# Patient Record
Sex: Female | Born: 1950
Health system: Southern US, Community
[De-identification: ages and names within clinical notes are randomized; demographics above are authoritative.]

## PROBLEM LIST (undated history)

## (undated) DIAGNOSIS — Z923 Personal history of irradiation: Secondary | ICD-10-CM

## (undated) DIAGNOSIS — K8020 Calculus of gallbladder without cholecystitis without obstruction: Secondary | ICD-10-CM

## (undated) DIAGNOSIS — Z860100 Personal history of colon polyps, unspecified: Secondary | ICD-10-CM

## (undated) DIAGNOSIS — Z803 Family history of malignant neoplasm of breast: Secondary | ICD-10-CM

## (undated) DIAGNOSIS — D126 Benign neoplasm of colon, unspecified: Secondary | ICD-10-CM

## (undated) DIAGNOSIS — D333 Benign neoplasm of cranial nerves: Secondary | ICD-10-CM

## (undated) DIAGNOSIS — K222 Esophageal obstruction: Secondary | ICD-10-CM

## (undated) DIAGNOSIS — M1990 Unspecified osteoarthritis, unspecified site: Secondary | ICD-10-CM

## (undated) DIAGNOSIS — N390 Urinary tract infection, site not specified: Secondary | ICD-10-CM

## (undated) DIAGNOSIS — K449 Diaphragmatic hernia without obstruction or gangrene: Secondary | ICD-10-CM

## (undated) DIAGNOSIS — Z8041 Family history of malignant neoplasm of ovary: Secondary | ICD-10-CM

## (undated) DIAGNOSIS — C801 Malignant (primary) neoplasm, unspecified: Secondary | ICD-10-CM

## (undated) DIAGNOSIS — Z800 Family history of malignant neoplasm of digestive organs: Secondary | ICD-10-CM

## (undated) DIAGNOSIS — K219 Gastro-esophageal reflux disease without esophagitis: Secondary | ICD-10-CM

## (undated) DIAGNOSIS — C50919 Malignant neoplasm of unspecified site of unspecified female breast: Secondary | ICD-10-CM

## (undated) DIAGNOSIS — Z8042 Family history of malignant neoplasm of prostate: Secondary | ICD-10-CM

## (undated) HISTORY — DX: Family history of malignant neoplasm of digestive organs: Z80.0

## (undated) HISTORY — DX: Malignant neoplasm of unspecified site of unspecified female breast: C50.919

## (undated) HISTORY — DX: Family history of malignant neoplasm of ovary: Z80.41

## (undated) HISTORY — PX: TONSILLECTOMY: SUR1361

## (undated) HISTORY — DX: Personal history of colon polyps, unspecified: Z86.0100

## (undated) HISTORY — DX: Malignant (primary) neoplasm, unspecified: C80.1

## (undated) HISTORY — PX: OTHER SURGICAL HISTORY: SHX169

## (undated) HISTORY — DX: Unspecified osteoarthritis, unspecified site: M19.90

## (undated) HISTORY — PX: BREAST LUMPECTOMY: SHX2

## (undated) HISTORY — DX: Calculus of gallbladder without cholecystitis without obstruction: K80.20

## (undated) HISTORY — DX: Benign neoplasm of colon, unspecified: D12.6

## (undated) HISTORY — DX: Personal history of colonic polyps: Z86.010

## (undated) HISTORY — DX: Benign neoplasm of cranial nerves: D33.3

## (undated) HISTORY — DX: Esophageal obstruction: K22.2

## (undated) HISTORY — DX: Gastro-esophageal reflux disease without esophagitis: K21.9

## (undated) HISTORY — DX: Urinary tract infection, site not specified: N39.0

## (undated) HISTORY — DX: Family history of malignant neoplasm of breast: Z80.3

## (undated) HISTORY — DX: Family history of malignant neoplasm of prostate: Z80.42

## (undated) HISTORY — DX: Diaphragmatic hernia without obstruction or gangrene: K44.9

---
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
## 1994-05-25 DIAGNOSIS — C50919 Malignant neoplasm of unspecified site of unspecified female breast: Secondary | ICD-10-CM

## 1994-05-25 HISTORY — PX: PARTIAL HYSTERECTOMY: SHX80

## 1994-05-25 HISTORY — DX: Malignant neoplasm of unspecified site of unspecified female breast: C50.919

## 2000-01-19 ENCOUNTER — Other Ambulatory Visit: Admission: RE | Admit: 2000-01-19 | Discharge: 2000-01-19 | Payer: Self-pay | Admitting: Obstetrics and Gynecology

## 2001-01-19 ENCOUNTER — Other Ambulatory Visit: Admission: RE | Admit: 2001-01-19 | Discharge: 2001-01-19 | Payer: Self-pay | Admitting: Obstetrics and Gynecology

## 2001-05-05 ENCOUNTER — Encounter: Admission: RE | Admit: 2001-05-05 | Discharge: 2001-05-05 | Payer: Self-pay | Admitting: Obstetrics and Gynecology

## 2001-05-05 ENCOUNTER — Encounter: Payer: Self-pay | Admitting: Obstetrics and Gynecology

## 2002-04-14 ENCOUNTER — Other Ambulatory Visit: Admission: RE | Admit: 2002-04-14 | Discharge: 2002-04-14 | Payer: Self-pay | Admitting: Internal Medicine

## 2002-12-24 DIAGNOSIS — D126 Benign neoplasm of colon, unspecified: Secondary | ICD-10-CM

## 2002-12-24 HISTORY — DX: Benign neoplasm of colon, unspecified: D12.6

## 2003-01-22 ENCOUNTER — Encounter: Payer: Self-pay | Admitting: Gastroenterology

## 2003-05-26 HISTORY — PX: SHOULDER SURGERY: SHX246

## 2003-09-27 ENCOUNTER — Ambulatory Visit (HOSPITAL_BASED_OUTPATIENT_CLINIC_OR_DEPARTMENT_OTHER): Admission: RE | Admit: 2003-09-27 | Discharge: 2003-09-27 | Payer: Self-pay | Admitting: Orthopedic Surgery

## 2003-09-27 ENCOUNTER — Ambulatory Visit (HOSPITAL_COMMUNITY): Admission: RE | Admit: 2003-09-27 | Discharge: 2003-09-27 | Payer: Self-pay | Admitting: Orthopedic Surgery

## 2003-12-20 ENCOUNTER — Other Ambulatory Visit: Admission: RE | Admit: 2003-12-20 | Discharge: 2003-12-20 | Payer: Self-pay | Admitting: Internal Medicine

## 2005-04-15 ENCOUNTER — Ambulatory Visit (HOSPITAL_COMMUNITY): Admission: RE | Admit: 2005-04-15 | Discharge: 2005-04-15 | Payer: Self-pay | Admitting: General Surgery

## 2005-04-15 ENCOUNTER — Encounter (INDEPENDENT_AMBULATORY_CARE_PROVIDER_SITE_OTHER): Payer: Self-pay | Admitting: Specialist

## 2005-05-25 HISTORY — PX: HEMORRHOID SURGERY: SHX153

## 2006-01-06 ENCOUNTER — Other Ambulatory Visit: Admission: RE | Admit: 2006-01-06 | Discharge: 2006-01-06 | Payer: Self-pay | Admitting: Internal Medicine

## 2006-05-25 HISTORY — PX: CHOLECYSTECTOMY: SHX55

## 2006-09-01 ENCOUNTER — Ambulatory Visit (HOSPITAL_COMMUNITY): Admission: RE | Admit: 2006-09-01 | Discharge: 2006-09-01 | Payer: Self-pay | Admitting: Internal Medicine

## 2006-09-27 ENCOUNTER — Ambulatory Visit (HOSPITAL_COMMUNITY): Admission: RE | Admit: 2006-09-27 | Discharge: 2006-09-27 | Payer: Self-pay | Admitting: Surgery

## 2006-09-29 ENCOUNTER — Ambulatory Visit: Payer: Self-pay | Admitting: Gastroenterology

## 2006-10-04 ENCOUNTER — Ambulatory Visit: Payer: Self-pay | Admitting: Gastroenterology

## 2006-10-04 ENCOUNTER — Encounter (INDEPENDENT_AMBULATORY_CARE_PROVIDER_SITE_OTHER): Payer: Self-pay | Admitting: Specialist

## 2006-11-08 ENCOUNTER — Ambulatory Visit: Payer: Self-pay | Admitting: Gastroenterology

## 2006-12-16 ENCOUNTER — Ambulatory Visit (HOSPITAL_COMMUNITY): Admission: RE | Admit: 2006-12-16 | Discharge: 2006-12-16 | Payer: Self-pay | Admitting: Surgery

## 2006-12-16 ENCOUNTER — Encounter (INDEPENDENT_AMBULATORY_CARE_PROVIDER_SITE_OTHER): Payer: Self-pay | Admitting: Surgery

## 2007-10-07 DIAGNOSIS — C50412 Malignant neoplasm of upper-outer quadrant of left female breast: Secondary | ICD-10-CM

## 2007-10-07 DIAGNOSIS — K649 Unspecified hemorrhoids: Secondary | ICD-10-CM | POA: Insufficient documentation

## 2007-10-07 DIAGNOSIS — K2990 Gastroduodenitis, unspecified, without bleeding: Secondary | ICD-10-CM

## 2007-10-07 DIAGNOSIS — D126 Benign neoplasm of colon, unspecified: Secondary | ICD-10-CM | POA: Insufficient documentation

## 2007-10-07 DIAGNOSIS — K2970 Gastritis, unspecified, without bleeding: Secondary | ICD-10-CM | POA: Insufficient documentation

## 2007-10-07 DIAGNOSIS — K4090 Unilateral inguinal hernia, without obstruction or gangrene, not specified as recurrent: Secondary | ICD-10-CM | POA: Insufficient documentation

## 2007-10-07 DIAGNOSIS — K8020 Calculus of gallbladder without cholecystitis without obstruction: Secondary | ICD-10-CM | POA: Insufficient documentation

## 2007-10-07 DIAGNOSIS — K219 Gastro-esophageal reflux disease without esophagitis: Secondary | ICD-10-CM

## 2007-10-07 DIAGNOSIS — M129 Arthropathy, unspecified: Secondary | ICD-10-CM | POA: Insufficient documentation

## 2007-10-07 DIAGNOSIS — Z171 Estrogen receptor negative status [ER-]: Secondary | ICD-10-CM

## 2008-09-01 ENCOUNTER — Emergency Department (HOSPITAL_COMMUNITY): Admission: EM | Admit: 2008-09-01 | Discharge: 2008-09-01 | Payer: Self-pay | Admitting: Emergency Medicine

## 2010-06-15 ENCOUNTER — Encounter: Payer: Self-pay | Admitting: Radiology

## 2010-06-15 ENCOUNTER — Encounter: Payer: Self-pay | Admitting: Surgery

## 2010-10-07 NOTE — Assessment & Plan Note (Signed)
[facility]                         GASTROENTEROLOGY OFFICE NOTE   NAME:Stephanie Bautista, Stephanie Bautista                       MRN:          [id number]  DATE:11/08/2006                            DOB:          04-Dec-1950    Ms. Stephanie Bautista returns today noting some improvement in her reflux symptoms  but she does not feel her symptoms are under very good control.  She has  significant gas and bloating, this has been associated with flatulence  and intermittent belching.  She does note that the belching has however  improved.  She is still having chest pain which is being further  evaluated by her primary care physician, Dr. Marisue Brooklyn.   CURRENT MEDICATIONS:  1. Prevacid 30 mg q.a.m.  2. Ex-Lax p.r.n.  3. Gas-X t.i.d. p.r.n.  4. Tums t.i.d. p.r.n.   MEDICATION ALLERGIES:  None known.   EXAM:  No acute distress.  Weight 136.2 pounds, blood pressure 110/68,  pulse 60 and regular.  She is not re-examined.   ASSESSMENT AND PLAN:  1. Gastroesophageal reflux disease.  Symptoms only under fair control.      Re-intensify anti-reflux measures and increase Prevacid to 30 mg      p.o. b.i.d.  Return office visit in approximately 6 weeks.  2. Gas, bloating, flatulence and belching.  She is given instructions      on a low-gas diet.  Trial of Xifaxan 400 mg t.i.d. for 10 days.      Begin Align 1 daily x10 days.  She is advised to avoid carbonated      beverages and high-fiber foods which tend to exacerbate her      symptoms.  Return office visit in 6 weeks.  3. Cholelithiasis.  This is still a potential source of her symptoms      and if the above measures are not successful in controlling her      complaints, she will return to Dr. Michaell Cowing for consideration of      cholecystectomy.     Venita Lick. Russella Dar, MD, [facility]  Electronically Signed    MTS/MedQ  DD: 11/08/2006  DT: 11/08/2006  Job #: [id number]   cc:   Lovenia Kim, D.O.  Ardeth Sportsman, MD

## 2010-10-07 NOTE — Assessment & Plan Note (Signed)
[facility]                         GASTROENTEROLOGY OFFICE NOTE   NAME:SHUMATESophiah, Stephanie                       MRN:          [id number]  DATE:10/04/2006                            DOB:          02-02-1951    REFERRING PHYSICIAN:  Ardeth Sportsman, MD   REASON FOR REFERRAL:  Chest pain, belching, gas, bloating, and reflux  symptoms.   HISTORY OF PRESENT ILLNESS:  Stephanie Bautista is a 60 year old white female  who I have seen in the past.  She describes worsening chest pain when  she lies down.  The pain is located along her right back, right chest,  and right anterior chest wall, and it radiates to the mid sternal  region.  She has had ongoing problems with gas and bloating.  She notes  her symptoms are worsening and have not responded to Protonix.  She  notes no dysphagia, odynophagia, or change in bowel habits.  She has  mild chronic constipation that has been relatively stable.  She  underwent upper endoscopy previously in February, 2005 that was normal,  and she was treated for GERD at that time.  She had a colonoscopy in  August, 2004 that showed internal and external hemorrhoids as well as  small adenomatous polyps, which were removed.   She has recently been evaluated by Dr. Michaell Cowing, and an abdominal  ultrasound was performed on April 9th that showed cholelithiasis with no  other abnormalities.  The gastric emptying scan was performed on May 5th  that showed normal gastric emptying with 14.5% retention at 120 minutes  with normal being less than 30%.   PAST MEDICAL HISTORY:  1. Right breast cancer, status post lumpectomy.  2. Status post inguinal hernia repair in 1968.  3. Status post hemorrhoidectomy in 2007.  4. Status post partial hysterectomy in 1996.  5. Arthritis.  6. GERD.  7. Adenomatous colon polyps.   CURRENT MEDICATIONS:  1. Ex-Lax p.r.n.  2. Tums p.r.n.  3. Gas-X p.r.n.   MEDICATION ALLERGIES:  None known.   SOCIAL HISTORY:   Per the handwritten form.   REVIEW OF SYSTEMS:  Per the handwritten form.   PHYSICAL EXAMINATION:  GENERAL:  Well-developed and well-nourished white  female in no acute distress.  VITAL SIGNS:  Height 5 feet, 3-1/2 inches.  Weight 137.2 pounds.  Blood  pressure 126/70.  Pulse 72 and regular.  HEENT:  Anicteric sclerae.  Oropharynx clear.  CHEST:  Clear to auscultation with some chest wall tenderness located  along the right mid thoracic spine and the scapular area.  No anterior  chest wall tenderness.  Chest clear to auscultation bilaterally.  CARDIAC:  Regular rate and rhythm without murmurs.  ABDOMEN:  Soft, nontender, nondistended.  Normoactive bowel sounds.  No  palpable organomegaly, masses, or hernias.  NEUROLOGIC:  Alert and oriented x3.  Grossly nonfocal.   ASSESSMENT/PLAN:  1. Right back pain and right chest pain:  The features are not typical      for a gastrointestinal process.  I wonder whether she has thoracic      disk disease or another thoracic  spine disorder leading to      radicular pain or muscle spasms in this area.  Defer further      evaluation to Dr. Marisue Brooklyn.  2. Gas, bloating and reflux symptoms:  She is advised to begin      omeprazole 20 mg p.o. q.a.m. along with a standard antireflux diet      and a low gas diet.  She may use Gas-X and Tums p.r.n.  Consider a      trial of antibiotics for possible bacterial overgrowth.  Risks,      benefits and alternatives to upper endoscopy with possible biopsy      discussed with the patient, and she consents to proceed.  This will      be scheduled electively.  3. Cholelithiasis:  It is not entirely clear that her symptoms are      biliary; however, this may be causing some of her symptoms.      Further followup with Dr. Michaell Cowing for consideration of      cholecystectomy.  4. Personal history of adenomatous colon polyps.  Repeat colonoscopy      recommended for August, 2009.     Venita Lick. Russella Dar, MD, [facility]   Electronically Signed    MTS/MedQ  DD: 10/04/2006  DT: 10/05/2006  Job #: [id number]   cc:   Lovenia Kim, D.O.  Ardeth Sportsman, MD

## 2010-10-07 NOTE — Op Note (Signed)
NAMEEMMALENA, CANNY                ACCOUNT NO.:  [id number]   MEDICAL RECORD NO.:  [id number]          PATIENT TYPE:  AMB   LOCATION:  SDS                          FACILITY:  [facility]   PHYSICIAN:  Ardeth Sportsman, MD     DATE OF BIRTH:  08/09/1950   DATE OF PROCEDURE:  12/16/2006  DATE OF DISCHARGE:                               OPERATIVE REPORT   PRIMARY CARE PHYSICIAN:  Marisue Brooklyn, M.D.   GASTROENTEROLOGIST:  Venita Lick. Russella Dar, M.D.   SURGEON:  Karie Soda, M.D.   PREOPERATIVE DIAGNOSIS:  Abdominal pain with symptomatic  cholecystolithiasis.   POSTOPERATIVE DIAGNOSIS:  1. Cholecystolithiasis.  2. Chronic cholecystitis.   PROCEDURE PERFORMED:  Laparoscopic cholecystectomy with intraoperative  cholangiogram.   SURGEON:  Karie Soda, M.D.   ASSISTANT:  None.   ANESTHESIA:  1. General anesthesia.  2. Local anesthetic in a field block around all port sites.   SPECIMEN:  Gallbladder.   DRAINS:  None.   ESTIMATED BLOOD LOSS:  10 mL.   COMPLICATIONS:  None.   COMPLICATIONS:  None apparent.   INDICATIONS:  Ms. Tashiro is a 56-year female with chronic intermittent  abdominal pain which seems to be epigastric, radiating to her back.  She  has a known hiatal hernia but has had her reflux pretty well controlled  on proton pump inhibitors.  She had extensive gastroenterological and  surgical workup including a normal gastric emptying study  and normal  endoscopies with known gallstones.  Given the fact that she had atypical  symptoms, we did not rush the surgery, but after exhausting other  options, I thought it would be reasonable to pursue laparoscopic  cholecystectomy.   Anatomy and physiology of hepatobiliary and pancreatic function along  with foregut digestion  and antireflux mechanism was explained.  Pathophysiology of symptomatic cholecystolithiasis with its risks of  cholecystitis, gallstone pancreatitis, choledocholithiasis and prolonged  pain and  dehydration, etc., were discussed.  Options were discussed and  recommendations made for laparoscopic cholecystectomy with  intraoperative cholangiogram.  Risks such as stroke, heart attack and  deep venous thrombosis, pulmonary embolism and death were discussed.  Risks such as bleeding, need for transfusion, wound infection, abscess,  injury to other organs, prolonged pain, persistent pain (arguing that  the gallbladder is not the true etiology of pain) were discussed.  Risk  of bile duct injury resulting in need of operative reconstruction,  endoscopic stenting, percutaneous drainage, and other risks were  discussed.  Questions answered.  She agreed to proceed.   OPERATIVE FINDINGS:  She had a small shrunken gallbladder with  gallbladder wall thickening and omental and duodenal attachment  suspicious for cholecystitis.  Her cholangiogram was normal.   DESCRIPTION OF PROCEDURE:  Informed consent was confirmed.  The patient  urinated just prior to going to the operating room.  She underwent  general anesthesia without difficulty.  She was positioned supine, both  arms tucked.  She had sequential compression devices active during  entire case.  Her abdomen was prepped, draped in sterile fashion.   Entry was gained in the abdomen with the patient  in steep reversed  Trendelenburg and right-side up using optical entry with a 5-mm/0-degree  scope.  Capnoperitoneum 15 mmHg provided good abdominal insufflation.  Under direct visualization, 5-mm ports were placed in the right flank  and through the umbilicus.  A 10-mm port was tunneled through the  subxiphoid port.   Gallbladder fundus was grasped and elevated anteriorly.  Obvious  attachments received with omentum and duodenum.  These were freed off  using controlled cautery and especially just simple sharp dissection  with cold scissors to help free the duodenum off of the gallbladder.  This helped better expose the infundibulum.   Inspection of the duodenum  revealed no injury, and there was no injury to any other structures.  Gallbladder was able to be elevated more anteriorly to expose the  infundibulum.   The peritoneal coverings between the gallbladder and the liver were  freed off from the anteromedial posterior lateral aspects.  During  dissection, there was some breach into the gallbladder wall and spillage  of clear bile.  This was aspirated.  There was no spillage of stones.  Circumferential dissection was done to free the proximal half of the  gallbladder off the liver bed and dissection around the infundibulum.  Two structures were noted going from the gallbladder down to the porta  hepatis consistent the cystic artery and on the anteromedial wall and  the cystic duct leaving from the infundibulum consistent with a classic  critical view.  Two clips on the cystic artery and one clips slightly  proximal on the gallbladder side were done, and the cystic artery was  transected.  The cystic was further skeletonized.  A clip was placed on  the infundibulum.  A partial cyst ductotomy was done at the  infundibulum/cystic duct junction.  A 5-French cholangiocatheter was  placed through a right subxiphoid stab incision, flushed, and was able  to cannulate into this partial opening.  However, the catheter could not  be advanced.  Further dissection revealed what looked like a mid cystic  duct stenosis.  A partial cyst ductotomy was done just slightly proximal  to this and got clear bile back.  I was able to get the  cholangiocatheter to advance more proximally towards the common bile  duct.  It flushed well with no leak.   Cholangiogram was run using dilute radio-opaque contrast and continuous  fluoroscopy.  Contrast flowed from a side branch consistent with cystic  duct cannulization.  Contrast flowed well up into the right and left  intrahepatic chains across the common hepatic and bile duct, across a  smooth  ampule into normal duodenum consistent with a normal  cholangiogram.  Cholangiocatheter was removed.  Three clips were placed  on the cystic duct just proximal to the partial cyst ductotomy, and  cystic duct transection was completed.  Given the short stump, I placed  a 0 PDS Endoloop just slightly proximally to the third clips, staying  away from the cystic duct/common bile duct junction.  This brought good  cystic duct stump occlusion.   Gallbladder was freed from its remaining attachments on the liver bed.  There was a small venous bleeder on the gallbladder fossa, but this was  able to be controlled with focused cautery.  Gallbladder was placed  inside an EndoCatch bag and removed out the subxiphoid port with no  dilation required.  Copious irrigation was performed with clear return.  Inspection was made on the liver bed and on cystic duct and arterial  stumps.  There was no evidence of bleeding.  There was no bile leak.  Upper 3 abdominal ports were removed with no evidence of bleeding on the  skin or peritoneal side.  The fascial defect of the subxiphoid port was  too small to allow my pinky to pass, and I felt it did not require  anymore aggressive fascial closure.  Capnoperitoneum was evacuated.  Umbilical port was removed.  Skin was closed using 4-0 Monocryl stitch.  Sterile dressings applied.   The patient was extubated and sent to recovery room in stable condition.  I explained the operative findings to the patient's husband.  Questions  were answered, and he expressed understanding and appreciation.  I had  also discussed postoperative recovery issues with the patient just prior  to surgery as well.      Ardeth Sportsman, MD  Electronically Signed     SCG/MEDQ  D:  12/16/2006  T:  12/16/2006  Job:  [id number]   cc:   Lovenia Kim, D.O.  Venita Lick. Russella Dar, MD, [facility]  Ardeth Sportsman, MD

## 2010-10-10 NOTE — Op Note (Signed)
Stephanie Bautista, CAMPION                ACCOUNT NO.:  [id number]   MEDICAL RECORD NO.:  [id number]          PATIENT TYPE:  AMB   LOCATION:  DAY                          FACILITY:  [facility]   PHYSICIAN:  Leonie Man, M.D.   DATE OF BIRTH:  [birth year]   DATE OF PROCEDURE:  04/15/2005  DATE OF DISCHARGE:                                 OPERATIVE REPORT   PREOPERATIVE DIAGNOSIS:  Grade 3 and grade 4 hemorrhoidal disease.   POSTOPERATIVE DIAGNOSIS:  Grade 3 and grade 4 hemorrhoidal disease.   PROCEDURE:  Stapled hemorrhoidopexy.   SURGEON:  Leonie Man, MD.   ASSISTANT:  OR tech.   ANESTHESIA:  General.   SPECIMENS TO THE LAB:  Hemorrhoids.   ESTIMATED BLOOD LOSS:  Minimal.   COMPLICATIONS:  No complications on this operation and the patient to the  PACU in good condition.   HISTORY:  Note, [name] is a 60 year old woman presenting with recurrent  pain, bleeding and prolapse of her hemorrhoids which from time to time she  needs to reinsert into the anus and sometimes they go back on their home.  She has been having some bleeding and pain recently and comes now for  stapled hemorrhoidopexy for this. The risks and potential benefits of  surgery have been discussed with her, all questions answered and consent  obtained for surgery.   DESCRIPTION OF PROCEDURE:  Following the induction of satisfactory general  anesthesia, the patient is then turned into the prone jackknife position,  buttock cheeks pulled apart and held with tapes. The perianal tissues as  well as the vaginal vault are prepped and draped to be included in the  sterile operative field. I infiltrated the perianal tissues with 0.25%  Marcaine with epinephrine and Wydase. The anal sphincter was dilated up to  approximately 3 fingerbreadth's and operating anoscope placed inside the  anus. Approximately 5 cm above the dentate line, I placed a 2-0 Prolene  suture circumferentially around the mucosa of the anus and then  pulled the  pursestring suture tight so as to test the pursestring. A finger was  inserted in the vagina to make sure that the rectovaginal septum was not  included out. I inserted the hemorrhoidal stapler with the anvil above the  pursestring suture line and tied this down taut. The stapler was then closed  down to a size of 4 cm and then held for approximately 1 minute to assure  hemostasis. The stapler was then deployed. I held the stapler again in place  for an additional 30 seconds so as to assure hemostasis. The stapler was  then removed, a complete ring of mucosa above the hemorrhoidal pads were  removed in its entirety. The operating anoscope was again placed into the  anus and I hemostasis was assured with  electrocautery. I then used a Gelfoam roll to placed over the staple line,  removed with tapes and placed sterile dressings on the wound after sponge  and instrument counts were fully verified. The patient was then returned to  the PACU in excellent condition having tolerated the procedure well.  Leonie Man, M.D.  Electronically Signed     PB/MEDQ  D:  04/15/2005  T:  04/15/2005  Job:  [id number]

## 2010-10-10 NOTE — Op Note (Signed)
NAME:  Stephanie Bautista, Stephanie Bautista                          ACCOUNT NO.:  [id number]   MEDICAL RECORD NO.:  [id number]                   PATIENT TYPE:  AMB   LOCATION:  DSC                                  FACILITY:  [facility]   PHYSICIAN:  Loreta Ave, M.D.              DATE OF BIRTH:  06/03/1950   DATE OF PROCEDURE:  09/27/2003  DATE OF DISCHARGE:                                 OPERATIVE REPORT   PREOPERATIVE DIAGNOSIS:  Chronic impingement right shoulder with full  thickness tear rotator cuff, distal clavicle osteolysis chondromalacia.   POSTOPERATIVE DIAGNOSIS:  Chronic impingement right shoulder with full  thickness tear rotator cuff, distal clavicle osteolysis chondromalacia.   OPERATIVE PROCEDURE:  Right shoulder exam under anesthesia, arthroscopy,  debridement of superior labral tear, bursectomy, acromioplasty,  coracoacromial ligament release, excision distal clavicle, mini-open repair  rotator cuff tear with FiberWire suture and Concept repair system.   SURGEON:  Loreta Ave, M.D.   ASSISTANT:  Arlys John D. Petrarca, P.A.-C.   ANESTHESIA:  General.   ESTIMATED BLOOD LOSS:  Minimal.   SPECIMENS:  None.   CULTURES:  None.   COMPLICATIONS:  None.   DRESSINGS:  Soft compressive with shoulder immobilizer.   PROCEDURE:  The patient was brought to the operating room and after adequate  anesthesia had been obtained, the right shoulder was examined.  Full motion,  good stability.  She was placed in a beach chair position on the shoulder  positioner, prepped and draped in the usual sterile fashion.  Three standard  portals, anterior, posterior, and lateral.  The shoulder was entered with a  blunt obturator, distended, and inspected.  Roughening tearing anterior and  superior labrum debrided.  Biceps tendon well anchored.  Articular cartilage  and other ligamentous structures intact.  The under surface of the cuff was  thinned but I did not see a hole all the way through this.  The  cannula was  redirected subacromially.  Confirmation of functional full thickness tear  supraspinatus tendon extending from just behind the biceps almost all the  way back.  Marked attrition, roughening of the entire top of the cuff from  impingement.  The cuff debrided. I confirmed that there was a functional  full thickness tear requiring repair.  Type 2 acromion.  After resecting the  bursa and debriding the cuff, acromioplasty was achieved with shaver and  high speed bur releasing the CA ligament with cautery.  The distal clavicle  had spurs and grade 3 changes and some osteolysis.  A lateral cm was sharply  dissected.  Adequacy of decompression of the clavicle excision confirmed  viewing from all portals.  The instruments and fluids were removed.  The  lateral portal opened up into a deltoid splitting incision showing  subacromial space.  Adequacy of decompression confirmed.  The cuff was  debrided back to healthy tissue leaving a 5-6 mm gap at the area of  thinning  and full thickness tear.  The cuff was then mobilized and well captured with  two FiberWire sutures weaved into the cuff.  The tuberosity was roughened  and drill holes were placed into the tuberosity with the Concept Repair  System.  FiberWire sutures were weaved through drill holes and then firmly  tied down over a bony bridge and lateral cortex humerus.  This yielded a  nice, firm, watertight closure of the cuff without undue tension even  through full passive motion.  All impingement obliterated with previous  acromioplasty.  The wound was irrigated.  The deltoid was closed with  Vicryl.  The skin and subcutaneous tissue with Vicryl.  The portals were  closed with nylon.  The margins of the wound and the shoulder injected with  Marcaine.  A sterile compressive dressing and shoulder immobilizer applied.  Anesthesia reversed.  Brought to the recovery room.  Tolerated the surgery  well without complications.                                                Loreta Ave, M.D.    DFM/MEDQ  D:  09/27/2003  T:  09/27/2003  Job:  [id number]

## 2010-12-25 ENCOUNTER — Other Ambulatory Visit (HOSPITAL_COMMUNITY)
Admission: RE | Admit: 2010-12-25 | Discharge: 2010-12-25 | Disposition: A | Discharge status: Home / Self Care | Payer: 59 | Source: Ambulatory Visit | Attending: Internal Medicine | Admitting: Internal Medicine

## 2010-12-25 ENCOUNTER — Other Ambulatory Visit: Payer: Self-pay | Admitting: Internal Medicine

## 2010-12-25 DIAGNOSIS — Z01419 Encounter for gynecological examination (general) (routine) without abnormal findings: Secondary | ICD-10-CM | POA: Insufficient documentation

## 2011-03-09 LAB — CBC
MCV: 84.2
RBC: 4.71
WBC: 4.2

## 2011-04-01 ENCOUNTER — Encounter: Payer: Self-pay | Admitting: Gastroenterology

## 2011-04-01 ENCOUNTER — Telehealth: Payer: Self-pay

## 2011-04-01 NOTE — Telephone Encounter (Signed)
Patient hasn't been seen in the office since 2008.  She is requesting a rx for nexium that she has been on in the past.  She is having an increase in reflux and requesting a rx.  I have advised that she can try prilosec OTC and have moved up her appt with Dr Russella Dar to 04/13/11 from 04/21/11.

## 2011-04-13 ENCOUNTER — Encounter: Payer: Self-pay | Admitting: Gastroenterology

## 2011-04-13 ENCOUNTER — Ambulatory Visit (INDEPENDENT_AMBULATORY_CARE_PROVIDER_SITE_OTHER): Payer: 59 | Admitting: Gastroenterology

## 2011-04-13 VITALS — BP 110/80 | HR 82 | Ht 63.0 in | Wt 136.0 lb

## 2011-04-13 DIAGNOSIS — Z86010 Personal history of colonic polyps: Secondary | ICD-10-CM

## 2011-04-13 DIAGNOSIS — K219 Gastro-esophageal reflux disease without esophagitis: Secondary | ICD-10-CM

## 2011-04-13 MED ORDER — OMEPRAZOLE 20 MG PO CPDR: 20.0000 mg | DELAYED_RELEASE_CAPSULE | Freq: Every day | ORAL | Status: DC

## 2011-04-13 NOTE — Patient Instructions (Addendum)
Your prescription for Omeprazole has been sent to your pharmacy.  Please call back in December to schedule your recall Colonoscopy for January 2013. Patient advised to avoid spicy, acidic, citrus, chocolate, mints, fruit and fruit juices.  Limit the intake of caffeine, alcohol and Soda.  Don't exercise too soon after eating.  Don't lie down within 3-4 hours of eating.  Elevate the head of your bed.

## 2011-04-13 NOTE — Progress Notes (Signed)
History of Present Illness: This is a 60 year old female who has a history of recurrent reflux problems and has undergone upper endoscopies in 2002, 2005 and 2008. She came off recommended daily PPIs and her reflux symptoms were not bothersome for the past 2 years. About 3 weeks ago she developed significant problems with heartburn, belching, regurgitation, epigastric pain and chest pain on a daily basis. She began taking Prilosec over-the-counter and her symptoms have completely resolved. She also relates difficulties with a nocturnal chest and back pain that has been problematic for the past several months. These symptoms have improved since starting omeprazole. Denies weight loss, constipation, diarrhea, change in stool caliber, melena, hematochezia, nausea, vomiting, reflux symptoms, chest pain.  Review of Systems: Pertinent positive and negative review of systems were noted in the above HPI section. All other review of systems were otherwise negative.  Current Medications, Allergies, Past Medical History, Past Surgical History, Family History and Social History were reviewed in [employer] record.  Physical Exam: General: Well developed , well nourished, no acute distress Head: Normocephalic and atraumatic Eyes:  sclerae anicteric, EOMI Ears: Normal auditory acuity Mouth: No deformity or lesions Neck: Supple, no masses or thyromegaly Lungs: Clear throughout to auscultation Heart: Regular rate and rhythm; no murmurs, rubs or bruits Abdomen: Soft, non tender and non distended. No masses, hepatosplenomegaly or hernias noted. Normal Bowel sounds Rectal: deferred to colonoscopy Musculoskeletal: Symmetrical with no gross deformities  Skin: No lesions on visible extremities Pulses:  Normal pulses noted Extremities: No clubbing, cyanosis, edema or deformities noted Neurological: Alert oriented x 4, grossly nonfocal Cervical Nodes:  No significant cervical  adenopathy Inguinal Nodes: No significant inguinal adenopathy Psychological:  Alert and cooperative. Normal mood and affect  Assessment and Recommendations:  1. GERD. Continue standard antireflux measures and begin the use of 4 inch bed blocks. Continue omeprazole 20 mg daily for the long-term.  2. Personal history of adenomatous colon polyps, 2004. She did not return for her recommended recall colonoscopy in 2009. She is agreeable to proceeding with colonoscopy but would like to do so in January. The risks, benefits, and alternatives to colonoscopy with possible biopsy and possible polypectomy were discussed with the patient and they consent to proceed.

## 2011-04-15 ENCOUNTER — Encounter: Payer: Self-pay | Admitting: Gastroenterology

## 2011-04-15 ENCOUNTER — Encounter: Payer: Self-pay | Admitting: Internal Medicine

## 2011-04-21 ENCOUNTER — Ambulatory Visit: Payer: 59 | Admitting: Gastroenterology

## 2011-08-12 ENCOUNTER — Ambulatory Visit (INDEPENDENT_AMBULATORY_CARE_PROVIDER_SITE_OTHER): Payer: 59 | Admitting: Internal Medicine

## 2011-08-12 VITALS — BP 138/76 | HR 77 | Temp 98.0°F | Resp 16 | Ht 62.5 in | Wt 125.4 lb

## 2011-08-12 DIAGNOSIS — H905 Unspecified sensorineural hearing loss: Secondary | ICD-10-CM | POA: Insufficient documentation

## 2011-08-12 DIAGNOSIS — H9319 Tinnitus, unspecified ear: Secondary | ICD-10-CM

## 2011-08-12 DIAGNOSIS — IMO0002 Reserved for concepts with insufficient information to code with codable children: Secondary | ICD-10-CM

## 2011-08-12 DIAGNOSIS — H9312 Tinnitus, left ear: Secondary | ICD-10-CM

## 2011-08-12 MED ORDER — PREDNISONE 50 MG PO TABS: ORAL_TABLET | ORAL | Status: AC

## 2011-08-12 NOTE — Assessment & Plan Note (Signed)
Audiometry revealed loss of mid and high frequencies.   Plan to treat with Prednisone and referral to ENT in 24 - 48 hours.

## 2011-08-12 NOTE — Assessment & Plan Note (Signed)
Damaged into nailbed, possibly into nail matrix.  Nothing to remove here in clinic. Recommended to keep toenail covered/gandaged as she continues to injury it when it is unencumbered.   If refuses to heal or starts having problems with other nails, she is to return to clinic in 1 month or so.

## 2011-08-12 NOTE — Assessment & Plan Note (Signed)
Audiometry in clinic revealed:  Possibly eustachian tube dysfunction.

## 2011-08-12 NOTE — Patient Instructions (Addendum)
We are referring you to Ear Nose and Throat Doctor tomorrow.   Take the Prednisone 1 pill daily for the next 7 days.   Keep your nail covered.  Hearing Loss A hearing loss is sometimes called deafness. Hearing loss may be partial or total. CAUSES Hearing loss may be caused by:  Wax in the ear canal.   Infection of the ear canal.   Infection of the middle ear.   Trauma to the ear or surrounding area.   Fluid in the middle ear.   A hole in the eardrum (perforated eardrum).   Exposure to loud sounds or music.   Problems with the hearing nerve.   Certain medications.  Hearing loss without wax, infection, or a history of injury may mean that the nerve is involved. Hearing loss with severe dizziness, nausea and vomiting or ringing in the ear may suggest a hearing nerve irritation or problems in the middle or inner ear. If hearing loss is untreated, there is a greater likelihood for residual or permanent hearing loss. DIAGNOSIS A hearing test (audiometry) assesses hearing loss. The audiometry test needs to be performed by a hearing specialist (audiologist). TREATMENT Treatment for recent onset of hearing loss may include:  Ear wax removal.   Medications that kill germs (antibiotics).   Cortisone medications.   Prompt follow up with the appropriate specialist.  Return of hearing depends on the cause of your hearing loss, so proper medical follow-up is important. Some hearing loss may not be reversible, and a caregiver should discuss care and treatment options with you. SEEK MEDICAL CARE IF:   You have a severe headache, dizziness, or changes in vision.   You have new or increased weakness.   You develop repeated vomiting or other serious medical problems.   You have a fever.  Document Released: 05/11/2005 Document Revised: 04/30/2011 Document Reviewed: 09/05/2009 [facility] Patient Information 2012 [place], [place].

## 2011-08-12 NOTE — Progress Notes (Signed)
  Subjective:    Patient ID: Roderick B Wiland, female    DOB: 1950/06/30, 61 y.o.   MRN: [id number]  HPI 1.  Left sided ear fullness:  Started acutely Monday, not sure exact time. Persistent since that time.  Describes "fullness" such as when "my ears want to pop but cannot."  Also describes tinnitus in that ear described as "buzzing" sound.  No actual ear pain.  Notes that evening that she had loss of hearing when she is lying on Right side, could not hear her husband talking.  She has had no recent URI or other illnesses.  Does state she had had gradual decrease in hearing for past 5 - 8 years, but believes this to be equal and symmetric.  No dizziness, no falls, no pre-syncopal or syncopal symptoms.  No nausea or vomiting, no vertiginous symptoms, no lightheadedness.    2.  Left 5th toenail injury:  Split left fifth toenail several weeks ago.  Unsure exactly when, no exact inciting injury.  Split deep into the quick.  She has been filing nail down to help with this.  Aggravated when putting on socks or when toenails become snagged on carpet.  No other nail problems on feet or hands.     Review of Systems No fevers or chills.  No vestibular symptoms.      Objective:   Physical Exam  BP 138/76  Pulse 77  Temp(Src) 98 F (36.7 C) (Oral)  Resp 16  Ht 5' 2.5" (1.588 m)  Wt 125 lb 6.4 oz (56.881 kg)  BMI 22.57 kg/m2 Gen:  Patient sitting on exam table, appears stated age in no acute distress Head: Normocephalic atraumatic Eyes: EOMI, PERRL, sclera and conjunctiva non-erythematous Nose:  Nasal turbinates non-erythematous and non-edemaouts BL Ears:  External pinna WNL BL.  Bilateral ear canals clear with mild cerumen and no erythema or swelling.  BL TM's pearly gray with good landmarks beyond.  No TM erythema BL.   Mouth: Mucosa membranes moist. Tonsils +2, nonenlarged, non-erythematous. Neck: No cervical lymphadenopathy noted Heart:  RRR, no murmurs auscultated. Pulm:  Clear to auscultation  bilaterally with good air movement.  No wheezes or rales noted.   Toes:  Let 5th toe with hairline split in nailbed extending into base of nail on lateral aspect of nail.  No erythema.  She has filed this down to only 3-4 mm about nail matrix.  There is nothing for [place] to clip or remove.       Assessment & Plan:

## 2011-11-16 ENCOUNTER — Ambulatory Visit (INDEPENDENT_AMBULATORY_CARE_PROVIDER_SITE_OTHER): Payer: 59 | Admitting: Family Medicine

## 2011-11-16 ENCOUNTER — Ambulatory Visit
Admission: RE | Admit: 2011-11-16 | Discharge: 2011-11-16 | Disposition: A | Discharge status: Home / Self Care | Payer: 59 | Source: Ambulatory Visit | Attending: Family Medicine | Admitting: Family Medicine

## 2011-11-16 VITALS — BP 140/70 | HR 63 | Temp 98.1°F | Resp 16 | Ht 63.5 in | Wt 118.4 lb

## 2011-11-16 DIAGNOSIS — R42 Dizziness and giddiness: Secondary | ICD-10-CM

## 2011-11-16 DIAGNOSIS — R110 Nausea: Secondary | ICD-10-CM

## 2011-11-16 LAB — POCT CBC
HCT, POC: 42.9 % (ref 37.7–47.9)
Hemoglobin: 14.3 g/dL (ref 12.2–16.2)
Lymph, poc: 1.3 (ref 0.6–3.4)
MCHC: 33.3 g/dL (ref 31.8–35.4)
MCV: 87.1 fL (ref 80–97)
POC LYMPH PERCENT: 14.8 %L (ref 10–50)
RDW, POC: 12.9 %
WBC: 8.8 10*3/uL (ref 4.6–10.2)

## 2011-11-16 LAB — BASIC METABOLIC PANEL
Calcium: 9.5 mg/dL (ref 8.4–10.5)
Sodium: 139 mEq/L (ref 135–145)

## 2011-11-16 LAB — TSH: TSH: 1.036 u[IU]/mL (ref 0.350–4.500)

## 2011-11-16 MED ORDER — ONDANSETRON 4 MG PO TBDP
4.0000 mg | ORAL_TABLET | Freq: Once | ORAL | Status: AC
  Administered 2011-11-16: 4 mg via ORAL

## 2011-11-16 MED ORDER — DIAZEPAM 5 MG PO TABS: 5.0000 mg | ORAL_TABLET | Freq: Every evening | ORAL | Status: AC | PRN

## 2011-11-16 MED ORDER — MECLIZINE HCL 25 MG PO TABS: 25.0000 mg | ORAL_TABLET | Freq: Three times a day (TID) | ORAL | Status: AC | PRN

## 2011-11-16 MED ORDER — ONDANSETRON 4 MG PO TBDP: 4.0000 mg | ORAL_TABLET | Freq: Three times a day (TID) | ORAL | Status: AC | PRN

## 2011-11-16 NOTE — Patient Instructions (Signed)
We will schedule a ct scan today, and you should receive a call about the results today.  Meclizine every 8 hours as needed, zofran up to every 8 hours as needed for nausea, and valium at night if needed for vertigo. If your symptoms are not improving in the next few days - return for recheck. Return to the clinic or go to the nearest emergency room if any of your symptoms worsen or new symptoms occur. Your should receive a call or letter about your lab results within the next week to 10 days.

## 2011-11-16 NOTE — Progress Notes (Signed)
Subjective:    Patient ID: Marcella B Demartin, female    DOB: 06/02/1950, 61 y.o.   MRN: [id number]  HPI Sierria B Elbe is a 61 y.o. female Woke up dizzy - 4 days ago.  Took meclizine - wore off by lunchtime.  Notes when lying down, then sitting up - continued past few days - seemed to be better by yesterday, but worse this am.  Feels like room spinning. Nausea today.  Worse with turning to left.  No vomiting.  No arm or leg weakness, no face weakness, no slurred speech. Slight frontal headache, no new chest pain (has pain in chest with lying flat, lumpectomy and radiation for breast CA in 1996), no palpitations.  Hx of vertigo - years ago.  Had hearing sensitivity then - seen by ENT, had some hearing loss.    Recently put on estrogen by gynecologist - 6 days ago, but stopped after 1 dose.    FH: father with CVA in 70's, but diabetic.   Tx: meclizine 1 per day past few days.   Review of Systems  Respiratory: Negative for chest tightness and shortness of breath.   Neurological: Positive for dizziness and headaches. Negative for syncope, facial asymmetry, speech difficulty and weakness.       Objective:   Physical Exam  Constitutional: She is oriented to person, place, and time. She appears well-developed and well-nourished.  HENT:  Head: Normocephalic and atraumatic.  Right Ear: External ear normal.  Left Ear: External ear normal.  Mouth/Throat: Oropharynx is clear and moist. No oropharyngeal exudate.  Eyes: Pupils are equal, round, and reactive to light. Right eye exhibits nystagmus. Left eye exhibits nystagmus.  Neck: JVD present.  Cardiovascular: Normal rate, regular rhythm, normal heart sounds and intact distal pulses.   No murmur heard. Pulmonary/Chest: Effort normal and breath sounds normal.  Neurological: She is alert and oriented to person, place, and time. She has normal strength. She is not disoriented. She displays no tremor. No cranial nerve deficit or sensory deficit. She  exhibits normal muscle tone. Gait abnormal.       Unsteady with romberg, and heel to toe - unable to balance.   Skin: Skin is warm and dry. No rash noted.  Psychiatric: She has a normal mood and affect. Her behavior is normal.  Blood pressure 140/70, pulse 63, temperature 98.1 F (36.7 C), temperature source Oral, resp. rate 16, height 5' 3.5" (1.613 m), weight 118 lb 6.4 oz (53.706 kg).  EKG : Sr, no acute findings.  Results for orders placed in visit on 11/16/11  POCT CBC      Component Value Range   WBC 8.8  4.6 - 10.2 K/uL   Lymph, poc 1.3  0.6 - 3.4   POC LYMPH PERCENT 14.8  10 - 50 %L   MID (cbc) 0.3  0 - 0.9   POC MID % 3.3  0 - 12 %M   POC Granulocyte 7.2 (*) 2 - 6.9   Granulocyte percent 81.9 (*) 37 - 80 %G   RBC 4.93  4.04 - 5.48 M/uL   Hemoglobin 14.3  12.2 - 16.2 g/dL   HCT, POC 40.9  81.1 - 47.9 %   MCV 87.1  80 - 97 fL   MCH, POC 29.0  27 - 31.2 pg   MCHC 33.3  31.8 - 35.4 g/dL   RDW, POC 91.4     Platelet Count, POC 212  142 - 424 K/uL   MPV 10.0  0 -  99.8 fL       Assessment & Plan:  Melessia B Reader is a 61 y.o. female 1. Dizziness  Basic metabolic panel, TSH, POCT CBC, EKG 12-Lead  2. Vertigo  Basic metabolic panel, TSH, POCT CBC, EKG 12-Lead  3. Nausea  ondansetron (ZOFRAN-ODT) disintegrating tablet 4 mg  4. Frontal headache      Suspected recurrence of vertigo. No foacal wekaness, but with worsening today - check CT head without contrast today.  If negative - continue meclizine - every 8 hours as needed, add zofran 4mg  Q 8hrs prn nausea,  Valium 5mg  qhs prn., push fluids, and recheck if not improved in next few days.  Consider neuro eval or MRI if sx's not clearing. Check BMP, TSH.   1700 addendum. CT witout any acute findings - left message on both contact numbers for results.  Sx care and rtc precautions as above.

## 2011-11-19 ENCOUNTER — Telehealth: Payer: Self-pay | Admitting: Radiology

## 2011-11-19 ENCOUNTER — Telehealth (INDEPENDENT_AMBULATORY_CARE_PROVIDER_SITE_OTHER): Payer: Self-pay | Admitting: General Surgery

## 2011-11-19 NOTE — Telephone Encounter (Signed)
Message copied by Luretha Murphy on Thu Nov 19, 2011  9:41 AM ------      Message from: [place], [place] R      Created: Mon Nov 16, 2011  5:17 PM       Left message to call back:      When calls - advise that head CT negative/ no acute abnormalities. Can take meds as discussed in office, follow up if not improving next few days.

## 2011-11-19 NOTE — Telephone Encounter (Signed)
Dr.Neal calling for Dr. Jamey Ripa, who is on vacation.  He is requesting records for this pt---of path report and any notes on her therapy.  Records at in storage, so requisitioned to be brought in to office.  Called Dr. Donnetta Hail nurse, Selena Batten, to let her know this and will try to FAX her notes when they are available (FAX: [phone].)  She will let Dr. Jennette Kettle know.

## 2011-11-21 NOTE — Telephone Encounter (Signed)
Agree with rtc advice.

## 2011-11-21 NOTE — Telephone Encounter (Signed)
LMOM advising patient RTC if still dizzy.

## 2011-11-25 ENCOUNTER — Ambulatory Visit (INDEPENDENT_AMBULATORY_CARE_PROVIDER_SITE_OTHER): Payer: 59 | Admitting: Family Medicine

## 2011-11-25 ENCOUNTER — Encounter: Payer: Self-pay | Admitting: Family Medicine

## 2011-11-25 VITALS — BP 114/70 | HR 80 | Temp 98.6°F | Resp 16 | Ht 62.5 in | Wt 117.8 lb

## 2011-11-25 DIAGNOSIS — H9319 Tinnitus, unspecified ear: Secondary | ICD-10-CM

## 2011-11-25 DIAGNOSIS — H538 Other visual disturbances: Secondary | ICD-10-CM

## 2011-11-25 DIAGNOSIS — R42 Dizziness and giddiness: Secondary | ICD-10-CM

## 2011-11-25 DIAGNOSIS — H9190 Unspecified hearing loss, unspecified ear: Secondary | ICD-10-CM

## 2011-11-25 NOTE — Progress Notes (Signed)
Subjective:    Patient ID: Stephanie Bautista, female    DOB: 1950/12/28, 60 y.o.   MRN: [id number]  Stephanie Bautista is a 61 y.o. female See 11/19/11 OV - Woke up dizzy - 4 days prior. Worse that am.  Felt  like room spinning. Nausea.  Worse with turning to left.  No vomiting.  No arm or leg weakness, no face weakness, no slurred speech. Slight frontal headache, no new chest pain (has pain in chest with lying flat, lumpectomy and radiation for breast CA in 1996), no palpitations. Hx of vertigo - years ago.  Had  hearing sensitivity with some loss 6 months ago seen by ENT then. had some hearing loss.  Suspected recurrence of vertigo. CT without any acute findings. Continued meclizine - every 8 hours as needed, add zofran 4mg  Q 8hrs prn nausea,  Valium 5mg  qhs prn, push fluids. Chronic ringing in ears   Still waking up dizzy after lying flat - noticing more first thing in am.  Improves by 10 or 11, and better in afternoon, unless turning or changing positions quickly.  Last meclizine yesterday - just in am.  Took 1 valium, no improvement.  Tried xanax next night - no relief.  Hydrocodone - took 1/2 pill few nights ago - helped symptoms some.  Less dizzy in am.    Vision blurry and light sensitive at times with dizziness.  Initially felt like room and herself moving - now feels like she is moving.  Vision improves as day goes on. Eyes change frequently - new rx in May. Has been changing Rx every 6 months. Usually blurry vision with vertigo symptoms, current visio symptoms same as prior vertigo symptoms.     drinking 4-5 glasses water per day. Clear to yellow urine.    60% better, but still there.   Review of Systems  Constitutional: Negative for fever and chills.  HENT: Positive for hearing loss and tinnitus. Negative for ear pain.   Eyes: Negative for photophobia.  Neurological: Positive for dizziness. Negative for weakness.       Objective:   Physical Exam  Constitutional: She is oriented to  person, place, and time. She appears well-developed and well-nourished.  HENT:  Head: Normocephalic and atraumatic.  Neck: Carotid bruit is not present.  Cardiovascular: Normal rate, regular rhythm, normal heart sounds and intact distal pulses.   No murmur heard. Pulmonary/Chest: Effort normal.  Musculoskeletal: Normal range of motion.  Neurological: She is alert and oriented to person, place, and time. She has normal strength. She displays no tremor. No cranial nerve deficit or sensory deficit. She displays a negative Romberg sign. Coordination and gait normal.       Heel to toe wnl.  Slight shift with romberg, but self corrects. Normal finger to nose testing.    Skin: Skin is warm and dry.  Psychiatric: She has a normal mood and affect. Her behavior is normal. Judgment and thought content normal.       Assessment & Plan:  Stephanie Bautista is a 61 y.o. female 1. Vertigo   2. Hearing difficulty   3. Tinnitus   4. Blurry vision     With hearing loss prior, tinnitus, and vertigo - DDX includes Meniere's disease. Overall improved - nonfocal exam reassuring.  Cont meclizine, increase fluid intake.  Call ENT to schedule appt in next 2 weeks if possible to discuss possible Meniere's. Discussed MRI to look at posterior brain, with blurry vision, but per patient visual  symptoms typical of vertigo symptoms.  Patient Instructions  Call ENT to schedule appointment. With the vertigo, hearing loss and ringing in the ears - ask them if Meniere's disease is a possibility for your symptoms, and if they would recommend any additional treatment.  Continue meclizine - up to 3 times per day, and if any new or worsening symptoms - return to clinic or be evaluated at an emergency room.       Visual change/blurry symptoms - ?related to vertigo.  CT negative prior.

## 2011-11-25 NOTE — Patient Instructions (Signed)
Call ENT to schedule appointment. With the vertigo, hearing loss and ringing in the ears - ask them if Meniere's disease is a possibility for your symptoms, and if they would recommend any additional treatment.  Continue meclizine - up to 3 times per day, and if any new or worsening symptoms - return to clinic or be evaluated at an emergency room.

## 2012-02-29 ENCOUNTER — Encounter: Payer: Self-pay | Admitting: Gastroenterology

## 2012-05-05 ENCOUNTER — Other Ambulatory Visit: Payer: Self-pay | Admitting: Dermatology

## 2012-07-06 ENCOUNTER — Other Ambulatory Visit: Payer: Self-pay | Admitting: Otolaryngology

## 2012-07-06 DIAGNOSIS — H933X9 Disorders of unspecified acoustic nerve: Secondary | ICD-10-CM

## 2012-07-11 ENCOUNTER — Other Ambulatory Visit: Payer: 59

## 2012-07-11 ENCOUNTER — Ambulatory Visit
Admission: RE | Admit: 2012-07-11 | Discharge: 2012-07-11 | Disposition: A | Discharge status: Home / Self Care | Payer: 59 | Source: Ambulatory Visit | Attending: Otolaryngology | Admitting: Otolaryngology

## 2012-07-11 DIAGNOSIS — H933X9 Disorders of unspecified acoustic nerve: Secondary | ICD-10-CM

## 2012-07-11 MED ORDER — GADOBENATE DIMEGLUMINE 529 MG/ML IV SOLN
12.0000 mL | Freq: Once | INTRAVENOUS | Status: AC | PRN
  Administered 2012-07-11: 12 mL via INTRAVENOUS

## 2012-07-12 ENCOUNTER — Other Ambulatory Visit: Payer: 59

## 2012-12-06 ENCOUNTER — Encounter (INDEPENDENT_AMBULATORY_CARE_PROVIDER_SITE_OTHER): Payer: Self-pay

## 2013-10-10 ENCOUNTER — Ambulatory Visit (INDEPENDENT_AMBULATORY_CARE_PROVIDER_SITE_OTHER): Payer: 59 | Admitting: Physician Assistant

## 2013-10-10 VITALS — BP 132/80 | HR 69 | Temp 98.0°F | Resp 16 | Ht 63.5 in | Wt 149.0 lb

## 2013-10-10 DIAGNOSIS — R229 Localized swelling, mass and lump, unspecified: Secondary | ICD-10-CM

## 2013-10-10 DIAGNOSIS — M79641 Pain in right hand: Secondary | ICD-10-CM

## 2013-10-10 DIAGNOSIS — R2231 Localized swelling, mass and lump, right upper limb: Secondary | ICD-10-CM

## 2013-10-10 DIAGNOSIS — M79672 Pain in left foot: Secondary | ICD-10-CM

## 2013-10-10 DIAGNOSIS — M79609 Pain in unspecified limb: Secondary | ICD-10-CM

## 2013-10-10 MED ORDER — MELOXICAM 15 MG PO TABS: 15.0000 mg | ORAL_TABLET | Freq: Every day | ORAL | Status: DC

## 2013-10-10 NOTE — Progress Notes (Signed)
Subjective:    Patient ID: Stephanie Bautista, female    DOB: 1951-03-05, 63 y.o.   MRN: [id number]  HPI   Stephanie Bautista is a very pleasant 63 yr old female here with several concerns, unsure if they are related.  (1)  Has noted a knot on the palmar aspect of her distal RIGHT thumb.  This has been present from probably 2 months.  It is tender to the touch.  She has pain when she reaches for things, grips things.  She has never had a nodule like this.  No other involved digits.  No known trauma to the thumb.  She is right hand dominant.  In addition, she has beginning to have pain at the volar aspect of her RIGHT forearm as well.  Pain seems to be exacerbated by pronation/supination.  No known injury to the arm.  She denies numbness or tingling.  Endorses some weakness in the RIGHT hand when gripping, but also thinks this may just be due to pain.    (2)  Additionally she has pain in the ball of her LEFT foot.  For awhile it felt like when she walked that she was stepping on a metal plate.  Now she is having pain.  With palpation yesterday she found a very painful knot near the base of the 3rd toe.  Has never had anything like this before.  No pain at rest, but pain with walking.  She does have legs aches - esp at night - this has been present for awhile now.    She is otherwise feeling well.  Has used Tylenol and Advil for pain relief.     Review of Systems  Constitutional: Negative for fever and chills.  Respiratory: Negative.   Cardiovascular: Negative.   Gastrointestinal: Negative.   Musculoskeletal: Positive for arthralgias.  Skin: Negative for color change and wound.  Neurological: Negative for numbness.       Objective:   Physical Exam  Vitals reviewed. Constitutional: She is oriented to person, place, and time. She appears well-developed and well-nourished. No distress.  HENT:  Head: Normocephalic and atraumatic.  Eyes: Conjunctivae are normal. No scleral icterus.  Cardiovascular:  Intact distal pulses.   Pulses:      Radial pulses are 2+ on the right side, and 2+ on the left side.       Dorsalis pedis pulses are 1+ on the right side, and 1+ on the left side.       Posterior tibial pulses are 2+ on the right side, and 2+ on the left side.  Pulmonary/Chest: Effort normal.  Musculoskeletal:       Right forearm: She exhibits tenderness. She exhibits no swelling and no deformity.       Right hand: She exhibits tenderness. She exhibits normal range of motion and normal capillary refill. Normal sensation noted. Normal strength noted.       Hands:      Left foot: She exhibits tenderness. She exhibits normal range of motion, no bony tenderness, no swelling, normal capillary refill and no deformity.  Subcutaneous nodule at palmar aspect of distal phalanx of RIGHT thumb; tender to palpation; full ROM; cap refill normal  Tenderness along volar aspect of RIGHT forearm; no bruising, swelling, deformity; increased pain with pronation and supination; full ROM at the wrist and elbow; finklestein's negative  Tenderness at plantar aspect of LEFT foot between 3rd and 4th metatarsals; difficult to appreciate if there is a nodule in this location  Neurological:  She is alert and oriented to person, place, and time. She has normal reflexes.  Skin: Skin is warm and dry.  Psychiatric: She has a normal mood and affect. Her behavior is normal.        Assessment & Plan:  Stephanie Bautista is a very pleasant 63 yr old female here with 1. Nodule of finger of right hand Nodule at palmar aspect of RIGHT thumb.  Present for about 2 months now, increasing in tenderness with no sign of improvement.  I am not sure of the source of this nodule - possible that it is part of the flexor tendon?  Given the location, will refer to hand surg for further evaluation of this.    - Ambulatory referral to Hand Surgery  2. Right hand pain  - Ambulatory referral to Hand Surgery - meloxicam (MOBIC) 15 MG tablet; Take  1 tablet (15 mg total) by mouth daily.  Dispense: 30 tablet; Refill: 1  3. Left foot pain Unclear if this may be related to the nodule at her thumb, but suspect Morton's neuroma given the location and quality of pain.  When I discuss this with pt she recalls that probably 40 yrs ago she may have been told she had this.  Additionally she reports that she has an acoustic neuroma and wonders if these may be related.  I am doubtful that there is correlation between as Morton's tend to be due to trauma, but I suppose something like neurofibromatosis could cause something like this - though she has no other manifestations of disease.  Will start with conservative treatment - metatarsal pads and NSAIDs.  Wear supportive shoes.  Ice when possible.  If no improvement in the next week or so would consider U/S or referral to podiatry or ortho.  Pt understands and agrees with this plan.  - meloxicam (MOBIC) 15 MG tablet; Take 1 tablet (15 mg total) by mouth daily.  Dispense: 30 tablet; Refill: 1  Pt to call or RTC if worsening or not improving  E. Natividad Brood MHS, PA-C [facility] 5/19/201511:07 AM

## 2013-10-10 NOTE — Patient Instructions (Signed)
I have referred you to see then hand specialist for the nodule at your thumb  In the mean time, take the meloxicam (Mobic) once daily to help with pain and inflammation.  Do not take any additional ibuprofen or Aleve as these medicines work the same way.  You can take Tylenol if needed for additional pain relief  For your foot, I wonder if you have a Morton neuroma.  The Mobic will help with pain.  I want you to pick up metatarsal pads (also called metatarsal cookies) - these help relieve some of the pressure from the ball of your foot.  I would wear these on both sides so you are walking evenly.  Make sure you are wearing supportive shoes with a wide toe box.  My hope is that symptoms will begin to diminish within a few days - if not we may have to send you to a foot specialist   Morton's Neuroma Neuralgia (nerve pain) or neuroma (benign [non-cancerous] nerve tumor) may develop on any interdigital nerve. The interdigital nerves (nerves between digits) of the foot travel beneath and between the metatarsals (long bones of the fore foot) and pass the nerve endings to the toes. The third interdigital is a common place for a small neuroma to form called Morton's neuroma. Another nerve to be affected commonly is the fourth interdigital nerve. This would be in approximately in the area of the base or ball under the bottom of your fourth toe. This condition occurs more commonly in women and is usually on one side. It is usually first noticed by pain radiating (spreading) to the ball of the foot or to the toes. CAUSES The cause of interdigital neuralgia may be from low grade repetitive trauma (damage caused by an accident) as in activities causing a repeated pounding of the foot (running, jumping etc.). It is also caused by improper footwear or recent loss of the fatty padding on the bottom of the foot. TREATMENT  The condition often resolves (goes away) simply with decreasing activity if that is thought to be  the cause. Proper shoes are beneficial. Orthotics (special foot support aids) such as a metatarsal bar are often beneficial. This condition usually responds to conservative therapy, however if surgery is necessary it usually brings complete relief. HOME CARE INSTRUCTIONS   Apply ice to the area of soreness for 15-20 minutes, 03-04 times per day, while awake for the first 2 days. Put ice in a plastic bag and place a towel between the bag of ice and your skin.  Only take over-the-counter or prescription medicines for pain, discomfort, or fever as directed by your caregiver. MAKE SURE YOU:   Understand these instructions.  Will watch your condition.  Will get help right away if you are not doing well or get worse. Document Released: 08/17/2000 Document Revised: 08/03/2011 Document Reviewed: 05/11/2005 [facility] Patient Information 2014 [place].

## 2014-02-27 ENCOUNTER — Other Ambulatory Visit: Payer: Self-pay | Admitting: Dermatology

## 2014-04-06 ENCOUNTER — Ambulatory Visit (INDEPENDENT_AMBULATORY_CARE_PROVIDER_SITE_OTHER): Payer: 59 | Admitting: Physician Assistant

## 2014-04-06 VITALS — BP 130/72 | HR 84 | Temp 98.7°F | Resp 16 | Ht 63.25 in | Wt 150.0 lb

## 2014-04-06 DIAGNOSIS — B9789 Other viral agents as the cause of diseases classified elsewhere: Secondary | ICD-10-CM

## 2014-04-06 DIAGNOSIS — R102 Pelvic and perineal pain: Secondary | ICD-10-CM

## 2014-04-06 DIAGNOSIS — N760 Acute vaginitis: Secondary | ICD-10-CM

## 2014-04-06 DIAGNOSIS — A499 Bacterial infection, unspecified: Secondary | ICD-10-CM

## 2014-04-06 DIAGNOSIS — R300 Dysuria: Secondary | ICD-10-CM

## 2014-04-06 DIAGNOSIS — B9689 Other specified bacterial agents as the cause of diseases classified elsewhere: Secondary | ICD-10-CM

## 2014-04-06 DIAGNOSIS — J069 Acute upper respiratory infection, unspecified: Secondary | ICD-10-CM

## 2014-04-06 LAB — POCT URINALYSIS DIPSTICK
Bilirubin, UA: NEGATIVE
Glucose, UA: NEGATIVE
Ketones, UA: NEGATIVE
Leukocytes, UA: NEGATIVE
Nitrite, UA: NEGATIVE
PROTEIN UA: NEGATIVE
RBC UA: NEGATIVE
SPEC GRAV UA: 1.02
UROBILINOGEN UA: 0.2
pH, UA: 5

## 2014-04-06 LAB — POCT WET PREP WITH KOH
KOH PREP POC: NEGATIVE
TRICHOMONAS UA: NEGATIVE
Yeast Wet Prep HPF POC: NEGATIVE

## 2014-04-06 LAB — POCT UA - MICROSCOPIC ONLY
CRYSTALS, UR, HPF, POC: NEGATIVE
Casts, Ur, LPF, POC: NEGATIVE
Mucus, UA: NEGATIVE
YEAST UA: NEGATIVE

## 2014-04-06 MED ORDER — HYDROCOD POLST-CHLORPHEN POLST 10-8 MG/5ML PO LQCR: 5.0000 mL | Freq: Two times a day (BID) | ORAL | Status: DC | PRN

## 2014-04-06 MED ORDER — GUAIFENESIN ER 1200 MG PO TB12: 1.0000 | ORAL_TABLET | Freq: Two times a day (BID) | ORAL | Status: DC | PRN

## 2014-04-06 MED ORDER — IPRATROPIUM BROMIDE 0.03 % NA SOLN: 2.0000 | Freq: Two times a day (BID) | NASAL | Status: DC

## 2014-04-06 MED ORDER — METRONIDAZOLE 500 MG PO TABS: 500.0000 mg | ORAL_TABLET | Freq: Two times a day (BID) | ORAL | Status: DC

## 2014-04-06 NOTE — Patient Instructions (Signed)
Bacterial Vaginosis Bacterial vaginosis is a vaginal infection that occurs when the normal balance of bacteria in the vagina is disrupted. It results from an overgrowth of certain bacteria. This is the most common vaginal infection in women of childbearing age. Treatment is important to prevent complications, especially in pregnant women, as it can cause a premature delivery. CAUSES  Bacterial vaginosis is caused by an increase in harmful bacteria that are normally present in smaller amounts in the vagina. Several different kinds of bacteria can cause bacterial vaginosis. However, the reason that the condition develops is not fully understood. RISK FACTORS Certain activities or behaviors can put you at an increased risk of developing bacterial vaginosis, including:  Having a new sex partner or multiple sex partners.  Douching.  Using an intrauterine device (IUD) for contraception. Women do not get bacterial vaginosis from toilet seats, bedding, swimming pools, or contact with objects around them. SIGNS AND SYMPTOMS  Some women with bacterial vaginosis have no signs or symptoms. Common symptoms include:  Grey vaginal discharge.  A fishlike odor with discharge, especially after sexual intercourse.  Itching or burning of the vagina and vulva.  Burning or pain with urination. DIAGNOSIS  Your health care provider will take a medical history and examine the vagina for signs of bacterial vaginosis. A sample of vaginal fluid may be taken. Your health care provider will look at this sample under a microscope to check for bacteria and abnormal cells. A vaginal pH test may also be done.  TREATMENT  Bacterial vaginosis may be treated with antibiotic medicines. These may be given in the form of a pill or a vaginal cream. A second round of antibiotics may be prescribed if the condition comes back after treatment.  HOME CARE INSTRUCTIONS   Only take over-the-counter or prescription medicines as  directed by your health care provider.  If antibiotic medicine was prescribed, take it as directed. Make sure you finish it even if you start to feel better.  Do not have sex until treatment is completed.  Tell all sexual partners that you have a vaginal infection. They should see their health care provider and be treated if they have problems, such as a mild rash or itching.  Practice safe sex by using condoms and only having one sex partner. SEEK MEDICAL CARE IF:   Your symptoms are not improving after 3 days of treatment.  You have increased discharge or pain.  You have a fever. MAKE SURE YOU:   Understand these instructions.  Will watch your condition.  Will get help right away if you are not doing well or get worse. FOR MORE INFORMATION  Centers for Disease Control and Prevention, Division of STD Prevention: www.cdc.gov/std American Sexual Health Association (ASHA): www.ashastd.org  Document Released: 05/11/2005 Document Revised: 03/01/2013 Document Reviewed: 12/21/2012 [employer] Patient Information 2015 [employer]. This information is not intended to replace advice given to you by your health care provider. Make sure you discuss any questions you have with your health care provider.  

## 2014-04-06 NOTE — Progress Notes (Signed)
MRN: [id number] DOB: 1950/09/06  Subjective:   Stephanie Bautista is a 63 y.o. female presenting for cough and vaginal irritation.  Cough - reports 4 day history of worsening dry hacking cough, worse at night and with lying down. Patient feels like she has mucus in her throat but can't get it out, progressed to sore throat after 2 days. Has had difficulty sleeping d/t cough, sinus congestion, watery eyes, some difficulty swallowing cold liquids but not with eating. Denies ear pain, eye pain, sinus pain, chest pain, sob, rhinorrhea. Has not been resting or drinking as much water. Has not tried any medicine. No sick contacts but was around 6 grandchildren over the weekend but none were ill. Denies history of seasonal allergies or asthma. No smoking or drinking alcohol.   Vaginal irritation - reports 5 day history of dysuria, cloudy urine, some slight yellow/brown vaginal discharge, malodorous at times, burning and itching of the vagina, thought it was yeast infection. Tried Monistat for 3 days through Wednesday, no relief. Today, developed left sided pelvic pain. Not sexually active, monogamous relationship. Unsure about rashes, lesions, erythema. No abdominal pain, fevers, n/v, diarrhea. Of note, ~15 years ago patient dealt with multiple yeast infections, used to get yeast infections every time she had sex, worked with GYN for 6 months to finally resolve this. They tried PO medicines, topical tx, GYN "painted her", tried boric acid tablets, patient cannot recall anything in particular that helped resolve her issues completely. This episode is very similar to what she used to experience except for pelvic pain.  Denies any other aggravating or relieving factors, no other questions or concerns.   Prior to Admission medications   Medication Sig Start Date End Date Taking? Authorizing Provider  meloxicam (MOBIC) 15 MG tablet Take 1 tablet (15 mg total) by mouth daily. 10/10/13   Theda Sers, PA-C     Allergies  Allergen Reactions  . Amoxicillin Itching and Rash    Past Medical History  Diagnosis Date  . Adenomatous colon polyp 12/2002  . Hemorrhoids   . Inguinal hernia   . Breast cancer   . Cholelithiases   . GERD (gastroesophageal reflux disease)   . Arthritis   . Esophageal stricture   . Hiatal hernia   . UTI (lower urinary tract infection)     Past Surgical History  Procedure Laterality Date  . Breast lumpectomy    . Inguinal hernia repair  1968  . Hemorrhoid surgery  2007  . Partial hysterectomy  1996  . Cesarean section      2x  . Tonsillectomy    . Cholecystectomy  2008    ROS As in subjective.   Objective:   PHYSICAL EXAM BP 130/72 mmHg  Pulse 84  Temp(Src) 98.7 F (37.1 C) (Oral)  Resp 16  Ht 5' 3.25" (1.607 m)  Wt 150 lb (68.04 kg)  BMI 26.35 kg/m2  SpO2 97%  Physical Exam  Constitutional: She is oriented to person, place, and time and well-developed, well-nourished, and in no distress. No distress.  HENT:  Right Ear: External ear normal.  Left Ear: External ear normal.  Mouth/Throat: Oropharynx is clear and moist. No oropharyngeal exudate.  Eyes: Conjunctivae are normal. Pupils are equal, round, and reactive to light. Right eye exhibits no discharge. Left eye exhibits no discharge. No scleral icterus.  Neck: Normal range of motion. Neck supple. No thyromegaly present.  Cardiovascular: Normal rate, regular rhythm, normal heart sounds and intact distal pulses.  Exam reveals  no gallop and no friction rub.   No murmur heard. Pulmonary/Chest: Effort normal and breath sounds normal. No stridor. No respiratory distress. She has no wheezes. She has no rales. She exhibits no tenderness.  Abdominal: Soft. Bowel sounds are normal. She exhibits no distension and no mass. There is tenderness (left lower quadrant/suprapubic).  Genitourinary: Vulva normal. Right adnexum displays no mass. Left adnexum displays no mass. Vulva exhibits no erythema, no  exudate, no lesion and no rash. Thick  odorless  white and vaginal discharge found.  Hysterectomy. Tender throughout entire pelvic exam (reports a history of difficulty with this with her Gyn as well).  Musculoskeletal: Normal range of motion. She exhibits no edema.  Lymphadenopathy:    She has no cervical adenopathy.  Neurological: She is alert and oriented to person, place, and time.  Skin: Skin is warm and dry. No rash noted. She is not diaphoretic. No erythema.   Results for orders placed or performed in visit on 04/06/14 (from the past 24 hour(s))  POCT urinalysis dipstick     Status: None   Collection Time: 04/06/14 10:19 AM  Result Value Ref Range   Color, UA yellow    Clarity, UA slightly cloudy    Glucose, UA neg    Bilirubin, UA neg    Ketones, UA neg    Spec Grav, UA 1.020    Blood, UA neg    pH, UA 5.0    Protein, UA neg    Urobilinogen, UA 0.2    Nitrite, UA neg    Leukocytes, UA Negative   POCT UA - Microscopic Only     Status: None   Collection Time: 04/06/14 10:21 AM  Result Value Ref Range   WBC, Ur, HPF, POC 0-2    RBC, urine, microscopic 0-2    Bacteria, U Microscopic trace    Mucus, UA neg    Epithelial cells, urine per micros 0-2    Crystals, Ur, HPF, POC neg    Casts, Ur, LPF, POC neg    Yeast, UA neg   POCT Wet Prep with KOH     Status: None   Collection Time: 04/06/14 10:23 AM  Result Value Ref Range   Trichomonas, UA Negative    Clue Cells Wet Prep HPF POC 0-2    Epithelial Wet Prep HPF POC 2-6    Yeast Wet Prep HPF POC neg    Bacteria Wet Prep HPF POC trace    RBC Wet Prep HPF POC 0-1    WBC Wet Prep HPF POC 0-2    KOH Prep POC Negative     Assessment and Plan :   Patient had to leave, was not able to wait for lab results. Treated patient for viral URI and empirically for BV. Will follow up with lab results. Advised to return to clinic if symptoms worsen, fail to resolve or as needed.  1. Dysuria - Pending labs consider antibiotic - POCT  urinalysis dipstick - POCT UA - Microscopic Only  2. Pelvic pain in female Treated as above. - POCT Wet Prep with KOH  3. Viral URI with cough Advised, rest and hydration, return to clinic if symptoms worsen, fail to resolve or as needed - Guaifenesin (MUCINEX MAXIMUM STRENGTH) 1200 MG TB12; Take 1 tablet (1,200 mg total) by mouth every 12 (twelve) hours as needed.  Dispense: 14 tablet; Refill: 1 - chlorpheniramine-HYDROcodone (TUSSIONEX PENNKINETIC ER) 10-8 MG/5ML LQCR; Take 5 mLs by mouth every 12 (twelve) hours as needed for cough (  cough).  Dispense: 1 Bottle; Refill: 0 - ipratropium (ATROVENT) 0.03 % nasal spray; Place 2 sprays into both nostrils 2 (two) times daily.  Dispense: 30 mL; Refill: 1  4. BV (bacterial vaginosis) Treated as above. - metroNIDAZOLE (FLAGYL) 500 MG tablet; Take 1 tablet (500 mg total) by mouth 2 (two) times daily with a meal. DO NOT CONSUME ALCOHOL WHILE TAKING THIS MEDICATION.  Dispense: 14 tablet; Refill: 0   Jaynee Eagles, PA-C [facility] [phone] 04/06/2014 10:30 AM

## 2014-04-07 NOTE — Progress Notes (Signed)
I have discussed this case with Mr. Bess Harvest, [place], assisted during pelvic exam and agree.

## 2014-12-27 ENCOUNTER — Ambulatory Visit (INDEPENDENT_AMBULATORY_CARE_PROVIDER_SITE_OTHER): Payer: 59

## 2014-12-27 ENCOUNTER — Ambulatory Visit (INDEPENDENT_AMBULATORY_CARE_PROVIDER_SITE_OTHER): Payer: 59 | Admitting: Family Medicine

## 2014-12-27 VITALS — BP 130/90 | HR 77 | Temp 98.2°F | Resp 16 | Ht 63.25 in | Wt 149.6 lb

## 2014-12-27 DIAGNOSIS — R0789 Other chest pain: Secondary | ICD-10-CM

## 2014-12-27 DIAGNOSIS — M79641 Pain in right hand: Secondary | ICD-10-CM

## 2014-12-27 DIAGNOSIS — M79672 Pain in left foot: Secondary | ICD-10-CM | POA: Diagnosis not present

## 2014-12-27 MED ORDER — TRAMADOL HCL 50 MG PO TABS: 50.0000 mg | ORAL_TABLET | Freq: Four times a day (QID) | ORAL | Status: DC | PRN

## 2014-12-27 MED ORDER — MELOXICAM 15 MG PO TABS: 15.0000 mg | ORAL_TABLET | Freq: Every day | ORAL | Status: DC

## 2014-12-27 NOTE — Progress Notes (Addendum)
Subjective:   This chart was scribed for Dr. Delman Cheadle, MD by Erling Conte, ED Scribe. This patient was seen in Room 11 and the patient's care was started at 9:20 AM.   Patient ID: Stephanie Bautista, female    DOB: 1951/05/02, 64 y.o.   MRN: [id number]  Chief Complaint  Patient presents with  . breast pain    pain under right breast, on going for years,but recently went water skiing and the pain is worst,causes some SOB     HPI HPI Comments: Syrah B Fasching is a 64 y.o. female who presents to the [facility] complaining of intermittent, gradually worsening, sharp, pain under her left breast for 5 years. Pt states the pain radiates to her back. She states the pain is exacerbated when she bends down or takes a deep breath and causes her severe pain for several seconds and goes away. Pt is complaining of associated SOB due to pain. She has h/o pain in her right breast for 10 years. She has taken Ibuprofen (2 tablets in the AM) which has provided mild relief. Pt notes she went water skiing recently which could be the cause of some of the pain but she is unsure. She has not had her mammogram this year yet but states she is needed to schedule it. She denies h/o rib injury. Pt has h/o breast cancer, 19 years ago. She has a h/o osteoarthritis. No regular medication use. She has a h/o acid reflux. She denies any breast changes, skin changes of the breast, heart palpitations, or URI symptoms.  PCP: Haywood Pao, MD   Chart Review Sees Dr. Nori Riis, OB-GYN, and had right breast US 1 yr ago due to focal deep right breast pain near area of prior radiation therapy for 10 years. [place] and mammogram were normal at that time. Pt has a h/o GERD, seens by Dr. Fuller Plan at [facility] and last upper endoscopy was 2008 which showed gastritis.   Patient Active Problem List   Diagnosis Date Noted  . Tinnitus of left ear 08/12/2011  . Nailbed injury 08/12/2011  . Sensorineural hearing loss, unilateral  08/12/2011  . ADENOCARCINOMA, BREAST 10/07/2007  . ADENOMATOUS COLONIC POLYP 10/07/2007  . HEMORRHOIDS 10/07/2007  . GERD 10/07/2007  . GASTRITIS 10/07/2007  . INGUINAL HERNIA 10/07/2007  . CHOLELITHIASIS 10/07/2007  . ARTHRITIS 10/07/2007   Past Medical History  Diagnosis Date  . Adenomatous colon polyp 12/2002  . Hemorrhoids   . Inguinal hernia   . Breast cancer   . Cholelithiases   . GERD (gastroesophageal reflux disease)   . Arthritis   . Esophageal stricture   . Hiatal hernia   . UTI (lower urinary tract infection)    Past Surgical History  Procedure Laterality Date  . Breast lumpectomy    . Inguinal hernia repair  1968  . Hemorrhoid surgery  2007  . Partial hysterectomy  1996  . Cesarean section      2x  . Tonsillectomy    . Cholecystectomy  2008   Allergies  Allergen Reactions  . Amoxicillin Itching and Rash   Prior to Admission medications   Medication Sig Start Date End Date Taking? Authorizing Provider  chlorpheniramine-HYDROcodone (TUSSIONEX PENNKINETIC ER) 10-8 MG/5ML LQCR Take 5 mLs by mouth every 12 (twelve) hours as needed for cough (cough). Patient not taking: Reported on 12/27/2014 04/06/14   Jaynee Eagles, PA-C  Guaifenesin [facility] MAXIMUM STRENGTH) 1200 MG TB12 Take 1 tablet (1,200 mg total) by mouth every  12 (twelve) hours as needed. Patient not taking: Reported on 12/27/2014 04/06/14   Jaynee Eagles, PA-C  ipratropium (ATROVENT) 0.03 % nasal spray Place 2 sprays into both nostrils 2 (two) times daily. Patient not taking: Reported on 12/27/2014 04/06/14   Jaynee Eagles, PA-C  meloxicam (MOBIC) 15 MG tablet Take 1 tablet (15 mg total) by mouth daily. Patient not taking: Reported on 12/27/2014 10/10/13   Theda Sers, PA-C  metroNIDAZOLE (FLAGYL) 500 MG tablet Take 1 tablet (500 mg total) by mouth 2 (two) times daily with a meal. DO NOT CONSUME ALCOHOL WHILE TAKING THIS MEDICATION. Patient not taking: Reported on 12/27/2014 04/06/14   Jaynee Eagles, PA-C   History    Social History  . Marital Status: Married    Spouse Name: N/A  . Number of Children: N/A  . Years of Education: N/A   Occupational History  . office    Social History Main Topics  . Smoking status: Never Smoker   . Smokeless tobacco: Never Used  . Alcohol Use: No  . Drug Use: No  . Sexual Activity: Not on file   Other Topics Concern  . Not on file   Social History Narrative       Review of Systems  Constitutional: Negative for fever and chills.  HENT: Negative for congestion, postnasal drip and rhinorrhea.   Respiratory: Positive for shortness of breath (due to pain). Negative for cough.   Cardiovascular: Positive for chest pain (under left breast). Negative for palpitations.  Skin: Negative for color change and rash.       Objective:  BP 130/90 mmHg  Pulse 77  Temp(Src) 98.2 F (36.8 C) (Oral)  Resp 16  Ht 5' 3.25" (1.607 m)  Wt 149 lb 9.6 oz (67.858 kg)  BMI 26.28 kg/m2  SpO2 99%    Physical Exam  Constitutional: She is oriented to person, place, and time. She appears well-developed and well-nourished. No distress.  HENT:  Head: Normocephalic and atraumatic.  Eyes: Conjunctivae and EOM are normal.  Neck: Neck supple. No tracheal deviation present.  Cardiovascular: Normal rate, regular rhythm, S1 normal, S2 normal and normal heart sounds.   No murmur heard. Pulmonary/Chest: Effort normal and breath sounds normal. No respiratory distress. She has no decreased breath sounds. She has no wheezes. She has no rhonchi. She has no rales. She exhibits tenderness. Left breast exhibits no inverted nipple, no mass, no nipple discharge, no skin change and no tenderness.  Very severe pain with palpation with left inner lower quadrant of breast. Worse with deep palpation and severe tenderness with palpation of anterior lower ribs, below chest wall  Musculoskeletal: Normal range of motion.  Lymphadenopathy:    She has no axillary adenopathy.       Right: No  supraclavicular adenopathy present.       Left: No supraclavicular adenopathy present.  Neurological: She is alert and oriented to person, place, and time.  Skin: Skin is warm and dry.  Psychiatric: She has a normal mood and affect. Her behavior is normal.  Nursing note and vitals reviewed.    [facility] reading (PRIMARY) by  Dr. Brigitte Pulse. Left rib with chest: no acute abnormality.  Dg Ribs Unilateral W/chest Left  12/27/2014   CLINICAL DATA:  Pain after water skiing  EXAM: LEFT RIBS AND CHEST - 3+ VIEW  COMPARISON:  None.  FINDINGS: Frontal chest as well as oblique and cone-down lower rib images were obtained. There is slight scarring in the apices. Elsewhere lungs are clear.  Heart size and pulmonary vascularity are normal. No adenopathy. There is upper thoracic levoscoliosis with lower thoracic dextroscoliosis. There is no apparent pneumothorax or effusion. No demonstrable rib fracture. There are surgical clips in the right upper quadrant.  IMPRESSION: No edema or consolidation. Areas of thoracic scoliosis. No rib fracture apparent.   Electronically Signed   By: Lowella Grip III M.D.   On: 12/27/2014 10:41    Assessment & Plan:   1. Chest wall pain   2. Right hand pain   3. Left foot pain     Orders Placed This Encounter  Procedures  . DG Ribs Unilateral W/Chest Left    Standing Status: Future     Number of Occurrences: 1     Standing Expiration Date: 12/27/2015    Order Specific Question:  Reason for Exam (SYMPTOM  OR DIAGNOSIS REQUIRED)    Answer:  severe anterior chest wall pain    Order Specific Question:  Preferred imaging location?    Answer:  External    Meds ordered this encounter  Medications  . meloxicam (MOBIC) 15 MG tablet    Sig: Take 1 tablet (15 mg total) by mouth daily.    Dispense:  30 tablet    Refill:  1  . traMADol (ULTRAM) 50 MG tablet    Sig: Take 1 tablet (50 mg total) by mouth every 6 (six) hours as needed for moderate pain.    Dispense:  30 tablet     Refill:  0    I personally performed the services described in this documentation, which was scribed in my presence. The recorded information has been reviewed and considered, and addended by me as needed.  Delman Cheadle, MD MPH

## 2014-12-27 NOTE — Patient Instructions (Addendum)
Chest Wall Pain °Chest wall pain is pain in or around the bones and muscles of your chest. It may take up to 6 weeks to get better. It may take longer if you must stay physically active in your work and activities.  °CAUSES  °Chest wall pain may happen on its own. However, it may be caused by: °· A viral illness like the flu. °· Injury. °· Coughing. °· Exercise. °· Arthritis. °· Fibromyalgia. °· Shingles. °HOME CARE INSTRUCTIONS  °· Avoid overtiring physical activity. Try not to strain or perform activities that cause pain. This includes any activities using your chest or your abdominal and side muscles, especially if heavy weights are used. °· Put ice on the sore area. °¨ Put ice in a plastic bag. °¨ Place a towel between your skin and the bag. °¨ Leave the ice on for 15-20 minutes per hour while awake for the first 2 days. °· Only take over-the-counter or prescription medicines for pain, discomfort, or fever as directed by your caregiver. °SEEK IMMEDIATE MEDICAL CARE IF:  °· Your pain increases, or you are very uncomfortable. °· You have a fever. °· Your chest pain becomes worse. °· You have new, unexplained symptoms. °· You have nausea or vomiting. °· You feel sweaty or lightheaded. °· You have a cough with phlegm (sputum), or you cough up blood. °MAKE SURE YOU:  °· Understand these instructions. °· Will watch your condition. °· Will get help right away if you are not doing well or get worse. °Document Released: 05/11/2005 Document Revised: 08/03/2011 Document Reviewed: 01/05/2011 °ExitCare® Patient Information ©2015 [employer]. This information is not intended to replace advice given to you by your health care provider. Make sure you discuss any questions you have with your health care provider. ° °Costochondritis °Costochondritis, sometimes called Tietze syndrome, is a swelling and irritation (inflammation) of the tissue (cartilage) that connects your ribs with your breastbone (sternum). It causes pain in  the chest and rib area. Costochondritis usually goes away on its own over time. It can take up to 6 weeks or longer to get better, especially if you are unable to limit your activities. °CAUSES  °Some cases of costochondritis have no known cause. Possible causes include: °· Injury (trauma). °· Exercise or activity such as lifting. °· Severe coughing. °SIGNS AND SYMPTOMS °· Pain and tenderness in the chest and rib area. °· Pain that gets worse when coughing or taking deep breaths. °· Pain that gets worse with specific movements. °DIAGNOSIS  °Your health care provider will do a physical exam and ask about your symptoms. Chest X-rays or other tests may be done to rule out other problems. °TREATMENT  °Costochondritis usually goes away on its own over time. Your health care provider may prescribe medicine to help relieve pain. °HOME CARE INSTRUCTIONS  °· Avoid exhausting physical activity. Try not to strain your ribs during normal activity. This would include any activities using chest, abdominal, and side muscles, especially if heavy weights are used. °· Apply ice to the affected area for the first 2 days after the pain begins. °¨ Put ice in a plastic bag. °¨ Place a towel between your skin and the bag. °¨ Leave the ice on for 20 minutes, 2-3 times a day. °· Only take over-the-counter or prescription medicines as directed by your health care provider. °SEEK MEDICAL CARE IF: °· You have redness or swelling at the rib joints. These are signs of infection. °· Your pain does not go away despite rest   or medicine. °SEEK IMMEDIATE MEDICAL CARE IF:  °· Your pain increases or you are very uncomfortable. °· You have shortness of breath or difficulty breathing. °· You cough up blood. °· You have worse chest pains, sweating, or vomiting. °· You have a fever or persistent symptoms for more than 2-3 days. °· You have a fever and your symptoms suddenly get worse. °MAKE SURE YOU:  °· Understand these instructions. °· Will watch your  condition. °· Will get help right away if you are not doing well or get worse. °Document Released: 02/18/2005 Document Revised: 03/01/2013 Document Reviewed: 12/13/2012 °ExitCare® Patient Information ©2015 [employer]. This information is not intended to replace advice given to you by your health care provider. Make sure you discuss any questions you have with your health care provider. ° °

## 2015-01-01 ENCOUNTER — Other Ambulatory Visit: Payer: Self-pay | Admitting: Obstetrics & Gynecology

## 2015-01-02 LAB — CYTOLOGY - PAP

## 2015-01-07 ENCOUNTER — Other Ambulatory Visit: Payer: Self-pay | Admitting: Oncology

## 2015-01-07 ENCOUNTER — Encounter: Payer: Self-pay | Admitting: Oncology

## 2015-01-18 ENCOUNTER — Telehealth: Payer: Self-pay | Admitting: Hematology

## 2015-01-18 NOTE — Telephone Encounter (Signed)
Called pt and left message regarding new patient appt.  Dx: Chronic chest wall pain presumably from radiation Referring: Dr. Nori Riis

## 2015-02-01 ENCOUNTER — Telehealth: Payer: Self-pay | Admitting: Hematology

## 2015-02-01 NOTE — Telephone Encounter (Signed)
S/w patient and gave np appt for 09/21 @ 8:45 w/Dr. Irene Limbo.  Referring  Dr. Evette Cristal Dx- Chronic chest wallm pain presumably from rad onc   Referral information scanned from review

## 2015-02-13 ENCOUNTER — Other Ambulatory Visit: Payer: 59

## 2015-02-13 ENCOUNTER — Encounter: Payer: Self-pay | Admitting: Hematology

## 2015-02-13 ENCOUNTER — Telehealth: Payer: Self-pay | Admitting: Hematology

## 2015-02-13 ENCOUNTER — Ambulatory Visit (HOSPITAL_BASED_OUTPATIENT_CLINIC_OR_DEPARTMENT_OTHER): Payer: 59 | Admitting: Hematology

## 2015-02-13 VITALS — BP 154/66 | HR 61 | Temp 97.5°F | Resp 18 | Ht 63.25 in | Wt 150.3 lb

## 2015-02-13 DIAGNOSIS — M792 Neuralgia and neuritis, unspecified: Secondary | ICD-10-CM | POA: Diagnosis not present

## 2015-02-13 DIAGNOSIS — R0789 Other chest pain: Secondary | ICD-10-CM | POA: Diagnosis not present

## 2015-02-13 DIAGNOSIS — Z853 Personal history of malignant neoplasm of breast: Secondary | ICD-10-CM

## 2015-02-13 DIAGNOSIS — D0511 Intraductal carcinoma in situ of right breast: Secondary | ICD-10-CM

## 2015-02-13 MED ORDER — LIDOCAINE 5 % EX PTCH: 1.0000 | MEDICATED_PATCH | CUTANEOUS | Status: DC

## 2015-02-13 NOTE — Progress Notes (Signed)
[facility]    HEMATOLOGY/ONCOLOGY CONSULTATION NOTE  Date of Service: 02/13/2015  Patient Care Team: Drenda Freeze, MD as PCP - General (Internal Medicine)  CHIEF COMPLAINTS/PURPOSE OF CONSULTATION:  Chest wall pain/discomfort and nodular feeling.   HISTORY OF PRESENTING ILLNESS:  Stephanie Bautista is a wonderful 64 y.o. female who has been referred to [place] by Dr .Haywood Pao, MD for evaluation and management of chest wall pain in the setting of history of breast cancer.  Patient has a history of right-sided DCIS treated with lumpectomy in 1995 followed by 5040 cGy of radiation in 28 fractions to the right breast and 6040 cGy in 33 fractions to the right breast tumor bed. At the time she tolerated treatment well. Was managed by Dr. Osborn Coho. She did not have any significant complications from her surgery or radiation therapy at that time.  Patient notes that about 5 years ago she started developing significant pain under her right breast. It felt like a burning sensation and paresthesias that made the area field nodular. There was also some electric shocklike sensations that moved from the chest wall towards the back along the chest wall. Patient notes that the pain is at times positional. There is no previous local tenderness to touch. She has tried multiple things and recently has been on round-the-clock meloxicam with some relief. She also notes that she might have had developed the rash with some blisters in the area bringing up the concern of possible shingles and postherpetic neuralgia. She also had some pain in the left breast over the last few months which she thinks was more musculoskeletal and has not resolved. She notes that she also had some rash under the left breast as well which is now resolved. She went to urgent care with the symptoms on 12/27/2014 had  rib x-rays that showed no local abnormalities but noted thoracic scoliosis.  Her last breast imaging has been in August 2015 and she  notes that she is due for that again this year.   Patient is being seen at [facility] for evaluation and management of an acoustic neuroma.  She is seeing GYN for menopausal symptoms.    MEDICAL HISTORY:  Past Medical History  Diagnosis Date  . Adenomatous colon polyp 12/2002  . Hemorrhoids   . Inguinal hernia   . Breast cancer   . Cholelithiases   . GERD (gastroesophageal reflux disease)   . Arthritis   . Esophageal stricture   . Hiatal hernia   . UTI (lower urinary tract infection)    Morton's neuroma left foot Acoustic neuroma  SURGICAL HISTORY: Past Surgical History  Procedure Laterality Date  . Breast lumpectomy    . Inguinal hernia repair  1968  . Hemorrhoid surgery  2007  . Partial hysterectomy  1996  . Cesarean section      2x  . Tonsillectomy    . Cholecystectomy  2008    SOCIAL HISTORY: Social History   Social History  . Marital Status: Married    Spouse Name: N/A  . Number of Children: N/A  . Years of Education: N/A   Occupational History  . office    Social History Main Topics  . Smoking status: Never Smoker   . Smokeless tobacco: Never Used  . Alcohol Use: No  . Drug Use: No  . Sexual Activity: Not on file   Other Topics Concern  . Not on file   Social History Narrative    FAMILY HISTORY: Family History  Problem Relation Age of Onset  . Cancer      ? Grandmother  . Diabetes    . Heart disease Mother     ALLERGIES:  is allergic to amoxicillin.  MEDICATIONS:  Current Outpatient Prescriptions  Medication Sig Dispense Refill  . meloxicam (MOBIC) 15 MG tablet Take 1 tablet (15 mg total) by mouth daily. 30 tablet 1  . traMADol (ULTRAM) 50 MG tablet Take 1 tablet (50 mg total) by mouth every 6 (six) hours as needed for moderate pain. 30 tablet 0   No current facility-administered medications for this visit.    REVIEW OF SYSTEMS:    10 Point review of Systems was done is negative except as noted above.  PHYSICAL  EXAMINATION: ECOG PERFORMANCE STATUS: 1 - Symptomatic but completely ambulatory .BP 154/66 mmHg  Pulse 61  Temp(Src) 97.5 F (36.4 C) (Oral)  Resp 18  Ht 5' 3.25" (1.607 m)  Wt 150 lb 4.8 oz (68.176 kg)  BMI 26.40 kg/m2  SpO2 100%  . Filed Vitals:   02/13/15 0908  Height: 5' 3.25" (1.607 m)  Weight: 150 lb 4.8 oz (68.176 kg)   Filed Weights   02/13/15 0908  Weight: 150 lb 4.8 oz (68.176 kg)   .Body mass index is 26.4 kg/(m^2).  GENERAL:alert, in no acute distress and comfortable SKIN: skin color, texture, turgor are normal, no rashes or significant lesions EYES: normal, conjunctiva are pink and non-injected, sclera clear OROPHARYNX:no exudate, no erythema and lips, buccal mucosa, and tongue normal  NECK: supple, no JVD, thyroid normal size, non-tender, without nodularity LYMPH:  no palpable lymphadenopathy in the cervical, axillary or inguinal LUNGS: clear to auscultation with normal respiratory effort HEART: regular rate & rhythm,  no murmurs and no lower extremity edema ABDOMEN: abdomen soft, non-tender, normoactive bowel sounds  Musculoskeletal: no cyanosis of digits and no clubbing  PSYCH: alert & oriented x 3 with fluent speech NEURO: no focal motor/sensory deficits Chest/Breast: Nodular feel along the intercostal nerve the patient appears to be having pain  Under the right breast. Overt skin rashes noted .no pain under the left breast at this time . No dominant breast lumps noted on right or left .no palpable axillary lymphadenopathy .  LABORATORY DATA:  I have reviewed the data as listed  . CBC Latest Ref Rng 11/16/2011 12/14/2006  WBC 4.6 - 10.2 K/uL 8.8 4.2  Hemoglobin 12.2 - 16.2 g/dL 14.3 13.4  Hematocrit 37.7 - 47.9 % 42.9 39.7  Platelets - - 200    . CMP Latest Ref Rng 11/16/2011  Glucose 70 - 99 mg/dL 95  BUN 6 - 23 mg/dL 10  Creatinine 0.50 - 1.10 mg/dL 0.69  Sodium 135 - 145 mEq/L 139  Potassium 3.5 - 5.3 mEq/L 4.0  Chloride 96 - 112 mEq/L 105    CO2 19 - 32 mEq/L 26  Calcium 8.4 - 10.5 mg/dL 9.5     RADIOGRAPHIC STUDIES: I have personally reviewed the radiological images as listed and agreed with the findings in the report. No results found.  ASSESSMENT & PLAN:   65 year old female with  1. Right breast DCIS status post lumpectomy and radiation in 1995/96. No evidence of disease recurrence at this time. 2. Right sided chest wall pain and discomfort under the right breast. Patient's symptoms suggest she might have had shingles in the area before and this could be post herpetic neuralgia.  Rule out nerve sheath tumor. Rule out intercostal nerve impingement. Rule out radiculopathy in the thoracic spine. Plan -  Continue meloxicam and when necessary tramadol for pain management at this time. -Ordered lidocaine patch for local pain control. -MRI of the chest and thoracic spine for further evaluation of the pain. -If no structural lesions are seen that explain the patient's pain might need to consider an intracostal nerve block to achieve pain relief.  Return to care in 3 weeks with above mentioned imaging studies   All of the patients questions were answered with apparent satisfaction. The patient knows to call the clinic with any problems, questions or concerns.  I spent 35 minutes counseling the patient face to face. The total time spent in the appointment was 45 minutes and more than 50% was on counseling and direct patient cares.    Sullivan Lone MD [place] AAHIVMS [place] [facility] Hematology/Oncology Physician [facility]  (Office):       [phone] (Work cell):  [phone] (Fax):           [phone]  02/13/2015 9:05 AM

## 2015-02-13 NOTE — Telephone Encounter (Signed)
per pof to sch pt appt-cld pt to adv of time & date of next appt-adv [facility] will call to sch scan

## 2015-02-20 ENCOUNTER — Ambulatory Visit (HOSPITAL_COMMUNITY): Payer: 59

## 2015-02-21 ENCOUNTER — Telehealth: Payer: Self-pay | Admitting: Hematology

## 2015-02-21 NOTE — Telephone Encounter (Signed)
pt cld to r/s appt-gave tp r/s time & date °

## 2015-02-25 ENCOUNTER — Ambulatory Visit (HOSPITAL_COMMUNITY): Payer: 59

## 2015-02-27 ENCOUNTER — Telehealth: Payer: Self-pay | Admitting: Hematology

## 2015-02-27 ENCOUNTER — Ambulatory Visit: Payer: 59 | Admitting: Hematology

## 2015-02-27 NOTE — Telephone Encounter (Signed)
pt cl left voicemail in re to MRI scheduling and pre-cert-cld pt back and she stated she had spoke to [facility] and matter were taken care of

## 2015-03-01 ENCOUNTER — Other Ambulatory Visit: Payer: Self-pay | Admitting: Hematology

## 2015-03-01 ENCOUNTER — Ambulatory Visit (HOSPITAL_COMMUNITY)
Admission: RE | Admit: 2015-03-01 | Discharge: 2015-03-01 | Disposition: A | Discharge status: Home / Self Care | Payer: 59 | Source: Ambulatory Visit | Attending: Hematology | Admitting: Hematology

## 2015-03-01 DIAGNOSIS — R0789 Other chest pain: Secondary | ICD-10-CM

## 2015-03-01 DIAGNOSIS — M792 Neuralgia and neuritis, unspecified: Secondary | ICD-10-CM

## 2015-03-01 DIAGNOSIS — M4694 Unspecified inflammatory spondylopathy, thoracic region: Secondary | ICD-10-CM | POA: Diagnosis not present

## 2015-03-01 DIAGNOSIS — R7989 Other specified abnormal findings of blood chemistry: Secondary | ICD-10-CM | POA: Insufficient documentation

## 2015-03-01 LAB — POCT I-STAT CREATININE: Creatinine, Ser: 0.9 mg/dL (ref 0.44–1.00)

## 2015-03-01 MED ORDER — GADOBENATE DIMEGLUMINE 529 MG/ML IV SOLN
15.0000 mL | Freq: Once | INTRAVENOUS | Status: AC | PRN
  Administered 2015-03-01: 14 mL via INTRAVENOUS

## 2015-03-06 ENCOUNTER — Ambulatory Visit (HOSPITAL_BASED_OUTPATIENT_CLINIC_OR_DEPARTMENT_OTHER): Payer: 59 | Admitting: Hematology

## 2015-03-06 ENCOUNTER — Encounter: Payer: Self-pay | Admitting: Hematology

## 2015-03-06 ENCOUNTER — Telehealth: Payer: Self-pay | Admitting: Hematology

## 2015-03-06 VITALS — BP 158/81 | HR 72 | Temp 97.9°F | Resp 18 | Wt 150.5 lb

## 2015-03-06 DIAGNOSIS — R0789 Other chest pain: Secondary | ICD-10-CM

## 2015-03-06 DIAGNOSIS — Z853 Personal history of malignant neoplasm of breast: Secondary | ICD-10-CM | POA: Diagnosis not present

## 2015-03-06 MED ORDER — HYDROCODONE-ACETAMINOPHEN 2.5-500 MG PO TABS: 1.0000 | ORAL_TABLET | Freq: Four times a day (QID) | ORAL | Status: DC | PRN

## 2015-03-06 NOTE — Telephone Encounter (Signed)
per pof to see pt on as needed basis-no f/u [facility]

## 2015-03-09 NOTE — Progress Notes (Signed)
[facility]  HEMATOLOGY ONCOLOGY PROGRESS NOTE  Date of service:.03/06/2015  Patient Care Team: Drenda Freeze, MD as PCP - General (Internal Medicine)  DIAGNOSIS:   1)Right breast DCIS status post lumpectomy and radiation in 1995/96. No evidence of disease recurrence at this time.\ 2) Right sided chest wall pain and discomfort under the right breast  Current treatment::  -Observation and annual mammographic screening for DCIS -When necessary Vicodin for chest wall pain  INTERVAL HISTORY: Stephanie Bautista is here for follow-up with her husband today. She had her MRI of the T-spine and chest to evaluate her right chest wall discomfort, paresthesias and burning pain. No overt chest wall pathology/impingement/breast pathology or overt rib pathology noted. MRI of the T-spine showed some facet arthropathy but no obvious nerve impingement. Patient notes that her symptoms continue to be positional. The lidocaine patch did not help much. She notes that she is to be seeing Dr. Leeroy Cha for addressing her cervical spine arthritis issues. No other acute new symptoms noted.  REVIEW OF SYSTEMS:    10 Point review of systems of done and is negative except as noted above.  . Past Medical History  Diagnosis Date  . Adenomatous colon polyp 12/2002  . Hemorrhoids   . Inguinal hernia   . Breast cancer ([place])   . Cholelithiases   . GERD (gastroesophageal reflux disease)   . Arthritis   . Esophageal stricture   . Hiatal hernia   . UTI (lower urinary tract infection)     . Past Surgical History  Procedure Laterality Date  . Breast lumpectomy    . Inguinal hernia repair  1968  . Hemorrhoid surgery  2007  . Partial hysterectomy  1996  . Cesarean section      2x  . Tonsillectomy    . Cholecystectomy  2008    . Social History  Substance Use Topics  . Smoking status: Never Smoker   . Smokeless tobacco: Never Used  . Alcohol Use: No    ALLERGIES:  is allergic to amoxicillin.  MEDICATIONS:    Current Outpatient Prescriptions  Medication Sig Dispense Refill  . HYDROcodone-acetaminophen (VICODIN) 2.5-500 MG tablet Take 1 tablet by mouth every 6 (six) hours as needed. 30 tablet 0   No current facility-administered medications for this visit.    PHYSICAL EXAMINATION: ECOG PERFORMANCE STATUS: 1 - Symptomatic but completely ambulatory  Filed Vitals:   03/06/15 1045  BP: 158/81  Pulse: 72  Temp: 97.9 F (36.6 C)  Resp: 18   Filed Weights   03/06/15 1045  Weight: 150 lb 8 oz (68.266 kg)   .Body mass index is 26.43 kg/(m^2).  GENERAL:alert, in no acute distress and comfortable SKIN: skin color, texture, turgor are normal, no rashes or significant lesions EYES: normal, conjunctiva are pink and non-injected, sclera clear OROPHARYNX:no exudate, no erythema and lips, buccal mucosa, and tongue normal  NECK: supple, no JVD, thyroid normal size, non-tender, without nodularity LYMPH:  no palpable lymphadenopathy in the cervical, axillary or inguinal LUNGS: clear to auscultation with normal respiratory effort HEART: regular rate & rhythm,  no murmurs and no lower extremity edema ABDOMEN: abdomen soft, non-tender, normoactive bowel sounds  Musculoskeletal: no cyanosis of digits and no clubbing  PSYCH: alert & oriented x 3 with fluent speech NEURO: no focal motor/sensory deficits  LABORATORY DATA:   I have reviewed the data as listed  RADIOGRAPHIC STUDIES: I have personally reviewed the radiological images as listed and agreed with the findings in the report. Mr Chest W  Wo Contrast  03/01/2015  CLINICAL DATA:  Right rib pain extending through to the back through 68 years. History of acoustic neuroma and possible nerve sheath tumors. EXAM: MRI CHEST WITHOUT AND WITH CONTRAST TECHNIQUE: Multiplanar multi sequence MR images were obtained through the chest foreign after IV contrast administration. CONTRAST:  14 cc MultiHance COMPARISON:  None. FINDINGS: Extensive motion artifact  along the anterior chest from breathing and cardiac motion rib reduces diagnostic sensitivity. No tumor is visualized along the right chest wall or right intercostal spaces. There appears to be some mild peripheral scarring in the right upper lobe as on image 40 series 1000. Heart size normal. No pathologic thoracic adenopathy identified. A band across the ascending thoracic aorta on image 44 series 1200 appears to represent artifact and not a dissection. IMPRESSION: 1. No abnormality identified along the chest wall to correlate with the patient's symptoms. Breathing and cardiac motion adversely affect diagnostic sensitivity. 2. Mild scarring or atelectasis in the right upper lobe posteriorly. Electronically Signed   By: Van Clines M.D.   On: 03/01/2015 10:15   Mr Thoracic Spine W Wo Contrast  03/01/2015  CLINICAL DATA:  Chronic thoracic back pain extending into the right ribs. EXAM: MRI THORACIC SPINE WITHOUT AND WITH CONTRAST TECHNIQUE: Multiplanar and multiecho pulse sequences of the thoracic spine were obtained without and with intravenous contrast. CONTRAST:  71mL MULTIHANCE GADOBENATE DIMEGLUMINE 529 MG/ML IV SOLN COMPARISON:  Chest x-ray dated 12/27/2014 and MRI of thoracic spine dated 07/30/2009 FINDINGS: The thoracic spinal cord appears normal. There is no mass lesion or myelopathy. No spinal or foraminal stenosis. There is slight facet arthritis on the right at T7-8 and T8-9. There is slight enhancement of those joints after contrast administration. There is no impingement upon the neural foramina. The paraspinal soft tissues are normal. Slight degenerative changes of the anterior aspect of the vertebral endplates at V6-9. No disc protrusions or disc bulges. Slight right facet arthritis from T1-2 through T4-5. These joints do not enhance after contrast. No mass lesions.  Specifically, no evidence of a neuroma. IMPRESSION: 1. Facet arthritis at T7-8 and T8-9 on the right with slight inflammation  of those joints, new since 2011. No neural impingement. 2. Slight right facet arthritis in the upper thoracic spine minimally progressed. 3. Otherwise, normal exam. Electronically Signed   By: Lorriane Shire M.D.   On: 03/01/2015 10:41    ASSESSMENT & PLAN:   65 year old female with  1. Right breast DCIS status post lumpectomy and radiation in 1995/96. No evidence of disease recurrence at this time. 2. Right sided chest wall pain and discomfort under the right breast. Patient's symptoms suggest she might have had shingles in the area before and this could be post herpetic neuralgia.Patient notes that her symptoms get positionally worse which might suggest a dynamic/positional intercostal nerve impingement. MRI of the T-spine showed some facet arthropathy as noted above but no neural impingement. MRI of the chest did not show any overt chest wall pathology.  Plan -Patient was offered the option of considering Cymbalta versus Neurontin or Lyrica for neuropathic pain. She notes that she does not like to take chronic medications. -Alternative approach be to consider intercostal nerve block. -Patient notes that she is noted to be seeing Dr. Leeroy Cha neurosurgery for her C-spine issues. I suggested she have a discussion with him regarding the MRI of her T-spine to see if that is any impingement issues or other factors causing her pain that he might be able to  address. Possibly might consider steroid injection for her facet arthropathy or if there is any neural inflammation. One could also potentially consider intercostal nerve block/ablation for targeted pain control. -Given prescription for when necessary Vicodin pending visit with Dr. Leeroy Cha. -Continue follow-up with primary care physician.  We'll not schedule follow-up with our clinic at this time since the patient chooses to continue her follow-up with her primary care physician and with her neurosurgeon which is quite reasonable.  I  spent 15 minutes counseling the patient face to face. The total time spent in the appointment was 20 minutes and more than 50% was on counseling and direct patient cares.    Sullivan Lone MD [place] AAHIVMS [facility] Hematology/Oncology Physician [facility] -   (Office):       [phone] (Work cell):  [phone] (Fax):           [phone]

## 2015-05-18 ENCOUNTER — Ambulatory Visit (INDEPENDENT_AMBULATORY_CARE_PROVIDER_SITE_OTHER): Payer: 59 | Admitting: Family Medicine

## 2015-05-18 VITALS — BP 136/74 | HR 65 | Temp 97.9°F | Resp 16 | Ht 64.0 in | Wt 151.0 lb

## 2015-05-18 DIAGNOSIS — J309 Allergic rhinitis, unspecified: Secondary | ICD-10-CM | POA: Diagnosis not present

## 2015-05-18 DIAGNOSIS — J0100 Acute maxillary sinusitis, unspecified: Secondary | ICD-10-CM

## 2015-05-18 DIAGNOSIS — K029 Dental caries, unspecified: Secondary | ICD-10-CM | POA: Diagnosis not present

## 2015-05-18 MED ORDER — HYDROCODONE-ACETAMINOPHEN 5-325 MG PO TABS: 1.0000 | ORAL_TABLET | Freq: Three times a day (TID) | ORAL | Status: DC | PRN

## 2015-05-18 MED ORDER — CLINDAMYCIN HCL 150 MG PO CAPS: 450.0000 mg | ORAL_CAPSULE | Freq: Three times a day (TID) | ORAL | Status: DC

## 2015-05-18 MED ORDER — CLINDAMYCIN HCL 150 MG PO CAPS: 150.0000 mg | ORAL_CAPSULE | Freq: Three times a day (TID) | ORAL | Status: DC

## 2015-05-18 MED ORDER — CLINDAMYCIN HCL 300 MG PO CAPS
300.0000 mg | ORAL_CAPSULE | Freq: Three times a day (TID) | ORAL | Status: DC
Start: 2015-05-18 — End: 2015-05-18

## 2015-05-18 MED ORDER — HYDROCODONE-ACETAMINOPHEN 2.5-500 MG PO TABS: 1.0000 | ORAL_TABLET | Freq: Four times a day (QID) | ORAL | Status: DC | PRN

## 2015-05-18 MED ORDER — AZITHROMYCIN 250 MG PO TABS: ORAL_TABLET | ORAL | Status: DC

## 2015-05-18 NOTE — Progress Notes (Signed)
Chief Complaint:  Chief Complaint  Patient presents with  . Facial Swelling    left, since yesterday, possible dental issue  . Nasal Congestion    HPI: Stephanie Bautista is a 64 y.o. female who reports to [facility] today complaining of left sided facial swelling  She has ahd nasal congestion and sinus xs but hat was 2 weeks ago. She had some dental pain and this was when the facial swelling occurred.  She has some sensitivity to the tooth in her canine  With touch and food/liquids. No fvers or chills. Noe ar pain. No PND   Past Medical History  Diagnosis Date  . Adenomatous colon polyp 12/2002  . Hemorrhoids   . Inguinal hernia   . Breast cancer ([place])   . Cholelithiases   . GERD (gastroesophageal reflux disease)   . Arthritis   . Esophageal stricture   . Hiatal hernia   . UTI (lower urinary tract infection)    Past Surgical History  Procedure Laterality Date  . Breast lumpectomy    . Inguinal hernia repair  1968  . Hemorrhoid surgery  2007  . Partial hysterectomy  1996  . Cesarean section      2x  . Tonsillectomy    . Cholecystectomy  2008   Social History   Social History  . Marital Status: Married    Spouse Name: N/A  . Number of Children: N/A  . Years of Education: N/A   Occupational History  . office    Social History Main Topics  . Smoking status: Never Smoker   . Smokeless tobacco: Never Used  . Alcohol Use: No  . Drug Use: No  . Sexual Activity: Not Asked   Other Topics Concern  . None   Social History Narrative   Family History  Problem Relation Age of Onset  . Cancer      ? Grandmother  . Diabetes    . Heart disease Mother    Allergies  Allergen Reactions  . Amoxicillin Itching and Rash   Prior to Admission medications   Medication Sig Start Date End Date Taking? Authorizing Provider  clindamycin (CLEOCIN) 150 MG capsule Take 3 capsules (450 mg total) by mouth 3 (three) times daily. 05/18/15   Devon Pretty P Jyla Hopf, DO  HYDROcodone-acetaminophen  (NORCO) 5-325 MG tablet Take 1 tablet by mouth every 8 (eight) hours as needed for moderate pain. Take with stool softener 05/18/15   Wilber Fini P Zyree Traynham, DO     ROS: The patient denies fevers, chills, night sweats, unintentional weight loss, chest pain, palpitations, wheezing, dyspnea on exertion, nausea, vomiting, abdominal pain, dysuria, hematuria, melena, numbness, weakness, or tingling.   All other systems have been reviewed and were otherwise negative with the exception of those mentioned in the HPI and as above.    PHYSICAL EXAM: Filed Vitals:   05/18/15 1109  BP: 136/74  Pulse: 65  Temp: 97.9 F (36.6 C)  Resp: 16   Body mass index is 25.91 kg/(m^2).   General: Alert, no acute distress HEENT:  Normocephalic, atraumatic, oropharynx patent. EOMI, PERRLA Erythematous throat, no exudates, TM normal, +/- sinus tenderness, + erythematous/boggy nasal mucosa + left canine tenderness with palpation, minimal gingival swelling, minimal left facial swelling Cardiovascular:  Regular rate and rhythm, no rubs murmurs or gallops.  No Carotid bruits, radial pulse intact. No pedal edema.  Respiratory: Clear to auscultation bilaterally.  No wheezes, rales, or rhonchi.  No cyanosis, no use of accessory musculature Abdominal:  No organomegaly, abdomen is soft and non-tender, positive bowel sounds. No masses. Skin: No rashes. Neurologic: Facial musculature symmetric. Psychiatric: Patient acts appropriately throughout our interaction. Lymphatic: No cervical or submandibular lymphadenopathy Musculoskeletal: Gait intact. No edema, tenderness   LABS: Results for orders placed or performed during the hospital encounter of 03/01/15  I-STAT creatinine  Result Value Ref Range   Creatinine, Ser 0.90 0.44 - 1.00 mg/dL     EKG/XRAY:   Primary read interpreted by Dr. Marin Comment at [facility].   ASSESSMENT/PLAN: Encounter Diagnoses  Name Primary?  . Acute maxillary sinusitis, recurrence not specified Yes  . Dental  caries   . Allergic rhinitis, unspecified allergic rhinitis type    She may have had sinusitis but she has some dental involvement with acute facial swelling and dental and gingival pain Rx clindamycin , salt water gargles, probiotics, go see dentist Rx norco prn for pain Fu prn   Gross sideeffects, risk and benefits, and alternatives of medications d/w patient. Patient is aware that all medications have potential sideeffects and we are unable to predict every sideeffect or drug-drug interaction that may occur.  Abriel Geesey DO  05/18/2015 12:26 PM

## 2015-05-18 NOTE — Patient Instructions (Addendum)
Sinusitis, Adult Sinusitis is redness, soreness, and inflammation of the paranasal sinuses. Paranasal sinuses are air pockets within the bones of your face. They are located beneath your eyes, in the middle of your forehead, and above your eyes. In healthy paranasal sinuses, mucus is able to drain out, and air is able to circulate through them by way of your nose. However, when your paranasal sinuses are inflamed, mucus and air can become trapped. This can allow bacteria and other germs to grow and cause infection. Sinusitis can develop quickly and last only a short time (acute) or continue over a long period (chronic). Sinusitis that lasts for more than 12 weeks is considered chronic. CAUSES Causes of sinusitis include:  Allergies.  Structural abnormalities, such as displacement of the cartilage that separates your nostrils (deviated septum), which can decrease the air flow through your nose and sinuses and affect sinus drainage.  Functional abnormalities, such as when the small hairs (cilia) that line your sinuses and help remove mucus do not work properly or are not present. SIGNS AND SYMPTOMS Symptoms of acute and chronic sinusitis are the same. The primary symptoms are pain and pressure around the affected sinuses. Other symptoms include:  Upper toothache.  Earache.  Headache.  Bad breath.  Decreased sense of smell and taste.  A cough, which worsens when you are lying flat.  Fatigue.  Fever.  Thick drainage from your nose, which often is green and may contain pus (purulent).  Swelling and warmth over the affected sinuses. DIAGNOSIS Your health care provider will perform a physical exam. During your exam, your health care provider may perform any of the following to help determine if you have acute sinusitis or chronic sinusitis:  Look in your nose for signs of abnormal growths in your nostrils (nasal polyps).  Tap over the affected sinus to check for signs of  infection.  View the inside of your sinuses using an imaging device that has a light attached (endoscope). If your health care provider suspects that you have chronic sinusitis, one or more of the following tests may be recommended:  Allergy tests.  Nasal culture. A sample of mucus is taken from your nose, sent to a lab, and screened for bacteria.  Nasal cytology. A sample of mucus is taken from your nose and examined by your health care provider to determine if your sinusitis is related to an allergy. TREATMENT Most cases of acute sinusitis are related to a viral infection and will resolve on their own within 10 days. Sometimes, medicines are prescribed to help relieve symptoms of both acute and chronic sinusitis. These may include pain medicines, decongestants, nasal steroid sprays, or saline sprays. However, for sinusitis related to a bacterial infection, your health care provider will prescribe antibiotic medicines. These are medicines that will help kill the bacteria causing the infection. Rarely, sinusitis is caused by a fungal infection. In these cases, your health care provider will prescribe antifungal medicine. For some cases of chronic sinusitis, surgery is needed. Generally, these are cases in which sinusitis recurs more than 3 times per year, despite other treatments. HOME CARE INSTRUCTIONS  Drink plenty of water. Water helps thin the mucus so your sinuses can drain more easily.  Use a humidifier.  Inhale steam 3-4 times a day (for example, sit in the bathroom with the shower running).  Apply a warm, moist washcloth to your face 3-4 times a day, or as directed by your health care provider.  Use saline nasal sprays to help   moisten and clean your sinuses.  Take medicines only as directed by your health care provider.  If you were prescribed either an antibiotic or antifungal medicine, finish it all even if you start to feel better. SEEK IMMEDIATE MEDICAL CARE IF:  You have  increasing pain or severe headaches.  You have nausea, vomiting, or drowsiness.  You have swelling around your face.  You have vision problems.  You have a stiff neck.  You have difficulty breathing.   This information is not intended to replace advice given to you by your health care provider. Make sure you discuss any questions you have with your health care provider.   Document Released: 05/11/2005 Document Revised: 06/01/2014 Document Reviewed: 05/26/2011 Elsevier Interactive Patient Education 2016 [place] [employer] Dental caries (also called tooth decay) is the most common oral disease. It can occur at any age but is more common in children and young adults.  HOW DENTAL CARIES DEVELOPS  The process of decay begins when bacteria and foods (particularly sugars and starches) combine in your mouth to produce plaque. Plaque is a substance that sticks to the hard, outer surface of a tooth (enamel). The bacteria in plaque produce acids that attack enamel. These acids may also attack the root surface of a tooth (cementum) if it is exposed. Repeated attacks dissolve these surfaces and create holes in the tooth (cavities). If left untreated, the acids destroy the other layers of the tooth.  RISK FACTORS  Frequent sipping of sugary beverages.   Frequent snacking on sugary and starchy foods, especially those that easily get stuck in the teeth.   Poor oral hygiene.   Dry mouth.   Substance abuse such as methamphetamine abuse.   Broken or poor-fitting dental restorations.   Eating disorders.   Gastroesophageal reflux disease (GERD).   Certain radiation treatments to the head and neck. SYMPTOMS In the early stages of dental caries, symptoms are seldom present. Sometimes white, chalky areas may be seen on the enamel or other tooth layers. In later stages, symptoms may include:  Pits and holes on the enamel.  Toothache after sweet, hot, or cold foods or drinks are  consumed.  Pain around the tooth.  Swelling around the tooth. DIAGNOSIS  Most of the time, dental caries is detected during a regular dental checkup. A diagnosis is made after a thorough medical and dental history is taken and the surfaces of your teeth are checked for signs of dental caries. Sometimes special instruments, such as lasers, are used to check for dental caries. Dental X-ray exams may be taken so that areas not visible to the eye (such as between the contact areas of the teeth) can be checked for cavities.  TREATMENT  If dental caries is in its early stages, it may be reversed with a fluoride treatment or an application of a remineralizing agent at the dental office. Thorough brushing and flossing at home is needed to aid these treatments. If it is in its later stages, treatment depends on the location and extent of tooth destruction:   If a small area of the tooth has been destroyed, the destroyed area will be removed and cavities will be filled with a material such as gold, silver amalgam, or composite resin.   If a large area of the tooth has been destroyed, the destroyed area will be removed and a cap (crown) will be fitted over the remaining tooth structure.   If the center part of the tooth (pulp) is affected, a  procedure called a root canal will be needed before a filling or crown can be placed.   If most of the tooth has been destroyed, the tooth may need to be pulled (extracted). HOME CARE INSTRUCTIONS You can prevent, stop, or reverse dental caries at home by practicing good oral hygiene. Good oral hygiene includes:  Thoroughly cleaning your teeth at least twice a day with a toothbrush and dental floss.   Using a fluoride toothpaste. A fluoride mouth rinse may also be used if recommended by your dentist or health care provider.   Restricting the amount of sugary and starchy foods and sugary liquids you consume.   Avoiding frequent snacking on these foods and  sipping of these liquids.   Keeping regular visits with a dentist for checkups and cleanings. PREVENTION   Practice good oral hygiene.  Consider a dental sealant. A dental sealant is a coating material that is applied by your dentist to the pits and grooves of teeth. The sealant prevents food from being trapped in them. It may protect the teeth for several years.  Ask about fluoride supplements if you live in a community without fluorinated water or with water that has a low fluoride content. Use fluoride supplements as directed by your dentist or health care provider.  Allow fluoride varnish applications to teeth if directed by your dentist or health care provider.   This information is not intended to replace advice given to you by your health care provider. Make sure you discuss any questions you have with your health care provider.   Document Released: 01/31/2002 Document Revised: 06/01/2014 Document Reviewed: 05/13/2012 Elsevier Interactive Patient Education 2016 Elsevier Inc. Clindamycin capsules What is this medicine? CLINDAMYCIN ([place] sin) is a lincosamide antibiotic. It is used to treat certain kinds of bacterial infections. It will not work for colds, flu, or other viral infections. This medicine may be used for other purposes; ask your health care provider or pharmacist if you have questions. What should I tell my health care provider before I take this medicine? They need to know if you have any of these conditions: -kidney disease -liver disease -stomach problems like colitis -an unusual or allergic reaction to clindamycin, lincomycin, or other medicines, foods, dyes like tartrazine or preservatives -pregnant or trying to get pregnant -breast-feeding How should I use this medicine? Take this medicine by mouth with a full glass of water. Follow the directions on the prescription label. You can take this medicine with food or on an empty stomach. If the medicine  upsets your stomach, take it with food. Take your medicine at regular intervals. Do not take your medicine more often than directed. Take all of your medicine as directed even if you think your are better. Do not skip doses or stop your medicine early. Talk to your pediatrician regarding the use of this medicine in children. Special care may be needed. Overdosage: If you think you have taken too much of this medicine contact a poison control center or emergency room at once. NOTE: This medicine is only for you. Do not share this medicine with others. What if I miss a dose? If you miss a dose, take it as soon as you can. If it is almost time for your next dose, take only that dose. Do not take double or extra doses. What may interact with this medicine? -birth control pills -chloramphenicol -erythromycin -kaolin products This list may not describe all possible interactions. Give your health care provider a list  of all the medicines, herbs, non-prescription drugs, or dietary supplements you use. Also tell them if you smoke, drink alcohol, or use illegal drugs. Some items may interact with your medicine. What should I watch for while using this medicine? Tell your doctor or healthcare professional if your symptoms do not start to get better or if they get worse. Do not treat diarrhea with over the counter products. Contact your doctor if you have diarrhea that lasts more than 2 days or if it is severe and watery. What side effects may I notice from receiving this medicine? Side effects that you should report to your doctor or health care professional as soon as possible: -allergic reactions like skin rash, itching or hives, swelling of the face, lips, or tongue -dark urine -pain on swallowing -redness, blistering, peeling or loosening of the skin, including inside the mouth -unusual bleeding or bruising -unusually weak or tired -yellowing of eyes or skin Side effects that usually do not require  medical attention (report to your doctor or health care professional if they continue or are bothersome): -diarrhea -itching in the rectal or genital area -joint pain -nausea, vomiting -stomach pain This list may not describe all possible side effects. Call your doctor for medical advice about side effects. You may report side effects to FDA at 1-800-FDA-1088. Where should I keep my medicine? Keep out of the reach of children. Store at room temperature between 20 and 25 degrees C (68 and 77 degrees F). Throw away any unused medicine after the expiration date. NOTE: This sheet is a summary. It may not cover all possible information. If you have questions about this medicine, talk to your doctor, pharmacist, or health care provider.    2016, Elsevier/Gold Standard. (2012-12-15 16:12:32)

## 2015-07-25 ENCOUNTER — Encounter: Payer: Self-pay | Admitting: Gastroenterology

## 2015-08-20 DIAGNOSIS — G5762 Lesion of plantar nerve, left lower limb: Secondary | ICD-10-CM | POA: Diagnosis not present

## 2015-08-20 DIAGNOSIS — M25572 Pain in left ankle and joints of left foot: Secondary | ICD-10-CM | POA: Diagnosis not present

## 2015-09-24 DIAGNOSIS — R079 Chest pain, unspecified: Secondary | ICD-10-CM | POA: Diagnosis not present

## 2015-09-24 DIAGNOSIS — N952 Postmenopausal atrophic vaginitis: Secondary | ICD-10-CM | POA: Diagnosis not present

## 2015-09-24 DIAGNOSIS — N390 Urinary tract infection, site not specified: Secondary | ICD-10-CM | POA: Diagnosis not present

## 2016-01-07 DIAGNOSIS — Z6821 Body mass index (BMI) 21.0-21.9, adult: Secondary | ICD-10-CM | POA: Diagnosis not present

## 2016-01-07 DIAGNOSIS — Z01419 Encounter for gynecological examination (general) (routine) without abnormal findings: Secondary | ICD-10-CM | POA: Diagnosis not present

## 2016-01-07 DIAGNOSIS — B373 Candidiasis of vulva and vagina: Secondary | ICD-10-CM | POA: Diagnosis not present

## 2016-01-07 DIAGNOSIS — R351 Nocturia: Secondary | ICD-10-CM | POA: Diagnosis not present

## 2016-01-07 DIAGNOSIS — Z124 Encounter for screening for malignant neoplasm of cervix: Secondary | ICD-10-CM | POA: Diagnosis not present

## 2016-02-28 DIAGNOSIS — M47816 Spondylosis without myelopathy or radiculopathy, lumbar region: Secondary | ICD-10-CM | POA: Diagnosis not present

## 2016-02-28 DIAGNOSIS — M7061 Trochanteric bursitis, right hip: Secondary | ICD-10-CM | POA: Diagnosis not present

## 2016-03-03 DIAGNOSIS — M6281 Muscle weakness (generalized): Secondary | ICD-10-CM | POA: Diagnosis not present

## 2016-03-03 DIAGNOSIS — M545 Low back pain: Secondary | ICD-10-CM | POA: Diagnosis not present

## 2016-03-03 DIAGNOSIS — R262 Difficulty in walking, not elsewhere classified: Secondary | ICD-10-CM | POA: Diagnosis not present

## 2016-03-03 DIAGNOSIS — M25551 Pain in right hip: Secondary | ICD-10-CM | POA: Diagnosis not present

## 2016-03-05 DIAGNOSIS — D224 Melanocytic nevi of scalp and neck: Secondary | ICD-10-CM | POA: Diagnosis not present

## 2016-03-05 DIAGNOSIS — Z23 Encounter for immunization: Secondary | ICD-10-CM | POA: Diagnosis not present

## 2016-03-05 DIAGNOSIS — D2221 Melanocytic nevi of right ear and external auricular canal: Secondary | ICD-10-CM | POA: Diagnosis not present

## 2016-03-05 DIAGNOSIS — Z808 Family history of malignant neoplasm of other organs or systems: Secondary | ICD-10-CM | POA: Diagnosis not present

## 2016-03-05 DIAGNOSIS — D485 Neoplasm of uncertain behavior of skin: Secondary | ICD-10-CM | POA: Diagnosis not present

## 2016-03-05 DIAGNOSIS — Z86018 Personal history of other benign neoplasm: Secondary | ICD-10-CM | POA: Diagnosis not present

## 2016-03-05 DIAGNOSIS — D2261 Melanocytic nevi of right upper limb, including shoulder: Secondary | ICD-10-CM | POA: Diagnosis not present

## 2016-03-05 DIAGNOSIS — D225 Melanocytic nevi of trunk: Secondary | ICD-10-CM | POA: Diagnosis not present

## 2016-03-05 DIAGNOSIS — D2272 Melanocytic nevi of left lower limb, including hip: Secondary | ICD-10-CM | POA: Diagnosis not present

## 2016-03-06 DIAGNOSIS — M6281 Muscle weakness (generalized): Secondary | ICD-10-CM | POA: Diagnosis not present

## 2016-03-06 DIAGNOSIS — M545 Low back pain: Secondary | ICD-10-CM | POA: Diagnosis not present

## 2016-03-06 DIAGNOSIS — M25551 Pain in right hip: Secondary | ICD-10-CM | POA: Diagnosis not present

## 2016-03-06 DIAGNOSIS — R262 Difficulty in walking, not elsewhere classified: Secondary | ICD-10-CM | POA: Diagnosis not present

## 2016-03-09 DIAGNOSIS — M6281 Muscle weakness (generalized): Secondary | ICD-10-CM | POA: Diagnosis not present

## 2016-03-09 DIAGNOSIS — M545 Low back pain: Secondary | ICD-10-CM | POA: Diagnosis not present

## 2016-03-09 DIAGNOSIS — R262 Difficulty in walking, not elsewhere classified: Secondary | ICD-10-CM | POA: Diagnosis not present

## 2016-03-09 DIAGNOSIS — M25551 Pain in right hip: Secondary | ICD-10-CM | POA: Diagnosis not present

## 2016-03-11 DIAGNOSIS — M25551 Pain in right hip: Secondary | ICD-10-CM | POA: Diagnosis not present

## 2016-03-11 DIAGNOSIS — M6281 Muscle weakness (generalized): Secondary | ICD-10-CM | POA: Diagnosis not present

## 2016-03-11 DIAGNOSIS — R262 Difficulty in walking, not elsewhere classified: Secondary | ICD-10-CM | POA: Diagnosis not present

## 2016-03-11 DIAGNOSIS — M545 Low back pain: Secondary | ICD-10-CM | POA: Diagnosis not present

## 2016-03-16 DIAGNOSIS — M25551 Pain in right hip: Secondary | ICD-10-CM | POA: Diagnosis not present

## 2016-03-16 DIAGNOSIS — M545 Low back pain: Secondary | ICD-10-CM | POA: Diagnosis not present

## 2016-03-16 DIAGNOSIS — M6281 Muscle weakness (generalized): Secondary | ICD-10-CM | POA: Diagnosis not present

## 2016-03-16 DIAGNOSIS — R262 Difficulty in walking, not elsewhere classified: Secondary | ICD-10-CM | POA: Diagnosis not present

## 2016-03-18 DIAGNOSIS — M47816 Spondylosis without myelopathy or radiculopathy, lumbar region: Secondary | ICD-10-CM | POA: Diagnosis not present

## 2016-03-18 DIAGNOSIS — M5440 Lumbago with sciatica, unspecified side: Secondary | ICD-10-CM | POA: Diagnosis not present

## 2016-03-18 DIAGNOSIS — R5383 Other fatigue: Secondary | ICD-10-CM | POA: Diagnosis not present

## 2016-03-20 DIAGNOSIS — R262 Difficulty in walking, not elsewhere classified: Secondary | ICD-10-CM | POA: Diagnosis not present

## 2016-03-20 DIAGNOSIS — M6281 Muscle weakness (generalized): Secondary | ICD-10-CM | POA: Diagnosis not present

## 2016-03-20 DIAGNOSIS — M545 Low back pain: Secondary | ICD-10-CM | POA: Diagnosis not present

## 2016-03-20 DIAGNOSIS — M25551 Pain in right hip: Secondary | ICD-10-CM | POA: Diagnosis not present

## 2016-03-23 DIAGNOSIS — M6281 Muscle weakness (generalized): Secondary | ICD-10-CM | POA: Diagnosis not present

## 2016-03-23 DIAGNOSIS — M545 Low back pain: Secondary | ICD-10-CM | POA: Diagnosis not present

## 2016-03-23 DIAGNOSIS — R262 Difficulty in walking, not elsewhere classified: Secondary | ICD-10-CM | POA: Diagnosis not present

## 2016-03-23 DIAGNOSIS — M25551 Pain in right hip: Secondary | ICD-10-CM | POA: Diagnosis not present

## 2016-03-25 DIAGNOSIS — R262 Difficulty in walking, not elsewhere classified: Secondary | ICD-10-CM | POA: Diagnosis not present

## 2016-03-25 DIAGNOSIS — M545 Low back pain: Secondary | ICD-10-CM | POA: Diagnosis not present

## 2016-03-25 DIAGNOSIS — M25551 Pain in right hip: Secondary | ICD-10-CM | POA: Diagnosis not present

## 2016-03-25 DIAGNOSIS — M6281 Muscle weakness (generalized): Secondary | ICD-10-CM | POA: Diagnosis not present

## 2016-03-27 DIAGNOSIS — M545 Low back pain: Secondary | ICD-10-CM | POA: Diagnosis not present

## 2016-04-01 DIAGNOSIS — M5106 Intervertebral disc disorders with myelopathy, lumbar region: Secondary | ICD-10-CM | POA: Diagnosis not present

## 2016-04-01 DIAGNOSIS — M810 Age-related osteoporosis without current pathological fracture: Secondary | ICD-10-CM | POA: Diagnosis not present

## 2016-04-02 DIAGNOSIS — R5383 Other fatigue: Secondary | ICD-10-CM | POA: Diagnosis not present

## 2016-04-02 DIAGNOSIS — E559 Vitamin D deficiency, unspecified: Secondary | ICD-10-CM | POA: Diagnosis not present

## 2016-04-02 DIAGNOSIS — M810 Age-related osteoporosis without current pathological fracture: Secondary | ICD-10-CM | POA: Diagnosis not present

## 2016-04-06 DIAGNOSIS — H04123 Dry eye syndrome of bilateral lacrimal glands: Secondary | ICD-10-CM | POA: Diagnosis not present

## 2016-04-06 DIAGNOSIS — M3501 Sicca syndrome with keratoconjunctivitis: Secondary | ICD-10-CM | POA: Diagnosis not present

## 2016-04-06 DIAGNOSIS — H25013 Cortical age-related cataract, bilateral: Secondary | ICD-10-CM | POA: Diagnosis not present

## 2016-04-06 DIAGNOSIS — H35033 Hypertensive retinopathy, bilateral: Secondary | ICD-10-CM | POA: Diagnosis not present

## 2016-04-07 DIAGNOSIS — M545 Low back pain: Secondary | ICD-10-CM | POA: Diagnosis not present

## 2016-04-07 DIAGNOSIS — M7061 Trochanteric bursitis, right hip: Secondary | ICD-10-CM | POA: Diagnosis not present

## 2016-04-07 DIAGNOSIS — M5106 Intervertebral disc disorders with myelopathy, lumbar region: Secondary | ICD-10-CM | POA: Diagnosis not present

## 2016-04-09 DIAGNOSIS — M25551 Pain in right hip: Secondary | ICD-10-CM | POA: Diagnosis not present

## 2016-04-09 DIAGNOSIS — M545 Low back pain: Secondary | ICD-10-CM | POA: Diagnosis not present

## 2016-04-09 DIAGNOSIS — M6281 Muscle weakness (generalized): Secondary | ICD-10-CM | POA: Diagnosis not present

## 2016-04-09 DIAGNOSIS — R262 Difficulty in walking, not elsewhere classified: Secondary | ICD-10-CM | POA: Diagnosis not present

## 2016-04-14 DIAGNOSIS — M545 Low back pain: Secondary | ICD-10-CM | POA: Diagnosis not present

## 2016-04-14 DIAGNOSIS — M25551 Pain in right hip: Secondary | ICD-10-CM | POA: Diagnosis not present

## 2016-04-14 DIAGNOSIS — M6281 Muscle weakness (generalized): Secondary | ICD-10-CM | POA: Diagnosis not present

## 2016-04-14 DIAGNOSIS — R262 Difficulty in walking, not elsewhere classified: Secondary | ICD-10-CM | POA: Diagnosis not present

## 2016-04-15 DIAGNOSIS — Z853 Personal history of malignant neoplasm of breast: Secondary | ICD-10-CM | POA: Diagnosis not present

## 2016-04-15 DIAGNOSIS — Z1231 Encounter for screening mammogram for malignant neoplasm of breast: Secondary | ICD-10-CM | POA: Diagnosis not present

## 2016-04-20 ENCOUNTER — Ambulatory Visit (INDEPENDENT_AMBULATORY_CARE_PROVIDER_SITE_OTHER): Payer: Medicare Other | Admitting: Gastroenterology

## 2016-04-20 ENCOUNTER — Encounter (INDEPENDENT_AMBULATORY_CARE_PROVIDER_SITE_OTHER): Payer: Self-pay

## 2016-04-20 ENCOUNTER — Encounter: Payer: Self-pay | Admitting: Gastroenterology

## 2016-04-20 VITALS — BP 120/72 | HR 72 | Ht 62.8 in | Wt 125.5 lb

## 2016-04-20 DIAGNOSIS — R0789 Other chest pain: Secondary | ICD-10-CM

## 2016-04-20 DIAGNOSIS — R1314 Dysphagia, pharyngoesophageal phase: Secondary | ICD-10-CM

## 2016-04-20 DIAGNOSIS — R0989 Other specified symptoms and signs involving the circulatory and respiratory systems: Secondary | ICD-10-CM

## 2016-04-20 DIAGNOSIS — Z86010 Personal history of colonic polyps: Secondary | ICD-10-CM

## 2016-04-20 DIAGNOSIS — F458 Other somatoform disorders: Secondary | ICD-10-CM

## 2016-04-20 DIAGNOSIS — K219 Gastro-esophageal reflux disease without esophagitis: Secondary | ICD-10-CM | POA: Diagnosis not present

## 2016-04-20 DIAGNOSIS — R142 Eructation: Secondary | ICD-10-CM | POA: Diagnosis not present

## 2016-04-20 MED ORDER — OMEPRAZOLE 40 MG PO CPDR: 40.0000 mg | DELAYED_RELEASE_CAPSULE | Freq: Two times a day (BID) | ORAL | 11 refills | Status: DC

## 2016-04-20 NOTE — Progress Notes (Signed)
History of Present Illness: This is a 65 year old female here for the evaluation of GERD. She has a long history of GERD, last evaluated office visit in 2012. She underwent EGDs in 1996, 2002, 2005 and 2008 for GERD and dysphagia too. In 2002 an esophageal stricture was dilated. In 2005 no stricture was noted however an empiric dilation was performed. She states she has not been on reflux treatment for several years. She took a course of prednisone for lower back pain and her reflux symptoms flared with heartburn and significant belching. She is also noted dysphagia to large pills and solid foods for the past several weeks. Also notes a swollen feeling in her neck and she localizes her difficulty swallowing to her neck. She states she is taken about 3 months of Nexium 20 mg daily with no significant change in symptoms. She has been treated with NSAIDs for her back pain. She notes a focal burning sensation in her left lower chest. She states the pain radiates straight through to her back and is knife-like in character. She states this pain has been there for years. In addition, she has a history of 1 adenomatous colon polyp, diagnosed in 2004. She did not return for recommended colonoscopy in 2009. She was advised to colonoscopy again at her 2012 office visit however she did not schedule. Denies weight loss, abdominal pain, constipation, diarrhea, change in stool caliber, melena, hematochezia, nausea, vomiting.   Allergies  Allergen Reactions  . Amoxicillin Itching and Rash   Outpatient Medications Prior to Visit  Medication Sig Dispense Refill  . clindamycin (CLEOCIN) 150 MG capsule Take 3 capsules (450 mg total) by mouth 3 (three) times daily. 90 capsule 0  . HYDROcodone-acetaminophen (NORCO) 5-325 MG tablet Take 1 tablet by mouth every 8 (eight) hours as needed for moderate pain. Take with stool softener 30 tablet 0   No facility-administered medications prior to visit.    Past Medical History:   Diagnosis Date  . Adenomatous colon polyp 12/2002  . Arthritis   . Breast cancer ([place])   . Cholelithiases   . Esophageal stricture   . GERD (gastroesophageal reflux disease)   . Hemorrhoids   . Hiatal hernia   . Inguinal hernia   . UTI (lower urinary tract infection)    Past Surgical History:  Procedure Laterality Date  . BREAST LUMPECTOMY    . CESAREAN SECTION     2x  . CHOLECYSTECTOMY  2008  . HEMORRHOID SURGERY  2007  . INGUINAL HERNIA REPAIR  1968  . PARTIAL HYSTERECTOMY  1996  . TONSILLECTOMY     Social History   Social History  . Marital status: Married    Spouse name: N/A  . Number of children: 2  . Years of education: N/A   Occupational History  . office [employer]   Social History Main Topics  . Smoking status: Never Smoker  . Smokeless tobacco: Never Used  . Alcohol use No  . Drug use: No  . Sexual activity: Not Asked   Other Topics Concern  . None   Social History Narrative  . None   Family History  Problem Relation Age of Onset  . Cancer      ? Grandmother  . Diabetes    . Heart disease Mother      Review of Systems: Pertinent positive and negative review of systems were noted in the above HPI section. All other review of systems were otherwise negative   Physical Exam:  General: Well developed, well nourished, no acute distress Head: Normocephalic and atraumatic Eyes:  sclerae anicteric, EOMI Ears: Normal auditory acuity Mouth: No deformity or lesions Neck: Supple, no masses or thyromegaly Lungs: Clear throughout to auscultation Chest: point tenderness immediately left of lower sternum Heart: Regular rate and rhythm; no murmurs, rubs or bruits Abdomen: Soft, non tender and non distended. No masses, hepatosplenomegaly or hernias noted. Normal Bowel sounds Rectal: deferred to colonoscopy Musculoskeletal: Symmetrical with no gross deformities  Skin: No lesions on visible extremities Pulses:  Normal pulses noted Extremities: No  clubbing, cyanosis, edema or deformities noted Neurological: Alert oriented x 4, grossly nonfocal Cervical Nodes:  No significant cervical adenopathy Inguinal Nodes: No significant inguinal adenopathy Psychological:  Alert and cooperative. Normal mood and affect  Assessment and Recommendations:  1. GERD, dysphagia to solids/pills, globus sensation, belching. Begin omeprazole 40 mg twice daily and follow all standard antireflux measures. Gas-X 4 times a day when necessary. Consider EGD, MBSS and barium esophagram if symptoms not improved. REV in 3 weeks.   2. Chest pain. Musculoskeletal and GI related. See #1. Discontinue NSAIDs. Begin Tylenol for pain. PCP to further evaluate chest pain.   3. Personal history of adenomatous colon polyps. She is long overdue for surveillance colonoscopy. At her REV we will decide on colonoscopy alone or colonoscopy with EGD.

## 2016-04-20 NOTE — Patient Instructions (Signed)
We have sent the following medications to your pharmacy for you to pick up at your convenience: omeprazole.  Patient advised to avoid spicy, acidic, citrus, chocolate, mints, fruit and fruit juices.  Limit the intake of caffeine, alcohol and Soda.  Don't exercise too soon after eating.  Don't lie down within 3-4 hours of eating.  Elevate the head of your bed.  Discontinue NSAID's and use Tylenol in it's place.  Thank you for choosing me and [facility].  Pricilla Riffle. Dagoberto Ligas., MD., [facility]

## 2016-04-21 DIAGNOSIS — H903 Sensorineural hearing loss, bilateral: Secondary | ICD-10-CM | POA: Diagnosis not present

## 2016-04-21 DIAGNOSIS — D333 Benign neoplasm of cranial nerves: Secondary | ICD-10-CM | POA: Diagnosis not present

## 2016-04-21 DIAGNOSIS — R930 Abnormal findings on diagnostic imaging of skull and head, not elsewhere classified: Secondary | ICD-10-CM | POA: Diagnosis not present

## 2016-04-21 DIAGNOSIS — H905 Unspecified sensorineural hearing loss: Secondary | ICD-10-CM | POA: Diagnosis not present

## 2016-04-21 DIAGNOSIS — H608X2 Other otitis externa, left ear: Secondary | ICD-10-CM | POA: Diagnosis not present

## 2016-04-23 DIAGNOSIS — M6281 Muscle weakness (generalized): Secondary | ICD-10-CM | POA: Diagnosis not present

## 2016-04-23 DIAGNOSIS — M545 Low back pain: Secondary | ICD-10-CM | POA: Diagnosis not present

## 2016-04-23 DIAGNOSIS — M25551 Pain in right hip: Secondary | ICD-10-CM | POA: Diagnosis not present

## 2016-04-23 DIAGNOSIS — R262 Difficulty in walking, not elsewhere classified: Secondary | ICD-10-CM | POA: Diagnosis not present

## 2016-05-06 ENCOUNTER — Ambulatory Visit: Payer: Medicare Other | Admitting: Gastroenterology

## 2016-06-18 ENCOUNTER — Encounter (INDEPENDENT_AMBULATORY_CARE_PROVIDER_SITE_OTHER): Payer: Self-pay

## 2016-06-18 ENCOUNTER — Ambulatory Visit (INDEPENDENT_AMBULATORY_CARE_PROVIDER_SITE_OTHER): Payer: Medicare Other | Admitting: Gastroenterology

## 2016-06-18 ENCOUNTER — Encounter: Payer: Self-pay | Admitting: Gastroenterology

## 2016-06-18 VITALS — BP 134/78 | HR 68 | Ht 62.8 in | Wt 128.0 lb

## 2016-06-18 DIAGNOSIS — K5900 Constipation, unspecified: Secondary | ICD-10-CM | POA: Diagnosis not present

## 2016-06-18 DIAGNOSIS — R1314 Dysphagia, pharyngoesophageal phase: Secondary | ICD-10-CM

## 2016-06-18 DIAGNOSIS — Z86010 Personal history of colonic polyps: Secondary | ICD-10-CM

## 2016-06-18 DIAGNOSIS — K219 Gastro-esophageal reflux disease without esophagitis: Secondary | ICD-10-CM | POA: Diagnosis not present

## 2016-06-18 MED ORDER — NA SULFATE-K SULFATE-MG SULF 17.5-3.13-1.6 GM/177ML PO SOLN: 1.0000 | Freq: Once | ORAL | 0 refills | Status: AC

## 2016-06-18 NOTE — Patient Instructions (Signed)
You have been scheduled for an endoscopy and colonoscopy. Please follow the written instructions given to you at your visit today. Please pick up your prep supplies at the pharmacy within the next 1-3 days. If you use inhalers (even only as needed), please bring them with you on the day of your procedure. Your physician has requested that you go to www.startemmi.com and enter the access code given to you at your visit today. This web site gives a general overview about your procedure. However, you should still follow specific instructions given to you by our office regarding your preparation for the procedure.  Thank you for choosing me and [facility].  Malcolm T. Stark, Jr., MD., FACG  

## 2016-06-18 NOTE — Progress Notes (Signed)
History of Present Illness: This is a 66 year old female returning for follow-up of GERD, dysphagia and chest pain. One component of her chest pain appeared to be musculoskeletal. Her antireflux regimen was intensified increasing omeprazole to 40 mg twice daily 2 months ago. Her symptoms have substantially improved. Tylenol has helped one component of her chest pain and omeprazole appears to of help the other component. She does have occasional mild reflux symptoms and she still notes occasional difficulty swallowing and she localizes the symptoms to her neck. She has ongoing problems with constipation and occasional hemorrhoidal swelling and bleeding. She takes Ducolax every several days to induce a bowel movement.  Allergies  Allergen Reactions  . Amoxicillin Itching and Rash   Outpatient Medications Prior to Visit  Medication Sig Dispense Refill  . baclofen (LIORESAL) 10 MG tablet Take 10 mg by mouth as needed for muscle spasms.    Marland Kitchen ibuprofen (ADVIL,MOTRIN) 800 MG tablet Take 800 mg by mouth every 8 (eight) hours as needed.    Marland Kitchen omeprazole (PRILOSEC) 40 MG capsule Take 1 capsule (40 mg total) by mouth 2 (two) times daily. 60 capsule 11   No facility-administered medications prior to visit.    Past Medical History:  Diagnosis Date  . Acoustic neuroma ([place])   . Adenomatous colon polyp 12/2002  . Arthritis   . Breast cancer ([place])   . Cholelithiases   . Esophageal stricture   . GERD (gastroesophageal reflux disease)   . Hemorrhoids   . Hiatal hernia   . Inguinal hernia   . UTI (lower urinary tract infection)    Past Surgical History:  Procedure Laterality Date  . BREAST LUMPECTOMY    . CESAREAN SECTION     2x  . CHOLECYSTECTOMY  2008  . HEMORRHOID SURGERY  2007  . INGUINAL HERNIA REPAIR  1968  . PARTIAL HYSTERECTOMY  1996  . TONSILLECTOMY     Social History   Social History  . Marital status: Married    Spouse name: N/A  . Number of children: 2  . Years of education:  N/A   Occupational History  . office [employer]   Social History Main Topics  . Smoking status: Never Smoker  . Smokeless tobacco: Never Used  . Alcohol use No  . Drug use: No  . Sexual activity: Not Asked   Other Topics Concern  . None   Social History Narrative  . None   Family History  Problem Relation Age of Onset  . Cancer      ? Grandmother  . Diabetes    . Heart disease Mother       Physical Exam: General: Well developed, well nourished, no acute distress Head: Normocephalic and atraumatic Eyes:  sclerae anicteric, EOMI Ears: Normal auditory acuity Mouth: No deformity or lesions Lungs: Clear throughout to auscultation Heart: Regular rate and rhythm; no murmurs, rubs or bruits Abdomen: Soft, non tender and non distended. No masses, hepatosplenomegaly or hernias noted. Normal Bowel sounds Musculoskeletal: Symmetrical with no gross deformities  Pulses:  Normal pulses noted Extremities: No clubbing, cyanosis, edema or deformities noted Neurological: Alert oriented x 4, grossly nonfocal Psychological:  Alert and cooperative. Normal mood and affect  Assessment and Recommendations:  1. GERD and dysphagia. Much improved however occasional dysphagia persists. Continue omeprazole 40 mg po bid. Schedule EGD with possible dilation. The risks (including bleeding, perforation, infection, missed lesions, medication reactions and possible hospitalization or surgery if complications occur), benefits, and alternatives to endoscopy with possible  biopsy and possible dilation were discussed with the patient and they consent to proceed.   2. Chest pain. Appear to be musculoskeletal and GI related. See #1. Much improved. Continue Tylenol prn.   3. Personal history of adenomatous colon polyps, overdue for surveillance colonoscopy. Schedule colonoscopy. The risks, benefits, and alternatives to colonoscopy with possible biopsy, possible polypectomy were discussed with the patient and  they consent to proceed.   4. Constipation. Miralax bid to tid every day and Ducolax qd prn.

## 2016-07-08 DIAGNOSIS — Z0000 Encounter for general adult medical examination without abnormal findings: Secondary | ICD-10-CM | POA: Diagnosis not present

## 2016-07-08 DIAGNOSIS — M810 Age-related osteoporosis without current pathological fracture: Secondary | ICD-10-CM | POA: Diagnosis not present

## 2016-07-15 DIAGNOSIS — H35039 Hypertensive retinopathy, unspecified eye: Secondary | ICD-10-CM | POA: Diagnosis not present

## 2016-07-15 DIAGNOSIS — G5761 Lesion of plantar nerve, right lower limb: Secondary | ICD-10-CM | POA: Diagnosis not present

## 2016-07-15 DIAGNOSIS — D333 Benign neoplasm of cranial nerves: Secondary | ICD-10-CM | POA: Diagnosis not present

## 2016-07-15 DIAGNOSIS — M810 Age-related osteoporosis without current pathological fracture: Secondary | ICD-10-CM | POA: Diagnosis not present

## 2016-07-15 DIAGNOSIS — C50919 Malignant neoplasm of unspecified site of unspecified female breast: Secondary | ICD-10-CM | POA: Diagnosis not present

## 2016-07-15 DIAGNOSIS — Z0000 Encounter for general adult medical examination without abnormal findings: Secondary | ICD-10-CM | POA: Diagnosis not present

## 2016-07-15 DIAGNOSIS — K219 Gastro-esophageal reflux disease without esophagitis: Secondary | ICD-10-CM | POA: Diagnosis not present

## 2016-07-15 DIAGNOSIS — M1990 Unspecified osteoarthritis, unspecified site: Secondary | ICD-10-CM | POA: Diagnosis not present

## 2016-07-15 DIAGNOSIS — H259 Unspecified age-related cataract: Secondary | ICD-10-CM | POA: Diagnosis not present

## 2016-07-15 DIAGNOSIS — M6740 Ganglion, unspecified site: Secondary | ICD-10-CM | POA: Diagnosis not present

## 2016-07-15 DIAGNOSIS — Z6822 Body mass index (BMI) 22.0-22.9, adult: Secondary | ICD-10-CM | POA: Diagnosis not present

## 2016-07-22 DIAGNOSIS — M810 Age-related osteoporosis without current pathological fracture: Secondary | ICD-10-CM | POA: Diagnosis not present

## 2016-07-22 DIAGNOSIS — M25551 Pain in right hip: Secondary | ICD-10-CM | POA: Diagnosis not present

## 2016-07-22 DIAGNOSIS — M47816 Spondylosis without myelopathy or radiculopathy, lumbar region: Secondary | ICD-10-CM | POA: Diagnosis not present

## 2016-07-29 ENCOUNTER — Ambulatory Visit (AMBULATORY_SURGERY_CENTER): Payer: Medicare Other | Admitting: Gastroenterology

## 2016-07-29 ENCOUNTER — Encounter: Payer: Self-pay | Admitting: Gastroenterology

## 2016-07-29 VITALS — BP 126/89 | HR 63 | Temp 97.3°F | Resp 18 | Ht 62.0 in | Wt 128.0 lb

## 2016-07-29 DIAGNOSIS — K219 Gastro-esophageal reflux disease without esophagitis: Secondary | ICD-10-CM

## 2016-07-29 DIAGNOSIS — K2990 Gastroduodenitis, unspecified, without bleeding: Secondary | ICD-10-CM | POA: Diagnosis not present

## 2016-07-29 DIAGNOSIS — K2970 Gastritis, unspecified, without bleeding: Secondary | ICD-10-CM | POA: Diagnosis not present

## 2016-07-29 DIAGNOSIS — D123 Benign neoplasm of transverse colon: Secondary | ICD-10-CM

## 2016-07-29 DIAGNOSIS — R1310 Dysphagia, unspecified: Secondary | ICD-10-CM | POA: Diagnosis not present

## 2016-07-29 DIAGNOSIS — B999 Unspecified infectious disease: Secondary | ICD-10-CM | POA: Diagnosis not present

## 2016-07-29 DIAGNOSIS — D120 Benign neoplasm of cecum: Secondary | ICD-10-CM | POA: Diagnosis not present

## 2016-07-29 DIAGNOSIS — Z86010 Personal history of colonic polyps: Secondary | ICD-10-CM

## 2016-07-29 DIAGNOSIS — R1319 Other dysphagia: Secondary | ICD-10-CM

## 2016-07-29 MED ORDER — SODIUM CHLORIDE 0.9 % IV SOLN: 500.0000 mL | INTRAVENOUS | Status: DC

## 2016-07-29 NOTE — Op Note (Signed)
[facility] Patient Name: Larose Hollenberg Procedure Date: 07/29/2016 2:53 PM MRN: [id number] Endoscopist: Ladene Artist , MD Age: 66 Referring MD:  Date of Birth: [birth year] Gender: Female Account #: [id number] Procedure:                Colonoscopy Indications:              Surveillance: Personal history of adenomatous                            polyps on last colonoscopy > 5 years ago Medicines:                Monitored Anesthesia Care Procedure:                Pre-Anesthesia Assessment:                           - Prior to the procedure, a History and Physical                            was performed, and patient medications and                            allergies were reviewed. The patient's tolerance of                            previous anesthesia was also reviewed. The risks                            and benefits of the procedure and the sedation                            options and risks were discussed with the patient.                            All questions were answered, and informed consent                            was obtained. Prior Anticoagulants: The patient has                            taken no previous anticoagulant or antiplatelet                            agents. ASA Grade Assessment: II - A patient with                            mild systemic disease. After reviewing the risks                            and benefits, the patient was deemed in                            satisfactory condition to undergo the procedure.  After obtaining informed consent, the colonoscope                            was passed under direct vision. Throughout the                            procedure, the patient's blood pressure, pulse, and                            oxygen saturations were monitored continuously. The                            Model PCF-[id number]) scope was introduced                            through the anus and  advanced to the the cecum,                            identified by appendiceal orifice and ileocecal                            valve. The ileocecal valve, appendiceal orifice,                            and rectum were photographed. The quality of the                            bowel preparation was good after extensive lavage                            and suctioning. The colonoscopy was performed                            without difficulty. Unable to retroflex in the                            rectum due to a narrow rectal vault.The patient                            tolerated the procedure well. Scope In: 3:01:13 PM Scope Out: 3:16:08 PM Scope Withdrawal Time: 0 hours 11 minutes 8 seconds  Total Procedure Duration: 0 hours 14 minutes 55 seconds  Findings:                 The perianal and digital rectal examinations were                            normal.                           A 7 mm polyp was found in the transverse colon. The                            polyp was sessile. The polyp was removed with a  cold snare. Resection and retrieval were complete.                           Two sessile polyps were found in the hepatic                            flexure and cecum. The polyps were 5 mm in size.                            These polyps were removed with a cold biopsy                            forceps. Resection and retrieval were complete.                           Internal hemorrhoids were found. The hemorrhoids                            were medium-sized and Grade I (internal hemorrhoids                            that do not prolapse). Unable to retroflex due to a                            narrow rectal vault. Surgical changes and small                            suture tips in distal rectum.                           The exam was otherwise without abnormality on                            direct and retroflexion views. Complications:             No immediate complications. Estimated blood loss:                            None. Estimated Blood Loss:     Estimated blood loss: none. Impression:               - One 7 mm polyp in the transverse colon, removed                            with a cold snare. Resected and retrieved.                           - Two 5 mm polyps at the hepatic flexure and in the                            cecum, removed with a cold biopsy forceps. Resected                            and retrieved.                           -  Surgical changes and suture tips noted in the                            distal rectum                           - Internal hemorrhoids.                           - The examination was otherwise normal on direct                            and retroflexion views. Recommendation:           - Repeat colonoscopy in 5 years for surveillance.                           - Patient has a contact number available for                            emergencies. The signs and symptoms of potential                            delayed complications were discussed with the                            patient. Return to normal activities tomorrow.                            Written discharge instructions were provided to the                            patient.                           - Resume previous diet.                           - Continue present medications.                           - Await pathology results. Ladene Artist, MD 07/29/2016 3:28:32 PM This report has been signed electronically.

## 2016-07-29 NOTE — Patient Instructions (Signed)
Discharge instructions given. Handouts on polyps,hemorrhoids,gastritis, and a high fiber diet. Dilatation diet given. Resume previous medications. YOU HAD AN ENDOSCOPIC PROCEDURE TODAY AT [facility]:   Refer to the procedure report that was given to you for any specific questions about what was found during the examination.  If the procedure report does not answer your questions, please call your gastroenterologist to clarify.  If you requested that your care partner not be given the details of your procedure findings, then the procedure report has been included in a sealed envelope for you to review at your convenience later.  YOU SHOULD EXPECT: Some feelings of bloating in the abdomen. Passage of more gas than usual.  Walking can help get rid of the air that was put into your GI tract during the procedure and reduce the bloating. If you had a lower endoscopy (such as a colonoscopy or flexible sigmoidoscopy) you may notice spotting of blood in your stool or on the toilet paper. If you underwent a bowel prep for your procedure, you may not have a normal bowel movement for a few days.  Please Note:  You might notice some irritation and congestion in your nose or some drainage.  This is from the oxygen used during your procedure.  There is no need for concern and it should clear up in a day or so.  SYMPTOMS TO REPORT IMMEDIATELY:   Following lower endoscopy (colonoscopy or flexible sigmoidoscopy):  Excessive amounts of blood in the stool  Significant tenderness or worsening of abdominal pains  Swelling of the abdomen that is new, acute  Fever of 100F or higher   Following upper endoscopy (EGD)  Vomiting of blood or coffee ground material  New chest pain or pain under the shoulder blades  Painful or persistently difficult swallowing  New shortness of breath  Fever of 100F or higher  Black, tarry-looking stools  For urgent or emergent issues, a gastroenterologist can be  reached at any hour by calling [phone].   DIET:  We do recommend a small meal at first, but then you may proceed to your regular diet.  Drink plenty of fluids but you should avoid alcoholic beverages for 24 hours.  ACTIVITY:  You should plan to take it easy for the rest of today and you should NOT DRIVE or use heavy machinery until tomorrow (because of the sedation medicines used during the test).    FOLLOW UP: Our staff will call the number listed on your records the next business day following your procedure to check on you and address any questions or concerns that you may have regarding the information given to you following your procedure. If we do not reach you, we will leave a message.  However, if you are feeling well and you are not experiencing any problems, there is no need to return our call.  We will assume that you have returned to your regular daily activities without incident.  If any biopsies were taken you will be contacted by phone or by letter within the next 1-3 weeks.  Please call us at [phone] if you have not heard about the biopsies in 3 weeks.    SIGNATURES/CONFIDENTIALITY: You and/or your care partner have signed paperwork which will be entered into your electronic medical record.  These signatures attest to the fact that that the information above on your After Visit Summary has been reviewed and is understood.  Full responsibility of the confidentiality of this discharge information lies  with you and/or your care-partner.  

## 2016-07-29 NOTE — Progress Notes (Signed)
To recovery, report to McCoy, RN, VSS 

## 2016-07-29 NOTE — Progress Notes (Signed)
Called to room to assist during endoscopic procedure.  Patient ID and intended procedure confirmed with present staff. Received instructions for my participation in the procedure from the performing physician.  

## 2016-07-29 NOTE — Op Note (Signed)
[facility] Patient Name: Stephanie Bautista Procedure Date: 07/29/2016 2:52 PM MRN: [id number] Endoscopist: Ladene Artist , MD Age: 66 Referring MD:  Date of Birth: February 22, 1951 Gender: Female Account #: [id number] Procedure:                Upper GI endoscopy Indications:              Dysphagia, Gastroesophageal reflux disease Medicines:                Monitored Anesthesia Care Procedure:                Pre-Anesthesia Assessment:                           - Prior to the procedure, a History and Physical                            was performed, and patient medications and                            allergies were reviewed. The patient's tolerance of                            previous anesthesia was also reviewed. The risks                            and benefits of the procedure and the sedation                            options and risks were discussed with the patient.                            All questions were answered, and informed consent                            was obtained. Prior Anticoagulants: The patient has                            taken no previous anticoagulant or antiplatelet                            agents. ASA Grade Assessment: II - A patient with                            mild systemic disease. After reviewing the risks                            and benefits, the patient was deemed in                            satisfactory condition to undergo the procedure.                           After obtaining informed consent, the endoscope was  passed under direct vision. Throughout the                            procedure, the patient's blood pressure, pulse, and                            oxygen saturations were monitored continuously. The                            Endoscope was introduced through the mouth, and                            advanced to the second part of duodenum. The upper                            GI endoscopy  was accomplished without difficulty.                            The patient tolerated the procedure well. Scope In: Scope Out: Findings:                 No endoscopic abnormality was evident in the                            esophagus to explain the patient's complaint of                            dysphagia. It was decided to proceed with dilation                            of the entire esophagus given her persistent                            dysphagia symptoms. A guidewire was placed and the                            scope was withdrawn. Dilation was performed with a                            Savary dilator with no resistance at 15 mm. No heme.                           Diffuse mildly erythematous mucosa without bleeding                            was found in the gastric body and in the gastric                            antrum. Biopsies were taken with a cold forceps for                            histology.  The exam of the stomach was otherwise normal.                           The duodenal bulb and second portion of the                            duodenum were normal. Complications:            No immediate complications. Estimated Blood Loss:     Estimated blood loss was minimal. Impression:               - No endoscopic esophageal abnormality to explain                            patient's dysphagia. Esophagus dilated. Dilated.                           - Erythematous mucosa in the gastric body and                            antrum. Biopsied.                           - Normal duodenal bulb and second portion of the                            duodenum. Recommendation:           - Patient has a contact number available for                            emergencies. The signs and symptoms of potential                            delayed complications were discussed with the                            patient. Return to normal activities tomorrow.                             Written discharge instructions were provided to the                            patient.                           - Continue present medications.                           - Await pathology results.                           - Clear liquid diet for 2 hours, then advance as                            tolerated to soft diet today. Resume prior diet  tomorrow. Ladene Artist, MD 07/29/2016 3:32:26 PM This report has been signed electronically.

## 2016-07-30 ENCOUNTER — Telehealth: Payer: Self-pay | Admitting: *Deleted

## 2016-07-30 NOTE — Telephone Encounter (Signed)
  Follow up Call-  Call back number 07/29/2016  Post procedure Call Back phone  # [phone]  Permission to leave phone message Yes  Some recent data might be hidden     No answer at # given.  2nd attempt to reach patient.  LM on VM.

## 2016-07-30 NOTE — Telephone Encounter (Signed)
  Follow up Call-  Call back number 07/29/2016  Post procedure Call Back phone  # [phone]  Permission to leave phone message Yes  Some recent data might be hidden     Left message to call us if necessary.

## 2016-08-03 DIAGNOSIS — M545 Low back pain: Secondary | ICD-10-CM | POA: Diagnosis not present

## 2016-08-03 DIAGNOSIS — M47816 Spondylosis without myelopathy or radiculopathy, lumbar region: Secondary | ICD-10-CM | POA: Diagnosis not present

## 2016-08-11 DIAGNOSIS — M816 Localized osteoporosis [Lequesne]: Secondary | ICD-10-CM | POA: Diagnosis not present

## 2016-08-11 DIAGNOSIS — N958 Other specified menopausal and perimenopausal disorders: Secondary | ICD-10-CM | POA: Diagnosis not present

## 2016-08-12 ENCOUNTER — Telehealth: Payer: Self-pay | Admitting: Gastroenterology

## 2016-08-12 ENCOUNTER — Other Ambulatory Visit: Payer: Self-pay

## 2016-08-12 MED ORDER — BIS SUBCIT-METRONID-TETRACYC 140-125-125 MG PO CAPS
3.0000 | ORAL_CAPSULE | Freq: Three times a day (TID) | ORAL | 0 refills | Status: DC
Start: 2016-08-12 — End: 2017-07-27

## 2016-08-13 NOTE — Telephone Encounter (Signed)
Informed patient that we have samples she can pick up at our front desk. Pt informed to take samples 3 caps four times a day along with her PPI twice daily. Patient verbalized understanding.

## 2016-09-01 ENCOUNTER — Telehealth: Payer: Self-pay | Admitting: Gastroenterology

## 2016-09-01 MED ORDER — FLUCONAZOLE 150 MG PO TABS: 150.0000 mg | ORAL_TABLET | Freq: Every day | ORAL | 1 refills | Status: DC

## 2016-09-01 NOTE — Telephone Encounter (Signed)
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 Patient reports that she has a vaginal yeast infection from the Pylera.  She is requesting a prescription   Dr. Carlean Purl you are MD of the day.  Please advise

## 2016-09-01 NOTE — Telephone Encounter (Signed)
Fluconazole 150 mg # 1 RF x 1

## 2016-09-01 NOTE — Telephone Encounter (Signed)
Patient notified of the recommendations RX sent. 

## 2016-09-10 DIAGNOSIS — M810 Age-related osteoporosis without current pathological fracture: Secondary | ICD-10-CM | POA: Diagnosis not present

## 2016-09-10 DIAGNOSIS — M47816 Spondylosis without myelopathy or radiculopathy, lumbar region: Secondary | ICD-10-CM | POA: Diagnosis not present

## 2016-09-24 DIAGNOSIS — M47816 Spondylosis without myelopathy or radiculopathy, lumbar region: Secondary | ICD-10-CM | POA: Diagnosis not present

## 2016-10-01 DIAGNOSIS — M47816 Spondylosis without myelopathy or radiculopathy, lumbar region: Secondary | ICD-10-CM | POA: Diagnosis not present

## 2016-10-01 DIAGNOSIS — M5116 Intervertebral disc disorders with radiculopathy, lumbar region: Secondary | ICD-10-CM | POA: Diagnosis not present

## 2016-10-06 DIAGNOSIS — M545 Low back pain: Secondary | ICD-10-CM | POA: Diagnosis not present

## 2016-10-14 DIAGNOSIS — M545 Low back pain: Secondary | ICD-10-CM | POA: Diagnosis not present

## 2016-10-21 DIAGNOSIS — M810 Age-related osteoporosis without current pathological fracture: Secondary | ICD-10-CM | POA: Diagnosis not present

## 2016-10-21 DIAGNOSIS — M47816 Spondylosis without myelopathy or radiculopathy, lumbar region: Secondary | ICD-10-CM | POA: Diagnosis not present

## 2016-10-22 DIAGNOSIS — M545 Low back pain: Secondary | ICD-10-CM | POA: Diagnosis not present

## 2016-10-29 DIAGNOSIS — M545 Low back pain: Secondary | ICD-10-CM | POA: Diagnosis not present

## 2016-11-05 DIAGNOSIS — M545 Low back pain: Secondary | ICD-10-CM | POA: Diagnosis not present

## 2016-11-09 DIAGNOSIS — M545 Low back pain: Secondary | ICD-10-CM | POA: Diagnosis not present

## 2016-11-12 DIAGNOSIS — M545 Low back pain: Secondary | ICD-10-CM | POA: Diagnosis not present

## 2016-11-16 DIAGNOSIS — M545 Low back pain: Secondary | ICD-10-CM | POA: Diagnosis not present

## 2016-11-18 DIAGNOSIS — M545 Low back pain: Secondary | ICD-10-CM | POA: Diagnosis not present

## 2016-11-30 DIAGNOSIS — M545 Low back pain: Secondary | ICD-10-CM | POA: Diagnosis not present

## 2016-12-02 DIAGNOSIS — M545 Low back pain: Secondary | ICD-10-CM | POA: Diagnosis not present

## 2016-12-09 DIAGNOSIS — M545 Low back pain: Secondary | ICD-10-CM | POA: Diagnosis not present

## 2016-12-15 DIAGNOSIS — M545 Low back pain: Secondary | ICD-10-CM | POA: Diagnosis not present

## 2016-12-17 DIAGNOSIS — M545 Low back pain: Secondary | ICD-10-CM | POA: Diagnosis not present

## 2016-12-21 DIAGNOSIS — M545 Low back pain: Secondary | ICD-10-CM | POA: Diagnosis not present

## 2016-12-24 DIAGNOSIS — N390 Urinary tract infection, site not specified: Secondary | ICD-10-CM | POA: Diagnosis not present

## 2016-12-24 DIAGNOSIS — R8299 Other abnormal findings in urine: Secondary | ICD-10-CM | POA: Diagnosis not present

## 2016-12-28 DIAGNOSIS — M545 Low back pain: Secondary | ICD-10-CM | POA: Diagnosis not present

## 2017-01-04 DIAGNOSIS — M545 Low back pain: Secondary | ICD-10-CM | POA: Diagnosis not present

## 2017-01-07 DIAGNOSIS — Z01419 Encounter for gynecological examination (general) (routine) without abnormal findings: Secondary | ICD-10-CM | POA: Diagnosis not present

## 2017-01-07 DIAGNOSIS — N760 Acute vaginitis: Secondary | ICD-10-CM | POA: Diagnosis not present

## 2017-01-07 DIAGNOSIS — Z6824 Body mass index (BMI) 24.0-24.9, adult: Secondary | ICD-10-CM | POA: Diagnosis not present

## 2017-01-07 DIAGNOSIS — R351 Nocturia: Secondary | ICD-10-CM | POA: Diagnosis not present

## 2017-03-09 DIAGNOSIS — D485 Neoplasm of uncertain behavior of skin: Secondary | ICD-10-CM | POA: Diagnosis not present

## 2017-03-09 DIAGNOSIS — L821 Other seborrheic keratosis: Secondary | ICD-10-CM | POA: Diagnosis not present

## 2017-03-09 DIAGNOSIS — D2221 Melanocytic nevi of right ear and external auricular canal: Secondary | ICD-10-CM | POA: Diagnosis not present

## 2017-03-09 DIAGNOSIS — Z86018 Personal history of other benign neoplasm: Secondary | ICD-10-CM | POA: Diagnosis not present

## 2017-03-09 DIAGNOSIS — L720 Epidermal cyst: Secondary | ICD-10-CM | POA: Diagnosis not present

## 2017-03-09 DIAGNOSIS — L570 Actinic keratosis: Secondary | ICD-10-CM | POA: Diagnosis not present

## 2017-03-09 DIAGNOSIS — D2261 Melanocytic nevi of right upper limb, including shoulder: Secondary | ICD-10-CM | POA: Diagnosis not present

## 2017-03-09 DIAGNOSIS — D224 Melanocytic nevi of scalp and neck: Secondary | ICD-10-CM | POA: Diagnosis not present

## 2017-03-09 DIAGNOSIS — Z23 Encounter for immunization: Secondary | ICD-10-CM | POA: Diagnosis not present

## 2017-03-09 DIAGNOSIS — D225 Melanocytic nevi of trunk: Secondary | ICD-10-CM | POA: Diagnosis not present

## 2017-03-09 DIAGNOSIS — Z808 Family history of malignant neoplasm of other organs or systems: Secondary | ICD-10-CM | POA: Diagnosis not present

## 2017-03-09 DIAGNOSIS — L853 Xerosis cutis: Secondary | ICD-10-CM | POA: Diagnosis not present

## 2017-03-10 DIAGNOSIS — M810 Age-related osteoporosis without current pathological fracture: Secondary | ICD-10-CM | POA: Diagnosis not present

## 2017-03-10 DIAGNOSIS — K219 Gastro-esophageal reflux disease without esophagitis: Secondary | ICD-10-CM | POA: Diagnosis not present

## 2017-03-10 DIAGNOSIS — E236 Other disorders of pituitary gland: Secondary | ICD-10-CM | POA: Diagnosis not present

## 2017-03-10 DIAGNOSIS — Z87442 Personal history of urinary calculi: Secondary | ICD-10-CM | POA: Diagnosis not present

## 2017-03-10 DIAGNOSIS — K5900 Constipation, unspecified: Secondary | ICD-10-CM | POA: Diagnosis not present

## 2017-03-10 DIAGNOSIS — Z923 Personal history of irradiation: Secondary | ICD-10-CM | POA: Diagnosis not present

## 2017-03-10 DIAGNOSIS — Z853 Personal history of malignant neoplasm of breast: Secondary | ICD-10-CM | POA: Diagnosis not present

## 2017-03-10 DIAGNOSIS — Z8349 Family history of other endocrine, nutritional and metabolic diseases: Secondary | ICD-10-CM | POA: Diagnosis not present

## 2017-03-17 DIAGNOSIS — Z87442 Personal history of urinary calculi: Secondary | ICD-10-CM | POA: Diagnosis not present

## 2017-03-17 DIAGNOSIS — M810 Age-related osteoporosis without current pathological fracture: Secondary | ICD-10-CM | POA: Diagnosis not present

## 2017-03-17 DIAGNOSIS — K5900 Constipation, unspecified: Secondary | ICD-10-CM | POA: Diagnosis not present

## 2017-03-17 DIAGNOSIS — E236 Other disorders of pituitary gland: Secondary | ICD-10-CM | POA: Diagnosis not present

## 2017-03-17 DIAGNOSIS — Z8349 Family history of other endocrine, nutritional and metabolic diseases: Secondary | ICD-10-CM | POA: Diagnosis not present

## 2017-03-22 ENCOUNTER — Ambulatory Visit: Payer: Medicare Other | Attending: Internal Medicine | Admitting: Physical Therapy

## 2017-03-22 ENCOUNTER — Encounter: Payer: Self-pay | Admitting: Physical Therapy

## 2017-03-22 DIAGNOSIS — M6281 Muscle weakness (generalized): Secondary | ICD-10-CM | POA: Insufficient documentation

## 2017-03-22 DIAGNOSIS — M545 Low back pain: Secondary | ICD-10-CM | POA: Diagnosis not present

## 2017-03-22 DIAGNOSIS — G8929 Other chronic pain: Secondary | ICD-10-CM | POA: Diagnosis not present

## 2017-03-22 DIAGNOSIS — R2681 Unsteadiness on feet: Secondary | ICD-10-CM | POA: Diagnosis not present

## 2017-03-22 NOTE — Therapy (Signed)
[facility] [address], [place], [place], [address] Phone: [phone]   Fax:  [phone]  Physical Therapy Treatment  Patient Details  Name: Stephanie Bautista MRN: [id number] Date of Birth: [birth year] Referring Provider: Delrae Rend, MD   Encounter Date: 03/22/2017      PT End of Session - 03/22/17 1325    Visit Number 1   Number of Visits 12   Date for PT Re-Evaluation 05/03/17   Authorization Type Medicare A and B    Authorization Time Period 03/22/17 to 05/05/17   Authorization - Visit Number 1   Authorization - Number of Visits 10   PT Start Time 0845   PT Stop Time 0930   PT Time Calculation (min) 45 min   Activity Tolerance Patient tolerated treatment well;No increased pain   Behavior During Therapy WFL for tasks assessed/performed      Past Medical History:  Diagnosis Date  . Acoustic neuroma ([place])   . Adenomatous colon polyp 12/2002  . Arthritis   . Breast cancer ([place])   . Cholelithiases   . Esophageal stricture   . GERD (gastroesophageal reflux disease)   . Hemorrhoids   . Hiatal hernia   . Inguinal hernia   . UTI (lower urinary tract infection)     Past Surgical History:  Procedure Laterality Date  . BREAST LUMPECTOMY    . CESAREAN SECTION     2x  . CHOLECYSTECTOMY  2008  . HEMORRHOID SURGERY  2007  . INGUINAL HERNIA REPAIR  1968  . PARTIAL HYSTERECTOMY  1996  . TONSILLECTOMY      There were no vitals filed for this visit.      Subjective Assessment - 03/22/17 0850    Subjective Pt reports that she was recently diagnosed with osteoporosis. She has received PT in the past year or so for her back. She is trying to figure out what treatment she will receive considering her history of breast cancer and back pain.    Pertinent History breast cancer   Patient Stated Goals strengthen bones and muscles and prevent risk of falling    Currently in Pain? No/denies            [facility]  PT Assessment - 03/22/17 0001      Assessment   Medical Diagnosis Osteoporosis   Referring Provider Delrae Rend, MD    Prior Therapy PT for LBP      Precautions   Precautions Other (comment)   Precaution Comments Osteoporosis      Balance Screen   Has the patient fallen in the past 6 months No   Has the patient had a decrease in activity level because of a fear of falling?  No   Is the patient reluctant to leave their home because of a fear of falling?  No     Home Ecologist residence   Additional Comments 4 STE     Prior Function   Level of Independence Independent     Cognition   Overall Cognitive Status Within Functional Limits for tasks assessed     Functional Tests   Functional tests Single leg stance     Single Leg Stance   Comments Rt: 5 sec (+) trendelenburg, Lt: 12 sec      Posture/Postural Control   Posture Comments slouched sitting (unsupported), forward head, rounded shoulders      ROM / Strength   AROM / PROM / Strength Strength  Strength   Strength Assessment Site Hip;Knee;Ankle   Right/Left Hip Right;Left   Right Hip Flexion 4-/5   Right Hip Extension 4-/5   Right Hip ABduction 3+/5  (+) pain    Left Hip Flexion 5/5   Left Hip Extension 4/5   Left Hip ABduction 3+/5   Right/Left Knee Right;Left   Right Knee Flexion 3+/5   Right Knee Extension 4+/5   Left Knee Flexion 4+/5   Left Knee Extension 5/5   Right/Left Ankle Right;Left   Right Ankle Dorsiflexion 5/5   Left Ankle Dorsiflexion 5/5     Flexibility   Soft Tissue Assessment /Muscle Length --  hamstring and quads WNL      Palpation   Palpation comment tenderness over greater trochanter      Transfers   Five time sit to stand comments  11.2 sec, (+) knee valgus     Standardized Balance Assessment   Standardized Balance Assessment Timed Up and Go Test     Timed Up and Go Test   TUG Comments 8.2 sec, no AD      High Level Balance   High Level  Balance Comments tandem: 15 sec each LE forward                      [facility] Adult PT Treatment/Exercise - 03/22/17 0001      Exercises   Exercises Knee/Hip     Knee/Hip Exercises: Supine   Bridges Both;1 set;10 reps   Bridges Limitations HEP demo, ankle DF    Other Supine Knee/Hip Exercises single leg clam with red TB x15 reps each, abdominal bracing cues provided                 PT Education - 03/22/17 0957    Education provided Yes   Education Details Do's/Dont's of osteoporosis; implemented and reviewed HEP; eval findings/POC   Person(s) Educated Patient   Methods Explanation;Handout;Verbal cues   Comprehension Verbalized understanding;Returned demonstration          PT Short Term Goals - 03/22/17 1329      PT SHORT TERM GOAL #1   Title Pt will demo consistency and independence with her HEP.    Time 3   Period Weeks   Status New   Target Date 04/12/17     PT SHORT TERM GOAL #2   Title Pt will demo good understanding of recommended osteoporosis activity modifications evident by her ability to recall atleast 4 key points from handout provided at evaluation.    Time 3   Period Weeks   Status New     PT SHORT TERM GOAL #3   Title Pt will demo proper set up and use of a lumbar roll while sitting in order to decrease pain and improve her posture at work.    Time 3   Period Weeks   Status New           PT Long Term Goals - 03/22/17 1331      PT LONG TERM GOAL #1   Title Pt will demo improved BLE strength to 5/5 MMT which will improve her safety and efficiency with tasks at home and work.    Time 6   Period Weeks   Status New     PT LONG TERM GOAL #2   Title Pt will maintain single leg balance on each LE for atleast 15 sec without LOB or significant trendlenburg deviation, 2/3 tirals, which will decrease her risk of  falls and injury.    Time 6   Period Weeks   Status New     PT LONG TERM GOAL #3   Title Pt will demo proper lifting  mechanics, evident by her ability to lift a 10lb object from the floor without excessive trunk flexion or the need for therapist cuing, which will decrease her risk of injury throughout the day.    Time 6   Period Weeks   Status New               Plan - 2017-04-11 0926    Clinical Impression Statement Pt is a pleasant 66 y.o F referred to OPPT with a recent diagnosis of osteoporosis. She also has had chronic low back pain issues which she had been managing with therapy. She presents today demonstrating limitations in LE strength (Rt>Lt), in addition to trunk weakness, impaired balance and poor understanding of proper mechanics and lifestyle adjustments necessary to manage her new diagnosis. She has natural forward flexed posturing and would like to get into a regular exercise routine to allow her to manage her fitness, strength and stability on her own. She would benefit from ksilled PT to provide proper education regarding her diagnosis, increase her strength, balance and mechanics with activity.    History and Personal Factors relevant to plan of care: natural progression of condition, history of low back pain    Clinical Presentation Stable   Clinical Decision Making Low   Rehab Potential Good   PT Frequency 2x / week   PT Duration 6 weeks   PT Treatment/Interventions ADLs/Self Care Home Management;Balance training;Therapeutic exercise;Therapeutic activities;Functional mobility training;Neuromuscular re-education;Patient/family education;Passive range of motion;Manual techniques;Dry needling   PT Next Visit Plan provide and review osteoporosis decompression exercises; show set up and use of towel roll for improved sitting tolerance at work, progress LE strength and balance    PT Home Exercise Plan bridge, supine clams with green TB, tandem balance, do/dont of osteoporosis    Consulted and Agree with Plan of Care Patient      Patient will benefit from skilled therapeutic intervention in  order to improve the following deficits and impairments:  Decreased balance, Decreased activity tolerance, Improper body mechanics, Pain, Postural dysfunction, Decreased strength, Decreased safety awareness  Visit Diagnosis: Muscle weakness (generalized)  Unsteadiness on feet  Chronic low back pain, unspecified back pain laterality, with sciatica presence unspecified       G-Codes - 04-11-17 1030    Functional Assessment Tool Used (Outpatient Only) Clinical judgement based on assessment of strength, mobility and balance    Functional Limitation Other PT primary   Other PT Primary Current Status (E3329) At least 20 percent but less than 40 percent impaired, limited or restricted   Other PT Primary Goal Status (J1884) At least 20 percent but less than 40 percent impaired, limited or restricted      Problem List Patient Active Problem List   Diagnosis Date Noted  . Tinnitus of left ear 08/12/2011  . Nailbed injury 08/12/2011  . Sensorineural hearing loss, unilateral 08/12/2011  . ADENOCARCINOMA, BREAST 10/07/2007  . ADENOMATOUS COLONIC POLYP 10/07/2007  . HEMORRHOIDS 10/07/2007  . GERD 10/07/2007  . GASTRITIS 10/07/2007  . INGUINAL HERNIA 10/07/2007  . CHOLELITHIASIS 10/07/2007  . ARTHRITIS 10/07/2007     1:46 PM,2017-04-11 Elly Modena PT, DPT [facility] [address], [place], [place], [address] Phone: [phone]   Fax:  [phone]  Name: Kamalani B Bonito MRN: [id number] Date of Birth: 1950-11-08

## 2017-03-22 NOTE — Patient Instructions (Addendum)
DO's and DON'T's   Avoid and/or Minimize positions of forward bending ( flexion)  Side bending and rotation of the trunk  Especially when movements occur together   When your back aches:   Don't sit down   Lie down on your back with a small pillow under your head and one under your knees or as outlined by our therapist. Or, lie in the 90/90 position ( on the floor with your feet and legs on the sofa with knees and hips bent to 90 degrees)  Tying or putting on your shoes:   Don't bend over to tie your shoes or put on socks.  Instead, bring one foot up, cross it over the opposite knee and bend forward (hinge) at the hips to so the task.  Keep your back straight.  If you cannot do this safely, then you need to use long handled assistive devices such as a shoehorn and sock puller.  Exercising:  Don't engage in ballistic types of exercise routines such as high-impact aerobics or jumping rope  Don't do exercises in the gym that bring you forward (abdominal crunches, sit-ups, touching your  toes, knee-to-chest, straight leg raising.)  Follow a regular exercise program that includes a variety of different weight-bearing activities, such as low-impact aerobics, T' ai chi or walking as your physical therapist advises  Do exercises that emphasize return to normal body alignment and strengthening of the muscles that keep your back straight, as outlined in this program or by your therapist  Household tasks:  Don't reach unnecessarily or twist your trunk when mopping, sweeping, vacuuming, raking, making beds, weeding gardens, getting objects ou of cupboards, etc.  Keep your broom, mop, vacuum, or rake close to you and mover your whole body as you move them. Walk over to the area on which you are working. Arrange kitchen, bathroom, and bedroom shelves so that frequently used items may be reached without excessive bending, twisting, and reaching.  Use a  sturdy stool if necessary.  Don't bend from the waist to pick up something up  Off the floor, out of the trunk of your car, or to brush your teeth, wash your face, etc.   Bend at the knees, keeping back straight as possible. Use a reacher if necessary.   Prevention of fracture is the so-called "BOTTOm -Line" in the management of OSTEOPOROSIS. Do not take unnecessary chances in movement. Once a compression fracture occurs, the process is very difficult to control; one fracture is frequently followed by many more.      Tandem Stance  "In a corner, practice standing "heel to toe" with EYES OPEN.  (One foot in front of the other with the heel of one foot touching the toe of the other foot) The goal is to stand for the entire time without touching the wall. If this is too hard at first, try standing "almost heel to toe" (with feet touching at big toes to the inside of your ankle)."  2x30 sec each    SUPINE HIP ABDUCTION - ELASTIC BAND CLAMS - CLAMSHELL  Lie down on your back with your knees bent. Place an elastic band around your knees and then draw your knees apart. Green band, 2x15 reps    BRIDGING ELASTIC BAND ABDUCTION  While lying on your back, place an elastic band around your knees and pull your knees apart. Hold this and then tighten your lower abdominals, squeeze your buttocks and raise your buttocks off the floor/bed as creating a "Bridge" with  your body. 3x10 reps    [facility] [address] [place], [place] [address] Phone # [phone] Fax [phone]

## 2017-03-31 ENCOUNTER — Encounter: Payer: Medicare Other | Admitting: Physical Therapy

## 2017-04-02 ENCOUNTER — Ambulatory Visit: Payer: Medicare Other | Attending: Internal Medicine | Admitting: Physical Therapy

## 2017-04-02 DIAGNOSIS — R2681 Unsteadiness on feet: Secondary | ICD-10-CM | POA: Diagnosis not present

## 2017-04-02 DIAGNOSIS — M6281 Muscle weakness (generalized): Secondary | ICD-10-CM | POA: Insufficient documentation

## 2017-04-02 DIAGNOSIS — Z923 Personal history of irradiation: Secondary | ICD-10-CM | POA: Diagnosis not present

## 2017-04-02 DIAGNOSIS — M545 Low back pain: Secondary | ICD-10-CM | POA: Diagnosis not present

## 2017-04-02 DIAGNOSIS — K219 Gastro-esophageal reflux disease without esophagitis: Secondary | ICD-10-CM | POA: Diagnosis not present

## 2017-04-02 DIAGNOSIS — Z853 Personal history of malignant neoplasm of breast: Secondary | ICD-10-CM | POA: Diagnosis not present

## 2017-04-02 DIAGNOSIS — G8929 Other chronic pain: Secondary | ICD-10-CM | POA: Insufficient documentation

## 2017-04-02 DIAGNOSIS — M810 Age-related osteoporosis without current pathological fracture: Secondary | ICD-10-CM | POA: Diagnosis not present

## 2017-04-02 DIAGNOSIS — E236 Other disorders of pituitary gland: Secondary | ICD-10-CM | POA: Diagnosis not present

## 2017-04-02 DIAGNOSIS — Z8349 Family history of other endocrine, nutritional and metabolic diseases: Secondary | ICD-10-CM | POA: Diagnosis not present

## 2017-04-02 DIAGNOSIS — Z87442 Personal history of urinary calculi: Secondary | ICD-10-CM | POA: Diagnosis not present

## 2017-04-02 NOTE — Patient Instructions (Addendum)
  Seated Posture with Lumbar Roll  Place the lumbar roll as shown in the picture on the left.  Slide your bottom to the back of the chair and sit up straight so the roll provides support in the natural curve of your lower back.  Keep your shoulders back and head in a neutral position.  Maintain this position at all time when sitting.       ELASTIC BAND - HAMSTRING CURL  While seated and an elastic band attched to your ankle, bend your knee and draw back your foot.  2x15 reps each     GLUTE ROCKING WITH BALL - SELF MASSAGE   Lie on your back and place a small ball, such as a tennis ball or golf ball under your buttock.  Next, rock your pelvis from side-to-side over the ball for a deep tissue massage.   up to 5 min a day.    [facility] [address] [place], [place] [address] Phone # [phone] Fax [phone]

## 2017-04-02 NOTE — Therapy (Signed)
[facility] [address], [place], [place], [address] Phone: [phone]   Fax:  [phone]  Physical Therapy Treatment  Patient Details  Name: Stephanie Bautista MRN: [id number] Date of Birth: 08-16-1950 Referring Provider: Delrae Rend, MD    Encounter Date: 04/02/2017  PT End of Session - 04/02/17 0842    Visit Number  1    Number of Visits  12    Date for PT Re-Evaluation  05/03/17    Authorization Type  Medicare A and B     Authorization Time Period  03/22/17 to 05/05/17    Authorization - Visit Number  2    Authorization - Number of Visits  10    PT Start Time  3875    PT Stop Time  0842    PT Time Calculation (min)  44 min    Activity Tolerance  Patient tolerated treatment well;No increased pain    Behavior During Therapy  WFL for tasks assessed/performed       Past Medical History:  Diagnosis Date  . Acoustic neuroma ([place])   . Adenomatous colon polyp 12/2002  . Arthritis   . Breast cancer ([place])   . Cholelithiases   . Esophageal stricture   . GERD (gastroesophageal reflux disease)   . Hemorrhoids   . Hiatal hernia   . Inguinal hernia   . UTI (lower urinary tract infection)     Past Surgical History:  Procedure Laterality Date  . BREAST LUMPECTOMY    . CESAREAN SECTION     2x  . CHOLECYSTECTOMY  2008  . HEMORRHOID SURGERY  2007  . INGUINAL HERNIA REPAIR  1968  . PARTIAL HYSTERECTOMY  1996  . TONSILLECTOMY      There were no vitals filed for this visit.  Subjective Assessment - 04/02/17 0759    Subjective  Pt reports that she had to cancel her last session because she strained her back while doing dishes one day. She does note this has improved alot.     Pertinent History  breast cancer    Patient Stated Goals  strengthen bones and muscles and prevent risk of falling     Currently in Pain?  No/denies                      [facility] Adult PT Treatment/Exercise - 04/02/17 0001       Self-Care   Self-Care  Posture      Knee/Hip Exercises: Stretches   Other Knee/Hip Stretches  SKTC 5x10 sec each       Knee/Hip Exercises: Standing   Other Standing Knee Exercises  hip extension 2x10 reps each       Knee/Hip Exercises: Seated   Hamstring Curl  1 set;15 reps;Both    Hamstring Limitations  double green TB     Sit to Sand  10 reps;2 sets;without UE support;Other (comment) green TB around knees       Knee/Hip Exercises: Sidelying   Clams  Rt side x15 reps without resistance; Lt with red TB x15 reps       Manual Therapy   Manual Therapy  Soft tissue mobilization    Soft tissue mobilization  IASTM along Rt vastus lateralis, STM Rt glute med/max             PT Education - 04/02/17 0842    Education provided  Yes    Education Details  updated HEP; implications for manual techniques; use and set  up of towel roll to improve postural awareness throughout the day     Person(s) Educated  Patient    Methods  Explanation;Verbal cues;Handout    Comprehension  Verbalized understanding;Returned demonstration       PT Short Term Goals - 03/22/17 1329      PT SHORT TERM GOAL #1   Title  Pt will demo consistency and independence with her HEP.     Time  3    Period  Weeks    Status  New    Target Date  04/12/17      PT SHORT TERM GOAL #2   Title  Pt will demo good understanding of recommended osteoporosis activity modifications evident by her ability to recall atleast 4 key points from handout provided at evaluation.     Time  3    Period  Weeks    Status  New      PT SHORT TERM GOAL #3   Title  Pt will demo proper set up and use of a lumbar roll while sitting in order to decrease pain and improve her posture at work.     Time  3    Period  Weeks    Status  New        PT Long Term Goals - 03/22/17 1331      PT LONG TERM GOAL #1   Title  Pt will demo improved BLE strength to 5/5 MMT which will improve her safety and efficiency with tasks at home and work.      Time  6    Period  Weeks    Status  New      PT LONG TERM GOAL #2   Title  Pt will maintain single leg balance on each LE for atleast 15 sec without LOB or significant trendlenburg deviation, 2/3 tirals, which will decrease her risk of falls and injury.     Time  6    Period  Weeks    Status  New      PT LONG TERM GOAL #3   Title  Pt will demo proper lifting mechanics, evident by her ability to lift a 10lb object from the floor without excessive trunk flexion or the need for therapist cuing, which will decrease her risk of injury throughout the day.     Time  6    Period  Weeks    Status  New            Plan - 04/02/17 0843    Clinical Impression Statement  Pt arrived today reporting minimal change since her initial evaluation, due to this being her first treatment session. She continues to report Rt lateral hip discomfort with sit to stand activity and this limited full participation in LE therex this session. Therapist completed manual techniques to address significant muscle spasm along the gluteals, noting significant improvements in the pts ability to complete sit to stand without pain and improved valgus control. Therapist updated pt's HEP with good demonstration of understanding. Will continue with current POC.    Rehab Potential  Good    PT Frequency  2x / week    PT Duration  6 weeks    PT Treatment/Interventions  ADLs/Self Care Home Management;Balance training;Therapeutic exercise;Therapeutic activities;Functional mobility training;Neuromuscular re-education;Patient/family education;Passive range of motion;Manual techniques;Dry needling    PT Next Visit Plan  follow up on self massage; progress LE strength and balance     PT Home Exercise Plan  bridge, supine clams with  green TB, tandem balance, do/dont of osteoporosis, hamstring curls, towel roll     Consulted and Agree with Plan of Care  Patient       Patient will benefit from skilled therapeutic intervention in  order to improve the following deficits and impairments:  Decreased balance, Decreased activity tolerance, Improper body mechanics, Pain, Postural dysfunction, Decreased strength, Decreased safety awareness  Visit Diagnosis: Muscle weakness (generalized)  Unsteadiness on feet  Chronic low back pain, unspecified back pain laterality, with sciatica presence unspecified     Problem List Patient Active Problem List   Diagnosis Date Noted  . Tinnitus of left ear 08/12/2011  . Nailbed injury 08/12/2011  . Sensorineural hearing loss, unilateral 08/12/2011  . ADENOCARCINOMA, BREAST 10/07/2007  . ADENOMATOUS COLONIC POLYP 10/07/2007  . HEMORRHOIDS 10/07/2007  . GERD 10/07/2007  . GASTRITIS 10/07/2007  . INGUINAL HERNIA 10/07/2007  . CHOLELITHIASIS 10/07/2007  . ARTHRITIS 10/07/2007    9:36 AM,04/02/17 Elly Modena PT, DPT [facility] [address] [place], [place], [address] Phone: [phone]   Fax:  [phone]  Name: Stephanie Bautista MRN: [id number] Date of Birth: 03-31-1951

## 2017-04-07 ENCOUNTER — Ambulatory Visit: Payer: Medicare Other | Admitting: Physical Therapy

## 2017-04-07 DIAGNOSIS — R2681 Unsteadiness on feet: Secondary | ICD-10-CM

## 2017-04-07 DIAGNOSIS — M545 Low back pain: Secondary | ICD-10-CM | POA: Diagnosis not present

## 2017-04-07 DIAGNOSIS — G8929 Other chronic pain: Secondary | ICD-10-CM

## 2017-04-07 DIAGNOSIS — M6281 Muscle weakness (generalized): Secondary | ICD-10-CM

## 2017-04-07 NOTE — Therapy (Signed)
[facility] [address] [place], [place], [address] Phone: [phone]   Fax:  [phone]  Physical Therapy Treatment  Patient Details  Name: Stephanie Bautista MRN: [id number] Date of Birth: 09-Mar-1951 Referring Provider: Delrae Rend, MD    Encounter Date: 04/07/2017  PT End of Session - 04/07/17 0818    Visit Number  3    Number of Visits  12    Date for PT Re-Evaluation  05/03/17    Authorization Type  Medicare A and B     Authorization Time Period  03/22/17 to 05/05/17    Authorization - Visit Number  3    Authorization - Number of Visits  10    PT Start Time  0802    PT Stop Time  6803    PT Time Calculation (min)  40 min    Activity Tolerance  Patient tolerated treatment well;No increased pain    Behavior During Therapy  WFL for tasks assessed/performed       Past Medical History:  Diagnosis Date  . Acoustic neuroma ([place])   . Adenomatous colon polyp 12/2002  . Arthritis   . Breast cancer ([place])   . Cholelithiases   . Esophageal stricture   . GERD (gastroesophageal reflux disease)   . Hemorrhoids   . Hiatal hernia   . Inguinal hernia   . UTI (lower urinary tract infection)     Past Surgical History:  Procedure Laterality Date  . BREAST LUMPECTOMY    . CESAREAN SECTION     2x  . CHOLECYSTECTOMY  2008  . HEMORRHOID SURGERY  2007  . INGUINAL HERNIA REPAIR  1968  . PARTIAL HYSTERECTOMY  1996  . TONSILLECTOMY      There were no vitals filed for this visit.  Subjective Assessment - 04/07/17 0804    Subjective  Pt reports that things are going well. She continued with sit to stands into the  evening following her last session, pain free. For the next couple of days, she was a little sore from all of the extra work. No pain currently.     Pertinent History  breast cancer    Patient Stated Goals  strengthen bones and muscles and prevent risk of falling     Currently in Pain?  No/denies                       [facility] Adult PT Treatment/Exercise - 04/07/17 0001      Knee/Hip Exercises: Standing   Other Standing Knee Exercises  hip extension and hip abduction 2x10 reps with yellow TB;     Other Standing Knee Exercises  rows with green TB 2x10 reps, horizontal abduction 2x15 reps with red TB; heel raises x25 reps, toe raises x25 reps with heel lifted       Knee/Hip Exercises: Seated   Other Seated Knee/Hip Exercises  D2 flfexion with yellow TB 2x10 reps each on dyna disc     Sit to Sand  15 reps;2 sets;without UE support;Other (comment) on foam           Balance Exercises - 04/07/17 0834      Balance Exercises: Standing   Tandem Stance  Eyes open;Foam/compliant surface;2 reps;30 secs    SLS  Solid surface;2 reps;30 secs;Eyes open    Tandem Gait  Forward;2 reps        PT Education - 04/07/17 [phone]    Education provided  Yes  Education Details  updated HEP; educated pt on osteoporosis and importance of avoiding excessive flexion/benefits of PT in improving strength and balance to avoid injury in the future     Person(s) Educated  Patient    Methods  Explanation;Handout    Comprehension  Verbalized understanding       PT Short Term Goals - 04/07/17 0844      PT SHORT TERM GOAL #1   Title  Pt will demo consistency and independence with her HEP.     Time  3    Period  Weeks    Status  Achieved      PT SHORT TERM GOAL #2   Title  Pt will demo good understanding of recommended osteoporosis activity modifications evident by her ability to recall atleast 4 key points from handout provided at evaluation.     Time  3    Period  Weeks    Status  On-going      PT SHORT TERM GOAL #3   Title  Pt will demo proper set up and use of a lumbar roll while sitting in order to decrease pain and improve her posture at work.     Time  3    Period  Weeks    Status  Achieved        PT Long Term Goals - 03/22/17 1331      PT LONG TERM GOAL #1   Title  Pt will  demo improved BLE strength to 5/5 MMT which will improve her safety and efficiency with tasks at home and work.     Time  6    Period  Weeks    Status  New      PT LONG TERM GOAL #2   Title  Pt will maintain single leg balance on each LE for atleast 15 sec without LOB or significant [place] deviation, 2/3 tirals, which will decrease her risk of falls and injury.     Time  6    Period  Weeks    Status  New      PT LONG TERM GOAL #3   Title  Pt will demo proper lifting mechanics, evident by her ability to lift a 10lb object from the floor without excessive trunk flexion or the need for therapist cuing, which will decrease her risk of injury throughout the day.     Time  6    Period  Weeks    Status  New            Plan - 04/07/17 9381    Clinical Impression Statement  Pt arrived today reporting her Rt hip pain was fully resolved following her last session and continues to not bother her. Pt was able to complete more functional strengthening therex this session without difficulty. Also included balance activity, with some increased unsteadiness during single leg activities initially, however this was much improved with some cuing from the therapist. Pt is making steady progress towards her goals and would continue to benefit from skilled PT to address deficits in strength, balance and improve understanding of osteoporosis diagnosis.      Rehab Potential  Good    PT Frequency  2x / week    PT Duration  6 weeks    PT Treatment/Interventions  ADLs/Self Care Home Management;Balance training;Therapeutic exercise;Therapeutic activities;Functional mobility training;Neuromuscular re-education;Patient/family education;Passive range of motion;Manual techniques;Dry needling    PT Next Visit Plan  progress LE strength and balance     PT Home Exercise  Plan  bridge, supine clams with green TB, tandem balance, do/dont of osteoporosis, hamstring curls, towel roll, rows green band, standing hip  ext/abd yellow band     Recommended Other Services  MD signed orders     Consulted and Agree with Plan of Care  Patient       Patient will benefit from skilled therapeutic intervention in order to improve the following deficits and impairments:  Decreased balance, Decreased activity tolerance, Improper body mechanics, Pain, Postural dysfunction, Decreased strength, Decreased safety awareness  Visit Diagnosis: Muscle weakness (generalized)  Unsteadiness on feet  Chronic low back pain, unspecified back pain laterality, with sciatica presence unspecified     Problem List Patient Active Problem List   Diagnosis Date Noted  . Tinnitus of left ear 08/12/2011  . Nailbed injury 08/12/2011  . Sensorineural hearing loss, unilateral 08/12/2011  . ADENOCARCINOMA, BREAST 10/07/2007  . ADENOMATOUS COLONIC POLYP 10/07/2007  . HEMORRHOIDS 10/07/2007  . GERD 10/07/2007  . GASTRITIS 10/07/2007  . INGUINAL HERNIA 10/07/2007  . CHOLELITHIASIS 10/07/2007  . ARTHRITIS 10/07/2007   8:45 AM,04/07/17 Elly Modena PT, DPT [facility] [address], [place], [place], [address] Phone: [phone]   Fax:  [phone]  Name: Orva B Paullin MRN: [id number] Date of Birth: 1950-08-08

## 2017-04-07 NOTE — Patient Instructions (Signed)
  LOOPED ELASTIC BAND HIP EXTENSION  While standing with an elastic band looped around your ankles, move the target leg back as shown.   Keep your knees straight the entire time.     2x10-15 reps      LOOPED ELASTIC BAND HIP ABDUCTION  While standing with an elastic band looped around your ankles, move the target leg out to the side as shown.     2x10-15 reps   [facility] [address], [place], [place] [address] Phone # [phone] Fax [phone]

## 2017-04-09 ENCOUNTER — Encounter: Payer: Medicare Other | Admitting: Physical Therapy

## 2017-04-12 ENCOUNTER — Ambulatory Visit: Payer: Medicare Other | Admitting: Physical Therapy

## 2017-04-12 DIAGNOSIS — M545 Low back pain: Secondary | ICD-10-CM

## 2017-04-12 DIAGNOSIS — G8929 Other chronic pain: Secondary | ICD-10-CM

## 2017-04-12 DIAGNOSIS — R2681 Unsteadiness on feet: Secondary | ICD-10-CM

## 2017-04-12 DIAGNOSIS — M6281 Muscle weakness (generalized): Secondary | ICD-10-CM

## 2017-04-12 NOTE — Therapy (Addendum)
[facility] [address] [place], [place], [address] Phone: [phone]   Fax:  [phone]  Physical Therapy Treatment/Discharge  Patient Details  Name: Stephanie Bautista MRN: [id number] Date of Birth: 06-15-1950 Referring Provider: Delrae Rend, MD    Encounter Date: 04/12/2017  PT End of Session - 04/12/17 1554    Visit Number  4    Number of Visits  12    Date for PT Re-Evaluation  05/03/17    Authorization Type  Medicare A and B     Authorization Time Period  03/22/17 to 05/05/17    Authorization - Visit Number  4    Authorization - Number of Visits  10    PT Start Time  3614    PT Stop Time  1610    PT Time Calculation (min)  39 min    Activity Tolerance  Patient tolerated treatment well;No increased pain    Behavior During Therapy  WFL for tasks assessed/performed       Past Medical History:  Diagnosis Date  . Acoustic neuroma ([place])   . Adenomatous colon polyp 12/2002  . Arthritis   . Breast cancer ([place])   . Cholelithiases   . Esophageal stricture   . GERD (gastroesophageal reflux disease)   . Hemorrhoids   . Hiatal hernia   . Inguinal hernia   . UTI (lower urinary tract infection)     Past Surgical History:  Procedure Laterality Date  . BREAST LUMPECTOMY    . CESAREAN SECTION     2x  . CHOLECYSTECTOMY  2008  . HEMORRHOID SURGERY  2007  . INGUINAL HERNIA REPAIR  1968  . PARTIAL HYSTERECTOMY  1996  . TONSILLECTOMY      There were no vitals filed for this visit.  Subjective Assessment - 04/12/17 1533    Subjective  Pt reports that last night she started having pain along the front of her proximal thigh, which was improved after taking a muscle relaxer.     Pertinent History  breast cancer    Patient Stated Goals  strengthen bones and muscles and prevent risk of falling     Currently in Pain?  No/denies                      [facility] Adult PT Treatment/Exercise - 04/12/17 0001       Knee/Hip Exercises: Stretches   Other Knee/Hip Stretches  hip flexor stretch in supine 3x30 sec each; standing ITB stretch 2x30 sec      Knee/Hip Exercises: Standing   Heel Raises  20 reps;Both;1 set;Limitations    Heel Raises Limitations  on incline    Forward Step Up  2 sets;10 reps;Hand Hold: 0;Step Height: 4";Limitations    Forward Step Up Limitations  foam pad on step, contralateral knee drive, second set with 2 sec hold     Other Standing Knee Exercises  sidestepping along counter x4 RT      Knee/Hip Exercises: Seated   Other Seated Knee/Hip Exercises  red TB low rows sitting on physioball 2x15 reps     Other Seated Knee/Hip Exercises  D2 flexion with red TB 2x15 reps, sitting on physioball        Knee/Hip Exercises: Sidelying   Hip ABduction  2 sets;10 reps;1 set    Other Sidelying Knee/Hip Exercises  self trigger point release/massage Rt hip region x4 min      Manual Therapy   Soft tissue mobilization  STM Rt TFL             PT Education - 04/12/17 1601    Education provided  Yes    Education Details  discussed the possibility of increased soreness; self massage/trigger point release     Person(s) Educated  Patient    Methods  Explanation    Comprehension  Verbalized understanding;Returned demonstration       PT Short Term Goals - 04/07/17 0844      PT SHORT TERM GOAL #1   Title  Pt will demo consistency and independence with her HEP.     Time  3    Period  Weeks    Status  Achieved      PT SHORT TERM GOAL #2   Title  Pt will demo good understanding of recommended osteoporosis activity modifications evident by her ability to recall atleast 4 key points from handout provided at evaluation.     Time  3    Period  Weeks    Status  On-going      PT SHORT TERM GOAL #3   Title  Pt will demo proper set up and use of a lumbar roll while sitting in order to decrease pain and improve her posture at work.     Time  3    Period  Weeks    Status  Achieved         PT Long Term Goals - 03/22/17 1331      PT LONG TERM GOAL #1   Title  Pt will demo improved BLE strength to 5/5 MMT which will improve her safety and efficiency with tasks at home and work.     Time  6    Period  Weeks    Status  New      PT LONG TERM GOAL #2   Title  Pt will maintain single leg balance on each LE for atleast 15 sec without LOB or significant [place] deviation, 2/3 tirals, which will decrease her risk of falls and injury.     Time  6    Period  Weeks    Status  New      PT LONG TERM GOAL #3   Title  Pt will demo proper lifting mechanics, evident by her ability to lift a 10lb object from the floor without excessive trunk flexion or the need for therapist cuing, which will decrease her risk of injury throughout the day.     Time  6    Period  Weeks    Status  New            Plan - 04/12/17 1611    Clinical Impression Statement  Pt arrived today reporting increase in Rt groin/anterior thigh pain last night following her marches. Pt presents with muscle tenderness along the TFL, glute medius region during palpation however there was no specific pain at arrival, so this was monitored during the session. Pt demonstrated no significant difficulty with therex completed, but did require intermittent cuing to slow her pace. Ended with self trigger point release and massage to the Rt hip region and encouraged pt to complete this at home as well for pain relief. Pt verbalized and demonstrated understanding of this.    Rehab Potential  Good    PT Frequency  2x / week    PT Duration  6 weeks    PT Treatment/Interventions  ADLs/Self Care Home Management;Balance training;Therapeutic exercise;Therapeutic activities;Functional mobility training;Neuromuscular re-education;Patient/family education;Passive range of motion;Manual techniques;Dry  needling    PT Next Visit Plan  progress LE strength and balance     PT Home Exercise Plan  bridge, supine clams with green TB,  tandem balance, do/dont of osteoporosis, hamstring curls, towel roll, rows green band, standing hip ext/abd yellow band     Consulted and Agree with Plan of Care  Patient       Patient will benefit from skilled therapeutic intervention in order to improve the following deficits and impairments:  Decreased balance, Decreased activity tolerance, Improper body mechanics, Pain, Postural dysfunction, Decreased strength, Decreased safety awareness  Visit Diagnosis: Muscle weakness (generalized)  Unsteadiness on feet  Chronic low back pain, unspecified back pain laterality, with sciatica presence unspecified     Problem List Patient Active Problem List   Diagnosis Date Noted  . Tinnitus of left ear 08/12/2011  . Nailbed injury 08/12/2011  . Sensorineural hearing loss, unilateral 08/12/2011  . ADENOCARCINOMA, BREAST 10/07/2007  . ADENOMATOUS COLONIC POLYP 10/07/2007  . HEMORRHOIDS 10/07/2007  . GERD 10/07/2007  . GASTRITIS 10/07/2007  . INGUINAL HERNIA 10/07/2007  . CHOLELITHIASIS 10/07/2007  . ARTHRITIS 10/07/2007    4:15 PM,04/12/17 Elly Modena PT, DPT [facility] [address], [place], [place], [address] Phone: [phone]   Fax:  [phone]  Name: Stephanie Bautista MRN: [id number] Date of Birth: Jun 14, 1950    *Addendum to resolve episode of care and d/c pt from PT.  [place]  Visits from Start of Care: 4  Current functional level related to goals / functional outcomes: See above for more details    Remaining deficits: See above for more details    Education / Equipment: See above for more details  Plan: Patient agrees to discharge.  Patient goals were partially met. Patient is being discharged due to not returning since the last visit.  ?????     4:26 PM,06/14/17 Sherol Dade PT, [place] [facility] [place]

## 2017-04-19 ENCOUNTER — Encounter: Payer: Medicare Other | Admitting: Physical Therapy

## 2017-04-21 ENCOUNTER — Encounter: Payer: Medicare Other | Admitting: Physical Therapy

## 2017-06-08 DIAGNOSIS — H918X2 Other specified hearing loss, left ear: Secondary | ICD-10-CM | POA: Diagnosis not present

## 2017-06-08 DIAGNOSIS — H905 Unspecified sensorineural hearing loss: Secondary | ICD-10-CM | POA: Diagnosis not present

## 2017-06-08 DIAGNOSIS — H903 Sensorineural hearing loss, bilateral: Secondary | ICD-10-CM | POA: Diagnosis not present

## 2017-06-08 DIAGNOSIS — E237 Disorder of pituitary gland, unspecified: Secondary | ICD-10-CM | POA: Diagnosis not present

## 2017-06-08 DIAGNOSIS — D333 Benign neoplasm of cranial nerves: Secondary | ICD-10-CM | POA: Diagnosis not present

## 2017-06-08 DIAGNOSIS — H6121 Impacted cerumen, right ear: Secondary | ICD-10-CM | POA: Diagnosis not present

## 2017-07-16 DIAGNOSIS — N6321 Unspecified lump in the left breast, upper outer quadrant: Secondary | ICD-10-CM | POA: Diagnosis not present

## 2017-07-16 DIAGNOSIS — Z853 Personal history of malignant neoplasm of breast: Secondary | ICD-10-CM | POA: Diagnosis not present

## 2017-07-16 DIAGNOSIS — Z1231 Encounter for screening mammogram for malignant neoplasm of breast: Secondary | ICD-10-CM | POA: Diagnosis not present

## 2017-07-20 ENCOUNTER — Encounter: Payer: Medicare Other | Admitting: Genetic Counselor

## 2017-07-20 ENCOUNTER — Other Ambulatory Visit: Payer: Medicare Other

## 2017-07-22 ENCOUNTER — Other Ambulatory Visit: Payer: Self-pay | Admitting: Radiology

## 2017-07-22 DIAGNOSIS — C50912 Malignant neoplasm of unspecified site of left female breast: Secondary | ICD-10-CM | POA: Diagnosis not present

## 2017-07-22 DIAGNOSIS — N6321 Unspecified lump in the left breast, upper outer quadrant: Secondary | ICD-10-CM | POA: Diagnosis not present

## 2017-07-22 DIAGNOSIS — N6089 Other benign mammary dysplasias of unspecified breast: Secondary | ICD-10-CM | POA: Diagnosis not present

## 2017-07-22 DIAGNOSIS — R599 Enlarged lymph nodes, unspecified: Secondary | ICD-10-CM | POA: Diagnosis not present

## 2017-07-26 ENCOUNTER — Telehealth: Payer: Self-pay | Admitting: *Deleted

## 2017-07-26 ENCOUNTER — Other Ambulatory Visit: Payer: Self-pay | Admitting: General Surgery

## 2017-07-26 ENCOUNTER — Other Ambulatory Visit: Payer: Self-pay | Admitting: Oncology

## 2017-07-26 DIAGNOSIS — C50412 Malignant neoplasm of upper-outer quadrant of left female breast: Secondary | ICD-10-CM | POA: Diagnosis not present

## 2017-07-26 DIAGNOSIS — C50411 Malignant neoplasm of upper-outer quadrant of right female breast: Secondary | ICD-10-CM

## 2017-07-26 DIAGNOSIS — C50912 Malignant neoplasm of unspecified site of left female breast: Secondary | ICD-10-CM

## 2017-07-26 NOTE — Progress Notes (Signed)
[place]  [phone]) [phone] [phone]) [phone]     ID: Stephanie Bautista DOB: 10/09/1950  MR#: [id number]  JKK#:[id number]  Patient Care Team: Haywood Pao, MD as PCP - General (Internal Medicine) Magrinat, Virgie Dad, MD as Consulting Physician (Oncology) Rolm Bookbinder, MD as Consulting Physician (General Surgery) Maisie Fus, MD as Consulting Physician (Obstetrics and Gynecology) Wilfrid Lund, MD as Consulting Physician (Otolaryngology) Delrae Rend, MD as Consulting Physician (Endocrinology) Verner Chol, MD as Consulting Physician (Sports Medicine) Lavonia Dana, MD as Referring Physician (Otolaryngology) OTHER MD:  CHIEF COMPLAINT: Triple negative breast cancer  CURRENT TREATMENT: Awaiting definitive surgery   HISTORY OF CURRENT ILLNESS: I saw Anayla remotely for her history of ductal carcinoma in situ of the right breast, treated with lumpectomy 09/02/1994, with the pathology showing ([id number]) ductal carcinoma in situ.  There was no invasive component.  This was followed by radiation given between 09/21/94 and 11/04/94, with a total of 71696 centigray to the right breast and a total of 604 0 cGy to the tumor bed.  She notes that she didn't take any anti-estrogen therapy at that time.  More recently, Jenisa had routine screening mammography at [facility] on 07/16/2017 showing a possible abnormality in the left breast.  The breast density was category B.  She underwent left breast ultrasound on 07/16/2017 showing: a 1.5 cm irregular mass in the left breast upper outer quadrant posterior depth is consistent with carcinoma. A 6 mm irregular mass in the left breast upper outer quadrant posterior depth was also highly suggestive for malignancy. A lymph node in the left axillary tail was read as suspicious of malignancy.   Accordingly on 07/22/2017 she proceeded to biopsy of the left breast area in question as well as the suspicious left axillary lymph  node. The pathology from this procedure showed ([id number]): Lymph node, needle/core biopsy, left axilla with no evidence of carcinoma-- concordant. Breast, left, needle core biopsy, invasive ductal carcinoma, grade 3, prognostic indicators significant for: ER, 0% negative and PR, 0% negative. Proliferation marker Ki67 at 50%. HER2 negative signals ratio is 1.36, and the number per cell 1.70 .Breast, left, needle core biopsy, architectural distortion consistent with fibroadenoma--discordant.  She has a bilateral breast MRI scheduled for 07/28/2017.  The patient's subsequent history is as detailed below.  INTERVAL HISTORY: Nolah was evaluated in breast clinic accompanied by her husband, Kasandra Knudsen.  She followed up with her surgeon, Dr. Donne Hazel this morning and noted that she had a hematoma underneath her left axilla and prescribed hydrocodone for the relief of her symptoms. She has been using an ice pack and applying pressure to the area.    She reports that she no longer has her gallbladder and she had a hysterectomy with only her right ovary left. She denies any skin cancers at this time.    REVIEW OF SYSTEMS: Jya reports that she has constipation chronically. For exercise, she rides bicycles and she walks. She denies unusual headaches, visual changes, nausea, vomiting, or dizziness. There has been no unusual cough, phlegm production, or pleurisy. This been no change in bowel or bladder habits. She denies unexplained fatigue or unexplained weight loss, bleeding, rash, or fever. A detailed review of systems was otherwise stable.    PAST MEDICAL HISTORY: Past Medical History:  Diagnosis Date  . Acoustic neuroma ([place])   . Adenomatous colon polyp 12/2002  . Arthritis   . Breast cancer ([place]) 1996   right breast cancer in 1996, left breast cancer  in 2019  . Cholelithiases   . Esophageal stricture   . Family history of breast cancer   . Family history of ovarian cancer   . Family history of  pancreatic cancer   . Family history of prostate cancer   . GERD (gastroesophageal reflux disease)   . Hemorrhoids   . Hiatal hernia   . History of colon polyps   . Inguinal hernia   . UTI (lower urinary tract infection)   As far as PMHx she has had intermittent back pain (spinal stenosis) x 2 years,  Osteoporosis, random migraines, early cataracts and early hear loss due to schwannoma.   PAST SURGICAL HISTORY: Past Surgical History:  Procedure Laterality Date  . BREAST LUMPECTOMY    . CESAREAN SECTION     2x  . CHOLECYSTECTOMY  2008  . HEMORRHOID SURGERY  2007  . INGUINAL HERNIA REPAIR  1968  . PARTIAL HYSTERECTOMY  1996  . TONSILLECTOMY      FAMILY HISTORY Family History  Problem Relation Age of Onset  . Heart disease Mother   . Melanoma Mother        dx in her 64s  . Ovarian cancer Mother 32  . Heart disease Father   . Diabetes Father   . Stroke Father   . Ovarian cancer Sister 70  . Pancreatic cancer Maternal Aunt   . Bone cancer Maternal Uncle   . Breast cancer Paternal Aunt   . Non-Hodgkin's lymphoma Maternal Grandmother   . Heart disease Maternal Grandfather   . Heart disease Paternal Grandmother   . Prostate cancer Paternal Grandfather   . Stroke Maternal Uncle   . Bone cancer Maternal Uncle   . Cancer Maternal Aunt        NOS  . Breast cancer Cousin        daughter of mat aunt with NOS cancer  Her mother died from CHF at age 106. Her father died from cardiac issues at age 59. She doesn't have any brothers, but has 3 sisters. She had one sister and her mother with ovarian cancer. Her sister was age 47 when she was diagnosed with ovarian cancer and her mother was in her late 43's when she was diagnosed with ovarian cancer. Her sister was not tested genetically following this. Her maternal grandmother had non-hodgkins lymphoma. Her paternal grandfather had prostate cancer. Her paternal aunt had breast cancer. Her paternal aunt's daughter had breast cancer. She had  several uncles that had prostate cancer that spread to the bones.   GYNECOLOGIC HISTORY:  No LMP recorded. Patient has had a hysterectomy. Menarche: 67 years old Age at first live birth: 67 years old [place] P2 LMP: hysterectomy at age 32 S/p Hysterectomy with USO (L)  SOCIAL HISTORY: She has been retired from Medical sales representative work since 05/26/2017. Her husband, Kasandra Knudsen has been retired from the [employer] for the past 9 years. Their son is Leroy Sea is 52 and a IT trainer. Their daughter, Adonis Brook lives in [place], is 62 and works in child care at [employer]. The patient has 6 grandchildren ( 2 boys and 4 girls). She is a Psychologist, forensic.      ADVANCED DIRECTIVES:    HEALTH MAINTENANCE: Social History   Tobacco Use  . Smoking status: Never Smoker  . Smokeless tobacco: Never Used  Substance Use Topics  . Alcohol use: No  . Drug use: No     Colonoscopy: Dr. Fuller Plan.   PAP: Dr Nori Riis  Bone density: at  Dr. Verlon Au office/ osteoporosis.    Allergies  Allergen Reactions  . Amoxicillin Itching and Rash    Current Outpatient Medications  Medication Sig Dispense Refill  . acetaminophen (TYLENOL) 500 MG tablet Take 500 mg by mouth every 6 (six) hours as needed.    Marland Kitchen alum & mag hydroxide-simeth (MAALOX/MYLANTA) 200-200-20 MG/5ML suspension Take by mouth every 6 (six) hours as needed for indigestion or heartburn.    . baclofen (LIORESAL) 10 MG tablet Take 10 mg by mouth as needed for muscle spasms.    . fluconazole (DIFLUCAN) 150 MG tablet Take 1 tablet (150 mg total) by mouth daily. (Patient not taking: Reported on 07/27/2017) 1 tablet 1  . HYDROcodone-acetaminophen (NORCO) 5-325 MG tablet Take 1 tablet by mouth every 6 (six) hours as needed for moderate pain. 20 tablet 0  . omeprazole (PRILOSEC) 40 MG capsule Take 1 capsule (40 mg total) by mouth 2 (two) times daily. (Patient not taking: Reported on 07/27/2017) 60 capsule 11   Current Facility-Administered Medications  Medication Dose  Route Frequency Provider Last Rate Last Dose  . 0.9 %  sodium chloride infusion  500 mL Intravenous Continuous Ladene Artist, MD        OBJECTIVE: Middle-aged white woman who appears stated age  Vitals:   07/27/17 1621  BP: (!) 158/74  Pulse: 65  Resp: 20  Temp: 97.6 F (36.4 C)  SpO2: 100%     Body mass index is 25.9 kg/m.   Wt Readings from Last 3 Encounters:  07/27/17 141 lb 9.6 oz (64.2 kg)  07/29/16 128 lb (58.1 kg)  06/18/16 128 lb (58.1 kg)      ECOG FS:1 - Symptomatic but completely ambulatory  Ocular: Sclerae unicteric, pupils round and equal Ear-nose-throat: Oropharynx clear and moist Lymphatic: No cervical or supraclavicular adenopathy Lungs no rales or rhonchi Heart regular rate and rhythm Abd soft, nontender, positive bowel sounds MSK no focal spinal tenderness, no joint edema Neuro: non-focal, well-oriented, appropriate affect Breasts: The right breast is status post remote lumpectomy and radiation.  There is no evidence of local recurrence.  The right axilla is benign.  The left breast is status post recent biopsy.  There is a very large ecchymosis in the breast is swollen and noticeably larger than the right.  The left axilla is benign   LAB RESULTS:  CMP     Component Value Date/Time   NA 141 07/27/2017 1007   K 4.5 07/27/2017 1007   CL 106 07/27/2017 1007   CO2 26 07/27/2017 1007   GLUCOSE 76 07/27/2017 1007   BUN 10 07/27/2017 1007   CREATININE 0.81 07/27/2017 1007   CREATININE 0.69 11/16/2011 1054   CALCIUM 9.8 07/27/2017 1007   PROT 7.4 07/27/2017 1007   ALBUMIN 4.1 07/27/2017 1007   AST 16 07/27/2017 1007   ALT 12 07/27/2017 1007   ALKPHOS 81 07/27/2017 1007   BILITOT 0.6 07/27/2017 1007   GFRNONAA >60 07/27/2017 1007   GFRAA >60 07/27/2017 1007    No results found for: TOTALPROTELP, ALBUMINELP, A1GS, A2GS, BETS, BETA2SER, GAMS, MSPIKE, SPEI  No results found for: KPAFRELGTCHN, LAMBDASER, KAPLAMBRATIO  Lab Results  Component  Value Date   WBC 5.6 07/27/2017   NEUTROABS 3.6 07/27/2017   HGB 14.3 11/16/2011   HCT 42.4 07/27/2017   MCV 88.3 07/27/2017   PLT 203 07/27/2017    '@LASTCHEMISTRY'$ @  No results found for: LABCA2  No components found for: [id number]  No results for input(s): INR in the last  168 hours.  No results found for: LABCA2  No results found for: ZOX096  No results found for: EAV409  No results found for: WJX914  No results found for: CA2729  No components found for: HGQUANT  No results found for: CEA1 / No results found for: CEA1   No results found for: AFPTUMOR  No results found for: CHROMOGRNA  No results found for: PSA1  Appointment on 07/27/2017  Component Date Value Ref Range Status  . WBC Count 07/27/2017 5.6  3.9 - 10.3 K/uL Final  . RBC 07/27/2017 4.80  3.70 - 5.45 MIL/uL Final  . Hemoglobin 07/27/2017 13.7  11.6 - 15.9 g/dL Final  . HCT 07/27/2017 42.4  34.8 - 46.6 % Final  . MCV 07/27/2017 88.3  79.5 - 101.0 fL Final  . MCH 07/27/2017 28.5  25.1 - 34.0 pg Final  . MCHC 07/27/2017 32.3  31.5 - 36.0 g/dL Final  . RDW 07/27/2017 13.1  11.2 - 14.5 % Final  . Platelet Count 07/27/2017 203  145 - 400 K/uL Final  . Neutrophils Relative % 07/27/2017 63  % Final  . Neutro Abs 07/27/2017 3.6  1.5 - 6.5 K/uL Final  . Lymphocytes Relative 07/27/2017 26  % Final  . Lymphs Abs 07/27/2017 1.4  0.9 - 3.3 K/uL Final  . Monocytes Relative 07/27/2017 9  % Final  . Monocytes Absolute 07/27/2017 0.5  0.1 - 0.9 K/uL Final  . Eosinophils Relative 07/27/2017 2  % Final  . Eosinophils Absolute 07/27/2017 0.1  0.0 - 0.5 K/uL Final  . Basophils Relative 07/27/2017 0  % Final  . Basophils Absolute 07/27/2017 0.0  0.0 - 0.1 K/uL Final   Performed at [facility], [address] [place], [place] [address]  . Sodium 07/27/2017 141  136 - 145 mmol/L Final  . Potassium 07/27/2017 4.5  3.5 - 5.1 mmol/L Final  . Chloride 07/27/2017 106  98 - 109 mmol/L Final  . CO2  07/27/2017 26  22 - 29 mmol/L Final  . Glucose, Bld 07/27/2017 76  70 - 140 mg/dL Final  . BUN 07/27/2017 10  7 - 26 mg/dL Final  . Creatinine 07/27/2017 0.81  0.60 - 1.10 mg/dL Final  . Calcium 07/27/2017 9.8  8.4 - 10.4 mg/dL Final  . Total Protein 07/27/2017 7.4  6.4 - 8.3 g/dL Final  . Albumin 07/27/2017 4.1  3.5 - 5.0 g/dL Final  . AST 07/27/2017 16  5 - 34 U/L Final  . ALT 07/27/2017 12  0 - 55 U/L Final  . Alkaline Phosphatase 07/27/2017 81  40 - 150 U/L Final  . Total Bilirubin 07/27/2017 0.6  0.2 - 1.2 mg/dL Final  . GFR, Est Non Af Am 07/27/2017 >60  >60 mL/min Final  . GFR, Est AFR Am 07/27/2017 >60  >60 mL/min Final   Comment: (NOTE) The eGFR has been calculated using the CKD EPI equation. This calculation has not been validated in all clinical situations. eGFR's persistently <60 mL/min signify possible Chronic Kidney Disease.   Georgiann Hahn gap 07/27/2017 9  3 - 11 Final   Performed at [facility], [place] [address], [place], [place] [address]    (this displays the last labs from the last 3 days)  No results found for: TOTALPROTELP, ALBUMINELP, A1GS, A2GS, BETS, BETA2SER, GAMS, MSPIKE, SPEI (this displays SPEP labs)  No results found for: KPAFRELGTCHN, LAMBDASER, KAPLAMBRATIO (kappa/lambda light chains)  No results found for: HGBA, HGBA2QUANT, HGBFQUANT, HGBSQUAN (Hemoglobinopathy  evaluation)   No results found for: LDH  No results found for: IRON, TIBC, IRONPCTSAT (Iron and TIBC)  No results found for: FERRITIN  Urinalysis    Component Value Date/Time   BILIRUBINUR neg 04/06/2014 1019   PROTEINUR neg 04/06/2014 1019   UROBILINOGEN 0.2 04/06/2014 1019   NITRITE neg 04/06/2014 1019   LEUKOCYTESUR Negative 04/06/2014 1019     STUDIES: Imaging results discussed in depth with the patient  ELIGIBLE FOR AVAILABLE RESEARCH PROTOCOL: BR003 if treated adjuvantly, UPBEAT  ASSESSMENT: 67 y.o. [place] woman  (1) status post right  lumpectomy 09/02/1994 for ductal carcinoma in situ  (a) status post adjuvant radiation 09/21/1994 through 11/04/1994, total 5040 centigrade to the breast and 604 0 cGy to the tumor bed  (b) did not receive adjuvant antiestrogens  (2) status post left breast upper outer quadrant biopsy 07/22/2017 for a clinical T1c pN0, stage IB invasive ductal carcinoma, grade 3, triple negative, with an MIB-1 of 50%.  (a) lymph node biopsy on the same day was negative for malignancy (concordant)  (b) a second area of architectural distortion was also biopsied, read as fibroadenoma (discordant)  (c) a third area of architectural distortion has not yet been biopsied  (d) breast MRI scheduled for 07/28/2017  (3) genetics testing 07/27/2017  (4) chemotherapy will consist of doxorubicin and cyclophosphamide in dose dense fashion x4 followed by paclitaxel weekly x12  (a) if treated adjuvantly the patient may qualify for BR 003 study  (5) definitive surgery pending  (6) adjuvant radiation to follow as appropriate  PLAN: We spent the better part of today's hour-long appointment discussing the biology of her diagnosis and the specifics of her situation. We first reviewed the fact that cancer is not one disease but more than 100 different diseases and that it is important to keep them separate-- otherwise when friends and relatives discuss their own cancer experiences with [place] confusion can result. Similarly we explained that if breast cancer spreads to the bone or liver, the patient would not have bone cancer or liver cancer, but breast cancer in the bone and breast cancer in the liver: one cancer in three places-- not 3 different cancers which otherwise would have to be treated in 3 different ways.  We discussed the difference between local and systemic therapy. In terms of loco-regional treatment, lumpectomy plus radiation is equivalent to mastectomy as far as survival is concerned.  Whether or not Jeannetta will be able  to have a lumpectomy depends on results of the pending MRI and possibly additional left breast biopsies.  If she does have a mastectomy she understands there are different types including nipple sparing, skin sparing, simple, and modified radical.    We also noted that in terms of sequencing of treatments, whether systemic therapy or surgery is done first does not affect the ultimate outcome.  If her surgeon feels it would be best for her to receive chemotherapy first, she would receive the same chemo that she would receive postop and she would obtain the same ultimate results.  Malika had genetics testing today. In patients who carry a deleterious mutation [for example in a  BRCA gene], the risk of a new breast cancer developing in the future may be sufficiently great that the patient may choose bilateral mastectomies. However if she wishes to keep her breasts in that situation it is safe to do so. That would require intensified screening, which generally means not only yearly mammography but a yearly breast MRI as well. Of  course, if there is a BRCA mutation bilateral oophorectomy would be necessary as there is no standard screening protocol for ovarian cancer.  We then discussed the rationale for systemic therapy. There is some risk that this cancer may have already spread to other parts of her body. Patients frequently ask at this point about bone scans, CAT scans and PET scans to find out if they have occult breast cancer somewhere else. The problem is that in early stage disease we are much more likely to find false positives then true cancers and this would expose the patient to unnecessary procedures as well as unnecessary radiation. Scans cannot answer the question the patient really would like to know, which is whether she has microscopic disease elsewhere in her body. For those reasons we do not recommend them.  Of course we would proceed to aggressive evaluation of any symptoms that might suggest  metastatic disease, but that is not the case here.  Next we went over the options for systemic therapy which are anti-estrogens, anti-HER-2 immunotherapy, and chemotherapy. Oliver does not meet criteria for anti-HER-2 immunotherapy or anti-estrogens.  Accordingly her only choice for systemic therapy is chemotherapy and that is my strong recommendation  We discussed the standard chemotherapy in detail and she is acquainted with the possible toxicity side effects and complications of cyclophosphamide, doxorubicin, as well as paclitaxel which will follow.  She understands she will need a port and an echocardiogram. She will also come to chemotherapy school to learn more about the side effects of chemotherapy.  Finally I assured her that even for triple negative cases the prognosis this days with standard therapy is very good, with approximately 90% of patients being alive with no clinical evidence of cancer 5 years post initial diagnosis.  Labresha has a good understanding of the overall plan. She agrees with it. She knows the goal of treatment in her case is cure. She will call with any problems that may develop before her next visit here.  Magrinat, Virgie Dad, MD  07/27/17 5:40 PM Medical Oncology and Hematology [facility] [address] [place],  [address] Tel. [phone]    Fax. [phone]    This document serves as a record of services personally performed by Lurline Del, MD. It was created on his behalf by Steva Colder, a trained medical scribe. The creation of this record is based on the scribe's personal observations and the provider's statements to them.   I have reviewed the above documentation for accuracy and completeness, and I agree with the above.

## 2017-07-26 NOTE — Telephone Encounter (Signed)
Called pt to discuss genetics and new appt with Dr. Jana Hakim. Scheduled and confirmed appt for genetics on 3/5 at 9am and with Dr. Jana Hakim at 4:30pm. Denies further questions at this time.

## 2017-07-27 ENCOUNTER — Inpatient Hospital Stay (HOSPITAL_BASED_OUTPATIENT_CLINIC_OR_DEPARTMENT_OTHER): Payer: Medicare Other | Admitting: Oncology

## 2017-07-27 ENCOUNTER — Inpatient Hospital Stay: Payer: Medicare Other

## 2017-07-27 ENCOUNTER — Telehealth: Payer: Self-pay

## 2017-07-27 ENCOUNTER — Inpatient Hospital Stay: Payer: Medicare Other | Attending: General Surgery | Admitting: Genetic Counselor

## 2017-07-27 ENCOUNTER — Encounter: Payer: Self-pay | Admitting: Genetic Counselor

## 2017-07-27 ENCOUNTER — Encounter: Payer: Self-pay | Admitting: *Deleted

## 2017-07-27 VITALS — BP 158/74 | HR 65 | Temp 97.6°F | Resp 20 | Ht 62.0 in | Wt 141.6 lb

## 2017-07-27 DIAGNOSIS — Z90710 Acquired absence of both cervix and uterus: Secondary | ICD-10-CM

## 2017-07-27 DIAGNOSIS — Z8042 Family history of malignant neoplasm of prostate: Secondary | ICD-10-CM

## 2017-07-27 DIAGNOSIS — Z8041 Family history of malignant neoplasm of ovary: Secondary | ICD-10-CM | POA: Insufficient documentation

## 2017-07-27 DIAGNOSIS — K5900 Constipation, unspecified: Secondary | ICD-10-CM

## 2017-07-27 DIAGNOSIS — Z808 Family history of malignant neoplasm of other organs or systems: Secondary | ICD-10-CM | POA: Insufficient documentation

## 2017-07-27 DIAGNOSIS — Z860100 Personal history of colon polyps, unspecified: Secondary | ICD-10-CM | POA: Insufficient documentation

## 2017-07-27 DIAGNOSIS — C50412 Malignant neoplasm of upper-outer quadrant of left female breast: Secondary | ICD-10-CM | POA: Insufficient documentation

## 2017-07-27 DIAGNOSIS — Z171 Estrogen receptor negative status [ER-]: Secondary | ICD-10-CM | POA: Insufficient documentation

## 2017-07-27 DIAGNOSIS — Z86000 Personal history of in-situ neoplasm of breast: Secondary | ICD-10-CM | POA: Diagnosis not present

## 2017-07-27 DIAGNOSIS — H905 Unspecified sensorineural hearing loss: Secondary | ICD-10-CM

## 2017-07-27 DIAGNOSIS — Z809 Family history of malignant neoplasm, unspecified: Secondary | ICD-10-CM | POA: Diagnosis not present

## 2017-07-27 DIAGNOSIS — M810 Age-related osteoporosis without current pathological fracture: Secondary | ICD-10-CM

## 2017-07-27 DIAGNOSIS — C50212 Malignant neoplasm of upper-inner quadrant of left female breast: Secondary | ICD-10-CM | POA: Diagnosis not present

## 2017-07-27 DIAGNOSIS — Z803 Family history of malignant neoplasm of breast: Secondary | ICD-10-CM | POA: Insufficient documentation

## 2017-07-27 DIAGNOSIS — Z800 Family history of malignant neoplasm of digestive organs: Secondary | ICD-10-CM | POA: Diagnosis not present

## 2017-07-27 DIAGNOSIS — D333 Benign neoplasm of cranial nerves: Secondary | ICD-10-CM | POA: Diagnosis not present

## 2017-07-27 DIAGNOSIS — C50912 Malignant neoplasm of unspecified site of left female breast: Secondary | ICD-10-CM

## 2017-07-27 DIAGNOSIS — D0511 Intraductal carcinoma in situ of right breast: Secondary | ICD-10-CM

## 2017-07-27 LAB — CMP (CANCER CENTER ONLY)
ALBUMIN: 4.1 g/dL (ref 3.5–5.0)
ALT: 12 U/L (ref 0–55)
ANION GAP: 9 (ref 3–11)
AST: 16 U/L (ref 5–34)
Alkaline Phosphatase: 81 U/L (ref 40–150)
BILIRUBIN TOTAL: 0.6 mg/dL (ref 0.2–1.2)
BUN: 10 mg/dL (ref 7–26)
CO2: 26 mmol/L (ref 22–29)
Calcium: 9.8 mg/dL (ref 8.4–10.4)
Chloride: 106 mmol/L (ref 98–109)
Creatinine: 0.81 mg/dL (ref 0.60–1.10)
GFR, Estimated: >60 mL/min (ref >60)
GLUCOSE: 76 mg/dL (ref 70–140)
POTASSIUM: 4.5 mmol/L (ref 3.5–5.1)
SODIUM: 141 mmol/L (ref 136–145)
TOTAL PROTEIN: 7.4 g/dL (ref 6.4–8.3)

## 2017-07-27 LAB — CBC WITH DIFFERENTIAL (CANCER CENTER ONLY)
BASOS PCT: 0 %
Basophils Absolute: 0 10*3/uL (ref 0.0–0.1)
EOS ABS: 0.1 10*3/uL (ref 0.0–0.5)
Eosinophils Relative: 2 %
HCT: 42.4 % (ref 34.8–46.6)
Hemoglobin: 13.7 g/dL (ref 11.6–15.9)
LYMPHS ABS: 1.4 10*3/uL (ref 0.9–3.3)
Lymphocytes Relative: 26 %
MCH: 28.5 pg (ref 25.1–34.0)
MCHC: 32.3 g/dL (ref 31.5–36.0)
MCV: 88.3 fL (ref 79.5–101.0)
MONO ABS: 0.5 10*3/uL (ref 0.1–0.9)
MONOS PCT: 9 %
Neutro Abs: 3.6 10*3/uL (ref 1.5–6.5)
Neutrophils Relative %: 63 %
Platelet Count: 203 10*3/uL (ref 145–400)
RBC: 4.8 MIL/uL (ref 3.70–5.45)
RDW: 13.1 % (ref 11.2–14.5)
WBC Count: 5.6 10*3/uL (ref 3.9–10.3)

## 2017-07-27 MED ORDER — HYDROCODONE-ACETAMINOPHEN 5-325 MG PO TABS: 1.0000 | ORAL_TABLET | Freq: Four times a day (QID) | ORAL | 0 refills | Status: DC | PRN

## 2017-07-27 NOTE — Telephone Encounter (Signed)
Per Dr Jana Hakim, he would like any records 'path, summary, op note' regarding pt's breast surgery in 1996, Dr Nori Riis was her doctor at [facility].  Left VM for [facility] medical records to return our call.

## 2017-07-27 NOTE — Telephone Encounter (Signed)
Santiago Glad from [facility] returned my call and faxed pt's records pertaining to 1996 breast surgery. Gave those records to Dr Jana Hakim as requested.

## 2017-07-27 NOTE — Progress Notes (Signed)
REFERRING PROVIDER: Rolm Bookbinder, MD [address] [place], [place] [address]  PRIMARY PROVIDER:  Tisovec, Fransico Him, MD  PRIMARY REASON FOR VISIT:  1. Family history of breast cancer   2. Family history of prostate cancer   3. Family history of ovarian cancer   4. Family history of pancreatic cancer   5. History of colon polyps   6. Malignant neoplasm of upper-outer quadrant of left female breast, unspecified estrogen receptor status ([place])      HISTORY OF PRESENT ILLNESS:   Stephanie Bautista, a 67 y.o. female, was seen for a [place] cancer genetics consultation at the request of Dr. Donne Hazel due to a personal and family history of cancer.  Ms. Rump presents to clinic today, with her husband, to discuss the possibility of a hereditary predisposition to cancer, genetic testing, and to further clarify her future cancer risks, as well as potential cancer risks for family members.   In 1996, at the age of 69, Ms. Bhandari was diagnosed with invasive ductal carcinoma of the right breast. This was treated with lumpectomy and radiation. She has never had genetic testing. In 2013, Ms. Dupree had several skin biopsies, one of which was a neurofibroma.  In 2015 Ms. Fales started having left hearing loss.  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  More recently, another schwannoma was found in her pituitary gland.  She reports that they are watching it until it reaches a certain size and then will remove it.  In 2019, at the age of 63, Ms. Ijames was diagnosed with cancer of the left breast.      CANCER HISTORY:   No history exists.     HORMONAL RISK FACTORS:  Menarche was at age 29.  First live birth at age 67.  OCP use for approximately 7 years.  Ovaries intact: one.  Hysterectomy: yes.  Menopausal status: postmenopausal.  HRT use: 0 years. Colonoscopy: yes; 3 tubular adenomas. Mammogram within the last year: yes. Number of breast  biopsies: 3. Up to date with pelvic exams:  yes. Any excessive radiation exposure in the past:  no  Past Medical History:  Diagnosis Date  . Acoustic neuroma ([place])   . Adenomatous colon polyp 12/2002  . Arthritis   . Breast cancer ([place]) 1996   right breast cancer in 1996, left breast cancer in 2019  . Cholelithiases   . Esophageal stricture   . Family history of breast cancer   . Family history of ovarian cancer   . Family history of pancreatic cancer   . Family history of prostate cancer   . GERD (gastroesophageal reflux disease)   . Hemorrhoids   . Hiatal hernia   . History of colon polyps   . Inguinal hernia   . UTI (lower urinary tract infection)     Past Surgical History:  Procedure Laterality Date  . BREAST LUMPECTOMY    . CESAREAN SECTION     2x  . CHOLECYSTECTOMY  2008  . HEMORRHOID SURGERY  2007  . INGUINAL HERNIA REPAIR  1968  . PARTIAL HYSTERECTOMY  1996  . TONSILLECTOMY      Social History   Socioeconomic History  . Marital status: Married    Spouse name: Not on file  . Number of children: 2  . Years of education: Not on file  . Highest education level: Not on file  Social Needs  . Financial resource strain: Not on file  .  Food insecurity - worry: Not on file  . Food insecurity - inability: Not on file  . Transportation needs - medical: Not on file  . Transportation needs - non-medical: Not on file  Occupational History  . Occupation: Office manager: [employer]  Tobacco Use  . Smoking status: Never Smoker  . Smokeless tobacco: Never Used  Substance and Sexual Activity  . Alcohol use: No  . Drug use: No  . Sexual activity: Not on file  Other Topics Concern  . Not on file  Social History Narrative  . Not on file     FAMILY HISTORY:  We obtained a detailed, 4-generation family history.  Significant diagnoses are listed below: Family History  Problem Relation Age of Onset  . Heart disease Mother   . Melanoma Mother        dx in her  75s  . Ovarian cancer Mother 45  . Heart disease Father   . Diabetes Father   . Stroke Father   . Ovarian cancer Sister 65  . Pancreatic cancer Maternal Aunt   . Bone cancer Maternal Uncle   . Breast cancer Paternal Aunt   . Non-Hodgkin's lymphoma Maternal Grandmother   . Heart disease Maternal Grandfather   . Heart disease Paternal Grandmother   . Prostate cancer Paternal Grandfather   . Stroke Maternal Uncle   . Bone cancer Maternal Uncle   . Cancer Maternal Aunt        NOS  . Breast cancer Cousin        daughter of mat aunt with NOS cancer    The patient has one son and one daughter who are both cancer free.  She has three sisters, one who had ovarian cancer at 52.  Both parents are deceased.  The patient's mother had ovarian cancer around age 12, and melanoma in her 38's.  She had two full brothers and a full sister, and four paternal half siblings.  A full brother had bone cancer and a full sister had pancreatic cancer.  A half brother had bone cancer and a half sister had an unknown cancer.  This sister's daughter had breast cancer.  Both maternal grandparents are deceased.  The grandmother from non-Hodgkin's lymphoma and the grandfather from heart disease.  The patient's father died from heart disease.  He had 10 siblings, one sister had breast cancer.  The paternal grandparents are deceased.  The grandfather had prostate cancer.   Ms. Salido is aware of previous family history of genetic testing for hereditary cancer risks.  She reports that her niece may have had genetic testing, but does not know the results. Patient's ancestors are of [place] descent. There is no reported Ashkenazi Jewish ancestry. There is no known consanguinity.  GENETIC COUNSELING ASSESSMENT: Roxana B Shackett is a 66 y.o. female with a personal and family history of cancer which is somewhat suggestive of a hereditary cancer syndrome and predisposition to cancer. We, therefore, discussed and  recommended the following at today's visit.   DISCUSSION: We discussed that about 5-10% of breast cancer is hereditary with most cases due to BRCA mutations.  Other genes are associated with hereditary breast cancer syndromes including ATM, CHEK2 and PALB2.    We discussed her schwannomas and neurofibroma.  We discussed that there are two conditions NF1 and NF2 that can be associated with neurofibromas.  NF1 is associated with skin tags like neurofibromas, brown birth marks and sometimes and increased risk for breast cancer.  NF2 is associated with bilateral (rather than unilateral) vestibular schwannomas as well as CNS schwannomas.  Another condition, called Schwannomatosis can also have CNS schwannomas, but not usually vestibular ones.    We reviewed the characteristics, features and inheritance patterns of hereditary cancer syndromes. We also discussed genetic testing, including the appropriate family members to test, the process of testing, insurance coverage and turn-around-time for results. We discussed the implications of a negative, positive and/or variant of uncertain significant result. In order to get genetic test results in a timely manner so that Ms. Wandel can use these genetic test results for surgical decisions, we recommended Ms. Stonerock pursue genetic testing for the 9-gene STAT panel. If this test is negative, we then recommend Ms. Vanleer pursue reflex genetic testing to the Multi cancer gene panel. The genes on the STAT panel are also on the Multi-cancer panel.  The Multi-Gene Panel offered by [facility] includes sequencing and/or deletion duplication testing of the following 83 genes: ALK, APC, ATM, AXIN2,BAP1,  BARD1, BLM, BMPR1A, BRCA1, BRCA2, BRIP1, CASR, CDC73, CDH1, CDK4, CDKN1B, CDKN1C, CDKN2A (p14ARF), CDKN2A (p16INK4a), CEBPA, CHEK2, CTNNA1, DICER1, DIS3L2, EGFR (c.2369C>T, p.Thr790Met variant only), EPCAM (Deletion/duplication testing only), FH, FLCN, GATA2, GPC3, GREM1 (Promoter  region deletion/duplication testing only), HOXB13 (c.251G>A, p.Gly84Glu), HRAS, KIT, MAX, MEN1, MET, MITF (c.952G>A, p.Glu318Lys variant only), MLH1, MSH2, MSH3, MSH6, MUTYH, NBN, NF1, NF2, NTHL1, PALB2, PDGFRA, PHOX2B, PMS2, POLD1, POLE, POT1, PRKAR1A, PTCH1, PTEN, RAD50, RAD51C, RAD51D, RB1, RECQL4, RET, RUNX1, SDHAF2, SDHA (sequence changes only), SDHB, SDHC, SDHD, SMAD4, SMARCA4, SMARCB1, SMARCE1, STK11, SUFU, TERT, TERT, TMEM127, TP53, TSC1, TSC2, VHL, WRN and WT1.    Based on Ms. Teale's personal and family history of cancer, she meets medical criteria for genetic testing. Despite that she meets criteria, she may still have an out of pocket cost. We discussed that if her out of pocket cost for testing is over $100, the laboratory will call and confirm whether she wants to proceed with testing.  If the out of pocket cost of testing is less than $100 she will be billed by the genetic testing laboratory.   PLAN: After considering the risks, benefits, and limitations, Ms. Daversa  provided informed consent to pursue genetic testing and the blood sample was sent to [facility] for analysis of the STAT and Multi cancer panel. Results should be available within approximately 7-10 days for the STAT panel and an additional 1-2 weeks' time for the Multi cancer panel, at which point they will be disclosed by telephone to Ms. Liera, as will any additional recommendations warranted by these results. Ms. Defeo will receive a summary of her genetic counseling visit and a copy of her results once available. This information will also be available in Epic. We encouraged Ms. Meanor to remain in contact with cancer genetics annually so that we can continuously update the family history and inform her of any changes in cancer genetics and testing that may be of benefit for her family. Ms. Rockman questions were answered to her satisfaction today. Our contact information was provided should additional  questions or concerns arise.  Lastly, we encouraged Ms. Bear to remain in contact with cancer genetics annually so that we can continuously update the family history and inform her of any changes in cancer genetics and testing that may be of benefit for this family.   Ms.  Dewilde questions were answered to her satisfaction today. Our contact information was provided should additional questions or concerns arise. Thank you for the referral and  allowing [place] to share in the care of your patient.   Karen P. Florene Glen, [place], [facility] Certified Genetic Counselor [email or web address] phone: [phone]  The patient was seen for a total of 60 minutes in face-to-face genetic counseling.  This patient was discussed with Drs. Magrinat, Lindi Adie and/or Burr Medico who agrees with the above.    _______________________________________________________________________ For Office Staff:  Number of people involved in session: 2 Was an Intern/ student involved with case: no

## 2017-07-28 ENCOUNTER — Other Ambulatory Visit: Payer: Self-pay | Admitting: General Surgery

## 2017-07-28 ENCOUNTER — Ambulatory Visit
Admission: RE | Admit: 2017-07-28 | Discharge: 2017-07-28 | Disposition: A | Discharge status: Home / Self Care | Payer: Medicare Other | Source: Ambulatory Visit | Attending: General Surgery | Admitting: General Surgery

## 2017-07-28 ENCOUNTER — Telehealth: Payer: Self-pay | Admitting: Oncology

## 2017-07-28 DIAGNOSIS — C50411 Malignant neoplasm of upper-outer quadrant of right female breast: Secondary | ICD-10-CM

## 2017-07-28 DIAGNOSIS — N6320 Unspecified lump in the left breast, unspecified quadrant: Secondary | ICD-10-CM | POA: Diagnosis not present

## 2017-07-28 MED ORDER — GADOBENATE DIMEGLUMINE 529 MG/ML IV SOLN
11.0000 mL | Freq: Once | INTRAVENOUS | Status: AC | PRN
  Administered 2017-07-28: 11 mL via INTRAVENOUS

## 2017-07-28 NOTE — Telephone Encounter (Signed)
Called patient regarding 3/11 and 3/22. [facility] will call patient to schedule echo

## 2017-07-28 NOTE — Telephone Encounter (Signed)
Per 3/5 no los °

## 2017-07-29 DIAGNOSIS — Z853 Personal history of malignant neoplasm of breast: Secondary | ICD-10-CM | POA: Diagnosis not present

## 2017-07-29 DIAGNOSIS — N6321 Unspecified lump in the left breast, upper outer quadrant: Secondary | ICD-10-CM | POA: Diagnosis not present

## 2017-07-29 DIAGNOSIS — R599 Enlarged lymph nodes, unspecified: Secondary | ICD-10-CM | POA: Diagnosis not present

## 2017-08-02 ENCOUNTER — Encounter: Payer: Self-pay | Admitting: Oncology

## 2017-08-02 ENCOUNTER — Other Ambulatory Visit: Payer: Self-pay

## 2017-08-02 ENCOUNTER — Inpatient Hospital Stay: Payer: Medicare Other

## 2017-08-02 ENCOUNTER — Other Ambulatory Visit: Payer: Self-pay | Admitting: General Surgery

## 2017-08-02 ENCOUNTER — Encounter (HOSPITAL_BASED_OUTPATIENT_CLINIC_OR_DEPARTMENT_OTHER): Payer: Self-pay | Admitting: *Deleted

## 2017-08-03 ENCOUNTER — Ambulatory Visit (HOSPITAL_COMMUNITY): Payer: Medicare Other | Attending: Cardiology

## 2017-08-03 ENCOUNTER — Encounter: Payer: Self-pay | Admitting: Genetic Counselor

## 2017-08-03 ENCOUNTER — Telehealth: Payer: Self-pay | Admitting: Genetic Counselor

## 2017-08-03 ENCOUNTER — Other Ambulatory Visit: Payer: Self-pay

## 2017-08-03 ENCOUNTER — Ambulatory Visit: Payer: Self-pay | Admitting: Genetic Counselor

## 2017-08-03 DIAGNOSIS — D0511 Intraductal carcinoma in situ of right breast: Secondary | ICD-10-CM | POA: Diagnosis not present

## 2017-08-03 DIAGNOSIS — Z1379 Encounter for other screening for genetic and chromosomal anomalies: Secondary | ICD-10-CM | POA: Insufficient documentation

## 2017-08-03 DIAGNOSIS — C50412 Malignant neoplasm of upper-outer quadrant of left female breast: Secondary | ICD-10-CM

## 2017-08-03 DIAGNOSIS — H905 Unspecified sensorineural hearing loss: Secondary | ICD-10-CM | POA: Diagnosis not present

## 2017-08-03 DIAGNOSIS — D333 Benign neoplasm of cranial nerves: Secondary | ICD-10-CM

## 2017-08-03 DIAGNOSIS — Z171 Estrogen receptor negative status [ER-]: Secondary | ICD-10-CM | POA: Insufficient documentation

## 2017-08-03 DIAGNOSIS — Z8041 Family history of malignant neoplasm of ovary: Secondary | ICD-10-CM

## 2017-08-03 DIAGNOSIS — Z803 Family history of malignant neoplasm of breast: Secondary | ICD-10-CM

## 2017-08-03 DIAGNOSIS — Z8249 Family history of ischemic heart disease and other diseases of the circulatory system: Secondary | ICD-10-CM | POA: Diagnosis not present

## 2017-08-03 DIAGNOSIS — Z8042 Family history of malignant neoplasm of prostate: Secondary | ICD-10-CM

## 2017-08-03 DIAGNOSIS — Z800 Family history of malignant neoplasm of digestive organs: Secondary | ICD-10-CM

## 2017-08-03 NOTE — Progress Notes (Signed)
Ensure pre surgery drink given with instructions to complete by 0945 dos, pt verbalized understanding. 

## 2017-08-03 NOTE — Progress Notes (Signed)
HPI: Ms. Schmoker was previously seen in the [place] clinic due to a personal and family history of cancer and concerns regarding a hereditary predisposition to cancer. Please refer to our prior cancer genetics clinic note for more information regarding Ms. Challis's medical, social and family histories, and our assessment and recommendations, at the time. Ms. Gruel recent genetic test results were disclosed to her, as were recommendations warranted by these results. These results and recommendations are discussed in more detail below.  CANCER HISTORY:    Malignant neoplasm of upper-outer quadrant of left breast in female, estrogen receptor negative ([place])   10/07/2007 Initial Diagnosis    Malignant neoplasm of upper-outer quadrant of left breast in female, estrogen receptor negative ([place])      08/02/2017 Genetic Testing    WT1 c.332C>T VUS identified on the Multi-cancer gene panel.  The Multi-Gene Panel offered by [facility] includes sequencing and/or deletion duplication testing of the following 83 genes: ALK, APC, ATM, AXIN2,BAP1,  BARD1, BLM, BMPR1A, BRCA1, BRCA2, BRIP1, CASR, CDC73, CDH1, CDK4, CDKN1B, CDKN1C, CDKN2A (p14ARF), CDKN2A (p16INK4a), CEBPA, CHEK2, CTNNA1, DICER1, DIS3L2, EGFR (c.2369C>T, p.Thr790Met variant only), EPCAM (Deletion/duplication testing only), FH, FLCN, GATA2, GPC3, GREM1 (Promoter region deletion/duplication testing only), HOXB13 (c.251G>A, p.Gly84Glu), HRAS, KIT, MAX, MEN1, MET, MITF (c.952G>A, p.Glu318Lys variant only), MLH1, MSH2, MSH3, MSH6, MUTYH, NBN, NF1, NF2, NTHL1, PALB2, PDGFRA, PHOX2B, PMS2, POLD1, POLE, POT1, PRKAR1A, PTCH1, PTEN, RAD50, RAD51C, RAD51D, RB1, RECQL4, RET, RUNX1, SDHAF2, SDHA (sequence changes only), SDHB, SDHC, SDHD, SMAD4, SMARCA4, SMARCB1, SMARCE1, STK11, SUFU, TERT, TERT, TMEM127, TP53, TSC1, TSC2, VHL, WRN and WT1.  The report date is August 02, 2017.        FAMILY HISTORY:  We obtained a detailed, 4-generation family  history.  Significant diagnoses are listed below: Family History  Problem Relation Age of Onset  . Heart disease Mother   . Melanoma Mother        dx in her 89s  . Ovarian cancer Mother 16  . Heart disease Father   . Diabetes Father   . Stroke Father   . Ovarian cancer Sister 43  . Pancreatic cancer Maternal Aunt   . Bone cancer Maternal Uncle   . Breast cancer Paternal Aunt   . Non-Hodgkin's lymphoma Maternal Grandmother   . Heart disease Maternal Grandfather   . Heart disease Paternal Grandmother   . Prostate cancer Paternal Grandfather   . Stroke Maternal Uncle   . Bone cancer Maternal Uncle   . Cancer Maternal Aunt        NOS  . Breast cancer Cousin        daughter of mat aunt with NOS cancer    The patient has one son and one daughter who are both cancer free.  She has three sisters, one who had ovarian cancer at 51.  Both parents are deceased.  The patient's mother had ovarian cancer around age 82, and melanoma in her 67's.  She had two full brothers and a full sister, and four paternal half siblings.  A full brother had bone cancer and a full sister had pancreatic cancer.  A half brother had bone cancer and a half sister had an unknown cancer.  This sister's daughter had breast cancer.  Both maternal grandparents are deceased.  The grandmother from non-Hodgkin's lymphoma and the grandfather from heart disease.  The patient's father died from heart disease.  He had 10 siblings, one sister had breast cancer.  The paternal grandparents are deceased.  The grandfather  had prostate cancer.   Ms. Frese is aware of previous family history of genetic testing for hereditary cancer risks.  She reports that her niece may have had genetic testing, but does not know the results. Patient's ancestors are of [place] descent. There is no reported Ashkenazi Jewish ancestry. There is no known consanguinity.  GENETIC TEST RESULTS: Genetic testing reported out on August 02, 2017  through the Multi-cancer panel found no deleterious mutations.  The Multi-Gene Panel offered by [facility] includes sequencing and/or deletion duplication testing of the following 83 genes: ALK, APC, ATM, AXIN2,BAP1,  BARD1, BLM, BMPR1A, BRCA1, BRCA2, BRIP1, CASR, CDC73, CDH1, CDK4, CDKN1B, CDKN1C, CDKN2A (p14ARF), CDKN2A (p16INK4a), CEBPA, CHEK2, CTNNA1, DICER1, DIS3L2, EGFR (c.2369C>T, p.Thr790Met variant only), EPCAM (Deletion/duplication testing only), FH, FLCN, GATA2, GPC3, GREM1 (Promoter region deletion/duplication testing only), HOXB13 (c.251G>A, p.Gly84Glu), HRAS, KIT, MAX, MEN1, MET, MITF (c.952G>A, p.Glu318Lys variant only), MLH1, MSH2, MSH3, MSH6, MUTYH, NBN, NF1, NF2, NTHL1, PALB2, PDGFRA, PHOX2B, PMS2, POLD1, POLE, POT1, PRKAR1A, PTCH1, PTEN, RAD50, RAD51C, RAD51D, RB1, RECQL4, RET, RUNX1, SDHAF2, SDHA (sequence changes only), SDHB, SDHC, SDHD, SMAD4, SMARCA4, SMARCB1, SMARCE1, STK11, SUFU, TERT, TERT, TMEM127, TP53, TSC1, TSC2, VHL, WRN and WT1.  The test report has been scanned into EPIC and is located under the Molecular Pathology section of the Results Review tab.   We discussed with Ms. Carollo that since the current genetic testing is not perfect, it is possible there may be a gene mutation in one of these genes that current testing cannot detect, but that chance is small. We also discussed, that it is possible that another gene that has not yet been discovered, or that we have not yet tested, is responsible for the cancer diagnoses in the family, and it is, therefore, important to remain in touch with cancer genetics in the future so that we can continue to offer Ms. Koslowski the most up to date genetic testing.   Genetic testing did detect a Variant of Unknown Significance in the WT1 gene called c.332C>T (p.Pro111Leu). At this time, it is unknown if this variant is associated with increased cancer risk or if this is a normal finding, but most variants such as this get reclassified to being  inconsequential. It should not be used to make medical management decisions. With time, we suspect the lab will determine the significance of this variant, if any. If we do learn more about it, we will try to contact Ms. Canepa to discuss it further. However, it is important to stay in touch with [place] periodically and keep the address and phone number up to date.    CANCER SCREENING RECOMMENDATIONS: This result is reassuring and indicates that Ms. Schimming likely does not have an increased risk for a future cancer due to a mutation in one of these genes. This normal test also suggests that Ms. Dilling's cancer was most likely not due to an inherited predisposition associated with one of these genes.  Most cancers happen by chance and this negative test suggests that her cancer falls into this category.  We, therefore, recommended she continue to follow the cancer management and screening guidelines provided by her oncology and primary healthcare provider.   An individual's cancer risk and medical management are not determined by genetic test results alone. Overall cancer risk assessment incorporates additional factors, including personal medical history, family history, and any available genetic information that may result in a personalized plan for cancer prevention and surveillance.  RECOMMENDATIONS FOR FAMILY MEMBERS: Women in this family  might be at some increased risk of developing cancer, over the general population risk, simply due to the family history of cancer. We recommended women in this family have a yearly mammogram beginning at age 65, or 15 years younger than the earliest onset of cancer, an annual clinical breast exam, and perform monthly breast self-exams. Women in this family should also have a gynecological exam as recommended by their primary provider. All family members should have a colonoscopy by age 56.  FOLLOW-UP: Lastly, we discussed with Ms. Kain that cancer genetics is a  rapidly advancing field and it is possible that new genetic tests will be appropriate for her and/or her family members in the future. We encouraged her to remain in contact with cancer genetics on an annual basis so we can update her personal and family histories and let her know of advances in cancer genetics that may benefit this family.   Our contact number was provided. Ms. Guest questions were answered to her satisfaction, and she knows she is welcome to call us at anytime with additional questions or concerns.   Roma Kayser, MS, [facility] Certified Genetic Counselor [email or web address]

## 2017-08-03 NOTE — Telephone Encounter (Signed)
Revealed negative genetic testing.  Discussed that we do not know why she has breast cancer or why there is cancer in the family. It could be due to a different gene that we are not testing, or maybe our current technology may not be able to pick something up.  It will be important for her to keep in contact with genetics to keep up with whether additional testing may be needed.  Discussed that this is also negative for NF2 and Schwannomatosis.  Patient stated that she understood.  There is a VUS in WT1, a gene that is associated with something other than what we were concerned about.  This is still a normal result.

## 2017-08-04 NOTE — Progress Notes (Signed)
Stephanie Bautista  [phone] [phone]     ID: Stephanie Bautista DOB: Apr 14, 1951  MR#: [id number]  JYN#:[id number]  Patient Care Team: Haywood Pao, MD as PCP - General (Internal Medicine) Magrinat, Virgie Dad, MD as Consulting Physician (Oncology) Rolm Bookbinder, MD as Consulting Physician (General Surgery) Maisie Fus, MD as Consulting Physician (Obstetrics and Gynecology) Delrae Rend, MD as Consulting Physician (Endocrinology) Verner Chol, MD as Consulting Physician (Sports Medicine) Lavonia Dana, MD as Referring Physician (Otolaryngology) OTHER MD:  CHIEF COMPLAINT: Triple negative breast cancer  CURRENT TREATMENT: Neoadjuvant chemotherapy   HISTORY OF CURRENT ILLNESS: From the original intake note:  I saw Stephanie Bautista remotely for her history of ductal carcinoma in situ of the right breast, treated with lumpectomy 09/02/1994, with the pathology showing ([id number]) ductal carcinoma in situ.  There was no invasive component.  This was followed by radiation given between 09/21/94 and 11/04/94, with a total of 86578 centigray to the right breast and a total of 604 0 cGy to the tumor bed.  She notes that she didn't take any anti-estrogen therapy at that time.  More recently, Stephanie Bautista had routine screening mammography at [facility] on 07/16/2017 showing a possible abnormality in the left breast.  The breast density was category B.  She underwent left breast ultrasound on 07/16/2017 showing: a 1.5 cm irregular mass in the left breast upper outer quadrant posterior depth is consistent with carcinoma. A 6 mm irregular mass in the left breast upper outer quadrant posterior depth was also highly suggestive for malignancy. A lymph node in the left axillary tail was read as suspicious of malignancy.   Accordingly on 07/22/2017 she proceeded to biopsy of the left breast area in question as well as the suspicious left axillary lymph node. The pathology from this  procedure showed ([id number]): Lymph node, needle/core biopsy, left axilla with no evidence of carcinoma-- concordant. Breast, left, needle core biopsy, invasive ductal carcinoma, grade 3, prognostic indicators significant for: ER, 0% negative and PR, 0% negative. Proliferation marker Ki67 at 50%. HER2 negative signals ratio is 1.36, and the number per cell 1.70 .Breast, left, needle core biopsy, architectural distortion consistent with fibroadenoma--discordant.  She has a bilateral breast MRI scheduled for 07/28/2017.  The patient's subsequent history is as detailed below.  INTERVAL HISTORY: Stephanie Bautista returns today for a follow-up and treatment of her triple negative breast cancer. She is accompanied by her husband.  After her last visit here she proceeded to bilateral breast MRI, on 07/28/2017.  This confirmed the breast density to be B.  The right breast was unremarkable.  In the left breast upper outer quadrant there was an enhancing mass measuring 2.4 cm.  There were no other findings of concern.  Her case was discussed at the multidisciplinary breast cancer conference on 07/28/2017.  At that point neoadjuvant chemotherapy was suggested as well as genetics testing.  The genetics has since been done and was negative.  She also had a baseline echocardiogram on 08/03/2017 showing an ejection fraction in the 60-65% range.  In preparation for her chemotherapy the patient had a port placed on 08/05/2017.  She is here today to discuss the upcoming treatment   REVIEW OF SYSTEMS: Stephanie Bautista is doing well. However, she has been experiencing an aching sensation to the right side of her body after having her port placed. She notes that her symptoms have started to improve. Stephanie Bautista continues to have pain to her right wrist and is wondering if a cortisone  shot would help. She takes Advil and tylenol for her pain, but adds they do not help. She endorses a shooting pain to her right breast at times. She denies unusual headaches,  visual changes, nausea, vomiting, or dizziness. There has been no unusual cough, phlegm production, or pleurisy. This been no change in bowel or bladder habits. She denies unexplained fatigue or unexplained weight loss, bleeding, rash, or fever. A detailed review of systems was otherwise noncontributory.    PAST MEDICAL HISTORY: Past Medical History:  Diagnosis Date  . Acoustic neuroma ([place])   . Adenomatous colon polyp 12/2002  . Arthritis   . Breast cancer ([place]) 1996   right breast cancer in 1996, left breast cancer in 2019  . Cholelithiases   . Esophageal stricture   . Family history of breast cancer   . Family history of ovarian cancer   . Family history of pancreatic cancer   . Family history of prostate cancer   . GERD (gastroesophageal reflux disease)   . Hemorrhoids   . Hiatal hernia   . History of colon polyps   . Inguinal hernia   . UTI (lower urinary tract infection)   As far as PMHx she has had intermittent back pain (spinal stenosis) x 2 years,  Osteoporosis, random migraines, early cataracts and early hear loss due to schwannoma.   PAST SURGICAL HISTORY: Past Surgical History:  Procedure Laterality Date  . BREAST LUMPECTOMY    . CESAREAN SECTION     2x  . CHOLECYSTECTOMY  2008  . HEMORRHOID SURGERY  2007  . INGUINAL HERNIA REPAIR  1968  . PARTIAL HYSTERECTOMY  1996  . PORTACATH PLACEMENT Right 08/05/2017   Procedure: INSERTION PORT-A-CATH WITH [place];  Surgeon: Rolm Bookbinder, MD;  Location: [place];  Service: General;  Laterality: Right;  . TONSILLECTOMY      FAMILY HISTORY Family History  Problem Relation Age of Onset  . Heart disease Mother   . Melanoma Mother        dx in her 4s  . Ovarian cancer Mother 36  . Heart disease Father   . Diabetes Father   . Stroke Father   . Ovarian cancer Sister 26  . Pancreatic cancer Maternal Aunt   . Bone cancer Maternal Uncle   . Breast cancer Paternal Aunt   . Non-Hodgkin's lymphoma Maternal  Grandmother   . Heart disease Maternal Grandfather   . Heart disease Paternal Grandmother   . Prostate cancer Paternal Grandfather   . Stroke Maternal Uncle   . Bone cancer Maternal Uncle   . Cancer Maternal Aunt        NOS  . Breast cancer Cousin        daughter of mat aunt with NOS cancer  Her mother died from CHF at age 66. Her father died from cardiac issues at age 97. She doesn't have any brothers, but has 3 sisters. She had one sister and her mother with ovarian cancer. Her sister was age 103 when she was diagnosed with ovarian cancer and her mother was in her late 16's when she was diagnosed with ovarian cancer. Her sister was not tested genetically following this. Her maternal grandmother had non-hodgkins lymphoma. Her paternal grandfather had prostate cancer. Her paternal aunt had breast cancer. Her paternal aunt's daughter had breast cancer. She had several uncles that had prostate cancer that spread to the bones.   GYNECOLOGIC HISTORY:  No LMP recorded. Patient has had a hysterectomy. Menarche: 67 years old  Age at first live birth: 67 years old [place] P2 LMP: hysterectomy at age 54 S/p Hysterectomy with USO (L)  SOCIAL HISTORY: She has been retired from Medical sales representative work since 05/26/2017. Her husband, Stephanie Bautista has been retired from the [employer] for the past 9 years. Their son is Stephanie Bautista is 12 and a IT trainer. Their daughter, Stephanie Bautista lives in [place] [facility], is 97 and works in child care at [employer]. The patient has 6 grandchildren ( 2 boys and 4 girls). She is a Psychologist, forensic.      ADVANCED DIRECTIVES:    HEALTH MAINTENANCE: Social History   Tobacco Use  . Smoking status: Never Smoker  . Smokeless tobacco: Never Used  Substance Use Topics  . Alcohol use: No  . Drug use: No     Colonoscopy: Dr. Fuller Plan.   PAP: Dr Nori Riis  Bone density: at Dr. Verlon Au office/ osteoporosis.    Allergies  Allergen Reactions  . Amoxicillin Itching and Rash    Current Outpatient  Medications  Medication Sig Dispense Refill  . acetaminophen (TYLENOL) 500 MG tablet Take 500 mg by mouth every 6 (six) hours as needed.    Marland Kitchen alum & mag hydroxide-simeth (MAALOX/MYLANTA) 200-200-20 MG/5ML suspension Take by mouth every 6 (six) hours as needed for indigestion or heartburn.    . baclofen (LIORESAL) 10 MG tablet Take 10 mg by mouth as needed for muscle spasms.    Marland Kitchen omeprazole (PRILOSEC) 40 MG capsule Take 1 capsule (40 mg total) by mouth 2 (two) times daily. 60 capsule 11   No current facility-administered medications for this visit.     OBJECTIVE: Middle-aged white woman in no acute distress  Vitals:   08/13/17 1141  BP: (!) 155/76  Pulse: 72  Resp: 18  Temp: (!) 97.4 F (36.3 C)  SpO2: 100%     Body mass index is 25.4 kg/m.   Wt Readings from Last 3 Encounters:  08/13/17 143 lb 6.4 oz (65 kg)  08/05/17 142 lb 6 oz (64.6 kg)  07/27/17 141 lb 9.6 oz (64.2 kg)      ECOG FS:1 - Symptomatic but completely ambulatory  Sclerae unicteric, EOMs intact Oropharynx clear and moist No cervical or supraclavicular adenopathy Lungs no rales or rhonchi Heart regular rate and rhythm Abd soft, nontender, positive bowel sounds MSK no focal spinal tenderness, no upper extremity lymphedema Neuro: nonfocal, well oriented, appropriate affect Breasts: The right breast is status post remote lumpectomy for DCIS.  It is smaller than the left.  I do not palpate any abnormality there or in the axilla that might explain some of the patient's recent symptoms.  The left breast is status post recent biopsy complicated by hematoma.  That has largely resolved.  Left axilla is benign.    LAB RESULTS:  CMP     Component Value Date/Time   NA 141 07/27/2017 1007   K 4.5 07/27/2017 1007   CL 106 07/27/2017 1007   CO2 26 07/27/2017 1007   GLUCOSE 76 07/27/2017 1007   BUN 10 07/27/2017 1007   CREATININE 0.81 07/27/2017 1007   CREATININE 0.69 11/16/2011 1054   CALCIUM 9.8 07/27/2017 1007     PROT 7.4 07/27/2017 1007   ALBUMIN 4.1 07/27/2017 1007   AST 16 07/27/2017 1007   ALT 12 07/27/2017 1007   ALKPHOS 81 07/27/2017 1007   BILITOT 0.6 07/27/2017 1007   GFRNONAA >60 07/27/2017 1007   GFRAA >60 07/27/2017 1007    No results found for: TOTALPROTELP, ALBUMINELP,  A1GS, A2GS, BETS, BETA2SER, GAMS, MSPIKE, SPEI  No results found for: KPAFRELGTCHN, LAMBDASER, [facility]  Lab Results  Component Value Date   WBC 5.6 07/27/2017   NEUTROABS 3.6 07/27/2017   HGB 14.3 11/16/2011   HCT 42.4 07/27/2017   MCV 88.3 07/27/2017   PLT 203 07/27/2017    _0 @  No results found for: LABCA2  No components found for: NUUVOZ366  No results for input(s): INR in the last 168 hours.  No results found for: LABCA2  No results found for: YQI347  No results found for: QQV956  No results found for: LOV564  No results found for: CA2729  No components found for: HGQUANT  No results found for: CEA1 / No results found for: CEA1   No results found for: AFPTUMOR  No results found for: CHROMOGRNA  No results found for: PSA1  No visits with results within 3 Day(s) from this visit.  Latest known visit with results is:  Appointment on 07/27/2017  Component Date Value Ref Range Status  . WBC Count 07/27/2017 5.6  3.9 - 10.3 K/uL Final  . RBC 07/27/2017 4.80  3.70 - 5.45 MIL/uL Final  . Hemoglobin 07/27/2017 13.7  11.6 - 15.9 g/dL Final  . HCT 07/27/2017 42.4  34.8 - 46.6 % Final  . MCV 07/27/2017 88.3  79.5 - 101.0 fL Final  . MCH 07/27/2017 28.5  25.1 - 34.0 pg Final  . MCHC 07/27/2017 32.3  31.5 - 36.0 g/dL Final  . RDW 07/27/2017 13.1  11.2 - 14.5 % Final  . Platelet Count 07/27/2017 203  145 - 400 K/uL Final  . Neutrophils Relative % 07/27/2017 63  % Final  . Neutro Abs 07/27/2017 3.6  1.5 - 6.5 K/uL Final  . Lymphocytes Relative 07/27/2017 26  % Final  . Lymphs Abs 07/27/2017 1.4  0.9 - 3.3 K/uL Final  . Monocytes Relative 07/27/2017 9  % Final  . Monocytes  Absolute 07/27/2017 0.5  0.1 - 0.9 K/uL Final  . Eosinophils Relative 07/27/2017 2  % Final  . Eosinophils Absolute 07/27/2017 0.1  0.0 - 0.5 K/uL Final  . Basophils Relative 07/27/2017 0  % Final  . Basophils Absolute 07/27/2017 0.0  0.0 - 0.1 K/uL Final   Performed at [facility], [place] [address] [place], [place] [address]  . Sodium 07/27/2017 141  136 - 145 mmol/L Final  . Potassium 07/27/2017 4.5  3.5 - 5.1 mmol/L Final  . Chloride 07/27/2017 106  98 - 109 mmol/L Final  . CO2 07/27/2017 26  22 - 29 mmol/L Final  . Glucose, Bld 07/27/2017 76  70 - 140 mg/dL Final  . BUN 07/27/2017 10  7 - 26 mg/dL Final  . Creatinine 07/27/2017 0.81  0.60 - 1.10 mg/dL Final  . Calcium 07/27/2017 9.8  8.4 - 10.4 mg/dL Final  . Total Protein 07/27/2017 7.4  6.4 - 8.3 g/dL Final  . Albumin 07/27/2017 4.1  3.5 - 5.0 g/dL Final  . AST 07/27/2017 16  5 - 34 U/L Final  . ALT 07/27/2017 12  0 - 55 U/L Final  . Alkaline Phosphatase 07/27/2017 81  40 - 150 U/L Final  . Total Bilirubin 07/27/2017 0.6  0.2 - 1.2 mg/dL Final  . GFR, Est Non Af Am 07/27/2017 >60  >60 mL/min Final  . GFR, Est AFR Am 07/27/2017 >60  >60 mL/min Final   Comment: (NOTE) The eGFR has been calculated using the CKD EPI equation. This calculation has not  been validated in all clinical situations. eGFR's persistently <60 mL/min signify possible Chronic Kidney Disease.   Stephanie Bautista 07/27/2017 9  3 - 11 Final   Performed at [facility], [place] [address] [place], [place] [address]    (this displays the last labs from the last 3 days)  No results found for: TOTALPROTELP, ALBUMINELP, A1GS, A2GS, BETS, BETA2SER, GAMS, MSPIKE, SPEI (this displays SPEP labs)  No results found for: KPAFRELGTCHN, LAMBDASER, KAPLAMBRATIO (kappa/lambda light chains)  No results found for: HGBA, HGBA2QUANT, HGBFQUANT, HGBSQUAN (Hemoglobinopathy evaluation)   No results found for: LDH  No results found  for: IRON, TIBC, IRONPCTSAT (Iron and TIBC)  No results found for: FERRITIN  Urinalysis    Component Value Date/Time   BILIRUBINUR neg 04/06/2014 1019   PROTEINUR neg 04/06/2014 1019   UROBILINOGEN 0.2 04/06/2014 1019   NITRITE neg 04/06/2014 1019   LEUKOCYTESUR Negative 04/06/2014 1019     STUDIES: We reviewed the MRI results and the patient was able to take a photo of the images  ELIGIBLE FOR AVAILABLE RESEARCH PROTOCOL: UPBEAT  ASSESSMENT: 67 y.o. [place] woman  (1) status post right lumpectomy 09/02/1994 for ductal carcinoma in situ  (a) status post adjuvant radiation 09/21/1994 through 11/04/1994, total 5040 centigrade to the breast and 604 0 cGy to the tumor bed  (b) did not receive adjuvant antiestrogens  (2) status post left breast upper outer quadrant biopsy 07/22/2017 for a clinical T1c pN0, stage IB invasive ductal carcinoma, grade 3, triple negative, with an MIB-1 of 50%.  (a) lymph node biopsy on the same day was negative for malignancy (concordant)  (b) a second area of architectural distortion was also biopsied, read as fibroadenoma (discordant)  (c) a third area of architectural distortion has not yet been biopsied  (d) breast MRI scheduled for 07/28/2017 shows a clinical T2 N0 tumor  (3) genetics testing 08/02/2017 through the Multi-Gene Panel offered by [facility] found no deleterious mutations in ALK, APC, ATM, AXIN2,BAP1,  BARD1, BLM, BMPR1A, BRCA1, BRCA2, BRIP1, CASR, CDC73, CDH1, CDK4, CDKN1B, CDKN1C, CDKN2A (p14ARF), CDKN2A (p16INK4a), CEBPA, CHEK2, CTNNA1, DICER1, DIS3L2, EGFR (c.2369C>T, p.Thr790Met variant only), EPCAM (Deletion/duplication testing only), FH, FLCN, GATA2, GPC3, GREM1 (Promoter region deletion/duplication testing only), HOXB13 (c.251G>A, p.Gly84Glu), HRAS, KIT, MAX, MEN1, MET, MITF (c.952G>A, p.Glu318Lys variant only), MLH1, MSH2, MSH3, MSH6, MUTYH, NBN, NF1, NF2, NTHL1, PALB2, PDGFRA, PHOX2B, PMS2, POLD1, POLE, POT1, PRKAR1A, PTCH1, PTEN,  RAD50, RAD51C, RAD51D, RB1, RECQL4, RET, RUNX1, SDHAF2, SDHA (sequence changes only), SDHB, SDHC, SDHD, SMAD4, SMARCA4, SMARCB1, SMARCE1, STK11, SUFU, TERT, TERT, TMEM127, TP53, TSC1, TSC2, VHL, WRN and WT1.  The report date is August 02, 2017.  (a) WT1 c.332C>T VUS identified on the Multi-cancer gene panel.    (4) chemotherapy will consist of doxorubicin/ cyclophosphamide in dose dense fashion x4 starting 08/24/2017 followed by paclitaxel/ carboplatin weekly x12  (5) definitive surgery to follow  (6) adjuvant radiation to follow as appropriate  PLAN: I spent approximately 40 minutes with Stephanie Bautista and her husband today with most of that time spent discussing her upcoming treatment.  We reviewed results of the MRI, which are favorable, and also her excellent echocardiogram result.  We then discussed neoadjuvant chemotherapy and she understands that the survival is the same whether chemotherapy is given before or after surgery.  In her case we are going with neoadjuvant treatment in the hope that she will obtain a complete pathologic response, which would signal a very good long-term prognosis for her.  She will start  with cyclophosphamide and doxorubicin in dose dense fashion.  She would like to start on 08/24/2017. To expedite things her idea is to come the afternoon before treatment to get her labs and also to get accessed, leave the port accessed overnight and then the next day she is ready to walk into treatment.  The only thing that will delay her treatment then will be mixing the chemo drugs.  This is an interesting proposal and we will try to get that accomplished.  We then reviewed the possible toxicity side effects and complications of cyclophosphamide and doxorubicin.  Of course she did learn about this in our "chemo class" but we went over it today again.  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  Today I gave her a copy of the "roadmap" on how to take her supportive meds and I placed all the prescriptions in for her  I am going to see her the week after treatment to make sure she did not have any unusual side effects.  I did ask her to keep a diary so that we can best troubleshoot problems.  In addition she requested meeting with that nutritionist which I will be glad to try to arrange for  She knows to call for any issues that may develop before her next visit.   Magrinat, Virgie Dad, MD  08/13/17 11:54 AM Medical Oncology and Hematology [facility] [address] [place], [place] [address] Tel. [phone]    Fax. [phone]    This document serves as a record of services personally performed by Chauncey Cruel, MD. It was created on his behalf by Margit Banda, a trained medical scribe. The creation of this record is based on the scribe's personal observations and the provider's statements to them.   I have reviewed the above documentation for accuracy and completeness, and I agree with the above.

## 2017-08-05 ENCOUNTER — Encounter (HOSPITAL_BASED_OUTPATIENT_CLINIC_OR_DEPARTMENT_OTHER): Payer: Self-pay

## 2017-08-05 ENCOUNTER — Ambulatory Visit (HOSPITAL_BASED_OUTPATIENT_CLINIC_OR_DEPARTMENT_OTHER)
Admission: RE | Admit: 2017-08-05 | Discharge: 2017-08-05 | Disposition: A | Discharge status: Home / Self Care | Payer: Medicare Other | Source: Ambulatory Visit | Attending: General Surgery | Admitting: General Surgery

## 2017-08-05 ENCOUNTER — Encounter (HOSPITAL_BASED_OUTPATIENT_CLINIC_OR_DEPARTMENT_OTHER)
Admission: RE | Disposition: A | Discharge status: Home / Self Care | Payer: Self-pay | Source: Ambulatory Visit | Attending: General Surgery

## 2017-08-05 ENCOUNTER — Ambulatory Visit (HOSPITAL_BASED_OUTPATIENT_CLINIC_OR_DEPARTMENT_OTHER): Payer: Medicare Other | Admitting: Anesthesiology

## 2017-08-05 ENCOUNTER — Other Ambulatory Visit: Payer: Self-pay

## 2017-08-05 ENCOUNTER — Ambulatory Visit (HOSPITAL_COMMUNITY): Payer: Medicare Other

## 2017-08-05 DIAGNOSIS — Z923 Personal history of irradiation: Secondary | ICD-10-CM | POA: Insufficient documentation

## 2017-08-05 DIAGNOSIS — Z452 Encounter for adjustment and management of vascular access device: Secondary | ICD-10-CM | POA: Diagnosis not present

## 2017-08-05 DIAGNOSIS — K219 Gastro-esophageal reflux disease without esophagitis: Secondary | ICD-10-CM | POA: Diagnosis not present

## 2017-08-05 DIAGNOSIS — Z8041 Family history of malignant neoplasm of ovary: Secondary | ICD-10-CM | POA: Diagnosis not present

## 2017-08-05 DIAGNOSIS — C50412 Malignant neoplasm of upper-outer quadrant of left female breast: Secondary | ICD-10-CM | POA: Diagnosis not present

## 2017-08-05 DIAGNOSIS — Z419 Encounter for procedure for purposes other than remedying health state, unspecified: Secondary | ICD-10-CM

## 2017-08-05 DIAGNOSIS — D0511 Intraductal carcinoma in situ of right breast: Secondary | ICD-10-CM | POA: Diagnosis not present

## 2017-08-05 DIAGNOSIS — Z95828 Presence of other vascular implants and grafts: Secondary | ICD-10-CM

## 2017-08-05 HISTORY — PX: PORTACATH PLACEMENT: SHX2246

## 2017-08-05 SURGERY — INSERTION, TUNNELED CENTRAL VENOUS DEVICE, WITH PORT
Anesthesia: General | Site: Chest | Laterality: Right

## 2017-08-05 MED ORDER — EPHEDRINE 5 MG/ML INJ
INTRAVENOUS | Status: AC
  Filled 2017-08-05: qty 10

## 2017-08-05 MED ORDER — BUPIVACAINE HCL (PF) 0.25 % IJ SOLN
INTRAMUSCULAR | Status: DC | PRN
  Administered 2017-08-05: 9 mL

## 2017-08-05 MED ORDER — HEPARIN SOD (PORK) LOCK FLUSH 100 UNIT/ML IV SOLN
INTRAVENOUS | Status: DC | PRN
  Administered 2017-08-05: 500 [IU] via INTRAVENOUS

## 2017-08-05 MED ORDER — CELECOXIB 400 MG PO CAPS
400.0000 mg | ORAL_CAPSULE | ORAL | Status: AC
  Administered 2017-08-05: 200 mg via ORAL

## 2017-08-05 MED ORDER — HEPARIN (PORCINE) IN NACL 2-0.9 UNIT/ML-% IJ SOLN
INTRAMUSCULAR | Status: AC
  Filled 2017-08-05: qty 500

## 2017-08-05 MED ORDER — OXYCODONE HCL 5 MG PO TABS
ORAL_TABLET | ORAL | Status: AC
  Filled 2017-08-05: qty 1

## 2017-08-05 MED ORDER — ONDANSETRON HCL 4 MG/2ML IJ SOLN
INTRAMUSCULAR | Status: AC
  Filled 2017-08-05: qty 2

## 2017-08-05 MED ORDER — LIDOCAINE HCL (CARDIAC) 20 MG/ML IV SOLN
INTRAVENOUS | Status: AC
  Filled 2017-08-05: qty 5

## 2017-08-05 MED ORDER — OXYCODONE HCL 5 MG PO TABS
5.0000 mg | ORAL_TABLET | Freq: Once | ORAL | Status: AC | PRN
  Administered 2017-08-05: 5 mg via ORAL

## 2017-08-05 MED ORDER — FENTANYL CITRATE (PF) 100 MCG/2ML IJ SOLN
INTRAMUSCULAR | Status: AC
  Filled 2017-08-05: qty 2

## 2017-08-05 MED ORDER — EPHEDRINE SULFATE 50 MG/ML IJ SOLN
INTRAMUSCULAR | Status: DC | PRN
  Administered 2017-08-05: 10 mg via INTRAVENOUS

## 2017-08-05 MED ORDER — ACETAMINOPHEN 500 MG PO TABS
ORAL_TABLET | ORAL | Status: AC
  Filled 2017-08-05: qty 2

## 2017-08-05 MED ORDER — PROMETHAZINE HCL 25 MG/ML IJ SOLN: 6.2500 mg | INTRAMUSCULAR | Status: DC | PRN

## 2017-08-05 MED ORDER — MEPERIDINE HCL 25 MG/ML IJ SOLN: 6.2500 mg | INTRAMUSCULAR | Status: DC | PRN

## 2017-08-05 MED ORDER — GABAPENTIN 300 MG PO CAPS
300.0000 mg | ORAL_CAPSULE | ORAL | Status: AC
  Administered 2017-08-05: 300 mg via ORAL

## 2017-08-05 MED ORDER — LIDOCAINE 2% (20 MG/ML) 5 ML SYRINGE
INTRAMUSCULAR | Status: DC | PRN
  Administered 2017-08-05: 40 mg via INTRAVENOUS

## 2017-08-05 MED ORDER — CELECOXIB 200 MG PO CAPS: 200.0000 mg | ORAL_CAPSULE | ORAL | Status: DC

## 2017-08-05 MED ORDER — CELECOXIB 200 MG PO CAPS
ORAL_CAPSULE | ORAL | Status: AC
  Filled 2017-08-05: qty 2

## 2017-08-05 MED ORDER — FENTANYL CITRATE (PF) 100 MCG/2ML IJ SOLN
25.0000 ug | INTRAMUSCULAR | Status: DC | PRN
  Administered 2017-08-05: 25 ug via INTRAVENOUS

## 2017-08-05 MED ORDER — DEXAMETHASONE SODIUM PHOSPHATE 4 MG/ML IJ SOLN
INTRAMUSCULAR | Status: DC | PRN
  Administered 2017-08-05: 10 mg via INTRAVENOUS

## 2017-08-05 MED ORDER — CIPROFLOXACIN IN D5W 400 MG/200ML IV SOLN
400.0000 mg | INTRAVENOUS | Status: AC
  Administered 2017-08-05: 400 mg via INTRAVENOUS

## 2017-08-05 MED ORDER — HEPARIN SOD (PORK) LOCK FLUSH 100 UNIT/ML IV SOLN
INTRAVENOUS | Status: AC
  Filled 2017-08-05: qty 5

## 2017-08-05 MED ORDER — DEXAMETHASONE SODIUM PHOSPHATE 10 MG/ML IJ SOLN
INTRAMUSCULAR | Status: AC
  Filled 2017-08-05: qty 1

## 2017-08-05 MED ORDER — GABAPENTIN 300 MG PO CAPS
ORAL_CAPSULE | ORAL | Status: AC
  Filled 2017-08-05: qty 1

## 2017-08-05 MED ORDER — CIPROFLOXACIN IN D5W 400 MG/200ML IV SOLN
INTRAVENOUS | Status: AC
  Filled 2017-08-05: qty 200

## 2017-08-05 MED ORDER — KETOROLAC TROMETHAMINE 30 MG/ML IJ SOLN
INTRAMUSCULAR | Status: AC
  Filled 2017-08-05: qty 1

## 2017-08-05 MED ORDER — ENSURE PRE-SURGERY PO LIQD: 592.0000 mL | Freq: Once | ORAL | Status: DC

## 2017-08-05 MED ORDER — MIDAZOLAM HCL 2 MG/2ML IJ SOLN
1.0000 mg | INTRAMUSCULAR | Status: DC | PRN
  Administered 2017-08-05: 2 mg via INTRAVENOUS

## 2017-08-05 MED ORDER — KETOROLAC TROMETHAMINE 30 MG/ML IJ SOLN
INTRAMUSCULAR | Status: DC | PRN
  Administered 2017-08-05: 30 mg via INTRAVENOUS

## 2017-08-05 MED ORDER — PROPOFOL 10 MG/ML IV BOLUS
INTRAVENOUS | Status: DC | PRN
  Administered 2017-08-05: 150 mg via INTRAVENOUS

## 2017-08-05 MED ORDER — HEPARIN (PORCINE) IN NACL 2-0.9 UNIT/ML-% IJ SOLN
INTRAMUSCULAR | Status: AC | PRN
  Administered 2017-08-05: 1 via INTRAVENOUS

## 2017-08-05 MED ORDER — ONDANSETRON HCL 4 MG/2ML IJ SOLN
INTRAMUSCULAR | Status: DC | PRN
  Administered 2017-08-05: 4 mg via INTRAVENOUS

## 2017-08-05 MED ORDER — SCOPOLAMINE 1 MG/3DAYS TD PT72: 1.0000 | MEDICATED_PATCH | Freq: Once | TRANSDERMAL | Status: DC | PRN

## 2017-08-05 MED ORDER — OXYCODONE HCL 5 MG/5ML PO SOLN: 5.0000 mg | Freq: Once | ORAL | Status: AC | PRN

## 2017-08-05 MED ORDER — LACTATED RINGERS IV SOLN
INTRAVENOUS | Status: DC
  Administered 2017-08-05: 13:00:00 via INTRAVENOUS

## 2017-08-05 MED ORDER — ACETAMINOPHEN 500 MG PO TABS
1000.0000 mg | ORAL_TABLET | ORAL | Status: AC
  Administered 2017-08-05: 1000 mg via ORAL

## 2017-08-05 MED ORDER — MIDAZOLAM HCL 2 MG/2ML IJ SOLN
INTRAMUSCULAR | Status: AC
Start: 2017-08-05 — End: 2017-08-05
  Filled 2017-08-05: qty 2

## 2017-08-05 MED ORDER — FENTANYL CITRATE (PF) 100 MCG/2ML IJ SOLN
50.0000 ug | INTRAMUSCULAR | Status: DC | PRN
  Administered 2017-08-05: 50 ug via INTRAVENOUS

## 2017-08-05 SURGICAL SUPPLY — 53 items
BAG DECANTER FOR FLEXI CONT (MISCELLANEOUS) ×3 IMPLANT
BENZOIN TINCTURE PRP APPL 2/3 (GAUZE/BANDAGES/DRESSINGS) ×3 IMPLANT
BLADE SURG 11 STRL SS (BLADE) ×3 IMPLANT
BLADE SURG 15 STRL LF DISP TIS (BLADE) ×1 IMPLANT
BLADE SURG 15 STRL SS (BLADE) ×2
CANISTER SUCT 1200ML W/VALVE (MISCELLANEOUS) IMPLANT
CHLORAPREP W/TINT 26ML (MISCELLANEOUS) ×3 IMPLANT
CLOSURE WOUND 1/2 X4 (GAUZE/BANDAGES/DRESSINGS) ×1
COVER BACK TABLE 60X90IN (DRAPES) ×3 IMPLANT
COVER MAYO STAND STRL (DRAPES) ×3 IMPLANT
COVER PROBE 5X48 (MISCELLANEOUS)
DECANTER SPIKE VIAL GLASS SM (MISCELLANEOUS) IMPLANT
DERMABOND ADVANCED (GAUZE/BANDAGES/DRESSINGS) ×2
DERMABOND ADVANCED .7 DNX12 (GAUZE/BANDAGES/DRESSINGS) ×1 IMPLANT
DRAPE C-ARM 42X72 X-RAY (DRAPES) ×3 IMPLANT
DRAPE LAPAROSCOPIC ABDOMINAL (DRAPES) ×3 IMPLANT
DRAPE UTILITY XL STRL (DRAPES) ×3 IMPLANT
DRSG TEGADERM 4X4.75 (GAUZE/BANDAGES/DRESSINGS) IMPLANT
ELECT COATED BLADE 2.86 ST (ELECTRODE) ×3 IMPLANT
ELECT REM PT RETURN 9FT ADLT (ELECTROSURGICAL) ×3
ELECTRODE REM PT RTRN 9FT ADLT (ELECTROSURGICAL) ×1 IMPLANT
GAUZE SPONGE 4X4 12PLY STRL LF (GAUZE/BANDAGES/DRESSINGS) ×3 IMPLANT
GLOVE BIO SURGEON STRL SZ7 (GLOVE) ×6 IMPLANT
GLOVE BIOGEL PI IND STRL 7.0 (GLOVE) ×3 IMPLANT
GLOVE BIOGEL PI IND STRL 7.5 (GLOVE) ×1 IMPLANT
GLOVE BIOGEL PI INDICATOR 7.0 (GLOVE) ×6
GLOVE BIOGEL PI INDICATOR 7.5 (GLOVE) ×2
GLOVE ECLIPSE 6.5 STRL STRAW (GLOVE) ×3 IMPLANT
GOWN STRL REUS W/ TWL LRG LVL3 (GOWN DISPOSABLE) ×2 IMPLANT
GOWN STRL REUS W/TWL LRG LVL3 (GOWN DISPOSABLE) ×4
IV KIT MINILOC 20X1 SAFETY (NEEDLE) IMPLANT
KIT CVR 48X5XPRB PLUP LF (MISCELLANEOUS) IMPLANT
KIT PORT POWER 8FR ISP CVUE (Port) ×3 IMPLANT
NDL SAFETY ECLIPSE 18X1.5 (NEEDLE) IMPLANT
NEEDLE HYPO 18GX1.5 SHARP (NEEDLE)
NEEDLE HYPO 25X1 1.5 SAFETY (NEEDLE) ×3 IMPLANT
PACK BASIN DAY SURGERY FS (CUSTOM PROCEDURE TRAY) ×3 IMPLANT
PENCIL BUTTON HOLSTER BLD 10FT (ELECTRODE) ×3 IMPLANT
SLEEVE SCD COMPRESS KNEE MED (MISCELLANEOUS) ×3 IMPLANT
STRIP CLOSURE SKIN 1/2X4 (GAUZE/BANDAGES/DRESSINGS) ×2 IMPLANT
SUT MON AB 4-0 PC3 18 (SUTURE) ×3 IMPLANT
SUT PROLENE 2 0 SH DA (SUTURE) ×3 IMPLANT
SUT SILK 2 0 TIES 17X18 (SUTURE)
SUT SILK 2-0 18XBRD TIE BLK (SUTURE) IMPLANT
SUT VIC AB 3-0 SH 27 (SUTURE) ×2
SUT VIC AB 3-0 SH 27X BRD (SUTURE) ×1 IMPLANT
SYR 5ML LUER SLIP (SYRINGE) ×3 IMPLANT
SYR CONTROL 10ML LL (SYRINGE) ×3 IMPLANT
TOWEL OR 17X24 6PK STRL BLUE (TOWEL DISPOSABLE) ×3 IMPLANT
TOWEL OR NON WOVEN STRL DISP B (DISPOSABLE) ×3 IMPLANT
TUBE CONNECTING 20'X1/4 (TUBING)
TUBE CONNECTING 20X1/4 (TUBING) IMPLANT
YANKAUER SUCT BULB TIP NO VENT (SUCTIONS) IMPLANT

## 2017-08-05 NOTE — Interval H&P Note (Signed)
History and Physical Interval Note:  08/05/2017 12:46 PM  Stephanie Bautista  has presented today for surgery, with the diagnosis of BREAST CANCER  The various methods of treatment have been discussed with the patient and family. After consideration of risks, benefits and other options for treatment, the patient has consented to  Procedure(s): INSERTION PORT-A-CATH WITH [place] (N/A) as a surgical intervention .  The patient's history has been reviewed, patient examined, no change in status, stable for surgery.  I have reviewed the patient's chart and labs.  Questions were answered to the patient's satisfaction.     Rolm Bookbinder

## 2017-08-05 NOTE — Anesthesia Preprocedure Evaluation (Signed)
Anesthesia Evaluation  Patient identified by MRN, date of birth, ID band Patient awake    Reviewed: Allergy & Precautions, NPO status , Patient's Chart, lab work & pertinent test results  Airway Mallampati: II  TM Distance: >3 FB Neck ROM: Full    Dental no notable dental hx.    Pulmonary neg pulmonary ROS,    Pulmonary exam normal breath sounds clear to auscultation       Cardiovascular negative cardio ROS Normal cardiovascular exam Rhythm:Regular Rate:Normal     Neuro/Psych negative neurological ROS  negative psych ROS   GI/Hepatic Neg liver ROS, hiatal hernia, GERD  ,  Endo/Other  negative endocrine ROS  Renal/GU negative Renal ROS     Musculoskeletal  (+) Arthritis ,   Abdominal   Peds  Hematology negative hematology ROS (+)   Anesthesia Other Findings   Reproductive/Obstetrics negative OB ROS                             Anesthesia Physical Anesthesia Plan  ASA: III  Anesthesia Plan: General   Post-op Pain Management:    Induction: Intravenous  PONV Risk Score and Plan: 3 and Ondansetron, Dexamethasone and Midazolam  Airway Management Planned: LMA  Additional Equipment:   Intra-op Plan:   Post-operative Plan: Extubation in OR  Informed Consent: I have reviewed the patients History and Physical, chart, labs and discussed the procedure including the risks, benefits and alternatives for the proposed anesthesia with the patient or authorized representative who has indicated his/her understanding and acceptance.   Dental advisory given  Plan Discussed with: CRNA  Anesthesia Plan Comments:         Anesthesia Quick Evaluation

## 2017-08-05 NOTE — Anesthesia Postprocedure Evaluation (Signed)
Anesthesia Post Note  Patient: Stephanie Bautista  Procedure(s) Performed: INSERTION PORT-A-CATH WITH [place] (Right Chest)     Patient location during evaluation: PACU Anesthesia Type: General Level of consciousness: sedated and patient cooperative Pain management: pain level controlled Vital Signs Assessment: post-procedure vital signs reviewed and stable Respiratory status: spontaneous breathing Cardiovascular status: stable Anesthetic complications: no    Last Vitals:  Vitals:   08/05/17 1430 08/05/17 1448  BP: (!) 144/55 140/76  Pulse: 78 70  Resp: 13 16  Temp:  (!) 36.4 C  SpO2: 100% 100%    Last Pain:  Vitals:   08/05/17 1448  TempSrc:   PainSc: [place]

## 2017-08-05 NOTE — H&P (Signed)
67 yof referred by Dr Nori Riis for new left breast cancer. she has history of what sounds like right breast dcis (likely er/pr negative) treated with lumpectomy and radiotherapy in 1996. she has sister with ovarian cancer in her 49s. she has not had genetic testing. she has one daughter and one son. she is here with her husband. they are both retired. she has some right breast tenderness which is longstanding. she had no dc or mass. she had screening mm that shows b density breasts. there is a 1.5 cm mass that has a 6 mm mass next to it and then about 4 cm away there is a distortion seen on [place]. there is also question of abnl node. I have discussed case with Dr Marcelo Baldy. she had biopsy of node that is negative and concordant. she had biopsy of 1.5 cm mass that is grade III IDC. the 6 mm area has not been biopsied. the biopsy of distortion is c/w fa but this is discordant.    Past Surgical History (Tanisha A. Owens Shark, [place]; 07/26/2017 8:32 AM) Breast Biopsy  Left. Breast Mass; Local Excision  Right. Cesarean Section - Multiple  Colon Polyp Removal - Colonoscopy  Gallbladder Surgery - Laparoscopic  Hemorrhoidectomy  Hysterectomy (not due to cancer) - Partial  Shoulder Surgery  Right. Tonsillectomy   Diagnostic Studies History (Tanisha A. Owens Shark, [place]; 07/26/2017 8:32 AM) Colonoscopy  within last year Mammogram  within last year Pap Smear  1-5 years ago  Allergies (Tanisha A. Owens Shark, [place]; 07/26/2017 8:33 AM) No Known Drug Allergies [07/26/2017]: Allergies Reconciled   Medication History (Tanisha A. Owens Shark, [place]; 07/26/2017 8:34 AM) No Current Medications Medications Reconciled  Social History (Tanisha A. Owens Shark, [place]; 07/26/2017 8:32 AM) Caffeine use  Carbonated beverages. No alcohol use  No drug use  Tobacco use  Never smoker.  Family History (Tanisha A. Owens Shark, [place]; 07/26/2017 8:32 AM) Alcohol Abuse  Father. Cerebrovascular Accident  Father. Cervical Cancer  Sister. Colon  Polyps  Father, Mother. Heart Disease  Mother. Heart disease in female family member before age 34  Hypertension  Father, Mother. Melanoma  Father. Ovarian Cancer  Mother. Thyroid problems  Sister.  Pregnancy / Birth History (Tanisha A. Owens Shark, [place]; 07/26/2017 8:32 AM) Age at menarche  32 years. Age of menopause  44-50 Gravida  2 Length (months) of breastfeeding  3-6 Maternal age  62-30 Para  2  Other Problems (Tanisha A. Owens Shark, [place]; 07/26/2017 8:32 AM) Arthritis  Back Pain  Bladder Problems  Breast Cancer  Cholelithiasis  Gastroesophageal Reflux Disease  Hemorrhoids  Inguinal Hernia  Kidney Stone  Lump In Breast  Migraine Headache  Oophorectomy     Review of Systems (Tanisha A. Brown RMA; 07/26/2017 8:32 AM) General Present- Fatigue. Not Present- Appetite Loss, Chills, Fever, Night Sweats, Weight Gain and Weight Loss. Skin Not Present- Change in Wart/Mole, Dryness, Hives, Jaundice, New Lesions, Non-Healing Wounds, Rash and Ulcer. HEENT Present- Hearing Loss. Not Present- Earache, Hoarseness, Nose Bleed, Oral Ulcers, Ringing in the Ears, Seasonal Allergies, Sinus Pain, Sore Throat, Visual Disturbances, Wears glasses/contact lenses and Yellow Eyes. Respiratory Not Present- Bloody sputum, Chronic Cough, Difficulty Breathing, Snoring and Wheezing. Breast Present- Breast Mass. Not Present- Breast Pain, Nipple Discharge and Skin Changes. Cardiovascular Not Present- Chest Pain, Difficulty Breathing Lying Down, Leg Cramps, Palpitations, Rapid Heart Rate, Shortness of Breath and Swelling of Extremities. Gastrointestinal Present- Constipation, Excessive gas and Hemorrhoids. Not Present- Abdominal Pain, Bloating, Bloody Stool, Change in Bowel Habits, Chronic diarrhea, Difficulty Swallowing, Gets full quickly  at meals, Indigestion, Nausea, Rectal Pain and Vomiting. Female Genitourinary Present- Frequency and Nocturia. Not Present- Painful Urination, Pelvic Pain and  Urgency. Neurological Not Present- Decreased Memory, Fainting, Headaches, Numbness, Seizures, Tingling, Tremor, Trouble walking and Weakness. Psychiatric Not Present- Anxiety, Bipolar, Change in Sleep Pattern, Depression, Fearful and Frequent crying. Endocrine Not Present- Cold Intolerance, Excessive Hunger, Hair Changes, Heat Intolerance, Hot flashes and New Diabetes. Hematology Not Present- Blood Thinners, Easy Bruising, Excessive bleeding, Gland problems, HIV and Persistent Infections.  Vitals (Tanisha A. Brown RMA; 07/26/2017 8:33 AM) 07/26/2017 8:33 AM Weight: 143.8 lb Height: 63in Body Surface Area: 1.68 m Body Mass Index: 25.47 kg/m  Temp.: 98.42F  Pulse: 102 (Regular)  BP: 148/94 (Sitting, Left Arm, Standard)       Physical Exam Rolm Bookbinder MD; 07/26/2017 9:40 AM) General Mental Status-Alert. Orientation-Oriented X3.  Head and Neck Trachea-midline. Thyroid Gland Characteristics - normal size and consistency.  Eye Sclera/Conjunctiva - Bilateral-No scleral icterus.  Chest and Lung Exam Chest and lung exam reveals -quiet, even and easy respiratory effort with no use of accessory muscles and on auscultation, normal breath sounds, no adventitious sounds and normal vocal resonance.  Breast Nipples-No Discharge. Breast Lump-No Palpable Breast Mass. Note: large left breast hematoma    Cardiovascular Cardiovascular examination reveals -normal heart sounds, regular rate and rhythm with no murmurs.  Lymphatic Head & Neck  General Head & Neck Lymphatics: Bilateral - Description - Normal. Axillary  General Axillary Region: Bilateral - Description - Normal. Note: no [place] adenopathy   Assessment & Plan Rolm Bookbinder MD; 07/26/2017 9:42 AM) BREAST CANCER OF UPPER-OUTER QUADRANT OF LEFT FEMALE BREAST (C50.412) Port placement for primary chemo We discussed the staging and pathophysiology of breast cancer. We discussed all of the  different options for treatment for breast cancer including surgery, chemotherapy, radiation therapy, Herceptin, and antiestrogen therapy. We discussed a sentinel lymph node biopsy as she does not appear to having lymph node involvement right now. We discussed the performance of that with injection of radioactive tracer. We discussed up to a 5% risk lifetime of chronic shoulder pain as well as lymphedema associated with a sentinel lymph node biopsy. We discussed the options for treatment of the breast cancer which included lumpectomy versus a mastectomy. We discussed the performance of the lumpectomy with radioactive seed placement. We also discussed that she will likely need radiation therapy if she undergoes lumpectomy. We discussed the mastectomy (removal of whole breast) and the postoperative care for that as well. Mastectomy can be followed by reconstruction. This is a more extensive surgery and requires more recovery. The decision for lumpectomy vs mastectomy has no impact on decision for chemotherapy. Most mastectomy patients will not need radiation therapy. We discussed that there is no difference in her survival whether she undergoes lumpectomy with radiation therapy or antiestrogen therapy versus a mastectomy. There is also no real difference between her recurrence in the breast. we will decide on this once all of above is done

## 2017-08-05 NOTE — Discharge Instructions (Signed)

## 2017-08-05 NOTE — Op Note (Signed)
Preoperative diagnosis:triple negative left breast cancer, clinical stage 2 Postoperative diagnosis: same as above Procedure: right ij US guided powerport insertion Surgeon: Dr Serita Grammes EBL: minimal Anes: general  Specimens none Complications none Drains none Sponge count correct Dispo to pacu stable  Indications: This is a89 yof who has left sided triple negative breast cancer. We discussed primary chemotherapy. We discussed port placement.   Procedure: After informed consent was obtained the patient was taken to the operating room. She was given antibiotics. Sequential compression devices were on her legs. She was then placed under general anesthesia with an LMA. Then she was prepped and draped in the standard sterile surgical fashion. Surgical timeout was then performed.  I used the ultrasound to identify the right internal jugular vein. I then accessed the vein using the ultrasound.This aspirated blood. I then placed the wire. This was confirmed by fluoroscopy and ultrasound to be in the correct position.I tunneled the line between the 2 sites.I then dilated the tract and placed the dilator assembly with the sheath. This was done under fluoroscopy. I then removed the sheath and dilator. The wire was also removed. The line was then pulled back to be in the venacava. I hooked this up to the port. I sutured this into place with 2-0 Prolene in 2 places. This aspirated blood and flushed easily.This was confirmed with a final fluoroscopy. I then closed this with 2-0 Vicryl and 4-0 Monocryl.This withdrew blood and I placed heparin in it. Dermabond was placed on both the incisions.A dressing was placed. She tolerated this well and was transferred to the recovery room in stable condition

## 2017-08-05 NOTE — Anesthesia Procedure Notes (Signed)
Procedure Name: LMA Insertion Performed by: Delvecchio Madole W, CRNA Pre-anesthesia Checklist: Patient identified, Emergency Drugs available, Suction available and Patient being monitored Patient Re-evaluated:Patient Re-evaluated prior to induction Oxygen Delivery Method: Circle system utilized Preoxygenation: Pre-oxygenation with 100% oxygen Induction Type: IV induction Ventilation: Mask ventilation without difficulty LMA: LMA inserted LMA Size: 4.0 Number of attempts: 1 Placement Confirmation: positive ETCO2 Tube secured with: Tape Dental Injury: Teeth and Oropharynx as per pre-operative assessment        

## 2017-08-05 NOTE — Transfer of Care (Signed)
Immediate Anesthesia Transfer of Care Note  Patient: Jala B Fahy  Procedure(s) Performed: INSERTION PORT-A-CATH WITH [place] (Right Chest)  Patient Location: PACU  Anesthesia Type:General  Level of Consciousness: awake and sedated  Airway & Oxygen Therapy: Patient Spontanous Breathing and Patient connected to face mask oxygen  Post-op Assessment: Report given to RN and Post -op Vital signs reviewed and stable  Post vital signs: Reviewed and stable  Last Vitals:  Vitals:   08/05/17 1351 08/05/17 1352  Pulse: 77 80  Resp:  18  Temp:    SpO2: 100% 100%    Last Pain:  Vitals:   08/05/17 1202  TempSrc: Oral  PainSc: 2       Patients Stated Pain Goal: 4 (32/54/98 2641)  Complications: No apparent anesthesia complications

## 2017-08-06 ENCOUNTER — Encounter (HOSPITAL_BASED_OUTPATIENT_CLINIC_OR_DEPARTMENT_OTHER): Payer: Self-pay | Admitting: General Surgery

## 2017-08-10 ENCOUNTER — Other Ambulatory Visit: Payer: Self-pay | Admitting: Oncology

## 2017-08-10 NOTE — Progress Notes (Unsigned)
[place]  Telephone:(336) [phone] [phone]) [phone]     ID: Stephanie Bautista DOB: 08-Mar-1951  MR#: [id number]  XYI#:[id number]  Patient Care Team: Haywood Pao, MD as PCP - General (Internal Medicine) Dareen Gutzwiller, Virgie Dad, MD as Consulting Physician (Oncology) Rolm Bookbinder, MD as Consulting Physician (General Surgery) Maisie Fus, MD as Consulting Physician (Obstetrics and Gynecology) Delrae Rend, MD as Consulting Physician (Endocrinology) Verner Chol, MD as Consulting Physician (Sports Medicine) Lavonia Dana, MD as Referring Physician (Otolaryngology) OTHER MD:  CHIEF COMPLAINT: Triple negative breast cancer  CURRENT TREATMENT: Awaiting definitive surgery   HISTORY OF CURRENT ILLNESS: I saw Stephanie Bautista remotely for her history of ductal carcinoma in situ of the right breast, treated with lumpectomy 09/02/1994, with the pathology showing ([id number]) ductal carcinoma in situ.  There was no invasive component.  This was followed by radiation given between 09/21/94 and 11/04/94, with a total of 27078 centigray to the right breast and a total of 604 0 cGy to the tumor bed.  She notes that she didn't take any anti-estrogen therapy at that time.  More recently, Stephanie Bautista had routine screening mammography at [facility] on 07/16/2017 showing a possible abnormality in the left breast.  The breast density was category B.  She underwent left breast ultrasound on 07/16/2017 showing: a 1.5 cm irregular mass in the left breast upper outer quadrant posterior depth is consistent with carcinoma. A 6 mm irregular mass in the left breast upper outer quadrant posterior depth was also highly suggestive for malignancy. A lymph node in the left axillary tail was read as suspicious of malignancy.   Accordingly on 07/22/2017 she proceeded to biopsy of the left breast area in question as well as the suspicious left axillary lymph node. The pathology from this procedure showed ([id number]):  Lymph node, needle/core biopsy, left axilla with no evidence of carcinoma-- concordant. Breast, left, needle core biopsy, invasive ductal carcinoma, grade 3, prognostic indicators significant for: ER, 0% negative and PR, 0% negative. Proliferation marker Ki67 at 50%. HER2 negative signals ratio is 1.36, and the number per cell 1.70 .Breast, left, needle core biopsy, architectural distortion consistent with fibroadenoma--discordant.  She has a bilateral breast MRI scheduled for 07/28/2017.  The patient's subsequent history is as detailed below.  INTERVAL HISTORY: Stephanie Bautista was evaluated in breast clinic accompanied by her husband, Stephanie Bautista.  She followed up with her surgeon, Dr. Donne Hazel this morning and noted that she had a hematoma underneath her left axilla and prescribed hydrocodone for the relief of her symptoms. She has been using an ice pack and applying pressure to the area.    She reports that she no longer has her gallbladder and she had a hysterectomy with only her right ovary left. She denies any skin cancers at this time.    REVIEW OF SYSTEMS: Stephanie Bautista reports that she has constipation chronically. For exercise, she rides bicycles and she walks. She denies unusual headaches, visual changes, nausea, vomiting, or dizziness. There has been no unusual cough, phlegm production, or pleurisy. This been no change in bowel or bladder habits. She denies unexplained fatigue or unexplained weight loss, bleeding, rash, or fever. A detailed review of systems was otherwise stable.    PAST MEDICAL HISTORY: Past Medical History:  Diagnosis Date  . Acoustic neuroma ([place])   . Adenomatous colon polyp 12/2002  . Arthritis   . Breast cancer ([place]) 1996   right breast cancer in 1996, left breast cancer in 2019  . Cholelithiases   .  Esophageal stricture   . Family history of breast cancer   . Family history of ovarian cancer   . Family history of pancreatic cancer   . Family history of prostate cancer   . GERD  (gastroesophageal reflux disease)   . Hemorrhoids   . Hiatal hernia   . History of colon polyps   . Inguinal hernia   . UTI (lower urinary tract infection)   As far as PMHx she has had intermittent back pain (spinal stenosis) x 2 years,  Osteoporosis, random migraines, early cataracts and early hear loss due to schwannoma.   PAST SURGICAL HISTORY: Past Surgical History:  Procedure Laterality Date  . BREAST LUMPECTOMY    . CESAREAN SECTION     2x  . CHOLECYSTECTOMY  2008  . HEMORRHOID SURGERY  2007  . INGUINAL HERNIA REPAIR  1968  . PARTIAL HYSTERECTOMY  1996  . PORTACATH PLACEMENT Right 08/05/2017   Procedure: INSERTION PORT-A-CATH WITH [place];  Surgeon: Rolm Bookbinder, MD;  Location: [place];  Service: General;  Laterality: Right;  . TONSILLECTOMY      FAMILY HISTORY Family History  Problem Relation Age of Onset  . Heart disease Mother   . Melanoma Mother        dx in her 34s  . Ovarian cancer Mother 18  . Heart disease Father   . Diabetes Father   . Stroke Father   . Ovarian cancer Sister 27  . Pancreatic cancer Maternal Aunt   . Bone cancer Maternal Uncle   . Breast cancer Paternal Aunt   . Non-Hodgkin's lymphoma Maternal Grandmother   . Heart disease Maternal Grandfather   . Heart disease Paternal Grandmother   . Prostate cancer Paternal Grandfather   . Stroke Maternal Uncle   . Bone cancer Maternal Uncle   . Cancer Maternal Aunt        NOS  . Breast cancer Cousin        daughter of mat aunt with NOS cancer  Her mother died from CHF at age 17. Her father died from cardiac issues at age 67. She doesn't have any brothers, but has 3 sisters. She had one sister and her mother with ovarian cancer. Her sister was age 6 when she was diagnosed with ovarian cancer and her mother was in her late 49's when she was diagnosed with ovarian cancer. Her sister was not tested genetically following this. Her maternal grandmother had non-hodgkins lymphoma. Her  paternal grandfather had prostate cancer. Her paternal aunt had breast cancer. Her paternal aunt's daughter had breast cancer. She had several uncles that had prostate cancer that spread to the bones.   GYNECOLOGIC HISTORY:  No LMP recorded. Patient has had a hysterectomy. Menarche: 67 years old Age at first live birth: 67 years old [place] P2 LMP: hysterectomy at age 28 S/p Hysterectomy with USO (L)  SOCIAL HISTORY: She has been retired from Medical sales representative work since 05/26/2017. Her husband, Stephanie Bautista has been retired from the [employer] for the past 9 years. Their son is Stephanie Bautista is 32 and a IT trainer. Their daughter, Stephanie Bautista lives in [facility], is 29 and works in child care at [employer]. The patient has 6 grandchildren ( 2 boys and 4 girls). She is a Psychologist, forensic.      ADVANCED DIRECTIVES:    HEALTH MAINTENANCE: Social History   Tobacco Use  . Smoking status: Never Smoker  . Smokeless tobacco: Never Used  Substance Use Topics  . Alcohol use:  No  . Drug use: No     Colonoscopy: Dr. Fuller Plan.   PAP: Dr Nori Riis  Bone density: at Dr. Verlon Au office/ osteoporosis.    Allergies  Allergen Reactions  . Amoxicillin Itching and Rash    Current Outpatient Medications  Medication Sig Dispense Refill  . acetaminophen (TYLENOL) 500 MG tablet Take 500 mg by mouth every 6 (six) hours as needed.    Marland Kitchen alum & mag hydroxide-simeth (MAALOX/MYLANTA) 200-200-20 MG/5ML suspension Take by mouth every 6 (six) hours as needed for indigestion or heartburn.    . baclofen (LIORESAL) 10 MG tablet Take 10 mg by mouth as needed for muscle spasms.    Marland Kitchen omeprazole (PRILOSEC) 40 MG capsule Take 1 capsule (40 mg total) by mouth 2 (two) times daily. 60 capsule 11   No current facility-administered medications for this visit.     OBJECTIVE: Middle-aged white woman who appears stated age  There were no vitals filed for this visit.   There is no height or weight on file to calculate BMI.   Wt Readings from  Last 3 Encounters:  08/05/17 142 lb 6 oz (64.6 kg)  07/27/17 141 lb 9.6 oz (64.2 kg)  07/29/16 128 lb (58.1 kg)      ECOG FS:1 - Symptomatic but completely ambulatory  Ocular: Sclerae unicteric, pupils round and equal Ear-nose-throat: Oropharynx clear and moist Lymphatic: No cervical or supraclavicular adenopathy Lungs no rales or rhonchi Heart regular rate and rhythm Abd soft, nontender, positive bowel sounds MSK no focal spinal tenderness, no joint edema Neuro: non-focal, well-oriented, appropriate affect Breasts: The right breast is status post remote lumpectomy and radiation.  There is no evidence of local recurrence.  The right axilla is benign.  The left breast is status post recent biopsy.  There is a very large ecchymosis in the breast is swollen and noticeably larger than the right.  The left axilla is benign   LAB RESULTS:  CMP     Component Value Date/Time   NA 141 07/27/2017 1007   K 4.5 07/27/2017 1007   CL 106 07/27/2017 1007   CO2 26 07/27/2017 1007   GLUCOSE 76 07/27/2017 1007   BUN 10 07/27/2017 1007   CREATININE 0.81 07/27/2017 1007   CREATININE 0.69 11/16/2011 1054   CALCIUM 9.8 07/27/2017 1007   PROT 7.4 07/27/2017 1007   ALBUMIN 4.1 07/27/2017 1007   AST 16 07/27/2017 1007   ALT 12 07/27/2017 1007   ALKPHOS 81 07/27/2017 1007   BILITOT 0.6 07/27/2017 1007   GFRNONAA >60 07/27/2017 1007   GFRAA >60 07/27/2017 1007    No results found for: TOTALPROTELP, ALBUMINELP, A1GS, A2GS, BETS, BETA2SER, GAMS, MSPIKE, SPEI  No results found for: KPAFRELGTCHN, LAMBDASER, KAPLAMBRATIO  Lab Results  Component Value Date   WBC 5.6 07/27/2017   NEUTROABS 3.6 07/27/2017   HGB 14.3 11/16/2011   HCT 42.4 07/27/2017   MCV 88.3 07/27/2017   PLT 203 07/27/2017    _0 @  No results found for: LABCA2  No components found for: ZOXWRU045  No results for input(s): INR in the last 168 hours.  No results found for: LABCA2  No results found for:  WUJ811  No results found for: BJY782  No results found for: NFA213  No results found for: CA2729  No components found for: HGQUANT  No results found for: CEA1 / No results found for: CEA1   No results found for: AFPTUMOR  No results found for: Stephanie Bautista  No results found for: PSA1  No visits with results within 3 Day(s) from this visit.  Latest known visit with results is:  Appointment on 07/27/2017  Component Date Value Ref Range Status  . WBC Count 07/27/2017 5.6  3.9 - 10.3 K/uL Final  . RBC 07/27/2017 4.80  3.70 - 5.45 MIL/uL Final  . Hemoglobin 07/27/2017 13.7  11.6 - 15.9 g/dL Final  . HCT 07/27/2017 42.4  34.8 - 46.6 % Final  . MCV 07/27/2017 88.3  79.5 - 101.0 fL Final  . MCH 07/27/2017 28.5  25.1 - 34.0 pg Final  . MCHC 07/27/2017 32.3  31.5 - 36.0 g/dL Final  . RDW 07/27/2017 13.1  11.2 - 14.5 % Final  . Platelet Count 07/27/2017 203  145 - 400 K/uL Final  . Neutrophils Relative % 07/27/2017 63  % Final  . Neutro Abs 07/27/2017 3.6  1.5 - 6.5 K/uL Final  . Lymphocytes Relative 07/27/2017 26  % Final  . Lymphs Abs 07/27/2017 1.4  0.9 - 3.3 K/uL Final  . Monocytes Relative 07/27/2017 9  % Final  . Monocytes Absolute 07/27/2017 0.5  0.1 - 0.9 K/uL Final  . Eosinophils Relative 07/27/2017 2  % Final  . Eosinophils Absolute 07/27/2017 0.1  0.0 - 0.5 K/uL Final  . Basophils Relative 07/27/2017 0  % Final  . Basophils Absolute 07/27/2017 0.0  0.0 - 0.1 K/uL Final   Performed at [facility], [place] [address], [place], [place] [address]  . Sodium 07/27/2017 141  136 - 145 mmol/L Final  . Potassium 07/27/2017 4.5  3.5 - 5.1 mmol/L Final  . Chloride 07/27/2017 106  98 - 109 mmol/L Final  . CO2 07/27/2017 26  22 - 29 mmol/L Final  . Glucose, Bld 07/27/2017 76  70 - 140 mg/dL Final  . BUN 07/27/2017 10  7 - 26 mg/dL Final  . Creatinine 07/27/2017 0.81  0.60 - 1.10 mg/dL Final  . Calcium 07/27/2017 9.8  8.4 - 10.4 mg/dL Final  . Total Protein  07/27/2017 7.4  6.4 - 8.3 g/dL Final  . Albumin 07/27/2017 4.1  3.5 - 5.0 g/dL Final  . AST 07/27/2017 16  5 - 34 U/L Final  . ALT 07/27/2017 12  0 - 55 U/L Final  . Alkaline Phosphatase 07/27/2017 81  40 - 150 U/L Final  . Total Bilirubin 07/27/2017 0.6  0.2 - 1.2 mg/dL Final  . GFR, Est Non Af Am 07/27/2017 >60  >60 mL/min Final  . GFR, Est AFR Am 07/27/2017 >60  >60 mL/min Final   Comment: (NOTE) The eGFR has been calculated using the CKD EPI equation. This calculation has not been validated in all clinical situations. eGFR's persistently <60 mL/min signify possible Chronic Kidney Disease.   Georgiann Hahn gap 07/27/2017 9  3 - 11 Final   Performed at [facility], [place] [address] [place], [place] [address]    (this displays the last labs from the last 3 days)  No results found for: TOTALPROTELP, ALBUMINELP, A1GS, A2GS, BETS, BETA2SER, GAMS, MSPIKE, SPEI (this displays SPEP labs)  No results found for: KPAFRELGTCHN, LAMBDASER, KAPLAMBRATIO (kappa/lambda light chains)  No results found for: HGBA, HGBA2QUANT, HGBFQUANT, HGBSQUAN (Hemoglobinopathy evaluation)   No results found for: LDH  No results found for: IRON, TIBC, IRONPCTSAT (Iron and TIBC)  No results found for: FERRITIN  Urinalysis    Component Value Date/Time   BILIRUBINUR neg 04/06/2014 1019   PROTEINUR neg 04/06/2014 1019   UROBILINOGEN 0.2 04/06/2014 1019  NITRITE neg 04/06/2014 1019   LEUKOCYTESUR Negative 04/06/2014 1019     STUDIES: Imaging results discussed in depth with the patient  ELIGIBLE FOR AVAILABLE RESEARCH PROTOCOL: BR003 if treated adjuvantly, UPBEAT  ASSESSMENT: 67 y.o. Stephanie Bautista woman  (1) status post right lumpectomy 09/02/1994 for ductal carcinoma in situ  (a) status post adjuvant radiation 09/21/1994 through 11/04/1994, total 5040 centigrade to the breast and 604 0 cGy to the tumor bed  (b) did not receive adjuvant antiestrogens  (2) status post left breast  upper outer quadrant biopsy 07/22/2017 for a clinical T1c pN0, stage IB invasive ductal carcinoma, grade 3, triple negative, with an MIB-1 of 50%.  (a) lymph node biopsy on the same day was negative for malignancy (concordant)  (b) a second area of architectural distortion was also biopsied, read as fibroadenoma (discordant)  (c) a third area of architectural distortion has not yet been biopsied  (d) breast MRI scheduled for 07/28/2017  (3) genetics testing 07/27/2017  (4) chemotherapy will consist of doxorubicin and cyclophosphamide in dose dense fashion x4 followed by paclitaxel weekly x12  (a) if treated adjuvantly the patient may qualify for BR 003 study  (5) definitive surgery pending  (6) adjuvant radiation to follow as appropriate  PLAN: We spent the better part of today's hour-long appointment discussing the biology of her diagnosis and the specifics of her situation. We first reviewed the fact that cancer is not one disease but more than 100 different diseases and that it is important to keep them separate-- otherwise when friends and relatives discuss their own cancer experiences with Naveyah confusion can result. Similarly we explained that if breast cancer spreads to the bone or liver, the patient would not have bone cancer or liver cancer, but breast cancer in the bone and breast cancer in the liver: one cancer in three places-- not 3 different cancers which otherwise would have to be treated in 3 different ways.  We discussed the difference between local and systemic therapy. In terms of loco-regional treatment, lumpectomy plus radiation is equivalent to mastectomy as far as survival is concerned.  Whether or not Justice will be able to have a lumpectomy depends on results of the pending MRI and possibly additional left breast biopsies.  If she does have a mastectomy she understands there are different types including nipple sparing, skin sparing, simple, and modified radical.    We also  noted that in terms of sequencing of treatments, whether systemic therapy or surgery is done first does not affect the ultimate outcome.  If her surgeon feels it would be best for her to receive chemotherapy first, she would receive the same chemo that she would receive postop and she would obtain the same ultimate results.  Zalayah had genetics testing today. In patients who carry a deleterious mutation [for example in a  BRCA gene], the risk of a new breast cancer developing in the future may be sufficiently great that the patient may choose bilateral mastectomies. However if she wishes to keep her breasts in that situation it is safe to do so. That would require intensified screening, which generally means not only yearly mammography but a yearly breast MRI as well. Of course, if there is a BRCA mutation bilateral oophorectomy would be necessary as there is no standard screening protocol for ovarian cancer.  We then discussed the rationale for systemic therapy. There is some risk that this cancer may have already spread to other parts of her body. Patients frequently ask at this  point about bone scans, CAT scans and PET scans to find out if they have occult breast cancer somewhere else. The problem is that in early stage disease we are much more likely to find false positives then true cancers and this would expose the patient to unnecessary procedures as well as unnecessary radiation. Scans cannot answer the question the patient really would like to know, which is whether she has microscopic disease elsewhere in her body. For those reasons we do not recommend them.  Of course we would proceed to aggressive evaluation of any symptoms that might suggest metastatic disease, but that is not the case here.  Next we went over the options for systemic therapy which are anti-estrogens, anti-HER-2 immunotherapy, and chemotherapy. Estha does not meet criteria for anti-HER-2 immunotherapy or anti-estrogens.   Accordingly her only choice for systemic therapy is chemotherapy and that is my strong recommendation  We discussed the standard chemotherapy in detail and she is acquainted with the possible toxicity side effects and complications of cyclophosphamide, doxorubicin, as well as paclitaxel which will follow.  She understands she will need a port and an echocardiogram. She will also come to chemotherapy school to learn more about the side effects of chemotherapy.  Finally I assured her that even for triple negative cases the prognosis this days with standard therapy is very good, with approximately 90% of patients being alive with no clinical evidence of cancer 5 years post initial diagnosis.  Deyona has a good understanding of the overall plan. She agrees with it. She knows the goal of treatment in her case is cure. She will call with any problems that may develop before her next visit here.  Indea Dearman, Virgie Dad, MD  08/10/17 11:06 AM Medical Oncology and Hematology [facility] [address] [place], [place] [address] Tel. [phone]    Fax. [phone]    This document serves as a record of services personally performed by Lurline Del, MD. It was created on his behalf by Steva Colder, a trained medical scribe. The creation of this record is based on the scribe's personal observations and the provider's statements to them.   I have reviewed the above documentation for accuracy and completeness, and I agree with the above.

## 2017-08-13 ENCOUNTER — Inpatient Hospital Stay (HOSPITAL_BASED_OUTPATIENT_CLINIC_OR_DEPARTMENT_OTHER): Payer: Medicare Other | Admitting: Oncology

## 2017-08-13 ENCOUNTER — Telehealth: Payer: Self-pay | Admitting: Oncology

## 2017-08-13 VITALS — BP 155/76 | HR 72 | Temp 97.4°F | Resp 18 | Ht 63.0 in | Wt 143.4 lb

## 2017-08-13 DIAGNOSIS — M810 Age-related osteoporosis without current pathological fracture: Secondary | ICD-10-CM

## 2017-08-13 DIAGNOSIS — Z171 Estrogen receptor negative status [ER-]: Secondary | ICD-10-CM | POA: Diagnosis not present

## 2017-08-13 DIAGNOSIS — K5900 Constipation, unspecified: Secondary | ICD-10-CM | POA: Diagnosis not present

## 2017-08-13 DIAGNOSIS — C50412 Malignant neoplasm of upper-outer quadrant of left female breast: Secondary | ICD-10-CM

## 2017-08-13 DIAGNOSIS — Z86000 Personal history of in-situ neoplasm of breast: Secondary | ICD-10-CM

## 2017-08-13 DIAGNOSIS — Z90710 Acquired absence of both cervix and uterus: Secondary | ICD-10-CM | POA: Diagnosis not present

## 2017-08-13 DIAGNOSIS — D0511 Intraductal carcinoma in situ of right breast: Secondary | ICD-10-CM

## 2017-08-13 MED ORDER — DEXAMETHASONE 4 MG PO TABS: ORAL_TABLET | ORAL | 1 refills | Status: DC

## 2017-08-13 MED ORDER — PROCHLORPERAZINE MALEATE 10 MG PO TABS
10.0000 mg | ORAL_TABLET | Freq: Four times a day (QID) | ORAL | 1 refills | Status: DC | PRN
Start: 2017-08-13 — End: 2018-10-31

## 2017-08-13 MED ORDER — LIDOCAINE-PRILOCAINE 2.5-2.5 % EX CREA
TOPICAL_CREAM | CUTANEOUS | 3 refills | Status: DC
Start: 2017-08-13 — End: 2018-01-18

## 2017-08-13 MED ORDER — LORAZEPAM 0.5 MG PO TABS: 0.5000 mg | ORAL_TABLET | Freq: Every evening | ORAL | 0 refills | Status: DC | PRN

## 2017-08-13 NOTE — Progress Notes (Signed)
START ON PATHWAY REGIMEN - Breast     A cycle is every 21 days:     Paclitaxel      Carboplatin      Doxorubicin      Cyclophosphamide   **Always confirm dose/schedule in your pharmacy ordering system**    Patient Characteristics: Preoperative or Nonsurgical Candidate (Clinical Staging), Neoadjuvant Therapy followed by Surgery, Invasive Disease, Chemotherapy, HER2 Negative/Unknown/Equivocal, ER Negative/Unknown, Platinum Therapy Indicated Therapeutic Status: Preoperative or Nonsurgical Candidate (Clinical Staging) AJCC M Category: cM0 AJCC Grade: G3 Breast Surgical Plan: Neoadjuvant Therapy followed by Surgery ER Status: Negative (-) AJCC 8 Stage Grouping: IIB HER2 Status: Negative (-) AJCC T Category: cT2 AJCC N Category: cN0 PR Status: Negative (-) Type of Therapy: Platinum Therapy Indicated Intent of Therapy: Curative Intent, Discussed with Patient

## 2017-08-13 NOTE — Telephone Encounter (Signed)
Gave avs and calendar for april °

## 2017-08-16 ENCOUNTER — Other Ambulatory Visit: Payer: Self-pay | Admitting: Oncology

## 2017-08-16 ENCOUNTER — Encounter: Payer: Self-pay | Admitting: Medical Oncology

## 2017-08-16 ENCOUNTER — Encounter: Payer: Self-pay | Admitting: Oncology

## 2017-08-16 NOTE — Progress Notes (Signed)
Pt's Lidocaine-Prilocaine cream was approved from 05/18/17 - 11/14/17.

## 2017-08-16 NOTE — Progress Notes (Unsigned)
We obtained a pathology from [facility] right breast mass biopsy obtained 09/02/1994.  This was the only pathology available in [place].  [phone] shows ductal carcinoma in situ, measuring approximately 1 cm, extending to within a millimeter of the margin the nuclei were low-grade and there was no comedo component

## 2017-08-16 NOTE — Progress Notes (Signed)
Submitted auth request for Lidocaine-Prilocaine cream today.  Status is pending.  °

## 2017-08-17 ENCOUNTER — Other Ambulatory Visit: Payer: Self-pay | Admitting: *Deleted

## 2017-08-17 ENCOUNTER — Telehealth: Payer: Self-pay

## 2017-08-17 NOTE — Telephone Encounter (Signed)
Returned pt call regarding Neulasta. She was under the impression that she would pick it up from her pharmacy. I explained she would receive On-Pro after her chemotherapy treatment. I also sent a message for prior authorization if needed.  Cyndia Bent RN

## 2017-08-19 ENCOUNTER — Encounter: Payer: Self-pay | Admitting: Oncology

## 2017-08-19 NOTE — Progress Notes (Signed)
Called pt to introduce myself as her Arboriculturist.  Pt has 2 insurances so copay assistance shouldn't be needed.  I informed her of the [employer] and went over what it covers but pt stated she thinks she's fine in that area but will contact me if anything changes with her situation.

## 2017-08-23 ENCOUNTER — Ambulatory Visit: Payer: Medicare Other | Admitting: Oncology

## 2017-08-23 ENCOUNTER — Inpatient Hospital Stay: Payer: Medicare Other

## 2017-08-23 ENCOUNTER — Other Ambulatory Visit: Payer: Medicare Other

## 2017-08-23 ENCOUNTER — Inpatient Hospital Stay: Payer: Medicare Other | Attending: Oncology

## 2017-08-23 ENCOUNTER — Inpatient Hospital Stay: Payer: Medicare Other | Admitting: Nutrition

## 2017-08-23 DIAGNOSIS — D709 Neutropenia, unspecified: Secondary | ICD-10-CM | POA: Insufficient documentation

## 2017-08-23 DIAGNOSIS — Z171 Estrogen receptor negative status [ER-]: Secondary | ICD-10-CM

## 2017-08-23 DIAGNOSIS — Z95828 Presence of other vascular implants and grafts: Secondary | ICD-10-CM

## 2017-08-23 DIAGNOSIS — M810 Age-related osteoporosis without current pathological fracture: Secondary | ICD-10-CM | POA: Diagnosis not present

## 2017-08-23 DIAGNOSIS — C50412 Malignant neoplasm of upper-outer quadrant of left female breast: Secondary | ICD-10-CM | POA: Insufficient documentation

## 2017-08-23 DIAGNOSIS — D0511 Intraductal carcinoma in situ of right breast: Secondary | ICD-10-CM

## 2017-08-23 DIAGNOSIS — Z86000 Personal history of in-situ neoplasm of breast: Secondary | ICD-10-CM | POA: Diagnosis not present

## 2017-08-23 DIAGNOSIS — R51 Headache: Secondary | ICD-10-CM | POA: Diagnosis not present

## 2017-08-23 DIAGNOSIS — Z5111 Encounter for antineoplastic chemotherapy: Secondary | ICD-10-CM | POA: Diagnosis not present

## 2017-08-23 LAB — CBC WITH DIFFERENTIAL/PLATELET
Basophils Absolute: 0 10*3/uL (ref 0.0–0.1)
Basophils Relative: 1 %
EOS PCT: 2 %
Eosinophils Absolute: 0.1 10*3/uL (ref 0.0–0.5)
HCT: 37.4 % (ref 34.8–46.6)
Hemoglobin: 12.5 g/dL (ref 11.6–15.9)
Lymphocytes Relative: 27 %
Lymphs Abs: 1.2 10*3/uL (ref 0.9–3.3)
MCH: 28.6 pg (ref 25.1–34.0)
MCHC: 33.4 g/dL (ref 31.5–36.0)
MCV: 85.5 fL (ref 79.5–101.0)
Monocytes Absolute: 0.4 10*3/uL (ref 0.1–0.9)
Monocytes Relative: 9 %
Neutro Abs: 2.7 10*3/uL (ref 1.5–6.5)
Neutrophils Relative %: 61 %
PLATELETS: 191 10*3/uL (ref 145–400)
RBC: 4.38 MIL/uL (ref 3.70–5.45)
RDW: 13.4 % (ref 11.2–14.5)
WBC: 4.3 10*3/uL (ref 3.9–10.3)

## 2017-08-23 LAB — COMPREHENSIVE METABOLIC PANEL
ALK PHOS: 78 U/L (ref 40–150)
ALT: 11 U/L (ref 0–55)
AST: 14 U/L (ref 5–34)
Albumin: 3.8 g/dL (ref 3.5–5.0)
Anion gap: 7 (ref 3–11)
BUN: 10 mg/dL (ref 7–26)
CO2: 25 mmol/L (ref 22–29)
Calcium: 9.3 mg/dL (ref 8.4–10.4)
Chloride: 107 mmol/L (ref 98–109)
Creatinine, Ser: 0.75 mg/dL (ref 0.60–1.10)
GFR calc Af Amer: >60 mL/min (ref >60)
Glucose, Bld: 138 mg/dL (ref 70–140)
Potassium: 3.5 mmol/L (ref 3.5–5.1)
Sodium: 139 mmol/L (ref 136–145)
TOTAL PROTEIN: 6.9 g/dL (ref 6.4–8.3)
Total Bilirubin: 0.3 mg/dL (ref 0.2–1.2)

## 2017-08-23 MED ORDER — SODIUM CHLORIDE 0.9% FLUSH
10.0000 mL | Freq: Once | INTRAVENOUS | Status: AC
  Administered 2017-08-23: 10 mL
  Filled 2017-08-23: qty 10

## 2017-08-23 MED ORDER — HEPARIN SOD (PORK) LOCK FLUSH 100 UNIT/ML IV SOLN
500.0000 [IU] | Freq: Once | INTRAVENOUS | Status: AC
  Administered 2017-08-23: 500 [IU]
  Filled 2017-08-23: qty 5

## 2017-08-23 NOTE — Progress Notes (Signed)
67 year old female diagnosed with breast cancer to receive chemotherapy and radiation treatments.  Past medical history includes UTI, GERD, and esophageal strictures.  Medications include Maalox, Compazine, and Prilosec.  Labs were reviewed.  Height: 63 inches. Weight: 143.4 pounds. Usual body weight: 128 pounds. BMI: 25.4.  Patient and husband would like to know what patient should eat or not eat during chemotherapy.  Nutrition diagnosis:  Food and nutrition related knowledge deficit related to breast cancer and associated treatments as evidenced by no prior need for nutrition related information.  Intervention: Educated patient on the importance of plant-based healthy diet to minimize weight loss throughout treatment.   Reviewed evidence-based research surrounding soy foods, sugar and cancer, food safety, and taste alterations. Provided fact sheets and answered questions.  Contact information was given.  Teach back method used.  Monitoring evaluation goals: Patient will tolerate adequate calories and protein for weight maintenance throughout treatment.  No follow-up required;patient has my contact information.  **Disclaimer: This note was dictated with voice recognition software. Similar sounding words can inadvertently be transcribed and this note may contain transcription errors which may not have been corrected upon publication of note.**

## 2017-08-24 ENCOUNTER — Inpatient Hospital Stay: Payer: Medicare Other

## 2017-08-24 ENCOUNTER — Encounter: Payer: Self-pay | Admitting: *Deleted

## 2017-08-24 VITALS — BP 151/62 | HR 77 | Temp 98.3°F | Resp 16

## 2017-08-24 DIAGNOSIS — Z86000 Personal history of in-situ neoplasm of breast: Secondary | ICD-10-CM | POA: Diagnosis not present

## 2017-08-24 DIAGNOSIS — Z5111 Encounter for antineoplastic chemotherapy: Secondary | ICD-10-CM | POA: Diagnosis not present

## 2017-08-24 DIAGNOSIS — C50412 Malignant neoplasm of upper-outer quadrant of left female breast: Secondary | ICD-10-CM | POA: Diagnosis not present

## 2017-08-24 DIAGNOSIS — D709 Neutropenia, unspecified: Secondary | ICD-10-CM | POA: Diagnosis not present

## 2017-08-24 DIAGNOSIS — Z171 Estrogen receptor negative status [ER-]: Principal | ICD-10-CM

## 2017-08-24 DIAGNOSIS — M810 Age-related osteoporosis without current pathological fracture: Secondary | ICD-10-CM | POA: Diagnosis not present

## 2017-08-24 DIAGNOSIS — R51 Headache: Secondary | ICD-10-CM | POA: Diagnosis not present

## 2017-08-24 MED ORDER — PEGFILGRASTIM 6 MG/0.6ML ~~LOC~~ PSKT
PREFILLED_SYRINGE | SUBCUTANEOUS | Status: AC
  Filled 2017-08-24: qty 0.6

## 2017-08-24 MED ORDER — PALONOSETRON HCL INJECTION 0.25 MG/5ML
0.2500 mg | Freq: Once | INTRAVENOUS | Status: AC
  Administered 2017-08-24: 0.25 mg via INTRAVENOUS

## 2017-08-24 MED ORDER — SODIUM CHLORIDE 0.9 % IV SOLN
Freq: Once | INTRAVENOUS | Status: AC
  Administered 2017-08-24: 09:00:00 via INTRAVENOUS

## 2017-08-24 MED ORDER — PALONOSETRON HCL INJECTION 0.25 MG/5ML
INTRAVENOUS | Status: AC
  Filled 2017-08-24: qty 5

## 2017-08-24 MED ORDER — PEGFILGRASTIM 6 MG/0.6ML ~~LOC~~ PSKT
6.0000 mg | PREFILLED_SYRINGE | Freq: Once | SUBCUTANEOUS | Status: AC
Start: 2017-08-24 — End: 2017-08-24
  Administered 2017-08-24: 6 mg via SUBCUTANEOUS

## 2017-08-24 MED ORDER — DOXORUBICIN HCL CHEMO IV INJECTION 2 MG/ML
60.0000 mg/m2 | Freq: Once | INTRAVENOUS | Status: AC
  Administered 2017-08-24: 102 mg via INTRAVENOUS
  Filled 2017-08-24: qty 51

## 2017-08-24 MED ORDER — SODIUM CHLORIDE 0.9% FLUSH
10.0000 mL | INTRAVENOUS | Status: DC | PRN
  Administered 2017-08-24: 10 mL
  Filled 2017-08-24: qty 10

## 2017-08-24 MED ORDER — HEPARIN SOD (PORK) LOCK FLUSH 100 UNIT/ML IV SOLN
500.0000 [IU] | Freq: Once | INTRAVENOUS | Status: AC | PRN
  Administered 2017-08-24: 500 [IU]
  Filled 2017-08-24: qty 5

## 2017-08-24 MED ORDER — SODIUM CHLORIDE 0.9 % IV SOLN
600.0000 mg/m2 | Freq: Once | INTRAVENOUS | Status: AC
  Administered 2017-08-24: 1020 mg via INTRAVENOUS
  Filled 2017-08-24: qty 51

## 2017-08-24 MED ORDER — SODIUM CHLORIDE 0.9 % IV SOLN
Freq: Once | INTRAVENOUS | Status: AC
  Administered 2017-08-24: 09:00:00 via INTRAVENOUS
  Filled 2017-08-24: qty 5

## 2017-08-24 NOTE — Patient Instructions (Signed)
[facility] Discharge Instructions for Patients Receiving Chemotherapy  Today you received the following chemotherapy agents Adriamycin and Cytoxan.   To help prevent nausea and vomiting after your treatment, we encourage you to take your nausea medication as directed.  If you develop nausea and vomiting that is not controlled by your nausea medication, call the clinic.   BELOW ARE SYMPTOMS THAT SHOULD BE REPORTED IMMEDIATELY:  *FEVER GREATER THAN 100.5 F  *CHILLS WITH OR WITHOUT FEVER  NAUSEA AND VOMITING THAT IS NOT CONTROLLED WITH YOUR NAUSEA MEDICATION  *UNUSUAL SHORTNESS OF BREATH  *UNUSUAL BRUISING OR BLEEDING  TENDERNESS IN MOUTH AND THROAT WITH OR WITHOUT PRESENCE OF ULCERS  *URINARY PROBLEMS  *BOWEL PROBLEMS  UNUSUAL RASH Items with * indicate a potential emergency and should be followed up as soon as possible.  Feel free to call the clinic should you have any questions or concerns. The clinic phone number is [phone].  Please show the CHEMO ALERT CARD at check-in to the Emergency Department and triage nurse. Doxorubicin injection What is this medicine? DOXORUBICIN (dox oh ROO bi sin) is a chemotherapy drug. It is used to treat many kinds of cancer like leukemia, lymphoma, neuroblastoma, sarcoma, and Wilms' tumor. It is also used to treat bladder cancer, breast cancer, lung cancer, ovarian cancer, stomach cancer, and thyroid cancer. This medicine may be used for other purposes; ask your health care provider or pharmacist if you have questions. COMMON BRAND NAME(S): Adriamycin, Adriamycin PFS, Adriamycin RDF, Rubex What should I tell my health care provider before I take this medicine? They need to know if you have any of these conditions: -heart disease -history of low blood counts caused by a medicine -liver disease -recent or ongoing radiation therapy -an unusual or allergic reaction to doxorubicin, other chemotherapy agents, other medicines,  foods, dyes, or preservatives -pregnant or trying to get pregnant -breast-feeding How should I use this medicine? This drug is given as an infusion into a vein. It is administered in a hospital or clinic by a specially trained health care professional. If you have pain, swelling, burning or any unusual feeling around the site of your injection, tell your health care professional right away. Talk to your pediatrician regarding the use of this medicine in children. Special care may be needed. Overdosage: If you think you have taken too much of this medicine contact a poison control center or emergency room at once. NOTE: This medicine is only for you. Do not share this medicine with others. What if I miss a dose? It is important not to miss your dose. Call your doctor or health care professional if you are unable to keep an appointment. What may interact with this medicine? This medicine may interact with the following medications: -6-mercaptopurine -paclitaxel -phenytoin -St. John's Wort -trastuzumab -verapamil This list may not describe all possible interactions. Give your health care provider a list of all the medicines, herbs, non-prescription drugs, or dietary supplements you use. Also tell them if you smoke, drink alcohol, or use illegal drugs. Some items may interact with your medicine. What should I watch for while using this medicine? This drug may make you feel generally unwell. This is not uncommon, as chemotherapy can affect healthy cells as well as cancer cells. Report any side effects. Continue your course of treatment even though you feel ill unless your doctor tells you to stop. There is a maximum amount of this medicine you should receive throughout your life. The amount depends on the   medical condition being treated and your overall health. Your doctor will watch how much of this medicine you receive in your lifetime. Tell your doctor if you have taken this medicine before. You  may need blood work done while you are taking this medicine. Your urine may turn red for a few days after your dose. This is not blood. If your urine is dark or brown, call your doctor. In some cases, you may be given additional medicines to help with side effects. Follow all directions for their use. Call your doctor or health care professional for advice if you get a fever, chills or sore throat, or other symptoms of a cold or flu. Do not treat yourself. This drug decreases your body's ability to fight infections. Try to avoid being around people who are sick. This medicine may increase your risk to bruise or bleed. Call your doctor or health care professional if you notice any unusual bleeding. Talk to your doctor about your risk of cancer. You may be more at risk for certain types of cancers if you take this medicine. Do not become pregnant while taking this medicine or for 6 months after stopping it. Women should inform their doctor if they wish to become pregnant or think they might be pregnant. Men should not father a child while taking this medicine and for 6 months after stopping it. There is a potential for serious side effects to an unborn child. Talk to your health care professional or pharmacist for more information. Do not breast-feed an infant while taking this medicine. This medicine has caused ovarian failure in some women and reduced sperm counts in some men This medicine may interfere with the ability to have a child. Talk with your doctor or health care professional if you are concerned about your fertility. What side effects may I notice from receiving this medicine? Side effects that you should report to your doctor or health care professional as soon as possible: -allergic reactions like skin rash, itching or hives, swelling of the face, lips, or tongue -breathing problems -chest pain -fast or irregular heartbeat -low blood counts - this medicine may decrease the number of white  blood cells, red blood cells and platelets. You may be at increased risk for infections and bleeding. -pain, redness, or irritation at site where injected -signs of infection - fever or chills, cough, sore throat, pain or difficulty passing urine -signs of decreased platelets or bleeding - bruising, pinpoint red spots on the skin, black, tarry stools, blood in the urine -swelling of the ankles, feet, hands -tiredness -weakness Side effects that usually do not require medical attention (report to your doctor or health care professional if they continue or are bothersome): -diarrhea -hair loss -mouth sores -nail discoloration or damage -nausea -red colored urine -vomiting This list may not describe all possible side effects. Call your doctor for medical advice about side effects. You may report side effects to FDA at 1-800-FDA-1088. Where should I keep my medicine? This drug is given in a hospital or clinic and will not be stored at home. NOTE: This sheet is a summary. It may not cover all possible information. If you have questions about this medicine, talk to your doctor, pharmacist, or health care provider.  2018 Elsevier/Gold Standard (2015-07-08 11:28:51)  Cyclophosphamide injection What is this medicine? CYCLOPHOSPHAMIDE (sye kloe FOSS fa mide) is a chemotherapy drug. It slows the growth of cancer cells. This medicine is used to treat many types of cancer like lymphoma, myeloma,   leukemia, breast cancer, and ovarian cancer, to name a few. This medicine may be used for other purposes; ask your health care provider or pharmacist if you have questions. COMMON BRAND NAME(S): Cytoxan, Neosar What should I tell my health care provider before I take this medicine? They need to know if you have any of these conditions: -blood disorders -history of other chemotherapy -infection -kidney disease -liver disease -recent or ongoing radiation therapy -tumors in the bone marrow -an unusual or  allergic reaction to cyclophosphamide, other chemotherapy, other medicines, foods, dyes, or preservatives -pregnant or trying to get pregnant -breast-feeding How should I use this medicine? This drug is usually given as an injection into a vein or muscle or by infusion into a vein. It is administered in a hospital or clinic by a specially trained health care professional. Talk to your pediatrician regarding the use of this medicine in children. Special care may be needed. Overdosage: If you think you have taken too much of this medicine contact a poison control center or emergency room at once. NOTE: This medicine is only for you. Do not share this medicine with others. What if I miss a dose? It is important not to miss your dose. Call your doctor or health care professional if you are unable to keep an appointment. What may interact with this medicine? This medicine may interact with the following medications: -amiodarone -amphotericin B -azathioprine -certain antiviral medicines for HIV or AIDS such as protease inhibitors (e.g., indinavir, ritonavir) and zidovudine -certain blood pressure medications such as benazepril, captopril, enalapril, fosinopril, lisinopril, moexipril, monopril, perindopril, quinapril, ramipril, trandolapril -certain cancer medications such as anthracyclines (e.g., daunorubicin, doxorubicin), busulfan, cytarabine, paclitaxel, pentostatin, tamoxifen, trastuzumab -certain diuretics such as chlorothiazide, chlorthalidone, hydrochlorothiazide, indapamide, metolazone -certain medicines that treat or prevent blood clots like warfarin -certain muscle relaxants such as succinylcholine -cyclosporine -etanercept -indomethacin -medicines to increase blood counts like filgrastim, pegfilgrastim, sargramostim -medicines used as general anesthesia -metronidazole -natalizumab This list may not describe all possible interactions. Give your health care provider a list of all the  medicines, herbs, non-prescription drugs, or dietary supplements you use. Also tell them if you smoke, drink alcohol, or use illegal drugs. Some items may interact with your medicine. What should I watch for while using this medicine? Visit your doctor for checks on your progress. This drug may make you feel generally unwell. This is not uncommon, as chemotherapy can affect healthy cells as well as cancer cells. Report any side effects. Continue your course of treatment even though you feel ill unless your doctor tells you to stop. Drink water or other fluids as directed. Urinate often, even at night. In some cases, you may be given additional medicines to help with side effects. Follow all directions for their use. Call your doctor or health care professional for advice if you get a fever, chills or sore throat, or other symptoms of a cold or flu. Do not treat yourself. This drug decreases your body's ability to fight infections. Try to avoid being around people who are sick. This medicine may increase your risk to bruise or bleed. Call your doctor or health care professional if you notice any unusual bleeding. Be careful brushing and flossing your teeth or using a toothpick because you may get an infection or bleed more easily. If you have any dental work done, tell your dentist you are receiving this medicine. You may get drowsy or dizzy. Do not drive, use machinery, or do anything that needs mental alertness until   you know how this medicine affects you. Do not become pregnant while taking this medicine or for 1 year after stopping it. Women should inform their doctor if they wish to become pregnant or think they might be pregnant. Men should not father a child while taking this medicine and for 4 months after stopping it. There is a potential for serious side effects to an unborn child. Talk to your health care professional or pharmacist for more information. Do not breast-feed an infant while taking  this medicine. This medicine may interfere with the ability to have a child. This medicine has caused ovarian failure in some women. This medicine has caused reduced sperm counts in some men. You should talk with your doctor or health care professional if you are concerned about your fertility. If you are going to have surgery, tell your doctor or health care professional that you have taken this medicine. What side effects may I notice from receiving this medicine? Side effects that you should report to your doctor or health care professional as soon as possible: -allergic reactions like skin rash, itching or hives, swelling of the face, lips, or tongue -low blood counts - this medicine may decrease the number of white blood cells, red blood cells and platelets. You may be at increased risk for infections and bleeding. -signs of infection - fever or chills, cough, sore throat, pain or difficulty passing urine -signs of decreased platelets or bleeding - bruising, pinpoint red spots on the skin, black, tarry stools, blood in the urine -signs of decreased red blood cells - unusually weak or tired, fainting spells, lightheadedness -breathing problems -dark urine -dizziness -palpitations -swelling of the ankles, feet, hands -trouble passing urine or change in the amount of urine -weight gain -yellowing of the eyes or skin Side effects that usually do not require medical attention (report to your doctor or health care professional if they continue or are bothersome): -changes in nail or skin color -hair loss -missed menstrual periods -mouth sores -nausea, vomiting This list may not describe all possible side effects. Call your doctor for medical advice about side effects. You may report side effects to FDA at 1-800-FDA-1088. Where should I keep my medicine? This drug is given in a hospital or clinic and will not be stored at home. NOTE: This sheet is a summary. It may not cover all possible  information. If you have questions about this medicine, talk to your doctor, pharmacist, or health care provider.  2018 Elsevier/Gold Standard (2012-03-25 16:22:58)  

## 2017-08-27 ENCOUNTER — Inpatient Hospital Stay (HOSPITAL_BASED_OUTPATIENT_CLINIC_OR_DEPARTMENT_OTHER): Payer: Medicare Other | Admitting: Medical

## 2017-08-27 ENCOUNTER — Telehealth: Payer: Self-pay | Admitting: *Deleted

## 2017-08-27 ENCOUNTER — Telehealth: Payer: Self-pay | Admitting: Medical Oncology

## 2017-08-27 VITALS — BP 148/61 | HR 79 | Temp 98.0°F | Resp 18 | Ht 63.0 in | Wt 141.5 lb

## 2017-08-27 DIAGNOSIS — G43919 Migraine, unspecified, intractable, without status migrainosus: Secondary | ICD-10-CM | POA: Diagnosis not present

## 2017-08-27 DIAGNOSIS — Z5111 Encounter for antineoplastic chemotherapy: Secondary | ICD-10-CM | POA: Diagnosis not present

## 2017-08-27 DIAGNOSIS — D709 Neutropenia, unspecified: Secondary | ICD-10-CM | POA: Diagnosis not present

## 2017-08-27 DIAGNOSIS — C50412 Malignant neoplasm of upper-outer quadrant of left female breast: Secondary | ICD-10-CM

## 2017-08-27 DIAGNOSIS — Z171 Estrogen receptor negative status [ER-]: Secondary | ICD-10-CM

## 2017-08-27 DIAGNOSIS — M810 Age-related osteoporosis without current pathological fracture: Secondary | ICD-10-CM | POA: Diagnosis not present

## 2017-08-27 DIAGNOSIS — Z86000 Personal history of in-situ neoplasm of breast: Secondary | ICD-10-CM | POA: Diagnosis not present

## 2017-08-27 DIAGNOSIS — R51 Headache: Secondary | ICD-10-CM | POA: Diagnosis not present

## 2017-08-27 DIAGNOSIS — Z95828 Presence of other vascular implants and grafts: Secondary | ICD-10-CM

## 2017-08-27 MED ORDER — PROMETHAZINE HCL 25 MG/ML IJ SOLN
12.5000 mg | Freq: Once | INTRAMUSCULAR | Status: AC
  Administered 2017-08-27: 12.5 mg via INTRAVENOUS

## 2017-08-27 MED ORDER — PROMETHAZINE HCL 12.5 MG PO TABS: ORAL_TABLET | ORAL | 1 refills | Status: DC

## 2017-08-27 MED ORDER — HEPARIN SOD (PORK) LOCK FLUSH 100 UNIT/ML IV SOLN
500.0000 [IU] | Freq: Once | INTRAVENOUS | Status: AC
  Administered 2017-08-27: 500 [IU]
  Filled 2017-08-27: qty 5

## 2017-08-27 MED ORDER — KETOROLAC TROMETHAMINE 30 MG/ML IJ SOLN
INTRAMUSCULAR | Status: AC
  Filled 2017-08-27: qty 1

## 2017-08-27 MED ORDER — KETOROLAC TROMETHAMINE 30 MG/ML IJ SOLN
30.0000 mg | Freq: Once | INTRAMUSCULAR | Status: AC
  Administered 2017-08-27: 30 mg via INTRAVENOUS

## 2017-08-27 MED ORDER — PROMETHAZINE HCL 25 MG/ML IJ SOLN
INTRAMUSCULAR | Status: AC
  Filled 2017-08-27: qty 1

## 2017-08-27 MED ORDER — TRAMADOL HCL 50 MG PO TABS: ORAL_TABLET | ORAL | 0 refills | Status: DC

## 2017-08-27 MED ORDER — SODIUM CHLORIDE 0.9% FLUSH
10.0000 mL | Freq: Once | INTRAVENOUS | Status: AC
  Administered 2017-08-27: 10 mL
  Filled 2017-08-27: qty 10

## 2017-08-27 NOTE — Telephone Encounter (Signed)
Pt called with c/o "migraine that has gotten worse". Discussed with Dr. Jana Hakim nurse Val. Will call pt and have seen by Lucianne Lei, PA in symptom management.  Pt received Aloxi and Emend prior to chemo on 4/2

## 2017-08-27 NOTE — Telephone Encounter (Signed)
This RN called pt and informed her of need to come in for assessment by our [facility] provider with pt in agreement.  Note pt called on Wednesday stating onset of above- with recommendation for pt to use benadryl - and to call if not resolved.

## 2017-08-27 NOTE — Patient Instructions (Signed)
Migraine Headache A migraine headache is an intense, throbbing pain on one side or both sides of the head. Migraines may also cause other symptoms, such as nausea, vomiting, and sensitivity to light and noise. What are the causes? Doing or taking certain things may also trigger migraines, such as:  Alcohol.  Smoking.  Medicines, such as: ? Medicine used to treat chest pain (nitroglycerine). ? Birth control pills. ? Estrogen pills. ? Certain blood pressure medicines.  Aged cheeses, chocolate, or caffeine.  Foods or drinks that contain nitrates, glutamate, aspartame, or tyramine.  Physical activity.  Other things that may trigger a migraine include:  Menstruation.  Pregnancy.  Hunger.  Stress, lack of sleep, too much sleep, or fatigue.  Weather changes.  What increases the risk? The following factors may make you more likely to experience migraine headaches:  Age. Risk increases with age.  Family history of migraine headaches.  Being Caucasian.  Depression and anxiety.  Obesity.  Being a woman.  Having a hole in the heart (patent foramen ovale) or other heart problems.  What are the signs or symptoms? The main symptom of this condition is pulsating or throbbing pain. Pain may:  Happen in any area of the head, such as on one side or both sides.  Interfere with daily activities.  Get worse with physical activity.  Get worse with exposure to bright lights or loud noises.  Other symptoms may include:  Nausea.  Vomiting.  Dizziness.  General sensitivity to bright lights, loud noises, or smells.  Before you get a migraine, you may get warning signs that a migraine is developing (aura). An aura may include:  Seeing flashing lights or having blind spots.  Seeing bright spots, halos, or zigzag lines.  Having tunnel vision or blurred vision.  Having numbness or a tingling feeling.  Having trouble talking.  Having muscle weakness.  How is this  diagnosed? A migraine headache can be diagnosed based on:  Your symptoms.  A physical exam.  Tests, such as CT scan or MRI of the head. These imaging tests can help rule out other causes of headaches.  Taking fluid from the spine (lumbar puncture) and analyzing it (cerebrospinal fluid analysis, or CSF analysis).  How is this treated? A migraine headache is usually treated with medicines that:  Relieve pain.  Relieve nausea.  Prevent migraines from coming back.  Treatment may also include:  Acupuncture.  Lifestyle changes like avoiding foods that trigger migraines.  Follow these instructions at home: Medicines  Take over-the-counter and prescription medicines only as told by your health care provider.  Do not drive or use heavy machinery while taking prescription pain medicine.  To prevent or treat constipation while you are taking prescription pain medicine, your health care provider may recommend that you: ? Drink enough fluid to keep your urine clear or pale yellow. ? Take over-the-counter or prescription medicines. ? Eat foods that are high in fiber, such as fresh fruits and vegetables, whole grains, and beans. ? Limit foods that are high in fat and processed sugars, such as fried and sweet foods. Lifestyle  Avoid alcohol use.  Do not use any products that contain nicotine or tobacco, such as cigarettes and e-cigarettes. If you need help quitting, ask your health care provider.  Get at least 8 hours of sleep every night.  Limit your stress. General instructions   Keep a journal to find out what may trigger your migraine headaches. For example, write down: ? What you eat and   drink. ? How much sleep you get. ? Any change to your diet or medicines.  If you have a migraine: ? Avoid things that make your symptoms worse, such as bright lights. ? It may help to lie down in a dark, quiet room. ? Do not drive or use heavy machinery. ? Ask your health care provider  what activities are safe for you while you are experiencing symptoms.  Keep all follow-up visits as told by your health care provider. This is important. Contact a health care provider if:  You develop symptoms that are different or more severe than your usual migraine symptoms. Get help right away if:  Your migraine becomes severe.  You have a fever.  You have a stiff neck.  You have vision loss.  Your muscles feel weak or like you cannot control them.  You start to lose your balance often.  You develop trouble walking.  You faint. This information is not intended to replace advice given to you by your health care provider. Make sure you discuss any questions you have with your health care provider. Document Released: 05/11/2005 Document Revised: 11/29/2015 Document Reviewed: 10/28/2015 Elsevier Interactive Patient Education  2017 [employer]vier Inc.   

## 2017-08-27 NOTE — Telephone Encounter (Signed)
Husband left VM that pt has "terrible migraine" since Monday. This is the third one she has had in her life. ( other 2 managed at Urgent care a long time ago).  Symptoms include blurred eyes, dizzy feels like I have a " vice on head". She took 3 Advil yesterday which lightened it just a little and drank 1/2 Dr Malachi Bonds.  Today it is worse.  Please advise. 4/2 chemo w Baldomero Lamy and aloxi.

## 2017-08-30 NOTE — Progress Notes (Signed)
1   [facility] Progress Note   Stephanie Bautista [id number] Jun 20, 1950 67 y.o.  Stephanie Bautista is managed by Dr. Jana Hakim  Actively treated with chemotherapy: yes  Current Therapy: Cyclophosphamide and doxorubicin with Neulasta support.  Last Treated: 08/24/2017  Assessment: Plan:    Intractable migraine without status migrainosus, unspecified migraine type - Plan: promethazine (PHENERGAN) injection 12.5 mg, ketorolac (TORADOL) 30 MG/ML injection 30 mg, promethazine (PHENERGAN) 12.5 MG tablet, traMADol (ULTRAM) 50 MG tablet  Port-A-Cath in place - Plan: heparin lock flush 100 unit/mL, sodium chloride flush (NS) 0.9 % injection 10 mL  Malignant neoplasm of upper-outer quadrant of left breast in female, estrogen receptor negative ([place]) - Plan: heparin lock flush 100 unit/mL, sodium chloride flush (NS) 0.9 % injection 10 mL   Intractable migraine: The patient was given Phenergan 12.5 mg IV and Toradol 30 mg IV x1.  Additionally she was given a prescription for Phenergan 12.5 mg with instructions to use 1-2 tablets every 6 hours for nausea and headaches.  Additionally patient given a prescription for tramadol 50 mg, 1-2 tablets every 6 hours as needed for pain.  The patient's headache resolved after receiving Phenergan and Toradol.  Urine negative malignant neoplasm of the upper outer quadrant of the left breast: The patient continues to be managed by Dr. Jana Hakim and is status post cycle 1 of cyclophosphamide and doxorubicin.  She will see him in follow-up on 09/01/2017.  Please see After Visit Summary for patient specific instructions.  Future Appointments  Date Time Provider [place]  09/01/2017  1:15 PM [facility] LAB 6 [facility] None  09/01/2017  1:30 PM [facility] INJ NURSE [facility] None  09/01/2017  2:00 PM Magrinat, Virgie Dad, MD [facility] None  09/06/2017  3:45 PM [facility] LAB 1 [facility] None  09/06/2017  4:00 PM [facility] INJ NURSE  [facility] None  09/07/2017  9:30 AM [facility] D12 [facility] None  09/20/2017  3:45 PM [facility] LAB 1 [facility] None  09/20/2017  4:00 PM [facility] INJ NURSE [facility] None  09/21/2017  9:30 AM [facility] F20 [facility] None  10/04/2017  3:45 PM [facility] LAB 1 [facility] None  10/04/2017  4:00 PM [facility] INJ NURSE [facility] None  10/05/2017  9:30 AM [facility] C8 [facility] None    No orders of the defined types were placed in this encounter.      Subjective:   Patient ID:  [name] is a 67 y.o. (DOB September 14, 1950) female.  Chief Complaint:  Chief Complaint  Patient presents with  . Migraine    HPI Stephanie Bautista is a 67 year old female with an ER negative left breast cancer who is status post cycle 1 of cyclophosphamide and doxorubicin dosed on 08/24/2017 in the direction of Dr. Jana Hakim.  She reports having at least a 3-4-day history of a headache with blurred vision and photophobia since her chemotherapy.  She has taken ibuprofen without benefit.  She denies fevers, chills, sweats, nausea, vomiting, or dizziness.  She has never had a history of migraines.  She is concerned that this could be related to [place].  Medications: I have reviewed the patient's current medications.  Allergies:  Allergies  Allergen Reactions  . Amoxicillin Itching and Rash    Past Medical History:  Diagnosis Date  . Acoustic neuroma ([place])   . Adenomatous colon polyp 12/2002  . Arthritis   . Breast cancer ([place]) 1996   right breast cancer in 1996, left breast cancer in 2019  . Cholelithiases   . Esophageal stricture   .  Family history of breast cancer   . Family history of ovarian cancer   . Family history of pancreatic cancer   . Family history of prostate cancer   . GERD (gastroesophageal reflux disease)   . Hemorrhoids   . Hiatal hernia   . History of colon polyps   . Inguinal hernia   . UTI (lower urinary tract infection)     Past Surgical History:  Procedure  Laterality Date  . BREAST LUMPECTOMY    . CESAREAN SECTION     2x  . CHOLECYSTECTOMY  2008  . HEMORRHOID SURGERY  2007  . INGUINAL HERNIA REPAIR  1968  . PARTIAL HYSTERECTOMY  1996  . PORTACATH PLACEMENT Right 08/05/2017   Procedure: INSERTION PORT-A-CATH WITH [place];  Surgeon: Rolm Bookbinder, MD;  Location: [facility];  Service: General;  Laterality: Right;  . TONSILLECTOMY      Family History  Problem Relation Age of Onset  . Heart disease Mother   . Melanoma Mother        dx in her 45s  . Ovarian cancer Mother 65  . Heart disease Father   . Diabetes Father   . Stroke Father   . Ovarian cancer Sister 62  . Pancreatic cancer Maternal Aunt   . Bone cancer Maternal Uncle   . Breast cancer Paternal Aunt   . Non-Hodgkin's lymphoma Maternal Grandmother   . Heart disease Maternal Grandfather   . Heart disease Paternal Grandmother   . Prostate cancer Paternal Grandfather   . Stroke Maternal Uncle   . Bone cancer Maternal Uncle   . Cancer Maternal Aunt        NOS  . Breast cancer Cousin        daughter of mat aunt with NOS cancer    Social History   Socioeconomic History  . Marital status: Married    Spouse name: Not on file  . Number of children: 2  . Years of education: Not on file  . Highest education level: Not on file  Occupational History  . Occupation: Office manager: [employer]  Social Needs  . Financial resource strain: Not on file  . Food insecurity:    Worry: Not on file    Inability: Not on file  . Transportation needs:    Medical: Not on file    Non-medical: Not on file  Tobacco Use  . Smoking status: Never Smoker  . Smokeless tobacco: Never Used  Substance and Sexual Activity  . Alcohol use: No  . Drug use: No  . Sexual activity: Not on file  Lifestyle  . Physical activity:    Days per week: Not on file    Minutes per session: Not on file  . Stress: Not on file  Relationships  . Social connections:    Talks on phone: Not  on file    Gets together: Not on file    Attends religious service: Not on file    Active member of club or organization: Not on file    Attends meetings of clubs or organizations: Not on file    Relationship status: Not on file  . Intimate partner violence:    Fear of current or ex partner: Not on file    Emotionally abused: Not on file    Physically abused: Not on file    Forced sexual activity: Not on file  Other Topics Concern  . Not on file  Social History Narrative  . Not on  file    Past Medical History, Surgical history, Social history, and Family history were reviewed and updated as appropriate.   Please see review of systems for further details on the patient's review from today.   Review of Systems:  Review of Systems  Constitutional: Negative for chills, diaphoresis and fever.  Eyes: Positive for photophobia and visual disturbance.  Respiratory: Negative for cough, chest tightness and shortness of breath.   Cardiovascular: Negative for chest pain and palpitations.  Neurological: Positive for headaches. Negative for dizziness, facial asymmetry, speech difficulty and light-headedness.    Objective:   Physical Exam:  BP (!) 148/61 (BP Location: Left Arm, Patient Position: Sitting)   Pulse 79   Temp 98 F (36.7 C) (Oral)   Resp 18   Ht 5\' 3"  (1.6 m)   Wt 141 lb 8 oz (64.2 kg)   SpO2 100%   BMI 25.07 kg/m  ECOG: 0  Physical Exam  Constitutional: No distress.  HENT:  Head: Normocephalic and atraumatic.  Cardiovascular: Normal rate, regular rhythm and normal heart sounds. Exam reveals no friction rub.  No murmur heard. Pulmonary/Chest: Effort normal and breath sounds normal. No stridor. No respiratory distress. She has no wheezes. She has no rales.  Musculoskeletal: She exhibits no edema.  Skin: Skin is warm and dry. No rash noted. She is not diaphoretic. No erythema. No pallor.  Psychiatric: She has a normal mood and affect. Her behavior is normal. Judgment  and thought content normal.    Lab Review:     Component Value Date/Time   NA 139 08/23/2017 1336   K 3.5 08/23/2017 1336   CL 107 08/23/2017 1336   CO2 25 08/23/2017 1336   GLUCOSE 138 08/23/2017 1336   BUN 10 08/23/2017 1336   CREATININE 0.75 08/23/2017 1336   CREATININE 0.81 07/27/2017 1007   CREATININE 0.69 11/16/2011 1054   CALCIUM 9.3 08/23/2017 1336   PROT 6.9 08/23/2017 1336   ALBUMIN 3.8 08/23/2017 1336   AST 14 08/23/2017 1336   AST 16 07/27/2017 1007   ALT 11 08/23/2017 1336   ALT 12 07/27/2017 1007   ALKPHOS 78 08/23/2017 1336   BILITOT 0.3 08/23/2017 1336   BILITOT 0.6 07/27/2017 1007   GFRNONAA >60 08/23/2017 1336   GFRNONAA >60 07/27/2017 1007   GFRAA >60 08/23/2017 1336   GFRAA >60 07/27/2017 1007       Component Value Date/Time   WBC 4.3 08/23/2017 1336   RBC 4.38 08/23/2017 1336   HGB 12.5 08/23/2017 1336   HCT 37.4 08/23/2017 1336   PLT 191 08/23/2017 1336   PLT 203 07/27/2017 1007   MCV 85.5 08/23/2017 1336   MCV 87.1 11/16/2011 1056   MCH 28.6 08/23/2017 1336   MCHC 33.4 08/23/2017 1336   RDW 13.4 08/23/2017 1336   LYMPHSABS 1.2 08/23/2017 1336   MONOABS 0.4 08/23/2017 1336   EOSABS 0.1 08/23/2017 1336   BASOSABS 0.0 08/23/2017 1336   -------------------------------  Imaging from last 24 hours (if applicable):  Radiology interpretation: Dg Chest Port 1 View  Result Date: 08/05/2017 CLINICAL DATA:  Status post right-sided catheter placement. EXAM: PORTABLE CHEST 1 VIEW COMPARISON:  12/27/2014 FINDINGS: The heart size and mediastinal contours are within normal limits. Right-sided approach port catheter is noted with tip in the mid SVC. Both lungs are clear. Pneumothorax. The visualized skeletal structures are unremarkable. IMPRESSION: New right port catheter with tip in the mid SVC.  No pneumothorax. Electronically Signed   By: Meredith Leeds.D.  On: 08/05/2017 14:09   Dg Fluoro Guide Cv Line-no Report  Result Date: 08/05/2017 Fluoroscopy  was utilized by the requesting physician.  No radiographic interpretation.

## 2017-08-31 NOTE — Progress Notes (Signed)
[place]  [phone] [phone]     ID: Stephanie Bautista DOB: January 13, 1951  MR#: [id number]  JYN#:[id number]  Patient Care Team: Haywood Pao, MD as PCP - General (Internal Medicine) Iya Hamed, Virgie Dad, MD as Consulting Physician (Oncology) Rolm Bookbinder, MD as Consulting Physician (General Surgery) Maisie Fus, MD as Consulting Physician (Obstetrics and Gynecology) Delrae Rend, MD as Consulting Physician (Endocrinology) Verner Chol, MD as Consulting Physician (Sports Medicine) Lavonia Dana, MD as Referring Physician (Otolaryngology) OTHER MD:  CHIEF COMPLAINT: Triple negative breast cancer  CURRENT TREATMENT: Neoadjuvant chemotherapy   HISTORY OF CURRENT ILLNESS: From the original intake note:  I saw Stephanie Bautista remotely for her history of ductal carcinoma in situ of the right breast, treated with lumpectomy 09/02/1994, with the pathology showing ([id number]) ductal carcinoma in situ.  There was no invasive component.  This was followed by radiation given between 09/21/94 and 11/04/94, with a total of 86578 centigray to the right breast and a total of 604 0 cGy to the tumor bed.  She notes that she didn't take any anti-estrogen therapy at that time.  More recently, Stephanie Bautista had routine screening mammography at [facility] on 07/16/2017 showing a possible abnormality in the left breast.  The breast density was category B.  She underwent left breast ultrasound on 07/16/2017 showing: a 1.5 cm irregular mass in the left breast upper outer quadrant posterior depth is consistent with carcinoma. A 6 mm irregular mass in the left breast upper outer quadrant posterior depth was also highly suggestive for malignancy. A lymph node in the left axillary tail was read as suspicious of malignancy.   Accordingly on 07/22/2017 she proceeded to biopsy of the left breast area in question as well as the suspicious left axillary lymph node. The pathology from this  procedure showed ([id number]): Lymph node, needle/core biopsy, left axilla with no evidence of carcinoma-- concordant. Breast, left, needle core biopsy, invasive ductal carcinoma, grade 3, prognostic indicators significant for: ER, 0% negative and PR, 0% negative. Proliferation marker Ki67 at 50%. HER2 negative signals ratio is 1.36, and the number per cell 1.70 .Breast, left, needle core biopsy, architectural distortion consistent with fibroadenoma--discordant.  She has a bilateral breast MRI scheduled for 07/28/2017.  The patient's subsequent history is as detailed below.  INTERVAL HISTORY: Stephanie Bautista returns today for a follow-up and treatment of her triple negative breast cancer accompanied by her husband.  Today is day 9 cycle 1 of 4 planned cycles of cyclophosphamide and doxorubicin, to be repeated every 14 days, and to be followed by weekly paclitaxel and carboplatin x12.   She receives OnPro on day 2.  She did have some bony aches and pains related to that.  She did take the Claritin as directed    REVIEW OF SYSTEMS: Stephanie Bautista reports intermittent headaches started almost immediately after treatment. She was seen on 08/27/2017 in the cancer center by Harle Stanford. PA-C. She was given tramadol for her pain, which helped to alleviate her pain. She also experienced nausea. She does not swallow pills well, which also does not help much. The last couple of days she has been doing better, except for some mild arthralgias. She took Advil prior to coming for her aches. She denies unusual visual changes, vomiting, or dizziness. There has been no unusual cough, phlegm production, or pleurisy. This been no change in bowel or bladder habits. She denies unexplained fatigue or unexplained weight loss, bleeding, rash, or fever. A detailed review of systems  was otherwise noncontributory.   PAST MEDICAL HISTORY: Past Medical History:  Diagnosis Date  . Acoustic neuroma ([place])   . Adenomatous colon polyp 12/2002  .  Arthritis   . Breast cancer ([place]) 1996   right breast cancer in 1996, left breast cancer in 2019  . Cholelithiases   . Esophageal stricture   . Family history of breast cancer   . Family history of ovarian cancer   . Family history of pancreatic cancer   . Family history of prostate cancer   . GERD (gastroesophageal reflux disease)   . Hemorrhoids   . Hiatal hernia   . History of colon polyps   . Inguinal hernia   . UTI (lower urinary tract infection)   As far as PMHx she has had intermittent back pain (spinal stenosis) x 2 years,  Osteoporosis, random migraines, early cataracts and early hear loss due to schwannoma.   PAST SURGICAL HISTORY: Past Surgical History:  Procedure Laterality Date  . BREAST LUMPECTOMY    . CESAREAN SECTION     2x  . CHOLECYSTECTOMY  2008  . HEMORRHOID SURGERY  2007  . INGUINAL HERNIA REPAIR  1968  . PARTIAL HYSTERECTOMY  1996  . PORTACATH PLACEMENT Right 08/05/2017   Procedure: INSERTION PORT-A-CATH WITH [place];  Surgeon: Rolm Bookbinder, MD;  Location: [facility];  Service: General;  Laterality: Right;  . TONSILLECTOMY      FAMILY HISTORY Family History  Problem Relation Age of Onset  . Heart disease Mother   . Melanoma Mother        dx in her 91s  . Ovarian cancer Mother 51  . Heart disease Father   . Diabetes Father   . Stroke Father   . Ovarian cancer Sister 26  . Pancreatic cancer Maternal Aunt   . Bone cancer Maternal Uncle   . Breast cancer Paternal Aunt   . Non-Hodgkin's lymphoma Maternal Grandmother   . Heart disease Maternal Grandfather   . Heart disease Paternal Grandmother   . Prostate cancer Paternal Grandfather   . Stroke Maternal Uncle   . Bone cancer Maternal Uncle   . Cancer Maternal Aunt        NOS  . Breast cancer Cousin        daughter of mat aunt with NOS cancer  Her mother died from CHF at age 28. Her father died from cardiac issues at age 30. She doesn't have any brothers, but has 3 sisters. She had  one sister and her mother with ovarian cancer. Her sister was age 45 when she was diagnosed with ovarian cancer and her mother was in her late 22's when she was diagnosed with ovarian cancer. Her sister was not tested genetically following this. Her maternal grandmother had non-hodgkins lymphoma. Her paternal grandfather had prostate cancer. Her paternal aunt had breast cancer. Her paternal aunt's daughter had breast cancer. She had several uncles that had prostate cancer that spread to the bones.   GYNECOLOGIC HISTORY:  No LMP recorded. Patient has had a hysterectomy. Menarche: 67 years old Age at first live birth: 67 years old [place] P2 LMP: hysterectomy at age 76 S/p Hysterectomy with USO (L)  SOCIAL HISTORY: She has been retired from Medical sales representative work since 05/26/2017. Her husband, Kasandra Knudsen has been retired from the [employer] for the past 9 years. Their son is Leroy Sea is 26 and a IT trainer. Their daughter, Adonis Brook lives in [facility], is 51 and works in child care at a  church. The patient has 6 grandchildren ( 2 boys and 4 girls). She is a Psychologist, forensic.      ADVANCED DIRECTIVES:    HEALTH MAINTENANCE: Social History   Tobacco Use  . Smoking status: Never Smoker  . Smokeless tobacco: Never Used  Substance Use Topics  . Alcohol use: No  . Drug use: No     Colonoscopy: Dr. Fuller Plan.   PAP: Dr Nori Riis  Bone density: at Dr. Verlon Au office/ osteoporosis.    Allergies  Allergen Reactions  . Amoxicillin Itching and Rash    Current Outpatient Medications  Medication Sig Dispense Refill  . acetaminophen (TYLENOL) 500 MG tablet Take 500 mg by mouth every 6 (six) hours as needed.    Marland Kitchen alum & mag hydroxide-simeth (MAALOX/MYLANTA) 200-200-20 MG/5ML suspension Take by mouth every 6 (six) hours as needed for indigestion or heartburn.    . baclofen (LIORESAL) 10 MG tablet Take 10 mg by mouth as needed for muscle spasms.    Marland Kitchen dexamethasone (DECADRON) 4 MG tablet Take 2 tablets by  mouth once a day on the day after chemotherapy and then take 2 tablets two times a day for 2 days. Take with food. 30 tablet 1  . lidocaine-prilocaine (EMLA) cream Apply to affected area once 30 g 3  . LORazepam (ATIVAN) 0.5 MG tablet Take 1 tablet (0.5 mg total) by mouth at bedtime as needed (Nausea or vomiting). 30 tablet 0  . omeprazole (PRILOSEC) 40 MG capsule Take 1 capsule (40 mg total) by mouth 2 (two) times daily. 60 capsule 11  . prochlorperazine (COMPAZINE) 10 MG tablet Take 1 tablet (10 mg total) by mouth every 6 (six) hours as needed (Nausea or vomiting). 30 tablet 1  . promethazine (PHENERGAN) 12.5 MG tablet 1-2 p.o. every 6 hours as needed for nausea or headache 45 tablet 1  . traMADol (ULTRAM) 50 MG tablet 1-2 tablets every 6 hours as needed for pain 30 tablet 0   No current facility-administered medications for this visit.     OBJECTIVE: Middle-aged white woman who appears well  Vitals:   09/01/17 1354  BP: 133/62  Pulse: 77  Resp: 18  Temp: 98.2 F (36.8 C)  SpO2: 100%     Body mass index is 25.01 kg/m.   Wt Readings from Last 3 Encounters:  09/01/17 141 lb 3.2 oz (64 kg)  08/27/17 141 lb 8 oz (64.2 kg)  08/13/17 143 lb 6.4 oz (65 kg)      ECOG FS:1 - Symptomatic but completely ambulatory  Sclerae unicteric, pupils round and equal Oropharynx clear and moist No cervical or supraclavicular adenopathy Lungs no rales or rhonchi Heart regular rate and rhythm Abd soft, nontender, positive bowel sounds MSK no focal spinal tenderness, no upper extremity lymphedema Neuro: nonfocal, well oriented, appropriate affect Breasts: She still having some discomfort in the inferior part of the right breast.  Otherwise I do not palpate any abnormal findings bilaterally and both axillae are benign    LAB RESULTS:  CMP     Component Value Date/Time   NA 138 09/01/2017 1316   K 3.8 09/01/2017 1316   CL 107 09/01/2017 1316   CO2 24 09/01/2017 1316   GLUCOSE 110 09/01/2017  1316   BUN 8 09/01/2017 1316   CREATININE 0.79 09/01/2017 1316   CREATININE 0.81 07/27/2017 1007   CREATININE 0.69 11/16/2011 1054   CALCIUM 8.6 09/01/2017 1316   PROT 6.0 (L) 09/01/2017 1316   ALBUMIN 3.4 (L) 09/01/2017 1316   AST  10 09/01/2017 1316   AST 16 07/27/2017 1007   ALT 9 09/01/2017 1316   ALT 12 07/27/2017 1007   ALKPHOS 83 09/01/2017 1316   BILITOT 0.2 09/01/2017 1316   BILITOT 0.6 07/27/2017 1007   GFRNONAA >60 09/01/2017 1316   GFRNONAA >60 07/27/2017 1007   GFRAA >60 09/01/2017 1316   GFRAA >60 07/27/2017 1007    No results found for: TOTALPROTELP, ALBUMINELP, A1GS, A2GS, BETS, BETA2SER, GAMS, MSPIKE, SPEI  No results found for: KPAFRELGTCHN, LAMBDASER, KAPLAMBRATIO  Lab Results  Component Value Date   WBC 4.6 09/01/2017   NEUTROABS 2.5 09/01/2017   HGB 11.0 (L) 09/01/2017   HCT 33.3 (L) 09/01/2017   MCV 86.5 09/01/2017   PLT 106 (L) 09/01/2017    '@LASTCHEMISTRY'$ @  No results found for: LABCA2  No components found for: RJJOAC166  No results for input(s): INR in the last 168 hours.  No results found for: LABCA2  No results found for: AYT016  No results found for: WFU932  No results found for: TFT732  No results found for: CA2729  No components found for: HGQUANT  No results found for: CEA1 / No results found for: CEA1   No results found for: AFPTUMOR  No results found for: CHROMOGRNA  No results found for: PSA1  Appointment on 09/01/2017  Component Date Value Ref Range Status  . Sodium 09/01/2017 138  136 - 145 mmol/L Final  . Potassium 09/01/2017 3.8  3.5 - 5.1 mmol/L Final  . Chloride 09/01/2017 107  98 - 109 mmol/L Final  . CO2 09/01/2017 24  22 - 29 mmol/L Final  . Glucose, Bld 09/01/2017 110  70 - 140 mg/dL Final  . BUN 09/01/2017 8  7 - 26 mg/dL Final  . Creatinine, Ser 09/01/2017 0.79  0.60 - 1.10 mg/dL Final  . Calcium 09/01/2017 8.6  8.4 - 10.4 mg/dL Final  . Total Protein 09/01/2017 6.0* 6.4 - 8.3 g/dL Final  . Albumin  09/01/2017 3.4* 3.5 - 5.0 g/dL Final  . AST 09/01/2017 10  5 - 34 U/L Final  . ALT 09/01/2017 9  0 - 55 U/L Final  . Alkaline Phosphatase 09/01/2017 83  40 - 150 U/L Final  . Total Bilirubin 09/01/2017 0.2  0.2 - 1.2 mg/dL Final  . GFR calc non Af Amer 09/01/2017 >60  >60 mL/min Final  . GFR calc Af Amer 09/01/2017 >60  >60 mL/min Final   Comment: (NOTE) The eGFR has been calculated using the CKD EPI equation. This calculation has not been validated in all clinical situations. eGFR's persistently <60 mL/min signify possible Chronic Kidney Disease.   Georgiann Hahn gap 09/01/2017 7  3 - 11 Final   Performed at [facility], [place] [address] [place], [place] [address]  . WBC 09/01/2017 4.6  3.9 - 10.3 K/uL Final  . RBC 09/01/2017 3.85  3.70 - 5.45 MIL/uL Final  . Hemoglobin 09/01/2017 11.0* 11.6 - 15.9 g/dL Final  . HCT 09/01/2017 33.3* 34.8 - 46.6 % Final  . MCV 09/01/2017 86.5  79.5 - 101.0 fL Final  . MCH 09/01/2017 28.6  25.1 - 34.0 pg Final  . MCHC 09/01/2017 33.0  31.5 - 36.0 g/dL Final  . RDW 09/01/2017 12.8  11.2 - 14.5 % Final  . Platelets 09/01/2017 106* 145 - 400 K/uL Final  . Neutrophils Relative % 09/01/2017 55  % Final  . Neutro Abs 09/01/2017 2.5  1.5 - 6.5 K/uL Final  . Lymphocytes Relative 09/01/2017 26  %  Final  . Lymphs Abs 09/01/2017 1.2  0.9 - 3.3 K/uL Final  . Monocytes Relative 09/01/2017 13  % Final  . Monocytes Absolute 09/01/2017 0.6  0.1 - 0.9 K/uL Final  . Eosinophils Relative 09/01/2017 5  % Final  . Eosinophils Absolute 09/01/2017 0.2  0.0 - 0.5 K/uL Final  . Basophils Relative 09/01/2017 1  % Final  . Basophils Absolute 09/01/2017 0.0  0.0 - 0.1 K/uL Final   Performed at [facility], [address] [place], [place] [address]    (this displays the last labs from the last 3 days)  No results found for: TOTALPROTELP, ALBUMINELP, A1GS, A2GS, BETS, BETA2SER, GAMS, MSPIKE, SPEI (this displays SPEP labs)  No  results found for: KPAFRELGTCHN, LAMBDASER, KAPLAMBRATIO (kappa/lambda light chains)  No results found for: HGBA, HGBA2QUANT, HGBFQUANT, HGBSQUAN (Hemoglobinopathy evaluation)   No results found for: LDH  No results found for: IRON, TIBC, IRONPCTSAT (Iron and TIBC)  No results found for: FERRITIN  Urinalysis    Component Value Date/Time   BILIRUBINUR neg 04/06/2014 1019   PROTEINUR neg 04/06/2014 1019   UROBILINOGEN 0.2 04/06/2014 1019   NITRITE neg 04/06/2014 1019   LEUKOCYTESUR Negative 04/06/2014 1019     STUDIES: Dg Chest Port 1 View  Result Date: 08/05/2017 CLINICAL DATA:  Status post right-sided catheter placement. EXAM: PORTABLE CHEST 1 VIEW COMPARISON:  12/27/2014 FINDINGS: The heart size and mediastinal contours are within normal limits. Right-sided approach port catheter is noted with tip in the mid SVC. Both lungs are clear. Pneumothorax. The visualized skeletal structures are unremarkable. IMPRESSION: New right port catheter with tip in the mid SVC.  No pneumothorax. Electronically Signed   By: Ashley Royalty M.D.   On: 08/05/2017 14:09   Dg Fluoro Guide Cv Line-no Report  Result Date: 08/05/2017 Fluoroscopy was utilized by the requesting physician.  No radiographic interpretation.     ELIGIBLE FOR AVAILABLE RESEARCH PROTOCOL: UPBEAT  ASSESSMENT: 67 y.o. [place] woman  (1) status post right lumpectomy 09/02/1994 for ductal carcinoma in situ  (a) status post adjuvant radiation 09/21/1994 through 11/04/1994, total 5040 centigrade to the breast and 604 0 cGy to the tumor bed  (b) did not receive adjuvant antiestrogens  (2) status post left breast upper outer quadrant biopsy 07/22/2017 for a clinical T1c pN0, stage IB invasive ductal carcinoma, grade 3, triple negative, with an MIB-1 of 50%.  (a) lymph node biopsy on the same day was negative for malignancy (concordant)  (b) a second area of architectural distortion was also biopsied, read as fibroadenoma  (discordant)  (c) a third area of architectural distortion has not yet been biopsied  (d) breast MRI scheduled for 07/28/2017 shows a clinical T2 N0 tumor  (3) genetics testing 08/02/2017 through the Multi-Gene Panel offered by [facility] found no deleterious mutations in ALK, APC, ATM, AXIN2,BAP1,  BARD1, BLM, BMPR1A, BRCA1, BRCA2, BRIP1, CASR, CDC73, CDH1, CDK4, CDKN1B, CDKN1C, CDKN2A (p14ARF), CDKN2A (p16INK4a), CEBPA, CHEK2, CTNNA1, DICER1, DIS3L2, EGFR (c.2369C>T, p.Thr790Met variant only), EPCAM (Deletion/duplication testing only), FH, FLCN, GATA2, GPC3, GREM1 (Promoter region deletion/duplication testing only), HOXB13 (c.251G>A, p.Gly84Glu), HRAS, KIT, MAX, MEN1, MET, MITF (c.952G>A, p.Glu318Lys variant only), MLH1, MSH2, MSH3, MSH6, MUTYH, NBN, NF1, NF2, NTHL1, PALB2, PDGFRA, PHOX2B, PMS2, POLD1, POLE, POT1, PRKAR1A, PTCH1, PTEN, RAD50, RAD51C, RAD51D, RB1, RECQL4, RET, RUNX1, SDHAF2, SDHA (sequence changes only), SDHB, SDHC, SDHD, SMAD4, SMARCA4, SMARCB1, SMARCE1, STK11, SUFU, TERT, TERT, TMEM127, TP53, TSC1, TSC2, VHL, WRN and WT1.  The report date is August 02, 2017.  (  a) WT1 c.332C>T VUS identified on the Multi-cancer gene panel.    (4) chemotherapy will consist of doxorubicin/ cyclophosphamide in dose dense fashion x4 starting 08/24/2017 followed by paclitaxel/ carboplatin weekly x12  (5) definitive surgery to follow  (6) adjuvant radiation to follow as appropriate  PLAN: Stephanie Bautista had a hard time with her first cycle of chemotherapy.  I think a lot of the problem may well be from the palonosetron which is known to cause headaches as well as constipation.  I am going to eliminate that drug and see if that takes care of those 2 problems.  Of course that puts her at risk for nausea.  I am going to substitute Phenergan in the premeds and also in the post meds and I gave her all other information in writing.  She had been encouraged because she had not did not lost her hair but she is going to lose  it within the next 2 weeks.  She is prepared for that.  She already has a wig that she will be using.  As far as the pain from the OnPro is concerned she is welcome to use nonsteroidals Tylenol Ultram or the Percocet that she has left over from her original surgery  She will return in 1 week for cycle 2 and then she will see me around day 8 or 9 from cycle 2 to make sure that it was better than cycle 1 and if not to make whatever changes are necessary to improve her experience.  She knows to call for any other problems that may develop before the next visit.  Talvin Christianson, Virgie Dad, MD  09/01/17 2:33 PM Medical Oncology and Hematology [facility] [address] [place], [place] [address] Tel. [phone]    Fax. [phone]    This document serves as a record of services personally performed by Chauncey Cruel, MD. It was created on his behalf by Margit Banda, a trained medical scribe. The creation of this record is based on the scribe's personal observations and the provider's statements to them.   I have reviewed the above documentation for accuracy and completeness, and I agree with the above.

## 2017-09-01 ENCOUNTER — Inpatient Hospital Stay (HOSPITAL_BASED_OUTPATIENT_CLINIC_OR_DEPARTMENT_OTHER): Payer: Medicare Other | Admitting: Oncology

## 2017-09-01 ENCOUNTER — Inpatient Hospital Stay: Payer: Medicare Other

## 2017-09-01 VITALS — BP 133/62 | HR 77 | Temp 98.2°F | Resp 18 | Ht 63.0 in | Wt 141.2 lb

## 2017-09-01 DIAGNOSIS — Z171 Estrogen receptor negative status [ER-]: Secondary | ICD-10-CM

## 2017-09-01 DIAGNOSIS — Z5111 Encounter for antineoplastic chemotherapy: Secondary | ICD-10-CM | POA: Diagnosis not present

## 2017-09-01 DIAGNOSIS — Z95828 Presence of other vascular implants and grafts: Secondary | ICD-10-CM

## 2017-09-01 DIAGNOSIS — Z86000 Personal history of in-situ neoplasm of breast: Secondary | ICD-10-CM

## 2017-09-01 DIAGNOSIS — D709 Neutropenia, unspecified: Secondary | ICD-10-CM | POA: Diagnosis not present

## 2017-09-01 DIAGNOSIS — C50412 Malignant neoplasm of upper-outer quadrant of left female breast: Secondary | ICD-10-CM

## 2017-09-01 DIAGNOSIS — M810 Age-related osteoporosis without current pathological fracture: Secondary | ICD-10-CM | POA: Diagnosis not present

## 2017-09-01 DIAGNOSIS — D0511 Intraductal carcinoma in situ of right breast: Secondary | ICD-10-CM

## 2017-09-01 DIAGNOSIS — R51 Headache: Secondary | ICD-10-CM

## 2017-09-01 LAB — COMPREHENSIVE METABOLIC PANEL
ALK PHOS: 83 U/L (ref 40–150)
ALT: 9 U/L (ref 0–55)
AST: 10 U/L (ref 5–34)
Albumin: 3.4 g/dL — ABNORMAL LOW (ref 3.5–5.0)
Anion gap: 7 (ref 3–11)
BUN: 8 mg/dL (ref 7–26)
CALCIUM: 8.6 mg/dL (ref 8.4–10.4)
CO2: 24 mmol/L (ref 22–29)
CREATININE: 0.79 mg/dL (ref 0.60–1.10)
Chloride: 107 mmol/L (ref 98–109)
GFR calc non Af Amer: >60 mL/min (ref >60)
Glucose, Bld: 110 mg/dL (ref 70–140)
Potassium: 3.8 mmol/L (ref 3.5–5.1)
SODIUM: 138 mmol/L (ref 136–145)
Total Bilirubin: 0.2 mg/dL (ref 0.2–1.2)
Total Protein: 6 g/dL — ABNORMAL LOW (ref 6.4–8.3)

## 2017-09-01 LAB — CBC WITH DIFFERENTIAL/PLATELET
BASOS PCT: 1 %
Basophils Absolute: 0 10*3/uL (ref 0.0–0.1)
EOS ABS: 0.2 10*3/uL (ref 0.0–0.5)
EOS PCT: 5 %
HCT: 33.3 % — ABNORMAL LOW (ref 34.8–46.6)
HEMOGLOBIN: 11 g/dL — AB (ref 11.6–15.9)
Lymphocytes Relative: 26 %
Lymphs Abs: 1.2 10*3/uL (ref 0.9–3.3)
MCH: 28.6 pg (ref 25.1–34.0)
MCHC: 33 g/dL (ref 31.5–36.0)
MCV: 86.5 fL (ref 79.5–101.0)
MONO ABS: 0.6 10*3/uL (ref 0.1–0.9)
MONOS PCT: 13 %
NEUTROS PCT: 55 %
Neutro Abs: 2.5 10*3/uL (ref 1.5–6.5)
Platelets: 106 10*3/uL — ABNORMAL LOW (ref 145–400)
RBC: 3.85 MIL/uL (ref 3.70–5.45)
RDW: 12.8 % (ref 11.2–14.5)
WBC: 4.6 10*3/uL (ref 3.9–10.3)

## 2017-09-01 MED ORDER — HEPARIN SOD (PORK) LOCK FLUSH 100 UNIT/ML IV SOLN
500.0000 [IU] | Freq: Once | INTRAVENOUS | Status: AC
  Administered 2017-09-01: 500 [IU]
  Filled 2017-09-01: qty 5

## 2017-09-01 MED ORDER — SODIUM CHLORIDE 0.9% FLUSH
10.0000 mL | Freq: Once | INTRAVENOUS | Status: AC
  Administered 2017-09-01: 10 mL
  Filled 2017-09-01: qty 10

## 2017-09-03 ENCOUNTER — Telehealth: Payer: Self-pay | Admitting: Oncology

## 2017-09-03 NOTE — Telephone Encounter (Signed)
Per 4/10 no los

## 2017-09-06 ENCOUNTER — Inpatient Hospital Stay: Payer: Medicare Other

## 2017-09-06 VITALS — BP 147/77 | HR 71 | Temp 97.8°F | Resp 20

## 2017-09-06 DIAGNOSIS — D0511 Intraductal carcinoma in situ of right breast: Secondary | ICD-10-CM

## 2017-09-06 DIAGNOSIS — Z86000 Personal history of in-situ neoplasm of breast: Secondary | ICD-10-CM | POA: Diagnosis not present

## 2017-09-06 DIAGNOSIS — R51 Headache: Secondary | ICD-10-CM | POA: Diagnosis not present

## 2017-09-06 DIAGNOSIS — C50412 Malignant neoplasm of upper-outer quadrant of left female breast: Secondary | ICD-10-CM | POA: Diagnosis not present

## 2017-09-06 DIAGNOSIS — D709 Neutropenia, unspecified: Secondary | ICD-10-CM | POA: Diagnosis not present

## 2017-09-06 DIAGNOSIS — M810 Age-related osteoporosis without current pathological fracture: Secondary | ICD-10-CM | POA: Diagnosis not present

## 2017-09-06 DIAGNOSIS — Z171 Estrogen receptor negative status [ER-]: Secondary | ICD-10-CM

## 2017-09-06 DIAGNOSIS — Z5111 Encounter for antineoplastic chemotherapy: Secondary | ICD-10-CM | POA: Diagnosis not present

## 2017-09-06 DIAGNOSIS — Z95828 Presence of other vascular implants and grafts: Secondary | ICD-10-CM

## 2017-09-06 LAB — CBC WITH DIFFERENTIAL/PLATELET
BASOS PCT: 1 %
Basophils Absolute: 0.1 10*3/uL (ref 0.0–0.1)
EOS ABS: 0 10*3/uL (ref 0.0–0.5)
Eosinophils Relative: 0 %
HCT: 34.3 % — ABNORMAL LOW (ref 34.8–46.6)
Hemoglobin: 11.2 g/dL — ABNORMAL LOW (ref 11.6–15.9)
Lymphocytes Relative: 14 %
Lymphs Abs: 1.5 10*3/uL (ref 0.9–3.3)
MCH: 28.6 pg (ref 25.1–34.0)
MCHC: 32.7 g/dL (ref 31.5–36.0)
MCV: 87.7 fL (ref 79.5–101.0)
MONO ABS: 1.2 10*3/uL — AB (ref 0.1–0.9)
Monocytes Relative: 11 %
Neutro Abs: 8.1 10*3/uL — ABNORMAL HIGH (ref 1.5–6.5)
Neutrophils Relative %: 74 %
Platelets: 125 10*3/uL — ABNORMAL LOW (ref 145–400)
RBC: 3.91 MIL/uL (ref 3.70–5.45)
RDW: 13.1 % (ref 11.2–14.5)
WBC: 10.9 10*3/uL — ABNORMAL HIGH (ref 3.9–10.3)

## 2017-09-06 LAB — COMPREHENSIVE METABOLIC PANEL
ALBUMIN: 3.6 g/dL (ref 3.5–5.0)
ALT: 11 U/L (ref 0–55)
ANION GAP: 7 (ref 3–11)
AST: 12 U/L (ref 5–34)
Alkaline Phosphatase: 83 U/L (ref 40–150)
BUN: 11 mg/dL (ref 7–26)
CO2: 25 mmol/L (ref 22–29)
Calcium: 8.7 mg/dL (ref 8.4–10.4)
Chloride: 109 mmol/L (ref 98–109)
Creatinine, Ser: 0.79 mg/dL (ref 0.60–1.10)
GFR calc Af Amer: >60 mL/min (ref >60)
GFR calc non Af Amer: >60 mL/min (ref >60)
Glucose, Bld: 98 mg/dL (ref 70–140)
POTASSIUM: 4.1 mmol/L (ref 3.5–5.1)
Sodium: 141 mmol/L (ref 136–145)
TOTAL PROTEIN: 6.4 g/dL (ref 6.4–8.3)

## 2017-09-06 MED ORDER — SODIUM CHLORIDE 0.9% FLUSH
10.0000 mL | Freq: Once | INTRAVENOUS | Status: AC
  Administered 2017-09-06: 10 mL
  Filled 2017-09-06: qty 10

## 2017-09-06 MED ORDER — HEPARIN SOD (PORK) LOCK FLUSH 100 UNIT/ML IV SOLN
250.0000 [IU] | Freq: Once | INTRAVENOUS | Status: AC
  Administered 2017-09-06: 250 [IU]
  Filled 2017-09-06: qty 5

## 2017-09-06 NOTE — Patient Instructions (Signed)
Implanted Port Home Guide An implanted port is a type of central line that is placed under the skin. Central lines are used to provide IV access when treatment or nutrition needs to be given through a person's veins. Implanted ports are used for long-term IV access. An implanted port may be placed because:  You need IV medicine that would be irritating to the small veins in your hands or arms.  You need long-term IV medicines, such as antibiotics.  You need IV nutrition for a long period.  You need frequent blood draws for lab tests.  You need dialysis.  Implanted ports are usually placed in the chest area, but they can also be placed in the upper arm, the abdomen, or the leg. An implanted port has two main parts:  Reservoir. The reservoir is round and will appear as a small, raised area under your skin. The reservoir is the part where a needle is inserted to give medicines or draw blood.  Catheter. The catheter is a thin, flexible tube that extends from the reservoir. The catheter is placed into a large vein. Medicine that is inserted into the reservoir goes into the catheter and then into the vein.  How will I care for my incision site? Do not get the incision site wet. Bathe or shower as directed by your health care provider. How is my port accessed? Special steps must be taken to access the port:  Before the port is accessed, a numbing cream can be placed on the skin. This helps numb the skin over the port site.  Your health care provider uses a sterile technique to access the port. ? Your health care provider must put on a mask and sterile gloves. ? The skin over your port is cleaned carefully with an antiseptic and allowed to dry. ? The port is gently pinched between sterile gloves, and a needle is inserted into the port.  Only "non-coring" port needles should be used to access the port. Once the port is accessed, a blood return should be checked. This helps ensure that the port  is in the vein and is not clogged.  If your port needs to remain accessed for a constant infusion, a clear (transparent) bandage will be placed over the needle site. The bandage and needle will need to be changed every week, or as directed by your health care provider.  Keep the bandage covering the needle clean and dry. Do not get it wet. Follow your health care provider's instructions on how to take a shower or bath while the port is accessed.  If your port does not need to stay accessed, no bandage is needed over the port.  What is flushing? Flushing helps keep the port from getting clogged. Follow your health care provider's instructions on how and when to flush the port. Ports are usually flushed with saline solution or a medicine called heparin. The need for flushing will depend on how the port is used.  If the port is used for intermittent medicines or blood draws, the port will need to be flushed: ? After medicines have been given. ? After blood has been drawn. ? As part of routine maintenance.  If a constant infusion is running, the port may not need to be flushed.  How long will my port stay implanted? The port can stay in for as long as your health care provider thinks it is needed. When it is time for the port to come out, surgery will be   done to remove it. The procedure is similar to the one performed when the port was put in. When should I seek immediate medical care? When you have an implanted port, you should seek immediate medical care if:  You notice a bad smell coming from the incision site.  You have swelling, redness, or drainage at the incision site.  You have more swelling or pain at the port site or the surrounding area.  You have a fever that is not controlled with medicine.  This information is not intended to replace advice given to you by your health care provider. Make sure you discuss any questions you have with your health care provider. Document  Released: 05/11/2005 Document Revised: 10/17/2015 Document Reviewed: 01/16/2013 Elsevier Interactive Patient Education  2017 Elsevier Inc.  

## 2017-09-07 ENCOUNTER — Ambulatory Visit: Payer: Medicare Other

## 2017-09-07 ENCOUNTER — Encounter: Payer: Self-pay | Admitting: *Deleted

## 2017-09-07 ENCOUNTER — Inpatient Hospital Stay: Payer: Medicare Other

## 2017-09-07 VITALS — BP 157/68 | HR 67 | Temp 98.0°F | Resp 18

## 2017-09-07 DIAGNOSIS — C50412 Malignant neoplasm of upper-outer quadrant of left female breast: Secondary | ICD-10-CM

## 2017-09-07 DIAGNOSIS — Z86000 Personal history of in-situ neoplasm of breast: Secondary | ICD-10-CM | POA: Diagnosis not present

## 2017-09-07 DIAGNOSIS — Z171 Estrogen receptor negative status [ER-]: Principal | ICD-10-CM

## 2017-09-07 DIAGNOSIS — M810 Age-related osteoporosis without current pathological fracture: Secondary | ICD-10-CM | POA: Diagnosis not present

## 2017-09-07 DIAGNOSIS — D709 Neutropenia, unspecified: Secondary | ICD-10-CM | POA: Diagnosis not present

## 2017-09-07 DIAGNOSIS — R51 Headache: Secondary | ICD-10-CM | POA: Diagnosis not present

## 2017-09-07 DIAGNOSIS — Z5111 Encounter for antineoplastic chemotherapy: Secondary | ICD-10-CM | POA: Diagnosis not present

## 2017-09-07 MED ORDER — SODIUM CHLORIDE 0.9% FLUSH
10.0000 mL | INTRAVENOUS | Status: DC | PRN
  Administered 2017-09-07: 10 mL
  Filled 2017-09-07: qty 10

## 2017-09-07 MED ORDER — PEGFILGRASTIM 6 MG/0.6ML ~~LOC~~ PSKT
6.0000 mg | PREFILLED_SYRINGE | Freq: Once | SUBCUTANEOUS | Status: AC
  Administered 2017-09-07: 6 mg via SUBCUTANEOUS

## 2017-09-07 MED ORDER — HEPARIN SOD (PORK) LOCK FLUSH 100 UNIT/ML IV SOLN
500.0000 [IU] | Freq: Once | INTRAVENOUS | Status: AC | PRN
  Administered 2017-09-07: 500 [IU]
  Filled 2017-09-07: qty 5

## 2017-09-07 MED ORDER — DOXORUBICIN HCL CHEMO IV INJECTION 2 MG/ML
60.0000 mg/m2 | Freq: Once | INTRAVENOUS | Status: AC
  Administered 2017-09-07: 102 mg via INTRAVENOUS
  Filled 2017-09-07: qty 51

## 2017-09-07 MED ORDER — SODIUM CHLORIDE 0.9 % IV SOLN
Freq: Once | INTRAVENOUS | Status: AC
  Administered 2017-09-07: 10:00:00 via INTRAVENOUS
  Filled 2017-09-07: qty 5

## 2017-09-07 MED ORDER — PROMETHAZINE HCL 25 MG/ML IJ SOLN
INTRAMUSCULAR | Status: AC
  Filled 2017-09-07: qty 1

## 2017-09-07 MED ORDER — SODIUM CHLORIDE 0.9 % IV SOLN
Freq: Once | INTRAVENOUS | Status: AC
  Administered 2017-09-07: 10:00:00 via INTRAVENOUS

## 2017-09-07 MED ORDER — PEGFILGRASTIM 6 MG/0.6ML ~~LOC~~ PSKT
PREFILLED_SYRINGE | SUBCUTANEOUS | Status: AC
  Filled 2017-09-07: qty 0.6

## 2017-09-07 MED ORDER — PROMETHAZINE HCL 25 MG/ML IJ SOLN
12.5000 mg | Freq: Four times a day (QID) | INTRAMUSCULAR | Status: DC | PRN
  Administered 2017-09-07: 12.5 mg via INTRAVENOUS

## 2017-09-07 MED ORDER — SODIUM CHLORIDE 0.9 % IV SOLN
600.0000 mg/m2 | Freq: Once | INTRAVENOUS | Status: AC
  Administered 2017-09-07: 1020 mg via INTRAVENOUS
  Filled 2017-09-07: qty 51

## 2017-09-07 NOTE — Patient Instructions (Signed)
[facility] Discharge Instructions for Patients Receiving Chemotherapy  Today you received the following chemotherapy agents Adriamycin and Cytoxan  To help prevent nausea and vomiting after your treatment, we encourage you to take your nausea medication as directed.  If you develop nausea and vomiting that is not controlled by your nausea medication, call the clinic.   BELOW ARE SYMPTOMS THAT SHOULD BE REPORTED IMMEDIATELY:  *FEVER GREATER THAN 100.5 F  *CHILLS WITH OR WITHOUT FEVER  NAUSEA AND VOMITING THAT IS NOT CONTROLLED WITH YOUR NAUSEA MEDICATION  *UNUSUAL SHORTNESS OF BREATH  *UNUSUAL BRUISING OR BLEEDING  TENDERNESS IN MOUTH AND THROAT WITH OR WITHOUT PRESENCE OF ULCERS  *URINARY PROBLEMS  *BOWEL PROBLEMS  UNUSUAL RASH Items with * indicate a potential emergency and should be followed up as soon as possible.  Feel free to call the clinic should you have any questions or concerns. The clinic phone number is [phone].  Please show the CHEMO ALERT CARD at check-in to the Emergency Department and triage nurse.   

## 2017-09-13 NOTE — Progress Notes (Signed)
Stephanie Bautista  Telephone:([phone]) [phone] Fax:(336) [phone]     ID: Stephanie Bautista DOB: 1950-05-31  MR#: [id number]  LFY#:[id number]  Patient Care Team: Haywood Pao, MD as PCP - General (Internal Medicine) Chais Fehringer, Virgie Dad, MD as Consulting Physician (Oncology) Rolm Bookbinder, MD as Consulting Physician (General Surgery) Maisie Fus, MD as Consulting Physician (Obstetrics and Gynecology) Delrae Rend, MD as Consulting Physician (Endocrinology) Verner Chol, MD as Consulting Physician (Sports Medicine) Lavonia Dana, MD as Referring Physician (Otolaryngology) OTHER MD:  CHIEF COMPLAINT: Triple negative breast cancer  CURRENT TREATMENT: Neoadjuvant chemotherapy   HISTORY OF CURRENT ILLNESS: From the original intake note:  I saw Stephanie Bautista remotely for her history of ductal carcinoma in situ of the right breast, treated with lumpectomy 09/02/1994, with the pathology showing ([id number]) ductal carcinoma in situ.  There was no invasive component.  This was followed by radiation given between 09/21/94 and 11/04/94, with a [place] 85277 centigray to the right breast and a total of 604 0 cGy to the tumor bed.  She notes that she didn't take any anti-estrogen therapy at that time.  More recently, Stephanie Bautista had routine screening mammography at [facility] on 07/16/2017 showing a possible abnormality in the left breast.  The breast density was category B.  She underwent left breast ultrasound on 07/16/2017 showing: a 1.5 cm irregular mass in the left breast upper outer quadrant posterior depth is consistent with carcinoma. A 6 mm irregular mass in the left breast upper outer quadrant posterior depth was also highly suggestive for malignancy. A lymph node in the left axillary tail was read as suspicious of malignancy.   Accordingly on 07/22/2017 she proceeded to biopsy of the left breast area in question as well as the suspicious left axillary lymph node. The pathology from this  procedure showed ([id number]): Lymph node, needle/core biopsy, left axilla with no evidence of carcinoma-- concordant. Breast, left, needle core biopsy, invasive ductal carcinoma, grade 3, prognostic indicators significant for: ER, 0% negative and PR, 0% negative. Proliferation marker Ki67 at 50%. HER2 negative signals ratio is 1.36, and the number per cell 1.70 .Breast, left, needle core biopsy, architectural distortion consistent with fibroadenoma--discordant.  She has a bilateral breast MRI scheduled for 07/28/2017.  The patient's subsequent history is as detailed below.  INTERVAL HISTORY: Stephanie Bautista returns today for a follow-up and treatment of her triple negative breast cancer accompanied by her husband. Today is day 9 of cycle 2 of cyclophosphamide and doxorubicin given every 14 days, to be followed by weekly paclitaxel/carboplatin x12 . She tolerates this fairly well. She experiences fatigue for about 3-4 days. On day 2, she feels nausea and constipated. She feels GERD and acid in her throat. She tries to drink a smoothie and eat something light for lunch, but she feels the acid in her throat into the night. She also has headaches. She denies vomiting and oral sores. She has a soft toothbrush to aid with gum sensitivity.   She also recieves Neulasta OnPro on day 2. She tolerates this well.     REVIEW OF SYSTEMS: Stephanie Bautista reports that for exercise, she was able to ride her bike 9 miles. She denies unusual headaches, visual changes, vomiting, or dizziness. There has been no unusual cough, phlegm production, or pleurisy. This been no change in bowel or bladder habits. She denies unexplained fatigue or unexplained weight loss, bleeding, or fever. A detailed review of systems was otherwise stable.    PAST MEDICAL HISTORY: Past Medical History:  Diagnosis Date  . Acoustic neuroma ([place])   . Adenomatous colon polyp 12/2002  . Arthritis   . Breast cancer ([place]) 1996   right breast cancer in 1996, left breast  cancer in 2019  . Cholelithiases   . Esophageal stricture   . Family history of breast cancer   . Family history of ovarian cancer   . Family history of pancreatic cancer   . Family history of prostate cancer   . GERD (gastroesophageal reflux disease)   . Hemorrhoids   . Hiatal hernia   . History of colon polyps   . Inguinal hernia   . UTI (lower urinary tract infection)   As far as PMHx she has had intermittent back pain (spinal stenosis) x 2 years,  Osteoporosis, random migraines, early cataracts and early hear loss due to schwannoma.   PAST SURGICAL HISTORY: Past Surgical History:  Procedure Laterality Date  . BREAST LUMPECTOMY    . CESAREAN SECTION     2x  . CHOLECYSTECTOMY  2008  . HEMORRHOID SURGERY  2007  . INGUINAL HERNIA REPAIR  1968  . PARTIAL HYSTERECTOMY  1996  . PORTACATH PLACEMENT Right 08/05/2017   Procedure: INSERTION PORT-A-CATH WITH [place];  Surgeon: Rolm Bookbinder, MD;  Location: [place];  Service: General;  Laterality: Right;  . TONSILLECTOMY      FAMILY HISTORY Family History  Problem Relation Age of Onset  . Heart disease Mother   . Melanoma Mother        dx in her 73s  . Ovarian cancer Mother 34  . Heart disease Father   . Diabetes Father   . Stroke Father   . Ovarian cancer Sister 85  . Pancreatic cancer Maternal Aunt   . Bone cancer Maternal Uncle   . Breast cancer Paternal Aunt   . Non-Hodgkin's lymphoma Maternal Grandmother   . Heart disease Maternal Grandfather   . Heart disease Paternal Grandmother   . Prostate cancer Paternal Grandfather   . Stroke Maternal Uncle   . Bone cancer Maternal Uncle   . Cancer Maternal Aunt        NOS  . Breast cancer Cousin        daughter of mat aunt with NOS cancer  Her mother died from CHF at age 67. Her father died from cardiac issues at age 77. She doesn't have any brothers, but has 3 sisters. She had one sister and her mother with ovarian cancer. Her sister was age 20 when she was  diagnosed with ovarian cancer and her mother was in her late 51's when she was diagnosed with ovarian cancer. Her sister was not tested genetically following this. Her maternal grandmother had non-hodgkins lymphoma. Her paternal grandfather had prostate cancer. Her paternal aunt had breast cancer. Her paternal aunt's daughter had breast cancer. She had several uncles that had prostate cancer that spread to the bones.   GYNECOLOGIC HISTORY:  No LMP recorded. Patient has had a hysterectomy. Menarche: 67 years old Age at first live birth: 67 years old [place] P2 LMP: hysterectomy at age 44 S/p Hysterectomy with USO (L)  SOCIAL HISTORY: She has been retired from Medical sales representative work since 05/26/2017. Her husband, Kasandra Knudsen has been retired from the [employer] for the past 9 years. Their son is Leroy Sea is 91 and a IT trainer. Their daughter, Adonis Brook lives in [facility], is 2 and works in child care at [employer]. The patient has 6 grandchildren ( 2 boys and 4 girls).  She is a Psychologist, forensic.      ADVANCED DIRECTIVES:    HEALTH MAINTENANCE: Social History   Tobacco Use  . Smoking status: Never Smoker  . Smokeless tobacco: Never Used  Substance Use Topics  . Alcohol use: No  . Drug use: No     Colonoscopy: Dr. Fuller Plan.   PAP: Dr Nori Riis  Bone density: at Dr. Verlon Au office/ osteoporosis.    Allergies  Allergen Reactions  . Amoxicillin Itching and Rash    Current Outpatient Medications  Medication Sig Dispense Refill  . acetaminophen (TYLENOL) 500 MG tablet Take 500 mg by mouth every 6 (six) hours as needed.    Marland Kitchen alum & mag hydroxide-simeth (MAALOX/MYLANTA) 200-200-20 MG/5ML suspension Take by mouth every 6 (six) hours as needed for indigestion or heartburn.    . baclofen (LIORESAL) 10 MG tablet Take 10 mg by mouth as needed for muscle spasms.    Marland Kitchen dexamethasone (DECADRON) 4 MG tablet Take 2 tablets by mouth once a day on the day after chemotherapy and then take 2 tablets two times a day  for 2 days. Take with food. 30 tablet 1  . lidocaine-prilocaine (EMLA) cream Apply to affected area once 30 g 3  . LORazepam (ATIVAN) 0.5 MG tablet Take 1 tablet (0.5 mg total) by mouth at bedtime as needed (Nausea or vomiting). 30 tablet 0  . omeprazole (PRILOSEC) 40 MG capsule Take 1 capsule (40 mg total) by mouth 2 (two) times daily. 60 capsule 11  . prochlorperazine (COMPAZINE) 10 MG tablet Take 1 tablet (10 mg total) by mouth every 6 (six) hours as needed (Nausea or vomiting). 30 tablet 1  . promethazine (PHENERGAN) 12.5 MG tablet 1-2 p.o. every 6 hours as needed for nausea or headache 45 tablet 1  . traMADol (ULTRAM) 50 MG tablet 1-2 tablets every 6 hours as needed for pain 30 tablet 0   No current facility-administered medications for this visit.     OBJECTIVE: Middle-aged white Bautista who appears stated age  67:   09/15/17 1258  BP: 133/62  Pulse: 71  Resp: 16  Temp: 98 F (36.7 C)  SpO2: 100%     Body mass index is 24.55 kg/m.   Wt Readings from Last 3 Encounters:  09/15/17 138 lb 9.6 oz (62.9 kg)  09/01/17 141 lb 3.2 oz (64 kg)  08/27/17 141 lb 8 oz (64.2 kg)      ECOG FS:1 - Symptomatic but completely ambulatory  Sclerae unicteric, EOMs intact Oropharynx clear and moist No cervical or supraclavicular adenopathy Lungs no rales or rhonchi Heart regular rate and rhythm Abd soft, nontender, positive bowel sounds MSK no focal spinal tenderness, no upper extremity lymphedema Neuro: nonfocal, well oriented, appropriate affect Breasts: No palpable abnormalities in either breast; both axilla are benign Skin: Scattered rash consistent with yeast involving the left inframammary fold but also some areas in the upper front chest   LAB RESULTS:  CMP     Component Value Date/Time   NA 141 09/06/2017 1544   K 4.1 09/06/2017 1544   CL 109 09/06/2017 1544   CO2 25 09/06/2017 1544   GLUCOSE 98 09/06/2017 1544   BUN 11 09/06/2017 1544   CREATININE 0.79 09/06/2017 1544     CREATININE 0.81 07/27/2017 1007   CREATININE 0.69 11/16/2011 1054   CALCIUM 8.7 09/06/2017 1544   PROT 6.4 09/06/2017 1544   ALBUMIN 3.6 09/06/2017 1544   AST 12 09/06/2017 1544   AST 16 07/27/2017 1007   ALT 11  09/06/2017 1544   ALT 12 07/27/2017 1007   ALKPHOS 83 09/06/2017 1544   BILITOT <0.2 (L) 09/06/2017 1544   BILITOT 0.6 07/27/2017 1007   GFRNONAA >60 09/06/2017 1544   GFRNONAA >60 07/27/2017 1007   GFRAA >60 09/06/2017 1544   GFRAA >60 07/27/2017 1007    No results found for: TOTALPROTELP, ALBUMINELP, A1GS, A2GS, BETS, BETA2SER, GAMS, MSPIKE, SPEI  No results found for: KPAFRELGTCHN, LAMBDASER, KAPLAMBRATIO  Lab Results  Component Value Date   WBC 10.9 (H) 09/06/2017   NEUTROABS 8.1 (H) 09/06/2017   HGB 11.2 (L) 09/06/2017   HCT 34.3 (L) 09/06/2017   MCV 87.7 09/06/2017   PLT 125 (L) 09/06/2017    '@LASTCHEMISTRY'$ @  No results found for: LABCA2  No components found for: [id number]  No results for input(s): INR in the last 168 hours.  No results found for: LABCA2  No results found for: WUJ811  No results found for: BJY782  No results found for: NFA213  No results found for: CA2729  No components found for: HGQUANT  No results found for: CEA1 / No results found for: CEA1   No results found for: AFPTUMOR  No results found for: CHROMOGRNA  No results found for: PSA1  No visits with results within 3 Day(s) from this visit.  Latest known visit with results is:  Appointment on 09/06/2017  Component Date Value Ref Range Status  . WBC 09/06/2017 10.9* 3.9 - 10.3 K/uL Final  . RBC 09/06/2017 3.91  3.70 - 5.45 MIL/uL Final  . Hemoglobin 09/06/2017 11.2* 11.6 - 15.9 g/dL Final  . HCT 09/06/2017 34.3* 34.8 - 46.6 % Final  . MCV 09/06/2017 87.7  79.5 - 101.0 fL Final  . MCH 09/06/2017 28.6  25.1 - 34.0 pg Final  . MCHC 09/06/2017 32.7  31.5 - 36.0 g/dL Final  . RDW 09/06/2017 13.1  11.2 - 14.5 % Final  . Platelets 09/06/2017 125* 145 - 400 K/uL Final   . Neutrophils Relative % 09/06/2017 74  % Final  . Neutro Abs 09/06/2017 8.1* 1.5 - 6.5 K/uL Final  . Lymphocytes Relative 09/06/2017 14  % Final  . Lymphs Abs 09/06/2017 1.5  0.9 - 3.3 K/uL Final  . Monocytes Relative 09/06/2017 11  % Final  . Monocytes Absolute 09/06/2017 1.2* 0.1 - 0.9 K/uL Final  . Eosinophils Relative 09/06/2017 0  % Final  . Eosinophils Absolute 09/06/2017 0.0  0.0 - 0.5 K/uL Final  . Basophils Relative 09/06/2017 1  % Final  . Basophils Absolute 09/06/2017 0.1  0.0 - 0.1 K/uL Final   Performed at [facility], [place] [address] [place], [place] [address]  . Sodium 09/06/2017 141  136 - 145 mmol/L Final  . Potassium 09/06/2017 4.1  3.5 - 5.1 mmol/L Final  . Chloride 09/06/2017 109  98 - 109 mmol/L Final  . CO2 09/06/2017 25  22 - 29 mmol/L Final  . Glucose, Bld 09/06/2017 98  70 - 140 mg/dL Final  . BUN 09/06/2017 11  7 - 26 mg/dL Final  . Creatinine, Ser 09/06/2017 0.79  0.60 - 1.10 mg/dL Final  . Calcium 09/06/2017 8.7  8.4 - 10.4 mg/dL Final  . Total Protein 09/06/2017 6.4  6.4 - 8.3 g/dL Final  . Albumin 09/06/2017 3.6  3.5 - 5.0 g/dL Final  . AST 09/06/2017 12  5 - 34 U/L Final  . ALT 09/06/2017 11  0 - 55 U/L Final  . Alkaline Phosphatase 09/06/2017 83  40 -  150 U/L Final  . Total Bilirubin 09/06/2017 <0.2* 0.2 - 1.2 mg/dL Final  . GFR calc non Af Amer 09/06/2017 >60  >60 mL/min Final  . GFR calc Af Amer 09/06/2017 >60  >60 mL/min Final   Comment: (NOTE) The eGFR has been calculated using the CKD EPI equation. This calculation has not been validated in all clinical situations. eGFR's persistently <60 mL/min signify possible Chronic Kidney Disease.   Georgiann Hahn gap 09/06/2017 7  3 - 11 Final   Performed at [facility], [place] [address] [place], [place] [address]    (this displays the last labs from the last 3 days)  No results found for: TOTALPROTELP, ALBUMINELP, A1GS, A2GS, BETS, BETA2SER, GAMS, MSPIKE,  SPEI (this displays SPEP labs)  No results found for: KPAFRELGTCHN, LAMBDASER, KAPLAMBRATIO (kappa/lambda light chains)  No results found for: HGBA, HGBA2QUANT, HGBFQUANT, HGBSQUAN (Hemoglobinopathy evaluation)   No results found for: LDH  No results found for: IRON, TIBC, IRONPCTSAT (Iron and TIBC)  No results found for: FERRITIN  Urinalysis    Component Value Date/Time   BILIRUBINUR neg 04/06/2014 1019   PROTEINUR neg 04/06/2014 1019   UROBILINOGEN 0.2 04/06/2014 1019   NITRITE neg 04/06/2014 1019   LEUKOCYTESUR Negative 04/06/2014 1019     STUDIES: No results found.   ELIGIBLE FOR AVAILABLE RESEARCH PROTOCOL: UPBEAT  ASSESSMENT: 39 y.o. Stephanie Bautista  (1) status post right lumpectomy 09/02/1994 for ductal carcinoma in situ  (a) status post adjuvant radiation 09/21/1994 through 11/04/1994, total 5040 centigrade to the breast and 604 0 cGy to the tumor bed  (b) did not receive adjuvant antiestrogens  (2) status post left breast upper outer quadrant biopsy 07/22/2017 for a clinical T1c pN0, stage IB invasive ductal carcinoma, grade 3, triple negative, with an MIB-1 of 50%.  (a) lymph node biopsy on the same day was negative for malignancy (concordant)  (b) a second area of architectural distortion was also biopsied, read as fibroadenoma (discordant)  (c) a third area of architectural distortion has not yet been biopsied  (d) breast MRI scheduled for 07/28/2017 shows a clinical T2 N0 tumor  (3) genetics testing 08/02/2017 through the Multi-Gene Panel offered by [facility] found no deleterious mutations in ALK, APC, ATM, AXIN2,BAP1,  BARD1, BLM, BMPR1A, BRCA1, BRCA2, BRIP1, CASR, CDC73, CDH1, CDK4, CDKN1B, CDKN1C, CDKN2A (p14ARF), CDKN2A (p16INK4a), CEBPA, CHEK2, CTNNA1, DICER1, DIS3L2, EGFR (c.2369C>T, p.Thr790Met variant only), EPCAM (Deletion/duplication testing only), FH, FLCN, GATA2, GPC3, GREM1 (Promoter region deletion/duplication testing only), HOXB13 (c.251G>A,  p.Gly84Glu), HRAS, KIT, MAX, MEN1, MET, MITF (c.952G>A, p.Glu318Lys variant only), MLH1, MSH2, MSH3, MSH6, MUTYH, NBN, NF1, NF2, NTHL1, PALB2, PDGFRA, PHOX2B, PMS2, POLD1, POLE, POT1, PRKAR1A, PTCH1, PTEN, RAD50, RAD51C, RAD51D, RB1, RECQL4, RET, RUNX1, SDHAF2, SDHA (sequence changes only), SDHB, SDHC, SDHD, SMAD4, SMARCA4, SMARCB1, SMARCE1, STK11, SUFU, TERT, TERT, TMEM127, TP53, TSC1, TSC2, VHL, WRN and WT1.  The report date is August 02, 2017.  (a) WT1 c.332C>T VUS identified on the Multi-cancer gene panel.    (4) chemotherapy will consist of doxorubicin/ cyclophosphamide in dose dense fashion x4 starting 08/24/2017 followed by paclitaxel/ carboplatin weekly x12  (5) definitive surgery to follow  (6) adjuvant radiation to follow as appropriate  PLAN: Stephanie Bautista did not have any better time the second cycle of chemotherapy then the first.  We are going to make some changes in her premeds including dropping the Decadron to 4 mg twice daily instead of 8 mg, and making the prochlorperazine as needed.  I am also putting her  on Diflucan now and then she will repeat this beginning on chemo day and continuing daily for 7 days with the next 2 cycles of chemotherapy.  We discussed taking a "week off" at this point.  I think that would be prudent.  She is appreciative of the possibility so she will not be treated a week from today but actually 2 weeks from today.  I have entered all those changes.  She is neutropenic.  I am putting her on Cipro for the next 5 days.  I am hopeful with these changes she will do better with the next 2 cycles.  Certainly life will get better after that.  She tells me she would have participated in the abatacept study except that she was called only the day before she was starting chemo and that was too rushed  She knows to call for any other issues that may develop before the next visit.  Stephanie Bautista, Virgie Dad, MD  09/15/17 1:24 PM Medical Oncology and Hematology [facility] [address] [place], [place] [address] Tel. [phone]    Fax. [phone]  This document serves as a record of services personally performed by Lurline Del, MD. It was created on his behalf by Sheron Nightingale, a trained medical scribe. The creation of this record is based on the scribe's personal observations and the provider's statements to them.   I have reviewed the above documentation for accuracy and completeness, and I agree with the above.

## 2017-09-15 ENCOUNTER — Inpatient Hospital Stay (HOSPITAL_BASED_OUTPATIENT_CLINIC_OR_DEPARTMENT_OTHER): Payer: Medicare Other | Admitting: Oncology

## 2017-09-15 ENCOUNTER — Encounter: Payer: Self-pay | Admitting: Medical Oncology

## 2017-09-15 ENCOUNTER — Inpatient Hospital Stay: Payer: Medicare Other

## 2017-09-15 ENCOUNTER — Telehealth: Payer: Self-pay | Admitting: Oncology

## 2017-09-15 VITALS — BP 133/62 | HR 71 | Temp 98.0°F | Resp 16 | Ht 63.0 in | Wt 138.6 lb

## 2017-09-15 DIAGNOSIS — C50412 Malignant neoplasm of upper-outer quadrant of left female breast: Secondary | ICD-10-CM | POA: Diagnosis not present

## 2017-09-15 DIAGNOSIS — D0511 Intraductal carcinoma in situ of right breast: Secondary | ICD-10-CM

## 2017-09-15 DIAGNOSIS — Z86000 Personal history of in-situ neoplasm of breast: Secondary | ICD-10-CM | POA: Diagnosis not present

## 2017-09-15 DIAGNOSIS — R51 Headache: Secondary | ICD-10-CM | POA: Diagnosis not present

## 2017-09-15 DIAGNOSIS — D709 Neutropenia, unspecified: Secondary | ICD-10-CM

## 2017-09-15 DIAGNOSIS — Z171 Estrogen receptor negative status [ER-]: Secondary | ICD-10-CM

## 2017-09-15 DIAGNOSIS — Z5111 Encounter for antineoplastic chemotherapy: Secondary | ICD-10-CM | POA: Diagnosis not present

## 2017-09-15 DIAGNOSIS — M810 Age-related osteoporosis without current pathological fracture: Secondary | ICD-10-CM

## 2017-09-15 LAB — CBC WITH DIFFERENTIAL/PLATELET
Basophils Absolute: 0 10*3/uL (ref 0.0–0.1)
Basophils Relative: 0 %
EOS PCT: 1 %
Eosinophils Absolute: 0 10*3/uL (ref 0.0–0.5)
HEMATOCRIT: 31.1 % — AB (ref 34.8–46.6)
HEMOGLOBIN: 10.3 g/dL — AB (ref 11.6–15.9)
LYMPHS ABS: 0.6 10*3/uL — AB (ref 0.9–3.3)
LYMPHS PCT: 37 %
MCH: 28.3 pg (ref 25.1–34.0)
MCHC: 33.1 g/dL (ref 31.5–36.0)
MCV: 85.4 fL (ref 79.5–101.0)
Monocytes Absolute: 0.5 10*3/uL (ref 0.1–0.9)
Monocytes Relative: 31 %
NEUTROS ABS: 0.5 10*3/uL — AB (ref 1.5–6.5)
NEUTROS PCT: 31 %
Platelets: 125 10*3/uL — ABNORMAL LOW (ref 145–400)
RBC: 3.64 MIL/uL — AB (ref 3.70–5.45)
RDW: 13.1 % (ref 11.2–14.5)
WBC: 1.6 10*3/uL — AB (ref 3.9–10.3)

## 2017-09-15 LAB — COMPREHENSIVE METABOLIC PANEL
ALK PHOS: 79 U/L (ref 40–150)
ALT: 10 U/L (ref 0–55)
AST: 8 U/L (ref 5–34)
Albumin: 3.6 g/dL (ref 3.5–5.0)
Anion gap: 6 (ref 3–11)
BUN: 6 mg/dL — AB (ref 7–26)
CALCIUM: 8.7 mg/dL (ref 8.4–10.4)
CO2: 25 mmol/L (ref 22–29)
CREATININE: 0.64 mg/dL (ref 0.60–1.10)
Chloride: 107 mmol/L (ref 98–109)
Glucose, Bld: 77 mg/dL (ref 70–140)
Potassium: 3.9 mmol/L (ref 3.5–5.1)
Sodium: 138 mmol/L (ref 136–145)
Total Bilirubin: <0.2 mg/dL — ABNORMAL LOW (ref 0.2–1.2)
Total Protein: 6.1 g/dL — ABNORMAL LOW (ref 6.4–8.3)

## 2017-09-15 MED ORDER — CIPROFLOXACIN HCL 500 MG PO TABS: ORAL_TABLET | ORAL | 1 refills | Status: DC

## 2017-09-15 MED ORDER — FLUCONAZOLE 100 MG PO TABS: ORAL_TABLET | ORAL | 2 refills | Status: DC

## 2017-09-15 NOTE — Telephone Encounter (Signed)
Gave patient AVs and calendar of upcoming may appointments.  °

## 2017-09-20 ENCOUNTER — Inpatient Hospital Stay: Payer: Medicare Other

## 2017-09-21 ENCOUNTER — Ambulatory Visit: Payer: Medicare Other

## 2017-09-27 ENCOUNTER — Inpatient Hospital Stay: Payer: Medicare Other | Attending: Oncology

## 2017-09-27 ENCOUNTER — Inpatient Hospital Stay: Payer: Medicare Other

## 2017-09-27 DIAGNOSIS — B370 Candidal stomatitis: Secondary | ICD-10-CM | POA: Diagnosis not present

## 2017-09-27 DIAGNOSIS — Z86000 Personal history of in-situ neoplasm of breast: Secondary | ICD-10-CM | POA: Insufficient documentation

## 2017-09-27 DIAGNOSIS — J0100 Acute maxillary sinusitis, unspecified: Secondary | ICD-10-CM | POA: Insufficient documentation

## 2017-09-27 DIAGNOSIS — C50412 Malignant neoplasm of upper-outer quadrant of left female breast: Secondary | ICD-10-CM | POA: Diagnosis not present

## 2017-09-27 DIAGNOSIS — M810 Age-related osteoporosis without current pathological fracture: Secondary | ICD-10-CM | POA: Insufficient documentation

## 2017-09-27 DIAGNOSIS — Z171 Estrogen receptor negative status [ER-]: Secondary | ICD-10-CM | POA: Diagnosis not present

## 2017-09-27 DIAGNOSIS — Z95828 Presence of other vascular implants and grafts: Secondary | ICD-10-CM

## 2017-09-27 DIAGNOSIS — D709 Neutropenia, unspecified: Secondary | ICD-10-CM | POA: Insufficient documentation

## 2017-09-27 DIAGNOSIS — K219 Gastro-esophageal reflux disease without esophagitis: Secondary | ICD-10-CM | POA: Diagnosis not present

## 2017-09-27 DIAGNOSIS — Z452 Encounter for adjustment and management of vascular access device: Secondary | ICD-10-CM | POA: Insufficient documentation

## 2017-09-27 DIAGNOSIS — R630 Anorexia: Secondary | ICD-10-CM | POA: Insufficient documentation

## 2017-09-27 DIAGNOSIS — Z5111 Encounter for antineoplastic chemotherapy: Secondary | ICD-10-CM | POA: Diagnosis not present

## 2017-09-27 DIAGNOSIS — Z5189 Encounter for other specified aftercare: Secondary | ICD-10-CM | POA: Diagnosis not present

## 2017-09-27 DIAGNOSIS — D0511 Intraductal carcinoma in situ of right breast: Secondary | ICD-10-CM

## 2017-09-27 LAB — CBC WITH DIFFERENTIAL/PLATELET
BASOS ABS: 0.1 10*3/uL (ref 0.0–0.1)
Basophils Relative: 1 %
Eosinophils Absolute: 0 10*3/uL (ref 0.0–0.5)
Eosinophils Relative: 0 %
HCT: 33.1 % — ABNORMAL LOW (ref 34.8–46.6)
HEMOGLOBIN: 11.1 g/dL — AB (ref 11.6–15.9)
LYMPHS ABS: 0.7 10*3/uL — AB (ref 0.9–3.3)
LYMPHS PCT: 10 %
MCH: 28.9 pg (ref 25.1–34.0)
MCHC: 33.4 g/dL (ref 31.5–36.0)
MCV: 86.4 fL (ref 79.5–101.0)
Monocytes Absolute: 0.9 10*3/uL (ref 0.1–0.9)
Monocytes Relative: 14 %
NEUTROS ABS: 5 10*3/uL (ref 1.5–6.5)
NEUTROS PCT: 75 %
Platelets: 233 10*3/uL (ref 145–400)
RBC: 3.83 MIL/uL (ref 3.70–5.45)
RDW: 15.2 % — ABNORMAL HIGH (ref 11.2–14.5)
WBC: 6.7 10*3/uL (ref 3.9–10.3)

## 2017-09-27 LAB — COMPREHENSIVE METABOLIC PANEL
ALBUMIN: 3.9 g/dL (ref 3.5–5.0)
ALT: 12 U/L (ref 0–55)
ANION GAP: 6 (ref 3–11)
AST: 14 U/L (ref 5–34)
Alkaline Phosphatase: 70 U/L (ref 40–150)
BILIRUBIN TOTAL: 0.3 mg/dL (ref 0.2–1.2)
BUN: 8 mg/dL (ref 7–26)
CHLORIDE: 108 mmol/L (ref 98–109)
CO2: 26 mmol/L (ref 22–29)
Calcium: 9.1 mg/dL (ref 8.4–10.4)
Creatinine, Ser: 0.71 mg/dL (ref 0.60–1.10)
GFR calc Af Amer: >60 mL/min (ref >60)
GLUCOSE: 108 mg/dL (ref 70–140)
POTASSIUM: 3.8 mmol/L (ref 3.5–5.1)
Sodium: 140 mmol/L (ref 136–145)
TOTAL PROTEIN: 6.6 g/dL (ref 6.4–8.3)

## 2017-09-27 MED ORDER — HEPARIN SOD (PORK) LOCK FLUSH 100 UNIT/ML IV SOLN
500.0000 [IU] | Freq: Once | INTRAVENOUS | Status: AC
  Administered 2017-09-27: 500 [IU]
  Filled 2017-09-27: qty 5

## 2017-09-27 MED ORDER — SODIUM CHLORIDE 0.9% FLUSH
10.0000 mL | Freq: Once | INTRAVENOUS | Status: AC
  Administered 2017-09-27: 10 mL
  Filled 2017-09-27: qty 10

## 2017-09-28 ENCOUNTER — Inpatient Hospital Stay: Payer: Medicare Other

## 2017-09-28 ENCOUNTER — Other Ambulatory Visit: Payer: Self-pay | Admitting: Medical

## 2017-09-28 ENCOUNTER — Inpatient Hospital Stay (HOSPITAL_BASED_OUTPATIENT_CLINIC_OR_DEPARTMENT_OTHER): Payer: Medicare Other | Admitting: Medical

## 2017-09-28 VITALS — BP 166/76 | HR 75 | Temp 99.1°F | Resp 17

## 2017-09-28 DIAGNOSIS — C50412 Malignant neoplasm of upper-outer quadrant of left female breast: Secondary | ICD-10-CM

## 2017-09-28 DIAGNOSIS — Z171 Estrogen receptor negative status [ER-]: Secondary | ICD-10-CM | POA: Diagnosis not present

## 2017-09-28 DIAGNOSIS — J0100 Acute maxillary sinusitis, unspecified: Secondary | ICD-10-CM | POA: Diagnosis not present

## 2017-09-28 DIAGNOSIS — B370 Candidal stomatitis: Secondary | ICD-10-CM | POA: Diagnosis not present

## 2017-09-28 DIAGNOSIS — Z5111 Encounter for antineoplastic chemotherapy: Secondary | ICD-10-CM | POA: Diagnosis not present

## 2017-09-28 DIAGNOSIS — Z95828 Presence of other vascular implants and grafts: Secondary | ICD-10-CM

## 2017-09-28 DIAGNOSIS — D709 Neutropenia, unspecified: Secondary | ICD-10-CM | POA: Diagnosis not present

## 2017-09-28 MED ORDER — DOXORUBICIN HCL CHEMO IV INJECTION 2 MG/ML
60.0000 mg/m2 | Freq: Once | INTRAVENOUS | Status: AC
  Administered 2017-09-28: 102 mg via INTRAVENOUS
  Filled 2017-09-28: qty 51

## 2017-09-28 MED ORDER — SODIUM CHLORIDE 0.9% FLUSH
10.0000 mL | Freq: Once | INTRAVENOUS | Status: DC
  Filled 2017-09-28: qty 10

## 2017-09-28 MED ORDER — SODIUM CHLORIDE 0.9 % IV SOLN
Freq: Once | INTRAVENOUS | Status: AC
  Administered 2017-09-28: 09:00:00 via INTRAVENOUS
  Filled 2017-09-28: qty 5

## 2017-09-28 MED ORDER — PROMETHAZINE HCL 25 MG/ML IJ SOLN
INTRAMUSCULAR | Status: AC
  Filled 2017-09-28: qty 1

## 2017-09-28 MED ORDER — PEGFILGRASTIM 6 MG/0.6ML ~~LOC~~ PSKT
6.0000 mg | PREFILLED_SYRINGE | Freq: Once | SUBCUTANEOUS | Status: AC
  Administered 2017-09-28: 6 mg via SUBCUTANEOUS

## 2017-09-28 MED ORDER — HEPARIN SOD (PORK) LOCK FLUSH 100 UNIT/ML IV SOLN
500.0000 [IU] | Freq: Once | INTRAVENOUS | Status: AC | PRN
  Administered 2017-09-28: 500 [IU]
  Filled 2017-09-28: qty 5

## 2017-09-28 MED ORDER — SODIUM CHLORIDE 0.9 % IV SOLN
Freq: Once | INTRAVENOUS | Status: AC
  Administered 2017-09-28: 08:00:00 via INTRAVENOUS

## 2017-09-28 MED ORDER — PREDNISONE 5 MG PO TABS: ORAL_TABLET | ORAL | 0 refills | Status: DC

## 2017-09-28 MED ORDER — SODIUM CHLORIDE 0.9 % IV SOLN
600.0000 mg/m2 | Freq: Once | INTRAVENOUS | Status: AC
  Administered 2017-09-28: 1020 mg via INTRAVENOUS
  Filled 2017-09-28: qty 51

## 2017-09-28 MED ORDER — CEFUROXIME AXETIL 500 MG PO TABS: 500.0000 mg | ORAL_TABLET | Freq: Two times a day (BID) | ORAL | 0 refills | Status: DC

## 2017-09-28 MED ORDER — PEGFILGRASTIM 6 MG/0.6ML ~~LOC~~ PSKT
PREFILLED_SYRINGE | SUBCUTANEOUS | Status: AC
  Filled 2017-09-28: qty 0.6

## 2017-09-28 MED ORDER — SODIUM CHLORIDE 0.9% FLUSH
10.0000 mL | INTRAVENOUS | Status: DC | PRN
  Administered 2017-09-28: 10 mL
  Filled 2017-09-28: qty 10

## 2017-09-28 MED ORDER — PROMETHAZINE HCL 25 MG/ML IJ SOLN
12.5000 mg | Freq: Four times a day (QID) | INTRAMUSCULAR | Status: DC | PRN
  Administered 2017-09-28: 12.5 mg via INTRAVENOUS

## 2017-09-28 NOTE — Progress Notes (Signed)
[facility] Progress Note   Stephanie Bautista [id number] Dr. Jana Hakim 1950/10/30 67 y.o.  Stephanie Bautista is managed by Dr. Jana Hakim  Actively treated with chemotherapy: yes  Current Therapy: Adriamycin and Cytoxan with PEG filgrastim support  Last Treated: 09/28/2017 (cycle 3, day 1)  Assessment: Plan:    Acute maxillary sinusitis, recurrence not specified  Malignant neoplasm of upper-outer quadrant of left breast in female, estrogen receptor negative ([place])   ER negative malignant neoplasm of the upper outer quadrant of the left breast: The patient was treated with cycle 3-day 1 of Adriamycin and Cytoxan with PEG filgrastim support today.  Acute maxillary sinusitis: The patient was given a prescription for Ceftin 500 mg p.o. twice daily x10 days.  Additionally she was given a 6-day prednisone taper.  Please see After Visit Summary for patient specific instructions.  Future Appointments  Date Time Provider [place]  10/05/2017  3:45 PM [facility] LAB 6 [facility] None  10/05/2017  4:00 PM [facility] INJ NURSE [facility] None  10/05/2017  4:30 PM Magrinat, Virgie Dad, MD [facility] None  10/11/2017  3:45 PM [facility] LAB 4 [facility] None  10/11/2017  4:00 PM [facility] FLUSH NURSE 2 [facility] None  10/12/2017  8:00 AM [facility] D11 [facility] None    No orders of the defined types were placed in this encounter.      Subjective:   Patient ID:  Stephanie Bautista is a 46 y.o. (DOB 10/26/1950) female.  Chief Complaint: No chief complaint on file.   HPI Stephanie Bautista is a 67 year old female with a diagnosis of a triple negative breast cancer who is being treated with neoadjuvant chemotherapy.  She is being treated with cycle 3, day 1 of Adriamycin and Cytoxan today.  This provider was asked to see the patient while she was in the infusion room.  She reported maxillary and frontal sinus pressure and pain, postnasal drainage, rhinorrhea, upper tooth  pain, cough with clear sputum.  She has had sinusitis in the past and reports that her symptoms today are very similar to her prior episodes.  She denies fevers, chills, or sweats.   Medications: I have reviewed the patient's current medications.  Allergies:  Allergies  Allergen Reactions  . Amoxicillin Itching and Rash    Past Medical History:  Diagnosis Date  . Acoustic neuroma ([place])   . Adenomatous colon polyp 12/2002  . Arthritis   . Breast cancer ([place]) 1996   right breast cancer in 1996, left breast cancer in 2019  . Cholelithiases   . Esophageal stricture   . Family history of breast cancer   . Family history of ovarian cancer   . Family history of pancreatic cancer   . Family history of prostate cancer   . GERD (gastroesophageal reflux disease)   . Hemorrhoids   . Hiatal hernia   . History of colon polyps   . Inguinal hernia   . UTI (lower urinary tract infection)     Past Surgical History:  Procedure Laterality Date  . BREAST LUMPECTOMY    . CESAREAN SECTION     2x  . CHOLECYSTECTOMY  2008  . HEMORRHOID SURGERY  2007  . INGUINAL HERNIA REPAIR  1968  . PARTIAL HYSTERECTOMY  1996  . PORTACATH PLACEMENT Right 08/05/2017   Procedure: INSERTION PORT-A-CATH WITH [place];  Surgeon: Rolm Bookbinder, MD;  Location: [place];  Service: General;  Laterality: Right;  . TONSILLECTOMY      Family History  Problem  Relation Age of Onset  . Heart disease Mother   . Melanoma Mother        dx in her 74s  . Ovarian cancer Mother 54  . Heart disease Father   . Diabetes Father   . Stroke Father   . Ovarian cancer Sister 30  . Pancreatic cancer Maternal Aunt   . Bone cancer Maternal Uncle   . Breast cancer Paternal Aunt   . Non-Hodgkin's lymphoma Maternal Grandmother   . Heart disease Maternal Grandfather   . Heart disease Paternal Grandmother   . Prostate cancer Paternal Grandfather   . Stroke Maternal Uncle   . Bone cancer Maternal Uncle   . Cancer  Maternal Aunt        NOS  . Breast cancer Cousin        daughter of mat aunt with NOS cancer    Social History   Socioeconomic History  . Marital status: Married    Spouse name: Not on file  . Number of children: 2  . Years of education: Not on file  . Highest education level: Not on file  Occupational History  . Occupation: Office manager: [employer]  Social Needs  . Financial resource strain: Not on file  . Food insecurity:    Worry: Not on file    Inability: Not on file  . Transportation needs:    Medical: Not on file    Non-medical: Not on file  Tobacco Use  . Smoking status: Never Smoker  . Smokeless tobacco: Never Used  Substance and Sexual Activity  . Alcohol use: No  . Drug use: No  . Sexual activity: Not on file  Lifestyle  . Physical activity:    Days per week: Not on file    Minutes per session: Not on file  . Stress: Not on file  Relationships  . Social connections:    Talks on phone: Not on file    Gets together: Not on file    Attends religious service: Not on file    Active member of club or organization: Not on file    Attends meetings of clubs or organizations: Not on file    Relationship status: Not on file  . Intimate partner violence:    Fear of current or ex partner: Not on file    Emotionally abused: Not on file    Physically abused: Not on file    Forced sexual activity: Not on file  Other Topics Concern  . Not on file  Social History Narrative  . Not on file    Past Medical History, Surgical history, Social history, and Family history were reviewed and updated as appropriate.   Please see review of systems for further details on the patient's review from today.   Review of Systems:  Review of Systems  Constitutional: Negative for chills, diaphoresis and fever.  HENT: Positive for dental problem (Upper tooth pain), postnasal drip, rhinorrhea, sinus pressure and sinus pain. Negative for congestion, sneezing and sore throat.     Respiratory: Positive for cough. Negative for shortness of breath.   Neurological: Negative for headaches.    Objective:   Physical Exam:  There were no vitals taken for this visit. ECOG: 0  Physical Exam  Constitutional: No distress.  HENT:  Head: Normocephalic and atraumatic.  Nose: Right sinus exhibits maxillary sinus tenderness and frontal sinus tenderness. Left sinus exhibits maxillary sinus tenderness and frontal sinus tenderness.  Mouth/Throat: No oropharyngeal exudate.  Cardiovascular:  Normal rate, regular rhythm and normal heart sounds. Exam reveals no gallop and no friction rub.  No murmur heard. Pulmonary/Chest: Effort normal and breath sounds normal. No respiratory distress. She has no wheezes. She has no rales.  Neurological: She is alert.  Skin: Skin is warm and dry. No rash noted. She is not diaphoretic. No erythema.    Lab Review:     Component Value Date/Time   NA 140 09/27/2017 1540   K 3.8 09/27/2017 1540   CL 108 09/27/2017 1540   CO2 26 09/27/2017 1540   GLUCOSE 108 09/27/2017 1540   BUN 8 09/27/2017 1540   CREATININE 0.71 09/27/2017 1540   CREATININE 0.81 07/27/2017 1007   CREATININE 0.69 11/16/2011 1054   CALCIUM 9.1 09/27/2017 1540   PROT 6.6 09/27/2017 1540   ALBUMIN 3.9 09/27/2017 1540   AST 14 09/27/2017 1540   AST 16 07/27/2017 1007   ALT 12 09/27/2017 1540   ALT 12 07/27/2017 1007   ALKPHOS 70 09/27/2017 1540   BILITOT 0.3 09/27/2017 1540   BILITOT 0.6 07/27/2017 1007   GFRNONAA >60 09/27/2017 1540   GFRNONAA >60 07/27/2017 1007   GFRAA >60 09/27/2017 1540   GFRAA >60 07/27/2017 1007       Component Value Date/Time   WBC 6.7 09/27/2017 1540   RBC 3.83 09/27/2017 1540   HGB 11.1 (L) 09/27/2017 1540   HGB 13.7 07/27/2017 1007   HCT 33.1 (L) 09/27/2017 1540   PLT 233 09/27/2017 1540   PLT 203 07/27/2017 1007   MCV 86.4 09/27/2017 1540   MCV 87.1 11/16/2011 1056   MCH 28.9 09/27/2017 1540   MCHC 33.4 09/27/2017 1540   RDW 15.2  (H) 09/27/2017 1540   LYMPHSABS 0.7 (L) 09/27/2017 1540   MONOABS 0.9 09/27/2017 1540   EOSABS 0.0 09/27/2017 1540   BASOSABS 0.1 09/27/2017 1540   -------------------------------  Imaging from last 24 hours (if applicable):  Radiology interpretation: No results found.

## 2017-09-28 NOTE — Patient Instructions (Signed)
[facility] Discharge Instructions for Patients Receiving Chemotherapy  Today you received the following chemotherapy agents Adriamycin and Cytoxan  To help prevent nausea and vomiting after your treatment, we encourage you to take your nausea medication as directed.  If you develop nausea and vomiting that is not controlled by your nausea medication, call the clinic.   BELOW ARE SYMPTOMS THAT SHOULD BE REPORTED IMMEDIATELY:  *FEVER GREATER THAN 100.5 F  *CHILLS WITH OR WITHOUT FEVER  NAUSEA AND VOMITING THAT IS NOT CONTROLLED WITH YOUR NAUSEA MEDICATION  *UNUSUAL SHORTNESS OF BREATH  *UNUSUAL BRUISING OR BLEEDING  TENDERNESS IN MOUTH AND THROAT WITH OR WITHOUT PRESENCE OF ULCERS  *URINARY PROBLEMS  *BOWEL PROBLEMS  UNUSUAL RASH Items with * indicate a potential emergency and should be followed up as soon as possible.  Feel free to call the clinic should you have any questions or concerns. The clinic phone number is [phone].  Please show the CHEMO ALERT CARD at check-in to the Emergency Department and triage nurse.   

## 2017-09-28 NOTE — Progress Notes (Signed)
[place]  [phone] [phone]     ID: Stephanie Bautista DOB: 03/21/1951  MR#: [id number]  JSE#:[id number]  Patient Care Team: Haywood Pao, MD as PCP - General (Internal Medicine) Magrinat, Virgie Dad, MD as Consulting Physician (Oncology) Rolm Bookbinder, MD as Consulting Physician (General Surgery) Maisie Fus, MD as Consulting Physician (Obstetrics and Gynecology) Delrae Rend, MD as Consulting Physician (Endocrinology) Verner Chol, MD as Consulting Physician (Sports Medicine) Lavonia Dana, MD as Referring Physician (Otolaryngology) OTHER MD:  CHIEF COMPLAINT: Triple negative breast cancer  CURRENT TREATMENT: Neoadjuvant chemotherapy   HISTORY OF CURRENT ILLNESS: From the original intake note:  I saw Stephanie Bautista remotely for her history of ductal carcinoma in situ of the right breast, treated with lumpectomy 09/02/1994, with the pathology showing ([id number]) ductal carcinoma in situ.  There was no invasive component.  This was followed by radiation given between 09/21/94 and 11/04/94, with a total of 07371 centigray to the right breast and a total of 604 0 cGy to the tumor bed.  She notes that she didn't take any anti-estrogen therapy at that time.  More recently, Stephanie Bautista had routine screening mammography at [facility] on 07/16/2017 showing a possible abnormality in the left breast.  The breast density was category B.  She underwent left breast ultrasound on 07/16/2017 showing: a 1.5 cm irregular mass in the left breast upper outer quadrant posterior depth is consistent with carcinoma. A 6 mm irregular mass in the left breast upper outer quadrant posterior depth was also highly suggestive for malignancy. A lymph node in the left axillary tail was read as suspicious of malignancy.   Accordingly on 07/22/2017 she proceeded to biopsy of the left breast area in question as well as the suspicious left axillary lymph node. The pathology from this  procedure showed ([id number]): Lymph node, needle/core biopsy, left axilla with no evidence of carcinoma-- concordant. Breast, left, needle core biopsy, invasive ductal carcinoma, grade 3, prognostic indicators significant for: ER, 0% negative and PR, 0% negative. Proliferation marker Ki67 at 50%. HER2 negative signals ratio is 1.36, and the number per cell 1.70 .Breast, left, needle core biopsy, architectural distortion consistent with fibroadenoma--discordant.  She has a bilateral breast MRI scheduled for 07/28/2017.  The patient's subsequent history is as detailed below.  INTERVAL HISTORY: Stephanie Bautista returns today for a follow-up and treatment of her triple negative breast cancer accompanied by her husband. Today is day 8 of cycle 3 of cyclophosphamide and doxorubicin given every 14 days, to be followed by weekly paclitaxel/carboplatin x12.  (Recall she did take an extra week after cycle 2).  She reports that her nausea and headaches weren't as bad compared to cycle 2. She is still experiencing sensitivity to light. She also gets light-headed and dizzy. She has oral thrush. She was prescribed diflucan and lozages. She has been scrubbing her tongue to get rid of the thrush.   She also recieves Neulasta OnPro. She tolerates this well.      REVIEW OF SYSTEMS: Stephanie Bautista reports that she is okay. Since she has gotten the thrush, food does not taste good. She particularly cannot tolerate fruits. She also had a sinus infection after treatment. She denies visual changes or vomiting. There has been no unusual cough, phlegm production, or pleurisy. This been no change in bowel or bladder habits. She denies unexplained fatigue or unexplained weight loss, bleeding, rash, or fever. A detailed review of systems was otherwise stable.    PAST MEDICAL HISTORY: Past Medical  History:  Diagnosis Date  . Acoustic neuroma ([place])   . Adenomatous colon polyp 12/2002  . Arthritis   . Breast cancer ([place]) 1996   right breast cancer  in 1996, left breast cancer in 2019  . Cholelithiases   . Esophageal stricture   . Family history of breast cancer   . Family history of ovarian cancer   . Family history of pancreatic cancer   . Family history of prostate cancer   . GERD (gastroesophageal reflux disease)   . Hemorrhoids   . Hiatal hernia   . History of colon polyps   . Inguinal hernia   . UTI (lower urinary tract infection)   As far as PMHx she has had intermittent back pain (spinal stenosis) x 2 years,  Osteoporosis, random migraines, early cataracts and early hear loss due to schwannoma.   PAST SURGICAL HISTORY: Past Surgical History:  Procedure Laterality Date  . BREAST LUMPECTOMY    . CESAREAN SECTION     2x  . CHOLECYSTECTOMY  2008  . HEMORRHOID SURGERY  2007  . INGUINAL HERNIA REPAIR  1968  . PARTIAL HYSTERECTOMY  1996  . PORTACATH PLACEMENT Right 08/05/2017   Procedure: INSERTION PORT-A-CATH WITH [place];  Surgeon: Rolm Bookbinder, MD;  Location: [facility];  Service: General;  Laterality: Right;  . TONSILLECTOMY      FAMILY HISTORY Family History  Problem Relation Age of Onset  . Heart disease Mother   . Melanoma Mother        dx in her 56s  . Ovarian cancer Mother 31  . Heart disease Father   . Diabetes Father   . Stroke Father   . Ovarian cancer Sister 68  . Pancreatic cancer Maternal Aunt   . Bone cancer Maternal Uncle   . Breast cancer Paternal Aunt   . Non-Hodgkin's lymphoma Maternal Grandmother   . Heart disease Maternal Grandfather   . Heart disease Paternal Grandmother   . Prostate cancer Paternal Grandfather   . Stroke Maternal Uncle   . Bone cancer Maternal Uncle   . Cancer Maternal Aunt        NOS  . Breast cancer Cousin        daughter of mat aunt with NOS cancer  Her mother died from CHF at age 18. Her father died from cardiac issues at age 71. She doesn't have any brothers, but has 3 sisters. She had one sister and her mother with ovarian cancer. Her sister was  age 63 when she was diagnosed with ovarian cancer and her mother was in her late 32's when she was diagnosed with ovarian cancer. Her sister was not tested genetically following this. Her maternal grandmother had non-hodgkins lymphoma. Her paternal grandfather had prostate cancer. Her paternal aunt had breast cancer. Her paternal aunt's daughter had breast cancer. She had several uncles that had prostate cancer that spread to the bones.   GYNECOLOGIC HISTORY:  No LMP recorded. Patient has had a hysterectomy. Menarche: 67 years old Age at first live birth: 67 years old [place] P2 LMP: hysterectomy at age 53 S/p Hysterectomy with USO (L)  SOCIAL HISTORY: She has been retired from Medical sales representative work since 05/26/2017. Her husband, Kasandra Knudsen has been retired from the [employer] for the past 9 years. Their son is Leroy Sea is 43 and a IT trainer. Their daughter, Adonis Brook lives in [place], is 47 and works in child care at [employer]. The patient has 6 grandchildren ( 2 boys and  4 girls). She is a Psychologist, forensic.      ADVANCED DIRECTIVES:    HEALTH MAINTENANCE: Social History   Tobacco Use  . Smoking status: Never Smoker  . Smokeless tobacco: Never Used  Substance Use Topics  . Alcohol use: No  . Drug use: No     Colonoscopy: Dr. Fuller Plan.   PAP: Dr Nori Riis  Bone density: at Dr. Verlon Au office/ osteoporosis.    Allergies  Allergen Reactions  . Amoxicillin Itching and Rash    Current Outpatient Medications  Medication Sig Dispense Refill  . acetaminophen (TYLENOL) 500 MG tablet Take 500 mg by mouth every 6 (six) hours as needed.    Marland Kitchen alum & mag hydroxide-simeth (MAALOX/MYLANTA) 200-200-20 MG/5ML suspension Take by mouth every 6 (six) hours as needed for indigestion or heartburn.    . baclofen (LIORESAL) 10 MG tablet Take 10 mg by mouth as needed for muscle spasms.    . ciprofloxacin (CIPRO) 500 MG tablet Take one tablet twice a day for 5 days as directed 30 tablet 1  . clotrimazole  (MYCELEX) 10 MG troche Take 1 lozenge (10 mg total) by mouth 4 (four) times daily. 56 lozenge 0  . dexamethasone (DECADRON) 4 MG tablet Take 2 tablets by mouth once a day on the day after chemotherapy and then take 2 tablets two times a day for 2 days. Take with food. 30 tablet 1  . fluconazole (DIFLUCAN) 100 MG tablet Take 2 tablets (200 mg total) by mouth daily for 5 days. Take one tablet daily starting on day of chemo, and continue for 7 days 40 tablet 2  . lidocaine-prilocaine (EMLA) cream Apply to affected area once 30 g 3  . LORazepam (ATIVAN) 0.5 MG tablet Take 1 tablet (0.5 mg total) by mouth at bedtime as needed (Nausea or vomiting). 30 tablet 0  . omeprazole (PRILOSEC) 40 MG capsule Take 1 capsule (40 mg total) by mouth 2 (two) times daily. 60 capsule 11  . predniSONE (DELTASONE) 5 MG tablet 6 tab x 1 day, 5 tab x 1 day, 4 tab x 1 day, 3 tab x 1 day, 2 tab x 1 day, 1 tab x 1 day, stop 21 tablet 0  . prochlorperazine (COMPAZINE) 10 MG tablet Take 1 tablet (10 mg total) by mouth every 6 (six) hours as needed (Nausea or vomiting). 30 tablet 1  . promethazine (PHENERGAN) 12.5 MG tablet 1-2 p.o. every 6 hours as needed for nausea or headache 45 tablet 1  . traMADol (ULTRAM) 50 MG tablet 1-2 tablets every 6 hours as needed for pain 30 tablet 0   No current facility-administered medications for this visit.     OBJECTIVE: Middle-aged white woman in no acute distress  Vitals:   10/05/17 1604  BP: (!) 156/68  Pulse: 87  Resp: 18  Temp: 98.6 F (37 C)  SpO2: 100%     Body mass index is 24.13 kg/m.   Wt Readings from Last 3 Encounters:  10/05/17 136 lb 3.2 oz (61.8 kg)  10/04/17 137 lb 3.2 oz (62.2 kg)  09/15/17 138 lb 9.6 oz (62.9 kg)      ECOG FS:1 - Symptomatic but completely ambulatory  Sclerae unicteric, pupils round and equal Oropharynx shows mild thrush No cervical or supraclavicular adenopathy Lungs no rales or rhonchi Heart regular rate and rhythm Abd soft, nontender,  positive bowel sounds MSK no focal spinal tenderness, no upper extremity lymphedema Neuro: nonfocal, well oriented, appropriate affect Breasts: No palpable abnormalities in either breast.  Both axillae are benign.  LAB RESULTS:  CMP     Component Value Date/Time   NA 139 10/05/2017 1540   K 3.3 (L) 10/05/2017 1540   CL 107 10/05/2017 1540   CO2 26 10/05/2017 1540   GLUCOSE 132 10/05/2017 1540   BUN 9 10/05/2017 1540   CREATININE 0.71 10/05/2017 1540   CREATININE 0.81 07/27/2017 1007   CREATININE 0.69 11/16/2011 1054   CALCIUM 8.7 10/05/2017 1540   PROT 6.1 (L) 10/05/2017 1540   ALBUMIN 3.7 10/05/2017 1540   AST 9 10/05/2017 1540   AST 16 07/27/2017 1007   ALT 10 10/05/2017 1540   ALT 12 07/27/2017 1007   ALKPHOS 82 10/05/2017 1540   BILITOT 0.3 10/05/2017 1540   BILITOT 0.6 07/27/2017 1007   GFRNONAA >60 10/05/2017 1540   GFRNONAA >60 07/27/2017 1007   GFRAA >60 10/05/2017 1540   GFRAA >60 07/27/2017 1007    No results found for: TOTALPROTELP, ALBUMINELP, A1GS, A2GS, BETS, BETA2SER, GAMS, MSPIKE, SPEI  No results found for: KPAFRELGTCHN, LAMBDASER, KAPLAMBRATIO  Lab Results  Component Value Date   WBC 1.1 (L) 10/05/2017   NEUTROABS 0.4 (LL) 10/05/2017   HGB 10.1 (L) 10/05/2017   HCT 30.8 (L) 10/05/2017   MCV 89.3 10/05/2017   PLT 98 (L) 10/05/2017    _0 @  No results found for: LABCA2  No components found for: UDJSHF026  No results for input(s): INR in the last 168 hours.  No results found for: LABCA2  No results found for: VZC588  No results found for: FOY774  No results found for: JOI786  No results found for: CA2729  No components found for: HGQUANT  No results found for: CEA1 / No results found for: CEA1   No results found for: AFPTUMOR  No results found for: CHROMOGRNA  No results found for: PSA1  Appointment on 10/05/2017  Component Date Value Ref Range Status  . WBC 10/05/2017 1.1* 3.9 - 10.3 K/uL Final  . RBC  10/05/2017 3.45* 3.70 - 5.45 MIL/uL Final  . Hemoglobin 10/05/2017 10.1* 11.6 - 15.9 g/dL Final  . HCT 10/05/2017 30.8* 34.8 - 46.6 % Final  . MCV 10/05/2017 89.3  79.5 - 101.0 fL Final  . MCH 10/05/2017 29.3  25.1 - 34.0 pg Final  . MCHC 10/05/2017 32.8  31.5 - 36.0 g/dL Final  . RDW 10/05/2017 14.7* 11.2 - 14.5 % Final  . Platelets 10/05/2017 98* 145 - 400 K/uL Final  . Neutrophils Relative % 10/05/2017 37  % Final  . Neutro Abs 10/05/2017 0.4* 1.5 - 6.5 K/uL Final   CRITICAL RESULT CALLED TO, READ BACK BY AND VERIFIED WITH: VALARIE   . Lymphocytes Relative 10/05/2017 33  % Final  . Lymphs Abs 10/05/2017 0.4* 0.9 - 3.3 K/uL Final  . Monocytes Relative 10/05/2017 25  % Final  . Monocytes Absolute 10/05/2017 0.3  0.1 - 0.9 K/uL Final  . Eosinophils Relative 10/05/2017 1  % Final  . Eosinophils Absolute 10/05/2017 0.0  0.0 - 0.5 K/uL Final  . Basophils Relative 10/05/2017 4  % Final  . Basophils Absolute 10/05/2017 0.0  0.0 - 0.1 K/uL Final   Performed at [facility], [place] [address] [place], [place] [address]  . Sodium 10/05/2017 139  136 - 145 mmol/L Final  . Potassium 10/05/2017 3.3* 3.5 - 5.1 mmol/L Final  . Chloride 10/05/2017 107  98 - 109 mmol/L Final  . CO2 10/05/2017 26  22 - 29 mmol/L Final  .  Glucose, Bld 10/05/2017 132  70 - 140 mg/dL Final  . BUN 10/05/2017 9  7 - 26 mg/dL Final  . Creatinine, Ser 10/05/2017 0.71  0.60 - 1.10 mg/dL Final  . Calcium 10/05/2017 8.7  8.4 - 10.4 mg/dL Final  . Total Protein 10/05/2017 6.1* 6.4 - 8.3 g/dL Final  . Albumin 10/05/2017 3.7  3.5 - 5.0 g/dL Final  . AST 10/05/2017 9  5 - 34 U/L Final  . ALT 10/05/2017 10  0 - 55 U/L Final  . Alkaline Phosphatase 10/05/2017 82  40 - 150 U/L Final  . Total Bilirubin 10/05/2017 0.3  0.2 - 1.2 mg/dL Final  . GFR calc non Af Amer 10/05/2017 >60  >60 mL/min Final  . GFR calc Af Amer 10/05/2017 >60  >60 mL/min Final   Comment: (NOTE) The eGFR has been calculated using the CKD  EPI equation. This calculation has not been validated in all clinical situations. eGFR's persistently <60 mL/min signify possible Chronic Kidney Disease.   Georgiann Hahn gap 10/05/2017 6  3 - 11 Final   Performed at [facility], [place] [address] [place], [place] [address]    (this displays the last labs from the last 3 days)  No results found for: TOTALPROTELP, ALBUMINELP, A1GS, A2GS, BETS, BETA2SER, GAMS, MSPIKE, SPEI (this displays SPEP labs)  No results found for: KPAFRELGTCHN, LAMBDASER, KAPLAMBRATIO (kappa/lambda light chains)  No results found for: HGBA, HGBA2QUANT, HGBFQUANT, HGBSQUAN (Hemoglobinopathy evaluation)   No results found for: LDH  No results found for: IRON, TIBC, IRONPCTSAT (Iron and TIBC)  No results found for: FERRITIN  Urinalysis    Component Value Date/Time   BILIRUBINUR neg 04/06/2014 1019   PROTEINUR neg 04/06/2014 1019   UROBILINOGEN 0.2 04/06/2014 1019   NITRITE neg 04/06/2014 1019   LEUKOCYTESUR Negative 04/06/2014 1019     STUDIES: No results found.   ELIGIBLE FOR AVAILABLE RESEARCH PROTOCOL: UPBEAT  ASSESSMENT: 67 y.o. [place] woman  (1) status post right lumpectomy 09/02/1994 for ductal carcinoma in situ  (a) status post adjuvant radiation 09/21/1994 through 11/04/1994, total 5040 centigrade to the breast and 604 0 cGy to the tumor bed  (b) did not receive adjuvant antiestrogens  (2) status post left breast upper outer quadrant biopsy 07/22/2017 for a clinical T1c pN0, stage IB invasive ductal carcinoma, grade 3, triple negative, with an MIB-1 of 50%.  (a) lymph node biopsy on the same day was negative for malignancy (concordant)  (b) a second area of architectural distortion was also biopsied, read as fibroadenoma (discordant)  (c) a third area of architectural distortion has not yet been biopsied  (d) breast MRI scheduled for 07/28/2017 shows a clinical T2 N0 tumor  (3) genetics testing 08/02/2017 through  the Multi-Gene Panel offered by [facility] found no deleterious mutations in ALK, APC, ATM, AXIN2,BAP1,  BARD1, BLM, BMPR1A, BRCA1, BRCA2, BRIP1, CASR, CDC73, CDH1, CDK4, CDKN1B, CDKN1C, CDKN2A (p14ARF), CDKN2A (p16INK4a), CEBPA, CHEK2, CTNNA1, DICER1, DIS3L2, EGFR (c.2369C>T, p.Thr790Met variant only), EPCAM (Deletion/duplication testing only), FH, FLCN, GATA2, GPC3, GREM1 (Promoter region deletion/duplication testing only), HOXB13 (c.251G>A, p.Gly84Glu), HRAS, KIT, MAX, MEN1, MET, MITF (c.952G>A, p.Glu318Lys variant only), MLH1, MSH2, MSH3, MSH6, MUTYH, NBN, NF1, NF2, NTHL1, PALB2, PDGFRA, PHOX2B, PMS2, POLD1, POLE, POT1, PRKAR1A, PTCH1, PTEN, RAD50, RAD51C, RAD51D, RB1, RECQL4, RET, RUNX1, SDHAF2, SDHA (sequence changes only), SDHB, SDHC, SDHD, SMAD4, SMARCA4, SMARCB1, SMARCE1, STK11, SUFU, TERT, TERT, TMEM127, TP53, TSC1, TSC2, VHL, WRN and WT1.  The report date is August 02, 2017.  (a) WT1  c.332C>T VUS identified on the Multi-cancer gene panel.    (4) chemotherapy will consist of doxorubicin/ cyclophosphamide in dose dense fashion x4 starting 08/24/2017 followed by paclitaxel/ carboplatin weekly x12  (5) definitive surgery to follow  (6) adjuvant radiation to follow as appropriate  PLAN: Stephanie Bautista tolerated her third cycle moderately well.  Certainly better than the second cycle, thanks to the week she took in between.  She is neutropenic.  I am putting her back on Cipro for the next 5 days and I have asked her to please call us if any temperature above 100 develops at any time.  She still has some thrush.  She was started on lozenges and that did not help all that much.  We are continuing the lozenges but also increasing the Diflucan to 200 mg daily.  Her reflux is back.  I have refilled her Prilosec 40 mg which she should take at bedtime for the next 2 weeks.  She is scheduled for her final cycle of cyclophosphamide and doxorubicin next week.  However if she is feeling very puny we will wait an extra  week.  She is hoping to be able to get treated next week and then take 2 weeks after that before starting the next cycle of chemo  She knows to call for any other issues that may develop before the next visit.  Magrinat, Virgie Dad, MD  10/05/17 4:47 PM Medical Oncology and Hematology [facility] [address] [place], [place] [address] Tel. [phone]    Fax. [phone]  This document serves as a record of services personally performed by Lurline Del, MD. It was created on his behalf by Sheron Nightingale, a trained medical scribe. The creation of this record is based on the scribe's personal observations and the provider's statements to them.   I have reviewed the above documentation for accuracy and completeness, and I agree with the above.

## 2017-10-04 ENCOUNTER — Other Ambulatory Visit: Payer: Medicare Other

## 2017-10-04 ENCOUNTER — Telehealth: Payer: Self-pay | Admitting: *Deleted

## 2017-10-04 ENCOUNTER — Inpatient Hospital Stay (HOSPITAL_BASED_OUTPATIENT_CLINIC_OR_DEPARTMENT_OTHER): Payer: Medicare Other | Admitting: Medical

## 2017-10-04 VITALS — BP 130/71 | HR 74 | Temp 98.4°F | Resp 16 | Ht 63.0 in | Wt 137.2 lb

## 2017-10-04 DIAGNOSIS — R21 Rash and other nonspecific skin eruption: Secondary | ICD-10-CM

## 2017-10-04 DIAGNOSIS — Z5111 Encounter for antineoplastic chemotherapy: Secondary | ICD-10-CM | POA: Diagnosis not present

## 2017-10-04 DIAGNOSIS — C50412 Malignant neoplasm of upper-outer quadrant of left female breast: Secondary | ICD-10-CM | POA: Diagnosis not present

## 2017-10-04 DIAGNOSIS — Z171 Estrogen receptor negative status [ER-]: Secondary | ICD-10-CM | POA: Diagnosis not present

## 2017-10-04 DIAGNOSIS — B370 Candidal stomatitis: Secondary | ICD-10-CM | POA: Diagnosis not present

## 2017-10-04 DIAGNOSIS — D709 Neutropenia, unspecified: Secondary | ICD-10-CM | POA: Diagnosis not present

## 2017-10-04 DIAGNOSIS — J0100 Acute maxillary sinusitis, unspecified: Secondary | ICD-10-CM | POA: Diagnosis not present

## 2017-10-04 MED ORDER — CLOTRIMAZOLE 10 MG MT LOZG: 10.0000 mg | LOZENGE | Freq: Four times a day (QID) | OROMUCOSAL | 0 refills | Status: DC

## 2017-10-04 NOTE — Telephone Encounter (Signed)
Pt called with c/o thrush to tongue and inside of mouth. Relate she has white coating on tongue. Currently taking diflucan 100mg  daily. Started taking on 09/29/17. Per Dr. Jana Hakim pt should see Lucianne Lei PA. Winferd Humphrey with pt symptoms.  Called pt and discussed appt for 10/04/17 at 2:15.

## 2017-10-05 ENCOUNTER — Ambulatory Visit: Payer: Medicare Other

## 2017-10-05 ENCOUNTER — Inpatient Hospital Stay: Payer: Medicare Other

## 2017-10-05 ENCOUNTER — Inpatient Hospital Stay (HOSPITAL_BASED_OUTPATIENT_CLINIC_OR_DEPARTMENT_OTHER): Payer: Medicare Other | Admitting: Oncology

## 2017-10-05 VITALS — BP 156/68 | HR 87 | Temp 98.6°F | Resp 18 | Ht 63.0 in | Wt 136.2 lb

## 2017-10-05 DIAGNOSIS — T451X5A Adverse effect of antineoplastic and immunosuppressive drugs, initial encounter: Secondary | ICD-10-CM

## 2017-10-05 DIAGNOSIS — K219 Gastro-esophageal reflux disease without esophagitis: Secondary | ICD-10-CM | POA: Diagnosis not present

## 2017-10-05 DIAGNOSIS — D0511 Intraductal carcinoma in situ of right breast: Secondary | ICD-10-CM

## 2017-10-05 DIAGNOSIS — Z5111 Encounter for antineoplastic chemotherapy: Secondary | ICD-10-CM | POA: Diagnosis not present

## 2017-10-05 DIAGNOSIS — Z171 Estrogen receptor negative status [ER-]: Secondary | ICD-10-CM

## 2017-10-05 DIAGNOSIS — Z86000 Personal history of in-situ neoplasm of breast: Secondary | ICD-10-CM | POA: Diagnosis not present

## 2017-10-05 DIAGNOSIS — C50412 Malignant neoplasm of upper-outer quadrant of left female breast: Secondary | ICD-10-CM

## 2017-10-05 DIAGNOSIS — M810 Age-related osteoporosis without current pathological fracture: Secondary | ICD-10-CM | POA: Diagnosis not present

## 2017-10-05 DIAGNOSIS — B370 Candidal stomatitis: Secondary | ICD-10-CM | POA: Insufficient documentation

## 2017-10-05 DIAGNOSIS — D709 Neutropenia, unspecified: Secondary | ICD-10-CM

## 2017-10-05 DIAGNOSIS — D701 Agranulocytosis secondary to cancer chemotherapy: Secondary | ICD-10-CM | POA: Insufficient documentation

## 2017-10-05 DIAGNOSIS — Z95828 Presence of other vascular implants and grafts: Secondary | ICD-10-CM

## 2017-10-05 DIAGNOSIS — J0100 Acute maxillary sinusitis, unspecified: Secondary | ICD-10-CM | POA: Diagnosis not present

## 2017-10-05 LAB — CBC WITH DIFFERENTIAL/PLATELET
Basophils Absolute: 0 10*3/uL (ref 0.0–0.1)
Basophils Relative: 4 %
EOS ABS: 0 10*3/uL (ref 0.0–0.5)
EOS PCT: 1 %
HCT: 30.8 % — ABNORMAL LOW (ref 34.8–46.6)
HEMOGLOBIN: 10.1 g/dL — AB (ref 11.6–15.9)
LYMPHS ABS: 0.4 10*3/uL — AB (ref 0.9–3.3)
Lymphocytes Relative: 33 %
MCH: 29.3 pg (ref 25.1–34.0)
MCHC: 32.8 g/dL (ref 31.5–36.0)
MCV: 89.3 fL (ref 79.5–101.0)
MONO ABS: 0.3 10*3/uL (ref 0.1–0.9)
MONOS PCT: 25 %
Neutro Abs: 0.4 10*3/uL — CL (ref 1.5–6.5)
Neutrophils Relative %: 37 %
Platelets: 98 10*3/uL — ABNORMAL LOW (ref 145–400)
RBC: 3.45 MIL/uL — AB (ref 3.70–5.45)
RDW: 14.7 % — ABNORMAL HIGH (ref 11.2–14.5)
WBC: 1.1 10*3/uL — ABNORMAL LOW (ref 3.9–10.3)

## 2017-10-05 LAB — COMPREHENSIVE METABOLIC PANEL
ALK PHOS: 82 U/L (ref 40–150)
ALT: 10 U/L (ref 0–55)
AST: 9 U/L (ref 5–34)
Albumin: 3.7 g/dL (ref 3.5–5.0)
Anion gap: 6 (ref 3–11)
BUN: 9 mg/dL (ref 7–26)
CALCIUM: 8.7 mg/dL (ref 8.4–10.4)
CO2: 26 mmol/L (ref 22–29)
CREATININE: 0.71 mg/dL (ref 0.60–1.10)
Chloride: 107 mmol/L (ref 98–109)
GFR calc non Af Amer: >60 mL/min (ref >60)
Glucose, Bld: 132 mg/dL (ref 70–140)
Potassium: 3.3 mmol/L — ABNORMAL LOW (ref 3.5–5.1)
SODIUM: 139 mmol/L (ref 136–145)
Total Bilirubin: 0.3 mg/dL (ref 0.2–1.2)
Total Protein: 6.1 g/dL — ABNORMAL LOW (ref 6.4–8.3)

## 2017-10-05 MED ORDER — SODIUM CHLORIDE 0.9% FLUSH
10.0000 mL | Freq: Once | INTRAVENOUS | Status: AC
  Administered 2017-10-05: 10 mL
  Filled 2017-10-05: qty 10

## 2017-10-05 MED ORDER — OMEPRAZOLE 40 MG PO CPDR: 40.0000 mg | DELAYED_RELEASE_CAPSULE | Freq: Two times a day (BID) | ORAL | 11 refills | Status: DC

## 2017-10-05 MED ORDER — CIPROFLOXACIN HCL 500 MG PO TABS: ORAL_TABLET | ORAL | 1 refills | Status: DC

## 2017-10-05 MED ORDER — FLUCONAZOLE 100 MG PO TABS: 200.0000 mg | ORAL_TABLET | Freq: Every day | ORAL | 2 refills | Status: AC

## 2017-10-05 MED ORDER — HEPARIN SOD (PORK) LOCK FLUSH 100 UNIT/ML IV SOLN
500.0000 [IU] | Freq: Once | INTRAVENOUS | Status: AC
  Administered 2017-10-05: 500 [IU]
  Filled 2017-10-05: qty 5

## 2017-10-05 NOTE — Patient Instructions (Signed)
Implanted Port Home Guide An implanted port is a type of central line that is placed under the skin. Central lines are used to provide IV access when treatment or nutrition needs to be given through a person's veins. Implanted ports are used for long-term IV access. An implanted port may be placed because:  You need IV medicine that would be irritating to the small veins in your hands or arms.  You need long-term IV medicines, such as antibiotics.  You need IV nutrition for a long period.  You need frequent blood draws for lab tests.  You need dialysis.  Implanted ports are usually placed in the chest area, but they can also be placed in the upper arm, the abdomen, or the leg. An implanted port has two main parts:  Reservoir. The reservoir is round and will appear as a small, raised area under your skin. The reservoir is the part where a needle is inserted to give medicines or draw blood.  Catheter. The catheter is a thin, flexible tube that extends from the reservoir. The catheter is placed into a large vein. Medicine that is inserted into the reservoir goes into the catheter and then into the vein.  How will I care for my incision site? Do not get the incision site wet. Bathe or shower as directed by your health care provider. How is my port accessed? Special steps must be taken to access the port:  Before the port is accessed, a numbing cream can be placed on the skin. This helps numb the skin over the port site.  Your health care provider uses a sterile technique to access the port. ? Your health care provider must put on a mask and sterile gloves. ? The skin over your port is cleaned carefully with an antiseptic and allowed to dry. ? The port is gently pinched between sterile gloves, and a needle is inserted into the port.  Only "non-coring" port needles should be used to access the port. Once the port is accessed, a blood return should be checked. This helps ensure that the port  is in the vein and is not clogged.  If your port needs to remain accessed for a constant infusion, a clear (transparent) bandage will be placed over the needle site. The bandage and needle will need to be changed every week, or as directed by your health care provider.  Keep the bandage covering the needle clean and dry. Do not get it wet. Follow your health care provider's instructions on how to take a shower or bath while the port is accessed.  If your port does not need to stay accessed, no bandage is needed over the port.  What is flushing? Flushing helps keep the port from getting clogged. Follow your health care provider's instructions on how and when to flush the port. Ports are usually flushed with saline solution or a medicine called heparin. The need for flushing will depend on how the port is used.  If the port is used for intermittent medicines or blood draws, the port will need to be flushed: ? After medicines have been given. ? After blood has been drawn. ? As part of routine maintenance.  If a constant infusion is running, the port may not need to be flushed.  How long will my port stay implanted? The port can stay in for as long as your health care provider thinks it is needed. When it is time for the port to come out, surgery will be   done to remove it. The procedure is similar to the one performed when the port was put in. When should I seek immediate medical care? When you have an implanted port, you should seek immediate medical care if:  You notice a bad smell coming from the incision site.  You have swelling, redness, or drainage at the incision site.  You have more swelling or pain at the port site or the surrounding area.  You have a fever that is not controlled with medicine.  This information is not intended to replace advice given to you by your health care provider. Make sure you discuss any questions you have with your health care provider. Document  Released: 05/11/2005 Document Revised: 10/17/2015 Document Reviewed: 01/16/2013 Elsevier Interactive Patient Education  2017 Elsevier Inc.  

## 2017-10-06 NOTE — Progress Notes (Signed)
[facility] Progress Note   Stephanie Bautista [id number] Jun 14, 1950 67 y.o.  Stephanie Bautista is managed by Dr. Jana Hakim  Actively treated with chemotherapy: yes  Current Therapy: Adriamycin and Cytoxan  Last Treated: 09/28/2017 (cycle 3, day 1)  Assessment: Plan:    Oral candidiasis - Plan: clotrimazole (MYCELEX) 10 MG troche   Oral candidiasis: The patient had been taking Diflucan for a rash.  I instructed her to stop Diflucan while she is using Chlortrimazole troches.  Please see After Visit Summary for patient specific instructions.  Future Appointments  Date Time Provider [place]  10/11/2017  3:45 PM [facility] LAB 4 [facility] None  10/11/2017  4:00 PM [facility] FLUSH NURSE 2 [facility] None  10/12/2017  8:00 AM [facility] D11 [facility] None    No orders of the defined types were placed in this encounter.     Subjective:   Patient ID:  Stephanie Bautista is a 72 y.o. (DOB [birth year]) female.  Chief Complaint: No chief complaint on file.   HPI Stephanie Bautista is a 67 year old female with a history of a triple negative breast cancer treated with neoadjuvant chemotherapy.  The patient is currently receiving Adriamycin and Cytoxan with cycle 3 dosed on 09/28/2017.  The patient has been taking Diflucan for a rash.  She has had quite plaquing of her tongue despite this with a foul taste in her mouth.  She reports that she scraped off the white plaque  yesterday.  Today it recurred.  She once again scraped off the white plaque and then gargled hydrogen peroxide.  Medications: I have reviewed the patient's current medications.  Allergies:  Allergies  Allergen Reactions  . Amoxicillin Itching and Rash    Past Medical History:  Diagnosis Date  . Acoustic neuroma ([place])   . Adenomatous colon polyp 12/2002  . Arthritis   . Breast cancer ([place]) 1996   right breast cancer in 1996, left breast cancer in 2019  . Cholelithiases   . Esophageal  stricture   . Family history of breast cancer   . Family history of ovarian cancer   . Family history of pancreatic cancer   . Family history of prostate cancer   . GERD (gastroesophageal reflux disease)   . Hemorrhoids   . Hiatal hernia   . History of colon polyps   . Inguinal hernia   . UTI (lower urinary tract infection)     Past Surgical History:  Procedure Laterality Date  . BREAST LUMPECTOMY    . CESAREAN SECTION     2x  . CHOLECYSTECTOMY  2008  . HEMORRHOID SURGERY  2007  . INGUINAL HERNIA REPAIR  1968  . PARTIAL HYSTERECTOMY  1996  . PORTACATH PLACEMENT Right 08/05/2017   Procedure: INSERTION PORT-A-CATH WITH [place];  Surgeon: Rolm Bookbinder, MD;  Location: [facility];  Service: General;  Laterality: Right;  . TONSILLECTOMY      Family History  Problem Relation Age of Onset  . Heart disease Mother   . Melanoma Mother        dx in her 8s  . Ovarian cancer Mother 51  . Heart disease Father   . Diabetes Father   . Stroke Father   . Ovarian cancer Sister 44  . Pancreatic cancer Maternal Aunt   . Bone cancer Maternal Uncle   . Breast cancer Paternal Aunt   . Non-Hodgkin's lymphoma Maternal Grandmother   . Heart disease Maternal Grandfather   . Heart disease Paternal Grandmother   .  Prostate cancer Paternal Grandfather   . Stroke Maternal Uncle   . Bone cancer Maternal Uncle   . Cancer Maternal Aunt        NOS  . Breast cancer Cousin        daughter of mat aunt with NOS cancer    Social History   Socioeconomic History  . Marital status: Married    Spouse name: Not on file  . Number of children: 2  . Years of education: Not on file  . Highest education level: Not on file  Occupational History  . Occupation: Office manager: [employer]  Social Needs  . Financial resource strain: Not on file  . Food insecurity:    Worry: Not on file    Inability: Not on file  . Transportation needs:    Medical: Not on file    Non-medical: Not on  file  Tobacco Use  . Smoking status: Never Smoker  . Smokeless tobacco: Never Used  Substance and Sexual Activity  . Alcohol use: No  . Drug use: No  . Sexual activity: Not on file  Lifestyle  . Physical activity:    Days per week: Not on file    Minutes per session: Not on file  . Stress: Not on file  Relationships  . Social connections:    Talks on phone: Not on file    Gets together: Not on file    Attends religious service: Not on file    Active member of club or organization: Not on file    Attends meetings of clubs or organizations: Not on file    Relationship status: Not on file  . Intimate partner violence:    Fear of current or ex partner: Not on file    Emotionally abused: Not on file    Physically abused: Not on file    Forced sexual activity: Not on file  Other Topics Concern  . Not on file  Social History Narrative  . Not on file    Past Medical History, Surgical history, Social history, and Family history were reviewed and updated as appropriate.   Please see review of systems for further details on the patient's review from today.   Review of Systems:  Review of Systems  Constitutional: Positive for appetite change (Due to foul taste in mouth). Negative for activity change, chills, diaphoresis, fatigue, fever and unexpected weight change.  HENT: Negative for trouble swallowing.        Whitish plaquing of the tongue.  Respiratory: Negative for cough, choking and shortness of breath.   Cardiovascular: Negative for chest pain.  Gastrointestinal: Negative for nausea and vomiting.    Objective:   Physical Exam:  BP 130/71 (BP Location: Left Arm, Patient Position: Sitting)   Pulse 74   Temp 98.4 F (36.9 C) (Oral)   Resp 16   Ht 5\' 3"  (1.6 m)   Wt 137 lb 3.2 oz (62.2 kg)   SpO2 100%   BMI 24.30 kg/m  ECOG: 0  Physical Exam  Constitutional: No distress.  HENT:  Head: Normocephalic and atraumatic.  The tongue appears inflamed.  No whitish  plaquing is noted.  Cardiovascular: Normal rate, regular rhythm and normal heart sounds. Exam reveals no gallop and no friction rub.  No murmur heard. Pulmonary/Chest: Effort normal and breath sounds normal. No respiratory distress. She has no wheezes. She has no rales.  Neurological: She is alert.  Skin: Skin is warm and dry. No rash noted. She  is not diaphoretic. No erythema.    Lab Review:     Component Value Date/Time   NA 139 10/05/2017 1540   K 3.3 (L) 10/05/2017 1540   CL 107 10/05/2017 1540   CO2 26 10/05/2017 1540   GLUCOSE 132 10/05/2017 1540   BUN 9 10/05/2017 1540   CREATININE 0.71 10/05/2017 1540   CREATININE 0.81 07/27/2017 1007   CREATININE 0.69 11/16/2011 1054   CALCIUM 8.7 10/05/2017 1540   PROT 6.1 (L) 10/05/2017 1540   ALBUMIN 3.7 10/05/2017 1540   AST 9 10/05/2017 1540   AST 16 07/27/2017 1007   ALT 10 10/05/2017 1540   ALT 12 07/27/2017 1007   ALKPHOS 82 10/05/2017 1540   BILITOT 0.3 10/05/2017 1540   BILITOT 0.6 07/27/2017 1007   GFRNONAA >60 10/05/2017 1540   GFRNONAA >60 07/27/2017 1007   GFRAA >60 10/05/2017 1540   GFRAA >60 07/27/2017 1007       Component Value Date/Time   WBC 1.1 (L) 10/05/2017 1540   RBC 3.45 (L) 10/05/2017 1540   HGB 10.1 (L) 10/05/2017 1540   HGB 13.7 07/27/2017 1007   HCT 30.8 (L) 10/05/2017 1540   PLT 98 (L) 10/05/2017 1540   PLT 203 07/27/2017 1007   MCV 89.3 10/05/2017 1540   MCV 87.1 11/16/2011 1056   MCH 29.3 10/05/2017 1540   MCHC 32.8 10/05/2017 1540   RDW 14.7 (H) 10/05/2017 1540   LYMPHSABS 0.4 (L) 10/05/2017 1540   MONOABS 0.3 10/05/2017 1540   EOSABS 0.0 10/05/2017 1540   BASOSABS 0.0 10/05/2017 1540   -------------------------------  Imaging from last 24 hours (if applicable):  Radiology interpretation: No results found.

## 2017-10-11 ENCOUNTER — Inpatient Hospital Stay: Payer: Medicare Other

## 2017-10-11 DIAGNOSIS — J0100 Acute maxillary sinusitis, unspecified: Secondary | ICD-10-CM | POA: Diagnosis not present

## 2017-10-11 DIAGNOSIS — C50412 Malignant neoplasm of upper-outer quadrant of left female breast: Secondary | ICD-10-CM

## 2017-10-11 DIAGNOSIS — Z95828 Presence of other vascular implants and grafts: Secondary | ICD-10-CM

## 2017-10-11 DIAGNOSIS — Z171 Estrogen receptor negative status [ER-]: Secondary | ICD-10-CM | POA: Diagnosis not present

## 2017-10-11 DIAGNOSIS — B370 Candidal stomatitis: Secondary | ICD-10-CM | POA: Diagnosis not present

## 2017-10-11 DIAGNOSIS — D0511 Intraductal carcinoma in situ of right breast: Secondary | ICD-10-CM

## 2017-10-11 DIAGNOSIS — D709 Neutropenia, unspecified: Secondary | ICD-10-CM | POA: Diagnosis not present

## 2017-10-11 DIAGNOSIS — Z5111 Encounter for antineoplastic chemotherapy: Secondary | ICD-10-CM | POA: Diagnosis not present

## 2017-10-11 LAB — COMPREHENSIVE METABOLIC PANEL
ALK PHOS: 76 U/L (ref 40–150)
ALT: 13 U/L (ref 0–55)
AST: 13 U/L (ref 5–34)
Albumin: 3.8 g/dL (ref 3.5–5.0)
Anion gap: 7 (ref 3–11)
BILIRUBIN TOTAL: 0.3 mg/dL (ref 0.2–1.2)
BUN: 6 mg/dL — ABNORMAL LOW (ref 7–26)
CALCIUM: 8.5 mg/dL (ref 8.4–10.4)
CO2: 24 mmol/L (ref 22–29)
CREATININE: 0.72 mg/dL (ref 0.60–1.10)
Chloride: 106 mmol/L (ref 98–109)
Glucose, Bld: 97 mg/dL (ref 70–140)
Potassium: 3.6 mmol/L (ref 3.5–5.1)
Sodium: 137 mmol/L (ref 136–145)
TOTAL PROTEIN: 6.5 g/dL (ref 6.4–8.3)

## 2017-10-11 LAB — CBC WITH DIFFERENTIAL/PLATELET
BASOS PCT: 1 %
Basophils Absolute: 0 10*3/uL (ref 0.0–0.1)
EOS ABS: 0 10*3/uL (ref 0.0–0.5)
Eosinophils Relative: 0 %
HCT: 32 % — ABNORMAL LOW (ref 34.8–46.6)
HEMOGLOBIN: 10.6 g/dL — AB (ref 11.6–15.9)
Lymphocytes Relative: 16 %
Lymphs Abs: 0.9 10*3/uL (ref 0.9–3.3)
MCH: 29.4 pg (ref 25.1–34.0)
MCHC: 33.3 g/dL (ref 31.5–36.0)
MCV: 88.3 fL (ref 79.5–101.0)
MONOS PCT: 11 %
Monocytes Absolute: 0.6 10*3/uL (ref 0.1–0.9)
Neutro Abs: 3.9 10*3/uL (ref 1.5–6.5)
Neutrophils Relative %: 72 %
Platelets: 124 10*3/uL — ABNORMAL LOW (ref 145–400)
RBC: 3.62 MIL/uL — AB (ref 3.70–5.45)
RDW: 16.1 % — ABNORMAL HIGH (ref 11.2–14.5)
WBC: 5.5 10*3/uL (ref 3.9–10.3)

## 2017-10-11 MED ORDER — SODIUM CHLORIDE 0.9% FLUSH
10.0000 mL | Freq: Once | INTRAVENOUS | Status: AC
  Administered 2017-10-11: 10 mL
  Filled 2017-10-11: qty 10

## 2017-10-11 MED ORDER — HEPARIN SOD (PORK) LOCK FLUSH 100 UNIT/ML IV SOLN
250.0000 [IU] | Freq: Once | INTRAVENOUS | Status: AC
  Administered 2017-10-11: 16:00:00
  Filled 2017-10-11: qty 5

## 2017-10-12 ENCOUNTER — Inpatient Hospital Stay: Payer: Medicare Other

## 2017-10-12 ENCOUNTER — Other Ambulatory Visit: Payer: Self-pay | Admitting: Oncology

## 2017-10-12 VITALS — BP 144/84 | HR 74 | Temp 98.6°F | Resp 18

## 2017-10-12 DIAGNOSIS — Z171 Estrogen receptor negative status [ER-]: Principal | ICD-10-CM

## 2017-10-12 DIAGNOSIS — C50412 Malignant neoplasm of upper-outer quadrant of left female breast: Secondary | ICD-10-CM

## 2017-10-12 DIAGNOSIS — B370 Candidal stomatitis: Secondary | ICD-10-CM | POA: Diagnosis not present

## 2017-10-12 DIAGNOSIS — D709 Neutropenia, unspecified: Secondary | ICD-10-CM | POA: Diagnosis not present

## 2017-10-12 DIAGNOSIS — J0100 Acute maxillary sinusitis, unspecified: Secondary | ICD-10-CM | POA: Diagnosis not present

## 2017-10-12 DIAGNOSIS — Z5111 Encounter for antineoplastic chemotherapy: Secondary | ICD-10-CM | POA: Diagnosis not present

## 2017-10-12 MED ORDER — SODIUM CHLORIDE 0.9 % IV SOLN
600.0000 mg/m2 | Freq: Once | INTRAVENOUS | Status: AC
  Administered 2017-10-12: 1020 mg via INTRAVENOUS
  Filled 2017-10-12: qty 51

## 2017-10-12 MED ORDER — PEGFILGRASTIM 6 MG/0.6ML ~~LOC~~ PSKT
PREFILLED_SYRINGE | SUBCUTANEOUS | Status: AC
  Filled 2017-10-12: qty 0.6

## 2017-10-12 MED ORDER — DOXORUBICIN HCL CHEMO IV INJECTION 2 MG/ML
60.0000 mg/m2 | Freq: Once | INTRAVENOUS | Status: AC
  Administered 2017-10-12: 102 mg via INTRAVENOUS
  Filled 2017-10-12: qty 51

## 2017-10-12 MED ORDER — SODIUM CHLORIDE 0.9% FLUSH
10.0000 mL | INTRAVENOUS | Status: DC | PRN
  Administered 2017-10-12: 10 mL
  Filled 2017-10-12: qty 10

## 2017-10-12 MED ORDER — PROMETHAZINE HCL 25 MG/ML IJ SOLN
INTRAMUSCULAR | Status: AC
  Filled 2017-10-12: qty 1

## 2017-10-12 MED ORDER — SODIUM CHLORIDE 0.9 % IV SOLN
Freq: Once | INTRAVENOUS | Status: AC
  Administered 2017-10-12: 08:00:00 via INTRAVENOUS

## 2017-10-12 MED ORDER — PROMETHAZINE HCL 25 MG/ML IJ SOLN
12.5000 mg | Freq: Four times a day (QID) | INTRAMUSCULAR | Status: DC | PRN
  Administered 2017-10-12: 12.5 mg via INTRAVENOUS

## 2017-10-12 MED ORDER — HEPARIN SOD (PORK) LOCK FLUSH 100 UNIT/ML IV SOLN
500.0000 [IU] | Freq: Once | INTRAVENOUS | Status: AC | PRN
  Administered 2017-10-12: 500 [IU]
  Filled 2017-10-12: qty 5

## 2017-10-12 MED ORDER — FOSAPREPITANT DIMEGLUMINE INJECTION 150 MG
Freq: Once | INTRAVENOUS | Status: AC
  Administered 2017-10-12: 09:00:00 via INTRAVENOUS
  Filled 2017-10-12: qty 5

## 2017-10-12 MED ORDER — PEGFILGRASTIM 6 MG/0.6ML ~~LOC~~ PSKT
6.0000 mg | PREFILLED_SYRINGE | Freq: Once | SUBCUTANEOUS | Status: AC
  Administered 2017-10-12: 6 mg via SUBCUTANEOUS

## 2017-10-12 NOTE — Patient Instructions (Signed)
Cancer Center Discharge Instructions for Patients Receiving Chemotherapy  Today you received the following chemotherapy agents Adriamycin and Cytoxan  To help prevent nausea and vomiting after your treatment, we encourage you to take your nausea medication as directed.  If you develop nausea and vomiting that is not controlled by your nausea medication, call the clinic.   BELOW ARE SYMPTOMS THAT SHOULD BE REPORTED IMMEDIATELY:  *FEVER GREATER THAN 100.5 F  *CHILLS WITH OR WITHOUT FEVER  NAUSEA AND VOMITING THAT IS NOT CONTROLLED WITH YOUR NAUSEA MEDICATION  *UNUSUAL SHORTNESS OF BREATH  *UNUSUAL BRUISING OR BLEEDING  TENDERNESS IN MOUTH AND THROAT WITH OR WITHOUT PRESENCE OF ULCERS  *URINARY PROBLEMS  *BOWEL PROBLEMS  UNUSUAL RASH Items with * indicate a potential emergency and should be followed up as soon as possible.  Feel free to call the clinic should you have any questions or concerns. The clinic phone number is [phone].  Please show the CHEMO ALERT CARD at check-in to the Emergency Department and triage nurse.   

## 2017-10-19 ENCOUNTER — Telehealth: Payer: Self-pay | Admitting: *Deleted

## 2017-10-19 MED ORDER — NYSTATIN 100000 UNIT/ML MT SUSP: 5.0000 mL | Freq: Four times a day (QID) | OROMUCOSAL | 1 refills | Status: DC

## 2017-10-19 NOTE — Telephone Encounter (Signed)
Pt called with c/o continued thrush to mouth. Discussed with Mendel Ryder, NP. Sent prescription in for nystatin oral suspension. Informed pt of prescription. Confirmed appt with Mendel Ryder on 5/30.

## 2017-10-21 ENCOUNTER — Inpatient Hospital Stay: Payer: Medicare Other

## 2017-10-21 ENCOUNTER — Encounter: Payer: Self-pay | Admitting: Adult Health

## 2017-10-21 ENCOUNTER — Inpatient Hospital Stay (HOSPITAL_BASED_OUTPATIENT_CLINIC_OR_DEPARTMENT_OTHER): Payer: Medicare Other | Admitting: Adult Health

## 2017-10-21 VITALS — BP 110/65 | HR 65 | Temp 98.7°F | Resp 16 | Ht 63.0 in | Wt 132.2 lb

## 2017-10-21 DIAGNOSIS — Z171 Estrogen receptor negative status [ER-]: Secondary | ICD-10-CM

## 2017-10-21 DIAGNOSIS — R5383 Other fatigue: Secondary | ICD-10-CM | POA: Diagnosis not present

## 2017-10-21 DIAGNOSIS — B370 Candidal stomatitis: Secondary | ICD-10-CM | POA: Diagnosis not present

## 2017-10-21 DIAGNOSIS — D709 Neutropenia, unspecified: Secondary | ICD-10-CM | POA: Diagnosis not present

## 2017-10-21 DIAGNOSIS — R630 Anorexia: Secondary | ICD-10-CM | POA: Diagnosis not present

## 2017-10-21 DIAGNOSIS — C50412 Malignant neoplasm of upper-outer quadrant of left female breast: Secondary | ICD-10-CM

## 2017-10-21 DIAGNOSIS — Z95828 Presence of other vascular implants and grafts: Secondary | ICD-10-CM

## 2017-10-21 DIAGNOSIS — M810 Age-related osteoporosis without current pathological fracture: Secondary | ICD-10-CM

## 2017-10-21 DIAGNOSIS — J0100 Acute maxillary sinusitis, unspecified: Secondary | ICD-10-CM | POA: Diagnosis not present

## 2017-10-21 DIAGNOSIS — D0511 Intraductal carcinoma in situ of right breast: Secondary | ICD-10-CM

## 2017-10-21 DIAGNOSIS — Z86000 Personal history of in-situ neoplasm of breast: Secondary | ICD-10-CM

## 2017-10-21 DIAGNOSIS — Z5111 Encounter for antineoplastic chemotherapy: Secondary | ICD-10-CM | POA: Diagnosis not present

## 2017-10-21 LAB — CBC WITH DIFFERENTIAL/PLATELET
BASOS PCT: 3 %
Basophils Absolute: 0.1 10*3/uL (ref 0.0–0.1)
EOS PCT: 0 %
Eosinophils Absolute: 0 10*3/uL (ref 0.0–0.5)
HCT: 29 % — ABNORMAL LOW (ref 34.8–46.6)
Hemoglobin: 9.7 g/dL — ABNORMAL LOW (ref 11.6–15.9)
LYMPHS ABS: 0.3 10*3/uL — AB (ref 0.9–3.3)
Lymphocytes Relative: 16 %
MCH: 29.8 pg (ref 25.1–34.0)
MCHC: 33.4 g/dL (ref 31.5–36.0)
MCV: 89.2 fL (ref 79.5–101.0)
MONOS PCT: 19 %
Monocytes Absolute: 0.4 10*3/uL (ref 0.1–0.9)
Neutro Abs: 1.3 10*3/uL — ABNORMAL LOW (ref 1.5–6.5)
Neutrophils Relative %: 62 %
PLATELETS: 81 10*3/uL — AB (ref 145–400)
RBC: 3.25 MIL/uL — ABNORMAL LOW (ref 3.70–5.45)
RDW: 15 % — AB (ref 11.2–14.5)
WBC: 2.1 10*3/uL — ABNORMAL LOW (ref 3.9–10.3)

## 2017-10-21 LAB — COMPREHENSIVE METABOLIC PANEL
ALT: 9 U/L (ref 0–55)
ANION GAP: 9 (ref 3–11)
AST: 13 U/L (ref 5–34)
Albumin: 3.9 g/dL (ref 3.5–5.0)
Alkaline Phosphatase: 75 U/L (ref 40–150)
BUN: 7 mg/dL (ref 7–26)
CALCIUM: 9 mg/dL (ref 8.4–10.4)
CHLORIDE: 106 mmol/L (ref 98–109)
CO2: 23 mmol/L (ref 22–29)
Creatinine, Ser: 0.73 mg/dL (ref 0.60–1.10)
GFR calc non Af Amer: >60 mL/min (ref >60)
Glucose, Bld: 107 mg/dL (ref 70–140)
POTASSIUM: 3.9 mmol/L (ref 3.5–5.1)
Sodium: 138 mmol/L (ref 136–145)
Total Bilirubin: 0.2 mg/dL (ref 0.2–1.2)
Total Protein: 6.6 g/dL (ref 6.4–8.3)

## 2017-10-21 MED ORDER — HEPARIN SOD (PORK) LOCK FLUSH 100 UNIT/ML IV SOLN
500.0000 [IU] | Freq: Once | INTRAVENOUS | Status: AC
  Administered 2017-10-21: 500 [IU]
  Filled 2017-10-21: qty 5

## 2017-10-21 MED ORDER — SODIUM CHLORIDE 0.9 % IV SOLN
1000.0000 mL | Freq: Once | INTRAVENOUS | Status: AC
  Administered 2017-10-21: 1000 mL via INTRAVENOUS

## 2017-10-21 MED ORDER — SODIUM CHLORIDE 0.9% FLUSH
10.0000 mL | Freq: Once | INTRAVENOUS | Status: AC
  Administered 2017-10-21: 10 mL
  Filled 2017-10-21: qty 10

## 2017-10-21 NOTE — Progress Notes (Signed)
[facility]  [phone] [phone]     ID: Stephanie Bautista DOB: [birth year]  MR#: [id number]  HWE#:[id number]  Patient Care Team: Haywood Pao, MD as PCP - General (Internal Medicine) Magrinat, Virgie Dad, MD as Consulting Physician (Oncology) Rolm Bookbinder, MD as Consulting Physician (General Surgery) Maisie Fus, MD as Consulting Physician (Obstetrics and Gynecology) Delrae Rend, MD as Consulting Physician (Endocrinology) Verner Chol, MD as Consulting Physician (Sports Medicine) Lavonia Dana, MD as Referring Physician (Otolaryngology) OTHER MD:  CHIEF COMPLAINT: Triple negative breast cancer  CURRENT TREATMENT: Neoadjuvant chemotherapy   HISTORY OF CURRENT ILLNESS: From the original intake note:  I saw Stephanie Bautista remotely for her history of ductal carcinoma in situ of the right breast, treated with lumpectomy 09/02/1994, with the pathology showing ([id number]) ductal carcinoma in situ.  There was no invasive component.  This was followed by radiation given between 09/21/94 and 11/04/94, with a total of 89381 centigray to the right breast and a total of 604 0 cGy to the tumor bed.  She notes that she didn't take any anti-estrogen therapy at that time.  More recently, Stephanie Bautista had routine screening mammography at [facility] on 07/16/2017 showing a possible abnormality in the left breast.  The breast density was category B.  She underwent left breast ultrasound on 07/16/2017 showing: a 1.5 cm irregular mass in the left breast upper outer quadrant posterior depth is consistent with carcinoma. A 6 mm irregular mass in the left breast upper outer quadrant posterior depth was also highly suggestive for malignancy. A lymph node in the left axillary tail was read as suspicious of malignancy.   Accordingly on 07/22/2017 she proceeded to biopsy of the left breast area in question as well as the suspicious left axillary lymph node. The pathology from this  procedure showed ([id number]): Lymph node, needle/core biopsy, left axilla with no evidence of carcinoma-- concordant. Breast, left, needle core biopsy, invasive ductal carcinoma, grade 3, prognostic indicators significant for: ER, 0% negative and PR, 0% negative. Proliferation marker Ki67 at 50%. HER2 negative signals ratio is 1.36, and the number per cell 1.70 .Breast, left, needle core biopsy, architectural distortion consistent with fibroadenoma--discordant.  She has a bilateral breast MRI scheduled for 07/28/2017.  The patient's subsequent history is as detailed below.  INTERVAL HISTORY: [name] returns today for a follow-up and treatment of her triple negative breast cancer accompanied by her husband. Today is day 8 of cycle 4 of cyclophosphamide and doxorubicin given every 14 days, to be followed by weekly paclitaxel/carboplatin x12.  (Recall she did take an extra week after cycle 2).      REVIEW OF SYSTEMS: Artemisia is doing "ok" today.  She notes that she is fatigued, and weaker than she has been previously.  She has bene struggling with thrush.  She was called in some nystatin and it is improving.  She has stopped taking the Diflucan.  She notes she isn't drinking as much as she should be.  She denies fevers or chills.  She denies any other issues and a detailed ROS is otherwise non contributory.     PAST MEDICAL HISTORY: Past Medical History:  Diagnosis Date  . Acoustic neuroma ([place])   . Adenomatous colon polyp 12/2002  . Arthritis   . Breast cancer ([place]) 1996   right breast cancer in 1996, left breast cancer in 2019  . Cholelithiases   . Esophageal stricture   . Family history of breast cancer   . Family history  of ovarian cancer   . Family history of pancreatic cancer   . Family history of prostate cancer   . GERD (gastroesophageal reflux disease)   . Hemorrhoids   . Hiatal hernia   . History of colon polyps   . Inguinal hernia   . UTI (lower urinary tract infection)   As far as  PMHx she has had intermittent back pain (spinal stenosis) x 2 years,  Osteoporosis, random migraines, early cataracts and early hear loss due to schwannoma.   PAST SURGICAL HISTORY: Past Surgical History:  Procedure Laterality Date  . BREAST LUMPECTOMY    . CESAREAN SECTION     2x  . CHOLECYSTECTOMY  2008  . HEMORRHOID SURGERY  2007  . INGUINAL HERNIA REPAIR  1968  . PARTIAL HYSTERECTOMY  1996  . PORTACATH PLACEMENT Right 08/05/2017   Procedure: INSERTION PORT-A-CATH WITH [place];  Surgeon: Rolm Bookbinder, MD;  Location: [facility];  Service: General;  Laterality: Right;  . TONSILLECTOMY      FAMILY HISTORY Family History  Problem Relation Age of Onset  . Heart disease Mother   . Melanoma Mother        dx in her 99s  . Ovarian cancer Mother 32  . Heart disease Father   . Diabetes Father   . Stroke Father   . Ovarian cancer Sister 72  . Pancreatic cancer Maternal Aunt   . Bone cancer Maternal Uncle   . Breast cancer Paternal Aunt   . Non-Hodgkin's lymphoma Maternal Grandmother   . Heart disease Maternal Grandfather   . Heart disease Paternal Grandmother   . Prostate cancer Paternal Grandfather   . Stroke Maternal Uncle   . Bone cancer Maternal Uncle   . Cancer Maternal Aunt        NOS  . Breast cancer Cousin        daughter of mat aunt with NOS cancer  Her mother died from CHF at age 81. Her father died from cardiac issues at age 21. She doesn't have any brothers, but has 3 sisters. She had one sister and her mother with ovarian cancer. Her sister was age 18 when she was diagnosed with ovarian cancer and her mother was in her late 50's when she was diagnosed with ovarian cancer. Her sister was not tested genetically following this. Her maternal grandmother had non-hodgkins lymphoma. Her paternal grandfather had prostate cancer. Her paternal aunt had breast cancer. Her paternal aunt's daughter had breast cancer. She had several uncles that had prostate cancer that  spread to the bones.   GYNECOLOGIC HISTORY:  No LMP recorded. Patient has had a hysterectomy. Menarche: 67 years old Age at first live birth: 67 years old [place] P2 LMP: hysterectomy at age 3 S/p Hysterectomy with USO (L)  SOCIAL HISTORY: She has been retired from Medical sales representative work since 05/26/2017. Her husband, Kasandra Knudsen has been retired from the [employer] for the past 9 years. Their son is Leroy Sea is 40 and a IT trainer. Their daughter, Adonis Brook lives in [facility], is 7 and works in child care at [employer]. The patient has 6 grandchildren ( 2 boys and 4 girls). She is a Psychologist, forensic.      ADVANCED DIRECTIVES:    HEALTH MAINTENANCE: Social History   Tobacco Use  . Smoking status: Never Smoker  . Smokeless tobacco: Never Used  Substance Use Topics  . Alcohol use: No  . Drug use: No     Colonoscopy: Dr. Fuller Plan.  PAP: Dr Nori Riis  Bone density: at Dr. Verlon Au office/ osteoporosis.    Allergies  Allergen Reactions  . Amoxicillin Itching and Rash    Current Outpatient Medications  Medication Sig Dispense Refill  . acetaminophen (TYLENOL) 500 MG tablet Take 500 mg by mouth every 6 (six) hours as needed.    Marland Kitchen alum & mag hydroxide-simeth (MAALOX/MYLANTA) 200-200-20 MG/5ML suspension Take by mouth every 6 (six) hours as needed for indigestion or heartburn.    . baclofen (LIORESAL) 10 MG tablet Take 10 mg by mouth as needed for muscle spasms.    . ciprofloxacin (CIPRO) 500 MG tablet Take one tablet twice a day for 5 days as directed 30 tablet 1  . clotrimazole (MYCELEX) 10 MG troche Take 1 lozenge (10 mg total) by mouth 4 (four) times daily. 56 lozenge 0  . dexamethasone (DECADRON) 4 MG tablet Take 2 tablets by mouth once a day on the day after chemotherapy and then take 2 tablets two times a day for 2 days. Take with food. 30 tablet 1  . lidocaine-prilocaine (EMLA) cream Apply to affected area once 30 g 3  . LORazepam (ATIVAN) 0.5 MG tablet Take 1 tablet (0.5 mg total) by  mouth at bedtime as needed (Nausea or vomiting). 30 tablet 0  . nystatin (MYCOSTATIN) 100000 UNIT/ML suspension Take 5 mLs (500,000 Units total) by mouth 4 (four) times daily. 240 mL 1  . omeprazole (PRILOSEC) 40 MG capsule Take 1 capsule (40 mg total) by mouth 2 (two) times daily. 60 capsule 11  . predniSONE (DELTASONE) 5 MG tablet 6 tab x 1 day, 5 tab x 1 day, 4 tab x 1 day, 3 tab x 1 day, 2 tab x 1 day, 1 tab x 1 day, stop 21 tablet 0  . prochlorperazine (COMPAZINE) 10 MG tablet Take 1 tablet (10 mg total) by mouth every 6 (six) hours as needed (Nausea or vomiting). 30 tablet 1  . promethazine (PHENERGAN) 12.5 MG tablet 1-2 p.o. every 6 hours as needed for nausea or headache 45 tablet 1  . traMADol (ULTRAM) 50 MG tablet 1-2 tablets every 6 hours as needed for pain 30 tablet 0   No current facility-administered medications for this visit.     OBJECTIVE:  Vitals:   10/21/17 1115  BP: 110/65  Pulse: 65  Resp: 16  Temp: 98.7 F (37.1 C)  SpO2: 100%     Body mass index is 23.42 kg/m.   Wt Readings from Last 3 Encounters:  10/21/17 132 lb 3.2 oz (60 kg)  10/05/17 136 lb 3.2 oz (61.8 kg)  10/04/17 137 lb 3.2 oz (62.2 kg)      ECOG FS:1 - Symptomatic but completely ambulatory GENERAL: Patient is a well appearing female in no acute distress HEENT:  Sclerae anicteric.  Oropharynx clear and moist. No ulcerations or evidence of oropharyngeal candidiasis. Neck is supple.  NODES:  No cervical, supraclavicular, or axillary lymphadenopathy palpated.  BREAST EXAM:  Deferred. LUNGS:  Clear to auscultation bilaterally.  No wheezes or rhonchi. HEART:  Regular rate and rhythm. No murmur appreciated. ABDOMEN:  Soft, nontender.  Positive, normoactive bowel sounds. No organomegaly palpated. MSK:  No focal spinal tenderness to palpation. Full range of motion bilaterally in the upper extremities. EXTREMITIES:  No peripheral edema.   SKIN:  Clear with no obvious rashes or skin changes. No nail  dyscrasia. NEURO:  Nonfocal. Well oriented.  Appropriate affect.    LAB RESULTS:  CMP     Component Value Date/Time  NA 138 10/21/2017 1012   K 3.9 10/21/2017 1012   CL 106 10/21/2017 1012   CO2 23 10/21/2017 1012   GLUCOSE 107 10/21/2017 1012   BUN 7 10/21/2017 1012   CREATININE 0.73 10/21/2017 1012   CREATININE 0.81 07/27/2017 1007   CREATININE 0.69 11/16/2011 1054   CALCIUM 9.0 10/21/2017 1012   PROT 6.6 10/21/2017 1012   ALBUMIN 3.9 10/21/2017 1012   AST 13 10/21/2017 1012   AST 16 07/27/2017 1007   ALT 9 10/21/2017 1012   ALT 12 07/27/2017 1007   ALKPHOS 75 10/21/2017 1012   BILITOT 0.2 10/21/2017 1012   BILITOT 0.6 07/27/2017 1007   GFRNONAA >60 10/21/2017 1012   GFRNONAA >60 07/27/2017 1007   GFRAA >60 10/21/2017 1012   GFRAA >60 07/27/2017 1007    No results found for: TOTALPROTELP, ALBUMINELP, A1GS, A2GS, BETS, BETA2SER, GAMS, MSPIKE, SPEI  No results found for: KPAFRELGTCHN, LAMBDASER, KAPLAMBRATIO  Lab Results  Component Value Date   WBC 2.1 (L) 10/21/2017   NEUTROABS 1.3 (L) 10/21/2017   HGB 9.7 (L) 10/21/2017   HCT 29.0 (L) 10/21/2017   MCV 89.2 10/21/2017   PLT 81 (L) 10/21/2017    _0 @  No results found for: LABCA2  No components found for: [id number]  No results for input(s): INR in the last 168 hours.  No results found for: LABCA2  No results found for: NWG956  No results found for: OZH086  No results found for: VHQ469  No results found for: CA2729  No components found for: HGQUANT  No results found for: CEA1 / No results found for: CEA1   No results found for: AFPTUMOR  No results found for: CHROMOGRNA  No results found for: PSA1  Appointment on 10/21/2017  Component Date Value Ref Range Status  . WBC 10/21/2017 2.1* 3.9 - 10.3 K/uL Final  . RBC 10/21/2017 3.25* 3.70 - 5.45 MIL/uL Final  . Hemoglobin 10/21/2017 9.7* 11.6 - 15.9 g/dL Final  . HCT 10/21/2017 29.0* 34.8 - 46.6 % Final  . MCV 10/21/2017 89.2   79.5 - 101.0 fL Final  . MCH 10/21/2017 29.8  25.1 - 34.0 pg Final  . MCHC 10/21/2017 33.4  31.5 - 36.0 g/dL Final  . RDW 10/21/2017 15.0* 11.2 - 14.5 % Final  . Platelets 10/21/2017 81* 145 - 400 K/uL Final  . Neutrophils Relative % 10/21/2017 62  % Final  . Neutro Abs 10/21/2017 1.3* 1.5 - 6.5 K/uL Final  . Lymphocytes Relative 10/21/2017 16  % Final  . Lymphs Abs 10/21/2017 0.3* 0.9 - 3.3 K/uL Final  . Monocytes Relative 10/21/2017 19  % Final  . Monocytes Absolute 10/21/2017 0.4  0.1 - 0.9 K/uL Final  . Eosinophils Relative 10/21/2017 0  % Final  . Eosinophils Absolute 10/21/2017 0.0  0.0 - 0.5 K/uL Final  . Basophils Relative 10/21/2017 3  % Final  . Basophils Absolute 10/21/2017 0.1  0.0 - 0.1 K/uL Final   Performed at [facility], [place] [address], [place], [place] [address]  . Sodium 10/21/2017 138  136 - 145 mmol/L Final  . Potassium 10/21/2017 3.9  3.5 - 5.1 mmol/L Final  . Chloride 10/21/2017 106  98 - 109 mmol/L Final  . CO2 10/21/2017 23  22 - 29 mmol/L Final  . Glucose, Bld 10/21/2017 107  70 - 140 mg/dL Final  . BUN 10/21/2017 7  7 - 26 mg/dL Final  . Creatinine, Ser 10/21/2017 0.73  0.60 - 1.10 mg/dL Final  .  Calcium 10/21/2017 9.0  8.4 - 10.4 mg/dL Final  . Total Protein 10/21/2017 6.6  6.4 - 8.3 g/dL Final  . Albumin 10/21/2017 3.9  3.5 - 5.0 g/dL Final  . AST 10/21/2017 13  5 - 34 U/L Final  . ALT 10/21/2017 9  0 - 55 U/L Final  . Alkaline Phosphatase 10/21/2017 75  40 - 150 U/L Final  . Total Bilirubin 10/21/2017 0.2  0.2 - 1.2 mg/dL Final  . GFR calc non Af Amer 10/21/2017 >60  >60 mL/min Final  . GFR calc Af Amer 10/21/2017 >60  >60 mL/min Final   Comment: (NOTE) The eGFR has been calculated using the CKD EPI equation. This calculation has not been validated in all clinical situations. eGFR's persistently <60 mL/min signify possible Chronic Kidney Disease.   Georgiann Hahn gap 10/21/2017 9  3 - 11 Final   Performed at [facility], [address] [place], [place] [address]    (this displays the last labs from the last 3 days)  No results found for: TOTALPROTELP, ALBUMINELP, A1GS, A2GS, BETS, BETA2SER, GAMS, MSPIKE, SPEI (this displays SPEP labs)  No results found for: KPAFRELGTCHN, LAMBDASER, KAPLAMBRATIO (kappa/lambda light chains)  No results found for: HGBA, HGBA2QUANT, HGBFQUANT, HGBSQUAN (Hemoglobinopathy evaluation)   No results found for: LDH  No results found for: IRON, TIBC, IRONPCTSAT (Iron and TIBC)  No results found for: FERRITIN  Urinalysis    Component Value Date/Time   BILIRUBINUR neg 04/06/2014 1019   PROTEINUR neg 04/06/2014 1019   UROBILINOGEN 0.2 04/06/2014 1019   NITRITE neg 04/06/2014 1019   LEUKOCYTESUR Negative 04/06/2014 1019     STUDIES: No results found.   ELIGIBLE FOR AVAILABLE RESEARCH PROTOCOL: UPBEAT  ASSESSMENT: 67 y.o. [place] woman  (1) status post right lumpectomy 09/02/1994 for ductal carcinoma in situ  (a) status post adjuvant radiation 09/21/1994 through 11/04/1994, total 5040 centigrade to the breast and 604 0 cGy to the tumor bed  (b) did not receive adjuvant antiestrogens  (2) status post left breast upper outer quadrant biopsy 07/22/2017 for a clinical T1c pN0, stage IB invasive ductal carcinoma, grade 3, triple negative, with an MIB-1 of 50%.  (a) lymph node biopsy on the same day was negative for malignancy (concordant)  (b) a second area of architectural distortion was also biopsied, read as fibroadenoma (discordant)  (c) a third area of architectural distortion has not yet been biopsied  (d) breast MRI scheduled for 07/28/2017 shows a clinical T2 N0 tumor  (3) genetics testing 08/02/2017 through the Multi-Gene Panel offered by [facility] found no deleterious mutations in ALK, APC, ATM, AXIN2,BAP1,  BARD1, BLM, BMPR1A, BRCA1, BRCA2, BRIP1, CASR, CDC73, CDH1, CDK4, CDKN1B, CDKN1C, CDKN2A (p14ARF), CDKN2A (p16INK4a), CEBPA, CHEK2,  CTNNA1, DICER1, DIS3L2, EGFR (c.2369C>T, p.Thr790Met variant only), EPCAM (Deletion/duplication testing only), FH, FLCN, GATA2, GPC3, GREM1 (Promoter region deletion/duplication testing only), HOXB13 (c.251G>A, p.Gly84Glu), HRAS, KIT, MAX, MEN1, MET, MITF (c.952G>A, p.Glu318Lys variant only), MLH1, MSH2, MSH3, MSH6, MUTYH, NBN, NF1, NF2, NTHL1, PALB2, PDGFRA, PHOX2B, PMS2, POLD1, POLE, POT1, PRKAR1A, PTCH1, PTEN, RAD50, RAD51C, RAD51D, RB1, RECQL4, RET, RUNX1, SDHAF2, SDHA (sequence changes only), SDHB, SDHC, SDHD, SMAD4, SMARCA4, SMARCB1, SMARCE1, STK11, SUFU, TERT, TERT, TMEM127, TP53, TSC1, TSC2, VHL, WRN and WT1.  The report date is August 02, 2017.  (a) WT1 c.332C>T VUS identified on the Multi-cancer gene panel.    (4) chemotherapy will consist of doxorubicin/ cyclophosphamide in dose dense fashion x4 starting 08/24/2017 followed by paclitaxel/ carboplatin weekly x12  (5) definitive  surgery to follow  (6) adjuvant radiation to follow as appropriate  PLAN: Shalisa is doing moderately well today.  I reviewed her labs with her in detail.  Due to her decreased PO intake, she will receive IV fluids today.  I reviewed that it may help her bounce back quicker.  I recommended that she continue using nystatin bid at least until her next chemotherapy.  She will return in about 2 weeks for labs, f/u and to start Paclitaxel and Carboplatin.  We reviewed possible side effects of the next chemotherapy regimen in detail.    She knows to call for any other issues that may develop before the next visit.  A total of (30) minutes of face-to-face time was spent with this patient with greater than 50% of that time in counseling and care-coordination.   Wilber Bihari, NP  10/21/17 10:06 PM Medical Oncology and Hematology [facility] [address] [place], [place] [address] Tel. [phone]    Fax. [phone]

## 2017-10-22 ENCOUNTER — Telehealth: Payer: Self-pay | Admitting: Adult Health

## 2017-10-22 NOTE — Telephone Encounter (Signed)
No 5/30 los/orders.

## 2017-11-02 ENCOUNTER — Inpatient Hospital Stay: Payer: Medicare Other

## 2017-11-02 ENCOUNTER — Inpatient Hospital Stay: Payer: Medicare Other | Attending: Adult Health | Admitting: Adult Health

## 2017-11-02 ENCOUNTER — Other Ambulatory Visit: Payer: Self-pay | Admitting: Oncology

## 2017-11-02 ENCOUNTER — Encounter: Payer: Self-pay | Admitting: Adult Health

## 2017-11-02 VITALS — BP 149/72 | HR 66 | Temp 98.4°F | Resp 16

## 2017-11-02 VITALS — BP 134/73 | HR 73 | Temp 97.7°F | Resp 18 | Ht 63.0 in | Wt 134.1 lb

## 2017-11-02 DIAGNOSIS — Z86000 Personal history of in-situ neoplasm of breast: Secondary | ICD-10-CM | POA: Diagnosis not present

## 2017-11-02 DIAGNOSIS — C50412 Malignant neoplasm of upper-outer quadrant of left female breast: Secondary | ICD-10-CM

## 2017-11-02 DIAGNOSIS — Z171 Estrogen receptor negative status [ER-]: Secondary | ICD-10-CM

## 2017-11-02 DIAGNOSIS — K1379 Other lesions of oral mucosa: Secondary | ICD-10-CM | POA: Diagnosis not present

## 2017-11-02 DIAGNOSIS — D0511 Intraductal carcinoma in situ of right breast: Secondary | ICD-10-CM

## 2017-11-02 DIAGNOSIS — Z95828 Presence of other vascular implants and grafts: Secondary | ICD-10-CM

## 2017-11-02 DIAGNOSIS — Z5111 Encounter for antineoplastic chemotherapy: Secondary | ICD-10-CM | POA: Insufficient documentation

## 2017-11-02 DIAGNOSIS — M810 Age-related osteoporosis without current pathological fracture: Secondary | ICD-10-CM | POA: Insufficient documentation

## 2017-11-02 LAB — COMPREHENSIVE METABOLIC PANEL
ALBUMIN: 4 g/dL (ref 3.5–5.0)
ALT: 14 U/L (ref 0–55)
ANION GAP: 8 (ref 3–11)
AST: 19 U/L (ref 5–34)
Alkaline Phosphatase: 61 U/L (ref 40–150)
BUN: 9 mg/dL (ref 7–26)
CO2: 25 mmol/L (ref 22–29)
Calcium: 9.2 mg/dL (ref 8.4–10.4)
Chloride: 108 mmol/L (ref 98–109)
Creatinine, Ser: 0.65 mg/dL (ref 0.60–1.10)
Glucose, Bld: 88 mg/dL (ref 70–140)
POTASSIUM: 3.9 mmol/L (ref 3.5–5.1)
Sodium: 141 mmol/L (ref 136–145)
TOTAL PROTEIN: 6.3 g/dL — AB (ref 6.4–8.3)
Total Bilirubin: 0.3 mg/dL (ref 0.2–1.2)

## 2017-11-02 LAB — CBC WITH DIFFERENTIAL/PLATELET
BASOS ABS: 0 10*3/uL (ref 0.0–0.1)
Basophils Relative: 1 %
Eosinophils Absolute: 0 10*3/uL (ref 0.0–0.5)
Eosinophils Relative: 0 %
HEMATOCRIT: 30.1 % — AB (ref 34.8–46.6)
Hemoglobin: 9.8 g/dL — ABNORMAL LOW (ref 11.6–15.9)
LYMPHS PCT: 17 %
Lymphs Abs: 0.6 10*3/uL — ABNORMAL LOW (ref 0.9–3.3)
MCH: 30.1 pg (ref 25.1–34.0)
MCHC: 32.6 g/dL (ref 31.5–36.0)
MCV: 92.3 fL (ref 79.5–101.0)
Monocytes Absolute: 1 10*3/uL — ABNORMAL HIGH (ref 0.1–0.9)
Monocytes Relative: 30 %
NEUTROS ABS: 1.7 10*3/uL (ref 1.5–6.5)
NEUTROS PCT: 52 %
Platelets: 248 10*3/uL (ref 145–400)
RBC: 3.26 MIL/uL — ABNORMAL LOW (ref 3.70–5.45)
RDW: 17 % — ABNORMAL HIGH (ref 11.2–14.5)
WBC: 3.3 10*3/uL — AB (ref 3.9–10.3)

## 2017-11-02 MED ORDER — FAMOTIDINE IN NACL 20-0.9 MG/50ML-% IV SOLN
INTRAVENOUS | Status: AC
  Filled 2017-11-02: qty 50

## 2017-11-02 MED ORDER — DIPHENHYDRAMINE HCL 50 MG/ML IJ SOLN
INTRAMUSCULAR | Status: AC
Start: 2017-11-02 — End: ?
  Filled 2017-11-02: qty 1

## 2017-11-02 MED ORDER — SODIUM CHLORIDE 0.9 % IV SOLN
Freq: Once | INTRAVENOUS | Status: AC
  Administered 2017-11-02: 12:00:00 via INTRAVENOUS

## 2017-11-02 MED ORDER — FAMOTIDINE IN NACL 20-0.9 MG/50ML-% IV SOLN
20.0000 mg | Freq: Once | INTRAVENOUS | Status: AC
  Administered 2017-11-02: 20 mg via INTRAVENOUS

## 2017-11-02 MED ORDER — SODIUM CHLORIDE 0.9 % IV SOLN
80.0000 mg/m2 | Freq: Once | INTRAVENOUS | Status: AC
  Administered 2017-11-02: 138 mg via INTRAVENOUS
  Filled 2017-11-02: qty 23

## 2017-11-02 MED ORDER — PROMETHAZINE HCL 25 MG/ML IJ SOLN
INTRAMUSCULAR | Status: AC
  Filled 2017-11-02: qty 1

## 2017-11-02 MED ORDER — PROMETHAZINE HCL 25 MG/ML IJ SOLN
12.5000 mg | Freq: Four times a day (QID) | INTRAMUSCULAR | Status: DC | PRN
  Administered 2017-11-02: 12.5 mg via INTRAVENOUS

## 2017-11-02 MED ORDER — SODIUM CHLORIDE 0.9% FLUSH
10.0000 mL | INTRAVENOUS | Status: DC | PRN
  Administered 2017-11-02: 10 mL
  Filled 2017-11-02: qty 10

## 2017-11-02 MED ORDER — SODIUM CHLORIDE 0.9% FLUSH
10.0000 mL | Freq: Once | INTRAVENOUS | Status: AC
  Administered 2017-11-02: 10 mL
  Filled 2017-11-02: qty 10

## 2017-11-02 MED ORDER — CARBOPLATIN CHEMO INJECTION 450 MG/45ML
162.0000 mg | Freq: Once | INTRAVENOUS | Status: AC
  Administered 2017-11-02: 160 mg via INTRAVENOUS
  Filled 2017-11-02: qty 16

## 2017-11-02 MED ORDER — SODIUM CHLORIDE 0.9 % IV SOLN
Freq: Once | INTRAVENOUS | Status: AC
  Administered 2017-11-02: 12:00:00 via INTRAVENOUS
  Filled 2017-11-02: qty 5

## 2017-11-02 MED ORDER — DIPHENHYDRAMINE HCL 50 MG/ML IJ SOLN
50.0000 mg | Freq: Once | INTRAMUSCULAR | Status: AC
  Administered 2017-11-02: 50 mg via INTRAVENOUS

## 2017-11-02 MED ORDER — HEPARIN SOD (PORK) LOCK FLUSH 100 UNIT/ML IV SOLN
500.0000 [IU] | Freq: Once | INTRAVENOUS | Status: AC | PRN
  Administered 2017-11-02: 500 [IU]
  Filled 2017-11-02: qty 5

## 2017-11-02 NOTE — Progress Notes (Signed)
[place]  [phone] [phone]     ID: Padme B Scheu DOB: February 23, 1951  MR#: [id number]  DZH#:[id number]  Patient Care Team: Haywood Pao, MD as PCP - General (Internal Medicine) Magrinat, Virgie Dad, MD as Consulting Physician (Oncology) Rolm Bookbinder, MD as Consulting Physician (General Surgery) Maisie Fus, MD as Consulting Physician (Obstetrics and Gynecology) Delrae Rend, MD as Consulting Physician (Endocrinology) Verner Chol, MD as Consulting Physician (Sports Medicine) Lavonia Dana, MD as Referring Physician (Otolaryngology) OTHER MD:  CHIEF COMPLAINT: Triple negative breast cancer  CURRENT TREATMENT: Neoadjuvant chemotherapy   HISTORY OF CURRENT ILLNESS: From the original intake note:  I saw Laysha remotely for her history of ductal carcinoma in situ of the right breast, treated with lumpectomy 09/02/1994, with the pathology showing ([id number]) ductal carcinoma in situ.  There was no invasive component.  This was followed by radiation given between 09/21/94 and 11/04/94, with a total of 41962 centigray to the right breast and a total of 604 0 cGy to the tumor bed.  She notes that she didn't take any anti-estrogen therapy at that time.  More recently, Raphaela had routine screening mammography at [facility] on 07/16/2017 showing a possible abnormality in the left breast.  The breast density was category B.  She underwent left breast ultrasound on 07/16/2017 showing: a 1.5 cm irregular mass in the left breast upper outer quadrant posterior depth is consistent with carcinoma. A 6 mm irregular mass in the left breast upper outer quadrant posterior depth was also highly suggestive for malignancy. A lymph node in the left axillary tail was read as suspicious of malignancy.   Accordingly on 07/22/2017 she proceeded to biopsy of the left breast area in question as well as the suspicious left axillary lymph node. The pathology from this  procedure showed ([id number]): Lymph node, needle/core biopsy, left axilla with no evidence of carcinoma-- concordant. Breast, left, needle core biopsy, invasive ductal carcinoma, grade 3, prognostic indicators significant for: ER, 0% negative and PR, 0% negative. Proliferation marker Ki67 at 50%. HER2 negative signals ratio is 1.36, and the number per cell 1.70 .Breast, left, needle core biopsy, architectural distortion consistent with fibroadenoma--discordant.  She has a bilateral breast MRI scheduled for 07/28/2017.  The patient's subsequent history is as detailed below.  INTERVAL HISTORY: Stephanie Bautista returns today for a follow-up and treatment of her triple negative breast cancer accompanied by her husband. She has completed four cycles of neoadjuvant chemotherapy with Doxorubicin and Cyclophosphamide and will go on to receive weekly Paclitaxel and Carboplatin.  Today is day 1 week 1 of her paclitaxel and Carboplatin treatment.       REVIEW OF SYSTEMS: Navika is doing well today.  She is feeling better.  She saw someone in the treatment room react to Paclitaxel, and so she is nervous about today and starting chemotherapy with it.  She denies any other issues today such as fevers, chills, nausea, vomiting, constipation, diarrhea, cough, chest pain, palpitations, vision issues, mucositis.  The thrush she previously had is resolved.  She says it resolved with the Nystatin.  She did get some oral ulcers with Doxorubicin and Cyclophosphamide, and wants to know what to do about this if this happens again.      PAST MEDICAL HISTORY: Past Medical History:  Diagnosis Date  . Acoustic neuroma ([place])   . Adenomatous colon polyp 12/2002  . Arthritis   . Breast cancer ([place]) 1996   right breast cancer in 1996, left breast cancer  in 2019  . Cholelithiases   . Esophageal stricture   . Family history of breast cancer   . Family history of ovarian cancer   . Family history of pancreatic cancer   . Family history of  prostate cancer   . GERD (gastroesophageal reflux disease)   . Hemorrhoids   . Hiatal hernia   . History of colon polyps   . Inguinal hernia   . UTI (lower urinary tract infection)   As far as PMHx she has had intermittent back pain (spinal stenosis) x 2 years,  Osteoporosis, random migraines, early cataracts and early hear loss due to schwannoma.   PAST SURGICAL HISTORY: Past Surgical History:  Procedure Laterality Date  . BREAST LUMPECTOMY    . CESAREAN SECTION     2x  . CHOLECYSTECTOMY  2008  . HEMORRHOID SURGERY  2007  . INGUINAL HERNIA REPAIR  1968  . PARTIAL HYSTERECTOMY  1996  . PORTACATH PLACEMENT Right 08/05/2017   Procedure: INSERTION PORT-A-CATH WITH [place];  Surgeon: Rolm Bookbinder, MD;  Location: [place];  Service: General;  Laterality: Right;  . TONSILLECTOMY      FAMILY HISTORY Family History  Problem Relation Age of Onset  . Heart disease Mother   . Melanoma Mother        dx in her 67s  . Ovarian cancer Mother 47  . Heart disease Father   . Diabetes Father   . Stroke Father   . Ovarian cancer Sister 22  . Pancreatic cancer Maternal Aunt   . Bone cancer Maternal Uncle   . Breast cancer Paternal Aunt   . Non-Hodgkin's lymphoma Maternal Grandmother   . Heart disease Maternal Grandfather   . Heart disease Paternal Grandmother   . Prostate cancer Paternal Grandfather   . Stroke Maternal Uncle   . Bone cancer Maternal Uncle   . Cancer Maternal Aunt        NOS  . Breast cancer Cousin        daughter of mat aunt with NOS cancer  Her mother died from CHF at age 76. Her father died from cardiac issues at age 21. She doesn't have any brothers, but has 3 sisters. She had one sister and her mother with ovarian cancer. Her sister was age 39 when she was diagnosed with ovarian cancer and her mother was in her late 15's when she was diagnosed with ovarian cancer. Her sister was not tested genetically following this. Her maternal grandmother had  non-hodgkins lymphoma. Her paternal grandfather had prostate cancer. Her paternal aunt had breast cancer. Her paternal aunt's daughter had breast cancer. She had several uncles that had prostate cancer that spread to the bones.   GYNECOLOGIC HISTORY:  No LMP recorded. Patient has had a hysterectomy. Menarche: 67 years old Age at first live birth: 67 years old [place] P2 LMP: hysterectomy at age 48 S/p Hysterectomy with USO (L)  SOCIAL HISTORY: She has been retired from Medical sales representative work since 05/26/2017. Her husband, Kasandra Knudsen has been retired from the [employer] for the past 9 years. Their son is Leroy Sea is 95 and a IT trainer. Their daughter, Adonis Brook lives in [facility], is 45 and works in child care at [employer]. The patient has 6 grandchildren ( 2 boys and 4 girls). She is a Psychologist, forensic.      ADVANCED DIRECTIVES:    HEALTH MAINTENANCE: Social History   Tobacco Use  . Smoking status: Never Smoker  . Smokeless tobacco: Never Used  Substance Use Topics  . Alcohol use: No  . Drug use: No     Colonoscopy: Dr. Fuller Plan.   PAP: Dr Nori Riis  Bone density: at Dr. Verlon Au office/ osteoporosis.    Allergies  Allergen Reactions  . Amoxicillin Itching and Rash    Current Outpatient Medications  Medication Sig Dispense Refill  . acetaminophen (TYLENOL) 500 MG tablet Take 500 mg by mouth every 6 (six) hours as needed.    Marland Kitchen alum & mag hydroxide-simeth (MAALOX/MYLANTA) 200-200-20 MG/5ML suspension Take by mouth every 6 (six) hours as needed for indigestion or heartburn.    . baclofen (LIORESAL) 10 MG tablet Take 10 mg by mouth as needed for muscle spasms.    . ciprofloxacin (CIPRO) 500 MG tablet Take one tablet twice a day for 5 days as directed 30 tablet 1  . clotrimazole (MYCELEX) 10 MG troche Take 1 lozenge (10 mg total) by mouth 4 (four) times daily. 56 lozenge 0  . dexamethasone (DECADRON) 4 MG tablet Take 2 tablets by mouth once a day on the day after chemotherapy and then take 2  tablets two times a day for 2 days. Take with food. 30 tablet 1  . lidocaine-prilocaine (EMLA) cream Apply to affected area once 30 g 3  . LORazepam (ATIVAN) 0.5 MG tablet Take 1 tablet (0.5 mg total) by mouth at bedtime as needed (Nausea or vomiting). 30 tablet 0  . nystatin (MYCOSTATIN) 100000 UNIT/ML suspension Take 5 mLs (500,000 Units total) by mouth 4 (four) times daily. 240 mL 1  . omeprazole (PRILOSEC) 40 MG capsule Take 1 capsule (40 mg total) by mouth 2 (two) times daily. 60 capsule 11  . predniSONE (DELTASONE) 5 MG tablet 6 tab x 1 day, 5 tab x 1 day, 4 tab x 1 day, 3 tab x 1 day, 2 tab x 1 day, 1 tab x 1 day, stop 21 tablet 0  . prochlorperazine (COMPAZINE) 10 MG tablet Take 1 tablet (10 mg total) by mouth every 6 (six) hours as needed (Nausea or vomiting). 30 tablet 1  . promethazine (PHENERGAN) 12.5 MG tablet 1-2 p.o. every 6 hours as needed for nausea or headache 45 tablet 1  . traMADol (ULTRAM) 50 MG tablet 1-2 tablets every 6 hours as needed for pain 30 tablet 0   No current facility-administered medications for this visit.     OBJECTIVE:  Vitals:   11/02/17 1029  BP: 134/73  Pulse: 73  Resp: 18  Temp: 97.7 F (36.5 C)  SpO2: 100%     Body mass index is 23.75 kg/m.   Wt Readings from Last 3 Encounters:  11/02/17 134 lb 1.6 oz (60.8 kg)  10/21/17 132 lb 3.2 oz (60 kg)  10/05/17 136 lb 3.2 oz (61.8 kg)      ECOG FS:1 - Symptomatic but completely ambulatory GENERAL: Patient is a well appearing female in no acute distress HEENT:  Sclerae anicteric.  Oropharynx clear and moist. No ulcerations or evidence of oropharyngeal candidiasis. Neck is supple.  NODES:  No cervical, supraclavicular, or axillary lymphadenopathy palpated.  BREAST EXAM:  Small, soft approx 1.5cm nodule in left upper outer breast LUNGS:  Clear to auscultation bilaterally.  No wheezes or rhonchi. HEART:  Regular rate and rhythm. No murmur appreciated. ABDOMEN:  Soft, nontender.  Positive, normoactive  bowel sounds. No organomegaly palpated. MSK:  No focal spinal tenderness to palpation. Full range of motion bilaterally in the upper extremities. EXTREMITIES:  No peripheral edema.   SKIN:  Clear with  no obvious rashes or skin changes. No nail dyscrasia. NEURO:  Nonfocal. Well oriented.  Appropriate affect.    LAB RESULTS:  CMP     Component Value Date/Time   NA 138 10/21/2017 1012   K 3.9 10/21/2017 1012   CL 106 10/21/2017 1012   CO2 23 10/21/2017 1012   GLUCOSE 107 10/21/2017 1012   BUN 7 10/21/2017 1012   CREATININE 0.73 10/21/2017 1012   CREATININE 0.81 07/27/2017 1007   CREATININE 0.69 11/16/2011 1054   CALCIUM 9.0 10/21/2017 1012   PROT 6.6 10/21/2017 1012   ALBUMIN 3.9 10/21/2017 1012   AST 13 10/21/2017 1012   AST 16 07/27/2017 1007   ALT 9 10/21/2017 1012   ALT 12 07/27/2017 1007   ALKPHOS 75 10/21/2017 1012   BILITOT 0.2 10/21/2017 1012   BILITOT 0.6 07/27/2017 1007   GFRNONAA >60 10/21/2017 1012   GFRNONAA >60 07/27/2017 1007   GFRAA >60 10/21/2017 1012   GFRAA >60 07/27/2017 1007    No results found for: TOTALPROTELP, ALBUMINELP, A1GS, A2GS, BETS, BETA2SER, GAMS, MSPIKE, SPEI  No results found for: KPAFRELGTCHN, LAMBDASER, KAPLAMBRATIO  Lab Results  Component Value Date   WBC 3.3 (L) 11/02/2017   NEUTROABS 1.7 11/02/2017   HGB 9.8 (L) 11/02/2017   HCT 30.1 (L) 11/02/2017   MCV 92.3 11/02/2017   PLT 248 11/02/2017    '@LASTCHEMISTRY'$ @  No results found for: LABCA2  No components found for: [id number]  No results for input(s): INR in the last 168 hours.  No results found for: LABCA2  No results found for: JWJ191  No results found for: YNW295  No results found for: AOZ308  No results found for: CA2729  No components found for: HGQUANT  No results found for: CEA1 / No results found for: CEA1   No results found for: AFPTUMOR  No results found for: CHROMOGRNA  No results found for: PSA1  Appointment on 11/02/2017  Component Date  Value Ref Range Status  . WBC 11/02/2017 3.3* 3.9 - 10.3 K/uL Final  . RBC 11/02/2017 3.26* 3.70 - 5.45 MIL/uL Final  . Hemoglobin 11/02/2017 9.8* 11.6 - 15.9 g/dL Final  . HCT 11/02/2017 30.1* 34.8 - 46.6 % Final  . MCV 11/02/2017 92.3  79.5 - 101.0 fL Final  . MCH 11/02/2017 30.1  25.1 - 34.0 pg Final  . MCHC 11/02/2017 32.6  31.5 - 36.0 g/dL Final  . RDW 11/02/2017 17.0* 11.2 - 14.5 % Final  . Platelets 11/02/2017 248  145 - 400 K/uL Final  . Neutrophils Relative % 11/02/2017 52  % Final  . Neutro Abs 11/02/2017 1.7  1.5 - 6.5 K/uL Final  . Lymphocytes Relative 11/02/2017 17  % Final  . Lymphs Abs 11/02/2017 0.6* 0.9 - 3.3 K/uL Final  . Monocytes Relative 11/02/2017 30  % Final  . Monocytes Absolute 11/02/2017 1.0* 0.1 - 0.9 K/uL Final  . Eosinophils Relative 11/02/2017 0  % Final  . Eosinophils Absolute 11/02/2017 0.0  0.0 - 0.5 K/uL Final  . Basophils Relative 11/02/2017 1  % Final  . Basophils Absolute 11/02/2017 0.0  0.0 - 0.1 K/uL Final   Performed at [facility], [place] [address] [place], [place] [address]    (this displays the last labs from the last 3 days)  No results found for: TOTALPROTELP, ALBUMINELP, A1GS, A2GS, BETS, BETA2SER, GAMS, MSPIKE, SPEI (this displays SPEP labs)  No results found for: KPAFRELGTCHN, LAMBDASER, KAPLAMBRATIO (kappa/lambda light chains)  No results found for:  HGBA, HGBA2QUANT, HGBFQUANT, HGBSQUAN (Hemoglobinopathy evaluation)   No results found for: LDH  No results found for: IRON, TIBC, IRONPCTSAT (Iron and TIBC)  No results found for: FERRITIN  Urinalysis    Component Value Date/Time   BILIRUBINUR neg 04/06/2014 1019   PROTEINUR neg 04/06/2014 1019   UROBILINOGEN 0.2 04/06/2014 1019   NITRITE neg 04/06/2014 1019   LEUKOCYTESUR Negative 04/06/2014 1019     STUDIES: No results found.   ELIGIBLE FOR AVAILABLE RESEARCH PROTOCOL: UPBEAT  ASSESSMENT: 67 y.o. [place] woman  (1) status post right  lumpectomy 09/02/1994 for ductal carcinoma in situ  (a) status post adjuvant radiation 09/21/1994 through 11/04/1994, total 5040 centigrade to the breast and 604 0 cGy to the tumor bed  (b) did not receive adjuvant antiestrogens  (2) status post left breast upper outer quadrant biopsy 07/22/2017 for a clinical T1c pN0, stage IB invasive ductal carcinoma, grade 3, triple negative, with an MIB-1 of 50%.  (a) lymph node biopsy on the same day was negative for malignancy (concordant)  (b) a second area of architectural distortion was also biopsied, read as fibroadenoma (discordant)  (c) a third area of architectural distortion has not yet been biopsied  (d) breast MRI scheduled for 07/28/2017 shows a clinical T2 N0 tumor  (3) genetics testing 08/02/2017 through the Multi-Gene Panel offered by [facility] found no deleterious mutations in ALK, APC, ATM, AXIN2,BAP1,  BARD1, BLM, BMPR1A, BRCA1, BRCA2, BRIP1, CASR, CDC73, CDH1, CDK4, CDKN1B, CDKN1C, CDKN2A (p14ARF), CDKN2A (p16INK4a), CEBPA, CHEK2, CTNNA1, DICER1, DIS3L2, EGFR (c.2369C>T, p.Thr790Met variant only), EPCAM (Deletion/duplication testing only), FH, FLCN, GATA2, GPC3, GREM1 (Promoter region deletion/duplication testing only), HOXB13 (c.251G>A, p.Gly84Glu), HRAS, KIT, MAX, MEN1, MET, MITF (c.952G>A, p.Glu318Lys variant only), MLH1, MSH2, MSH3, MSH6, MUTYH, NBN, NF1, NF2, NTHL1, PALB2, PDGFRA, PHOX2B, PMS2, POLD1, POLE, POT1, PRKAR1A, PTCH1, PTEN, RAD50, RAD51C, RAD51D, RB1, RECQL4, RET, RUNX1, SDHAF2, SDHA (sequence changes only), SDHB, SDHC, SDHD, SMAD4, SMARCA4, SMARCB1, SMARCE1, STK11, SUFU, TERT, TERT, TMEM127, TP53, TSC1, TSC2, VHL, WRN and WT1.  The report date is August 02, 2017.  (a) WT1 c.332C>T VUS identified on the Multi-cancer gene panel.    (4) chemotherapy will consist of doxorubicin/ cyclophosphamide in dose dense fashion x4 starting 08/24/2017 followed by paclitaxel/ carboplatin weekly x12  (5) definitive surgery to follow  (6)  adjuvant radiation to follow as appropriate  PLAN: Atha is doing well today.  Her CBC is stable.  I reviewed her treatment plan with her and the common side effects, risks/benefits of the Paclitaxel/Carboplatin with her.  We extensively discussed peripheral neuropathy, nail dyscrasia, myelosuppression, nausea, vomiting, first dose sensitivity reaction, mucositis.  We reviewed her anti emetics and how to take them.  She will continue on nystatin for now, and we will see how her mouth is doing next week before stopping that, or starting magic mouthwash or anything else for oral ulcers.  She is in agreement and will proceed with her first chemotherapy treatment with Paclitaxel and Carboplatin.  I reviewed her with Dr. Jana Hakim, who will review and sign her chemotherapy orders.   Ceclia will return in one week for labs, f/u, and her next chemotherapy.  She knows to call for any other issues that may develop before the next visit.  A total of (30) minutes of face-to-face time was spent with this patient with greater than 50% of that time in counseling and care-coordination.   Wilber Bihari, NP  11/02/17 10:49 AM Medical Oncology and Hematology [facility] [place],  [place] [address] Tel. [phone]    Fax. [phone]

## 2017-11-02 NOTE — Patient Instructions (Signed)
[facility] Discharge Instructions for Patients Receiving Chemotherapy  Today you received the following chemotherapy agents Taxol, Carboplatin. To help prevent nausea and vomiting after your treatment, we encourage you to take your nausea medication as directed.  If you develop nausea and vomiting that is not controlled by your nausea medication, call the clinic.   BELOW ARE SYMPTOMS THAT SHOULD BE REPORTED IMMEDIATELY:  *FEVER GREATER THAN 100.5 F  *CHILLS WITH OR WITHOUT FEVER  NAUSEA AND VOMITING THAT IS NOT CONTROLLED WITH YOUR NAUSEA MEDICATION  *UNUSUAL SHORTNESS OF BREATH  *UNUSUAL BRUISING OR BLEEDING  TENDERNESS IN MOUTH AND THROAT WITH OR WITHOUT PRESENCE OF ULCERS  *URINARY PROBLEMS  *BOWEL PROBLEMS  UNUSUAL RASH Items with * indicate a potential emergency and should be followed up as soon as possible.  Feel free to call the clinic should you have any questions or concerns. The clinic phone number is [phone].  Please show the CHEMO ALERT CARD at check-in to the Emergency Department and triage nurse. Paclitaxel injection What is this medicine? PACLITAXEL (PAK li TAX el) is a chemotherapy drug. It targets fast dividing cells, like cancer cells, and causes these cells to die. This medicine is used to treat ovarian cancer, breast cancer, and other cancers. This medicine may be used for other purposes; ask your health care provider or pharmacist if you have questions. COMMON BRAND NAME(S): Onxol, Taxol What should I tell my health care provider before I take this medicine? They need to know if you have any of these conditions: -blood disorders -irregular heartbeat -infection (especially a virus infection such as chickenpox, cold sores, or herpes) -liver disease -previous or ongoing radiation therapy -an unusual or allergic reaction to paclitaxel, alcohol, polyoxyethylated castor oil, other chemotherapy agents, other medicines, foods, dyes, or  preservatives -pregnant or trying to get pregnant -breast-feeding How should I use this medicine? This drug is given as an infusion into a vein. It is administered in a hospital or clinic by a specially trained health care professional. Talk to your pediatrician regarding the use of this medicine in children. Special care may be needed. Overdosage: If you think you have taken too much of this medicine contact a poison control center or emergency room at once. NOTE: This medicine is only for you. Do not share this medicine with others. What if I miss a dose? It is important not to miss your dose. Call your doctor or health care professional if you are unable to keep an appointment. What may interact with this medicine? Do not take this medicine with any of the following medications: -disulfiram -metronidazole This medicine may also interact with the following medications: -cyclosporine -diazepam -ketoconazole -medicines to increase blood counts like filgrastim, pegfilgrastim, sargramostim -other chemotherapy drugs like cisplatin, doxorubicin, epirubicin, etoposide, teniposide, vincristine -quinidine -testosterone -vaccines -verapamil Talk to your doctor or health care professional before taking any of these medicines: -acetaminophen -aspirin -ibuprofen -ketoprofen -naproxen This list may not describe all possible interactions. Give your health care provider a list of all the medicines, herbs, non-prescription drugs, or dietary supplements you use. Also tell them if you smoke, drink alcohol, or use illegal drugs. Some items may interact with your medicine. What should I watch for while using this medicine? Your condition will be monitored carefully while you are receiving this medicine. You will need important blood work done while you are taking this medicine. This medicine can cause serious allergic reactions. To reduce your risk you will need to take other medicine(s)   before  treatment with this medicine. If you experience allergic reactions like skin rash, itching or hives, swelling of the face, lips, or tongue, tell your doctor or health care professional right away. In some cases, you may be given additional medicines to help with side effects. Follow all directions for their use. This drug may make you feel generally unwell. This is not uncommon, as chemotherapy can affect healthy cells as well as cancer cells. Report any side effects. Continue your course of treatment even though you feel ill unless your doctor tells you to stop. Call your doctor or health care professional for advice if you get a fever, chills or sore throat, or other symptoms of a cold or flu. Do not treat yourself. This drug decreases your body's ability to fight infections. Try to avoid being around people who are sick. This medicine may increase your risk to bruise or bleed. Call your doctor or health care professional if you notice any unusual bleeding. Be careful brushing and flossing your teeth or using a toothpick because you may get an infection or bleed more easily. If you have any dental work done, tell your dentist you are receiving this medicine. Avoid taking products that contain aspirin, acetaminophen, ibuprofen, naproxen, or ketoprofen unless instructed by your doctor. These medicines may hide a fever. Do not become pregnant while taking this medicine. Women should inform their doctor if they wish to become pregnant or think they might be pregnant. There is a potential for serious side effects to an unborn child. Talk to your health care professional or pharmacist for more information. Do not breast-feed an infant while taking this medicine. Men are advised not to father a child while receiving this medicine. This product may contain alcohol. Ask your pharmacist or healthcare provider if this medicine contains alcohol. Be sure to tell all healthcare providers you are taking this medicine.  Certain medicines, like metronidazole and disulfiram, can cause an unpleasant reaction when taken with alcohol. The reaction includes flushing, headache, nausea, vomiting, sweating, and increased thirst. The reaction can last from 30 minutes to several hours. What side effects may I notice from receiving this medicine? Side effects that you should report to your doctor or health care professional as soon as possible: -allergic reactions like skin rash, itching or hives, swelling of the face, lips, or tongue -low blood counts - This drug may decrease the number of white blood cells, red blood cells and platelets. You may be at increased risk for infections and bleeding. -signs of infection - fever or chills, cough, sore throat, pain or difficulty passing urine -signs of decreased platelets or bleeding - bruising, pinpoint red spots on the skin, black, tarry stools, nosebleeds -signs of decreased red blood cells - unusually weak or tired, fainting spells, lightheadedness -breathing problems -chest pain -high or low blood pressure -mouth sores -nausea and vomiting -pain, swelling, redness or irritation at the injection site -pain, tingling, numbness in the hands or feet -slow or irregular heartbeat -swelling of the ankle, feet, hands Side effects that usually do not require medical attention (report to your doctor or health care professional if they continue or are bothersome): -bone pain -complete hair loss including hair on your head, underarms, pubic hair, eyebrows, and eyelashes -changes in the color of fingernails -diarrhea -loosening of the fingernails -loss of appetite -muscle or joint pain -red flush to skin -sweating This list may not describe all possible side effects. Call your doctor for medical advice about side effects. You   may report side effects to FDA at 1-800-FDA-1088. Where should I keep my medicine? This drug is given in a hospital or clinic and will not be stored at  home. NOTE: This sheet is a summary. It may not cover all possible information. If you have questions about this medicine, talk to your doctor, pharmacist, or health care provider.  2018 [employer] Standard (2015-03-12 19:58:00) Carboplatin injection What is this medicine? CARBOPLATIN (KAR boe pla tin) is a chemotherapy drug. It targets fast dividing cells, like cancer cells, and causes these cells to die. This medicine is used to treat ovarian cancer and many other cancers. This medicine may be used for other purposes; ask your health care provider or pharmacist if you have questions. COMMON BRAND NAME(S): Paraplatin What should I tell my health care provider before I take this medicine? They need to know if you have any of these conditions: -blood disorders -hearing problems -kidney disease -recent or ongoing radiation therapy -an unusual or allergic reaction to carboplatin, cisplatin, other chemotherapy, other medicines, foods, dyes, or preservatives -pregnant or trying to get pregnant -breast-feeding How should I use this medicine? This drug is usually given as an infusion into a vein. It is administered in a hospital or clinic by a specially trained health care professional. Talk to your pediatrician regarding the use of this medicine in children. Special care may be needed. Overdosage: If you think you have taken too much of this medicine contact a poison control center or emergency room at once. NOTE: This medicine is only for you. Do not share this medicine with others. What if I miss a dose? It is important not to miss a dose. Call your doctor or health care professional if you are unable to keep an appointment. What may interact with this medicine? -medicines for seizures -medicines to increase blood counts like filgrastim, pegfilgrastim, sargramostim -some antibiotics like amikacin, gentamicin, neomycin, streptomycin, tobramycin -vaccines Talk to your doctor or health care  professional before taking any of these medicines: -acetaminophen -aspirin -ibuprofen -ketoprofen -naproxen This list may not describe all possible interactions. Give your health care provider a list of all the medicines, herbs, non-prescription drugs, or dietary supplements you use. Also tell them if you smoke, drink alcohol, or use illegal drugs. Some items may interact with your medicine. What should I watch for while using this medicine? Your condition will be monitored carefully while you are receiving this medicine. You will need important blood work done while you are taking this medicine. This drug may make you feel generally unwell. This is not uncommon, as chemotherapy can affect healthy cells as well as cancer cells. Report any side effects. Continue your course of treatment even though you feel ill unless your doctor tells you to stop. In some cases, you may be given additional medicines to help with side effects. Follow all directions for their use. Call your doctor or health care professional for advice if you get a fever, chills or sore throat, or other symptoms of a cold or flu. Do not treat yourself. This drug decreases your body's ability to fight infections. Try to avoid being around people who are sick. This medicine may increase your risk to bruise or bleed. Call your doctor or health care professional if you notice any unusual bleeding. Be careful brushing and flossing your teeth or using a toothpick because you may get an infection or bleed more easily. If you have any dental work done, tell your dentist you are receiving this medicine.   Avoid taking products that contain aspirin, acetaminophen, ibuprofen, naproxen, or ketoprofen unless instructed by your doctor. These medicines may hide a fever. Do not become pregnant while taking this medicine. Women should inform their doctor if they wish to become pregnant or think they might be pregnant. There is a potential for serious side  effects to an unborn child. Talk to your health care professional or pharmacist for more information. Do not breast-feed an infant while taking this medicine. What side effects may I notice from receiving this medicine? Side effects that you should report to your doctor or health care professional as soon as possible: -allergic reactions like skin rash, itching or hives, swelling of the face, lips, or tongue -signs of infection - fever or chills, cough, sore throat, pain or difficulty passing urine -signs of decreased platelets or bleeding - bruising, pinpoint red spots on the skin, black, tarry stools, nosebleeds -signs of decreased red blood cells - unusually weak or tired, fainting spells, lightheadedness -breathing problems -changes in hearing -changes in vision -chest pain -high blood pressure -low blood counts - This drug may decrease the number of white blood cells, red blood cells and platelets. You may be at increased risk for infections and bleeding. -nausea and vomiting -pain, swelling, redness or irritation at the injection site -pain, tingling, numbness in the hands or feet -problems with balance, talking, walking -trouble passing urine or change in the amount of urine Side effects that usually do not require medical attention (report to your doctor or health care professional if they continue or are bothersome): -hair loss -loss of appetite -metallic taste in the mouth or changes in taste This list may not describe all possible side effects. Call your doctor for medical advice about side effects. You may report side effects to FDA at 1-800-FDA-1088. Where should I keep my medicine? This drug is given in a hospital or clinic and will not be stored at home. NOTE: This sheet is a summary. It may not cover all possible information. If you have questions about this medicine, talk to your doctor, pharmacist, or health care provider.  2018 [employer] Standard (2007-08-16  14:38:05)    

## 2017-11-09 ENCOUNTER — Inpatient Hospital Stay: Payer: Medicare Other

## 2017-11-09 ENCOUNTER — Encounter: Payer: Self-pay | Admitting: Adult Health

## 2017-11-09 ENCOUNTER — Inpatient Hospital Stay (HOSPITAL_BASED_OUTPATIENT_CLINIC_OR_DEPARTMENT_OTHER): Payer: Medicare Other | Admitting: Adult Health

## 2017-11-09 ENCOUNTER — Other Ambulatory Visit: Payer: Medicare Other

## 2017-11-09 VITALS — BP 146/76 | HR 80 | Temp 98.3°F | Resp 18 | Ht 63.0 in | Wt 133.0 lb

## 2017-11-09 DIAGNOSIS — B370 Candidal stomatitis: Secondary | ICD-10-CM

## 2017-11-09 DIAGNOSIS — R52 Pain, unspecified: Secondary | ICD-10-CM | POA: Diagnosis not present

## 2017-11-09 DIAGNOSIS — Z171 Estrogen receptor negative status [ER-]: Secondary | ICD-10-CM

## 2017-11-09 DIAGNOSIS — N63 Unspecified lump in unspecified breast: Secondary | ICD-10-CM | POA: Diagnosis not present

## 2017-11-09 DIAGNOSIS — M810 Age-related osteoporosis without current pathological fracture: Secondary | ICD-10-CM

## 2017-11-09 DIAGNOSIS — Z86000 Personal history of in-situ neoplasm of breast: Secondary | ICD-10-CM | POA: Diagnosis not present

## 2017-11-09 DIAGNOSIS — K1379 Other lesions of oral mucosa: Secondary | ICD-10-CM | POA: Diagnosis not present

## 2017-11-09 DIAGNOSIS — D0511 Intraductal carcinoma in situ of right breast: Secondary | ICD-10-CM

## 2017-11-09 DIAGNOSIS — C50412 Malignant neoplasm of upper-outer quadrant of left female breast: Secondary | ICD-10-CM

## 2017-11-09 DIAGNOSIS — Z5111 Encounter for antineoplastic chemotherapy: Secondary | ICD-10-CM | POA: Diagnosis not present

## 2017-11-09 DIAGNOSIS — Z95828 Presence of other vascular implants and grafts: Secondary | ICD-10-CM

## 2017-11-09 LAB — COMPREHENSIVE METABOLIC PANEL
ALBUMIN: 3.8 g/dL (ref 3.5–5.0)
ALT: 14 U/L (ref 0–55)
ANION GAP: 9 (ref 3–11)
AST: 17 U/L (ref 5–34)
Alkaline Phosphatase: 57 U/L (ref 40–150)
BILIRUBIN TOTAL: 0.3 mg/dL (ref 0.2–1.2)
BUN: 8 mg/dL (ref 7–26)
CHLORIDE: 109 mmol/L (ref 98–109)
CO2: 24 mmol/L (ref 22–29)
Calcium: 9.1 mg/dL (ref 8.4–10.4)
Creatinine, Ser: 0.65 mg/dL (ref 0.60–1.10)
GFR calc Af Amer: >60 mL/min (ref >60)
GFR calc non Af Amer: >60 mL/min (ref >60)
GLUCOSE: 91 mg/dL (ref 70–140)
POTASSIUM: 3.6 mmol/L (ref 3.5–5.1)
SODIUM: 142 mmol/L (ref 136–145)
TOTAL PROTEIN: 6.1 g/dL — AB (ref 6.4–8.3)

## 2017-11-09 LAB — CBC WITH DIFFERENTIAL/PLATELET
BASOS ABS: 0.1 10*3/uL (ref 0.0–0.1)
Basophils Relative: 3 %
EOS PCT: 1 %
Eosinophils Absolute: 0 10*3/uL (ref 0.0–0.5)
HCT: 28.8 % — ABNORMAL LOW (ref 34.8–46.6)
Hemoglobin: 9.7 g/dL — ABNORMAL LOW (ref 11.6–15.9)
LYMPHS ABS: 0.4 10*3/uL — AB (ref 0.9–3.3)
LYMPHS PCT: 20 %
MCH: 30.7 pg (ref 25.1–34.0)
MCHC: 33.8 g/dL (ref 31.5–36.0)
MCV: 90.8 fL (ref 79.5–101.0)
MONO ABS: 0.3 10*3/uL (ref 0.1–0.9)
Monocytes Relative: 16 %
NEUTROS ABS: 1.1 10*3/uL — AB (ref 1.5–6.5)
Neutrophils Relative %: 60 %
Platelets: 191 10*3/uL (ref 145–400)
RBC: 3.17 MIL/uL — ABNORMAL LOW (ref 3.70–5.45)
RDW: 17.1 % — AB (ref 11.2–14.5)
WBC: 1.9 10*3/uL — ABNORMAL LOW (ref 3.9–10.3)

## 2017-11-09 MED ORDER — PEGFILGRASTIM 6 MG/0.6ML ~~LOC~~ PSKT
6.0000 mg | PREFILLED_SYRINGE | Freq: Once | SUBCUTANEOUS | Status: AC
  Administered 2017-11-09: 6 mg via SUBCUTANEOUS

## 2017-11-09 MED ORDER — SODIUM CHLORIDE 0.9 % IV SOLN
Freq: Once | INTRAVENOUS | Status: AC
  Administered 2017-11-09: 10:00:00 via INTRAVENOUS
  Filled 2017-11-09: qty 5

## 2017-11-09 MED ORDER — HEPARIN SOD (PORK) LOCK FLUSH 100 UNIT/ML IV SOLN
500.0000 [IU] | Freq: Once | INTRAVENOUS | Status: AC | PRN
  Administered 2017-11-09: 500 [IU]
  Filled 2017-11-09: qty 5

## 2017-11-09 MED ORDER — SODIUM CHLORIDE 0.9% FLUSH
10.0000 mL | Freq: Once | INTRAVENOUS | Status: AC
  Administered 2017-11-09: 10 mL
  Filled 2017-11-09: qty 10

## 2017-11-09 MED ORDER — SODIUM CHLORIDE 0.9 % IV SOLN
80.0000 mg/m2 | Freq: Once | INTRAVENOUS | Status: AC
  Administered 2017-11-09: 138 mg via INTRAVENOUS
  Filled 2017-11-09: qty 23

## 2017-11-09 MED ORDER — SODIUM CHLORIDE 0.9 % IV SOLN
Freq: Once | INTRAVENOUS | Status: AC
  Administered 2017-11-09: 10:00:00 via INTRAVENOUS

## 2017-11-09 MED ORDER — SODIUM CHLORIDE 0.9% FLUSH
10.0000 mL | INTRAVENOUS | Status: DC | PRN
  Filled 2017-11-09: qty 10

## 2017-11-09 MED ORDER — PROMETHAZINE HCL 25 MG/ML IJ SOLN
INTRAMUSCULAR | Status: AC
Start: 2017-11-09 — End: ?
  Filled 2017-11-09: qty 1

## 2017-11-09 MED ORDER — FAMOTIDINE IN NACL 20-0.9 MG/50ML-% IV SOLN
INTRAVENOUS | Status: AC
  Filled 2017-11-09: qty 50

## 2017-11-09 MED ORDER — DIPHENHYDRAMINE HCL 50 MG/ML IJ SOLN
25.0000 mg | Freq: Once | INTRAMUSCULAR | Status: AC
  Administered 2017-11-09: 25 mg via INTRAVENOUS

## 2017-11-09 MED ORDER — DIPHENHYDRAMINE HCL 50 MG/ML IJ SOLN
INTRAMUSCULAR | Status: AC
  Filled 2017-11-09: qty 1

## 2017-11-09 MED ORDER — FAMOTIDINE IN NACL 20-0.9 MG/50ML-% IV SOLN
20.0000 mg | Freq: Once | INTRAVENOUS | Status: AC
  Administered 2017-11-09: 20 mg via INTRAVENOUS

## 2017-11-09 MED ORDER — SODIUM CHLORIDE 0.9 % IV SOLN
162.0000 mg | Freq: Once | INTRAVENOUS | Status: AC
  Administered 2017-11-09: 160 mg via INTRAVENOUS
  Filled 2017-11-09: qty 16

## 2017-11-09 MED ORDER — PEGFILGRASTIM 6 MG/0.6ML ~~LOC~~ PSKT
PREFILLED_SYRINGE | SUBCUTANEOUS | Status: AC
  Filled 2017-11-09: qty 0.6

## 2017-11-09 MED ORDER — PROMETHAZINE HCL 25 MG/ML IJ SOLN
12.5000 mg | Freq: Once | INTRAMUSCULAR | Status: AC
  Administered 2017-11-09: 12.5 mg via INTRAVENOUS

## 2017-11-09 NOTE — Progress Notes (Addendum)
[facility]  [phone] [phone]     ID: Stephanie Bautista DOB: 11/28/1950  MR#: [id number]  CSN#:[id number]  Patient Care Team: Tisovec, Richard W, MD as PCP - General (Internal Medicine) Magrinat, Gustav C, MD as Consulting Physician (Oncology) Wakefield, Matthew, MD as Consulting Physician (General Surgery) Neal, W Ronald, MD as Consulting Physician (Obstetrics and Gynecology) Kerr, Jeffrey, MD as Consulting Physician (Endocrinology) Bassett, Rebecca S, MD as Consulting Physician (Sports Medicine) Gandolfi, Michelle, MD as Referring Physician (Otolaryngology) OTHER MD:  CHIEF COMPLAINT: Triple negative breast cancer  CURRENT TREATMENT: Neoadjuvant chemotherapy   HISTORY OF CURRENT ILLNESS: From the original intake note:  I saw Stephanie Bautista remotely for her history of ductal carcinoma in situ of the right breast, treated with lumpectomy 09/02/1994, with the pathology showing ([id number]) ductal carcinoma in situ.  There was no invasive component.  This was followed by radiation given between 09/21/94 and 11/04/94, with a total of 50400 centigray to the right breast and a total of 604 0 cGy to the tumor bed.  She notes that she didn't take any anti-estrogen therapy at that time.  More recently, Stephanie Bautista had routine screening mammography at [facility] on 07/16/2017 showing a possible abnormality in the left breast.  The breast density was category B.  She underwent left breast ultrasound on 07/16/2017 showing: a 1.5 cm irregular mass in the left breast upper outer quadrant posterior depth is consistent with carcinoma. A 6 mm irregular mass in the left breast upper outer quadrant posterior depth was also highly suggestive for malignancy. A lymph node in the left axillary tail was read as suspicious of malignancy.   Accordingly on 07/22/2017 she proceeded to biopsy of the left breast area in question as well as the suspicious left axillary lymph node. The pathology from this  procedure showed ([id number]): Lymph node, needle/core biopsy, left axilla with no evidence of carcinoma-- concordant. Breast, left, needle core biopsy, invasive ductal carcinoma, grade 3, prognostic indicators significant for: ER, 0% negative and PR, 0% negative. Proliferation marker Ki67 at 50%. HER2 negative signals ratio is 1.36, and the number per cell 1.70 .Breast, left, needle core biopsy, architectural distortion consistent with fibroadenoma--discordant.  She has a bilateral breast MRI scheduled for 07/28/2017.  The patient's subsequent history is as detailed below.  INTERVAL HISTORY: Stephanie Bautista returns today for a follow-up and treatment of her triple negative breast cancer accompanied by her husband. She has completed four cycles of neoadjuvant chemotherapy with Doxorubicin and Cyclophosphamide and is here today to receive her second weekly Taxol/Carboplatin dose.      REVIEW OF SYSTEMS: Stephanie Bautista is doing well today.  She noticed striking pains while getting the treatment and throughout the week.  It started when she received the chemotherapy.  The pain was located in her ear, down the back of her head, and in her stomach/pelvis, and down her back.  She says the pain typically is a short lived shooting pain, however the stomach pain lasted for a few hours on Sunday.  She also noted bone pain/aches.  She says her thrush is being managed by oral nystatin.  She did feel like the benadryl and phenergan made her very sleepy.  She wonders if her benadryl dose can be decreased. She notes that she still feels her breast lump and is concerned that the cancer may still be growing.  She also notes that the day following chemotherapy her face was red, which lasted a couple of days.  Otherwise she is doing   well and a detailed ROS is non contributory.     PAST MEDICAL HISTORY: Past Medical History:  Diagnosis Date  . Acoustic neuroma (HCC)   . Adenomatous colon polyp 12/2002  . Arthritis   . Breast cancer (HCC)  1996   right breast cancer in 1996, left breast cancer in 2019  . Cholelithiases   . Esophageal stricture   . Family history of breast cancer   . Family history of ovarian cancer   . Family history of pancreatic cancer   . Family history of prostate cancer   . GERD (gastroesophageal reflux disease)   . Hemorrhoids   . Hiatal hernia   . History of colon polyps   . Inguinal hernia   . UTI (lower urinary tract infection)   As far as PMHx she has had intermittent back pain (spinal stenosis) x 2 years,  Osteoporosis, random migraines, early cataracts and early hear loss due to schwannoma.   PAST SURGICAL HISTORY: Past Surgical History:  Procedure Laterality Date  . BREAST LUMPECTOMY    . CESAREAN SECTION     2x  . CHOLECYSTECTOMY  2008  . HEMORRHOID SURGERY  2007  . INGUINAL HERNIA REPAIR  1968  . PARTIAL HYSTERECTOMY  1996  . PORTACATH PLACEMENT Right 08/05/2017   Procedure: INSERTION PORT-A-CATH WITH US;  Surgeon: Wakefield, Matthew, MD;  Location: [facility];  Service: General;  Laterality: Right;  . TONSILLECTOMY      FAMILY HISTORY Family History  Problem Relation Age of Onset  . Heart disease Mother   . Melanoma Mother        dx in her 90s  . Ovarian cancer Mother 40  . Heart disease Father   . Diabetes Father   . Stroke Father   . Ovarian cancer Sister 46  . Pancreatic cancer Maternal Aunt   . Bone cancer Maternal Uncle   . Breast cancer Paternal Aunt   . Non-Hodgkin's lymphoma Maternal Grandmother   . Heart disease Maternal Grandfather   . Heart disease Paternal Grandmother   . Prostate cancer Paternal Grandfather   . Stroke Maternal Uncle   . Bone cancer Maternal Uncle   . Cancer Maternal Aunt        NOS  . Breast cancer Cousin        daughter of mat aunt with NOS cancer  Her mother died from CHF at age 93. Her father died from cardiac issues at age 84. She doesn't have any brothers, but has 3 sisters. She had one sister and her mother with  ovarian cancer. Her sister was age 43 when she was diagnosed with ovarian cancer and her mother was in her late 30's when she was diagnosed with ovarian cancer. Her sister was not tested genetically following this. Her maternal grandmother had non-hodgkins lymphoma. Her paternal grandfather had prostate cancer. Her paternal aunt had breast cancer. Her paternal aunt's daughter had breast cancer. She had several uncles that had prostate cancer that spread to the bones.   GYNECOLOGIC HISTORY:  No LMP recorded. Patient has had a hysterectomy. Menarche: 67 years old Age at first live birth: 67 years old GX P2 LMP: hysterectomy at age 46 S/p Hysterectomy with USO (L)  SOCIAL HISTORY: She has been retired from clerical work since 05/26/2017. Her husband, Danny has been retired from the [employer] fire department for the past 9 years. Their son is Brad is 41 and a [employer]. Their daughter, Christie lives in [place], is 33   and works in child care at a church. The patient has 6 grandchildren ( 2 boys and 4 girls). She is a [employer].      ADVANCED DIRECTIVES:    HEALTH MAINTENANCE: Social History   Tobacco Use  . Smoking status: Never Smoker  . Smokeless tobacco: Never Used  Substance Use Topics  . Alcohol use: No  . Drug use: No     Colonoscopy: Dr. Stark.   PAP: Dr Neal  Bone density: at Dr. Neal's office/ osteoporosis.    Allergies  Allergen Reactions  . Amoxicillin Itching and Rash    Current Outpatient Medications  Medication Sig Dispense Refill  . acetaminophen (TYLENOL) 500 MG tablet Take 500 mg by mouth every 6 (six) hours as needed.    . alum & mag hydroxide-simeth (MAALOX/MYLANTA) 200-200-20 MG/5ML suspension Take by mouth every 6 (six) hours as needed for indigestion or heartburn.    . baclofen (LIORESAL) 10 MG tablet Take 10 mg by mouth as needed for muscle spasms.    . ciprofloxacin (CIPRO) 500 MG tablet Take one tablet twice a day for 5 days as directed 30  tablet 1  . clotrimazole (MYCELEX) 10 MG troche Take 1 lozenge (10 mg total) by mouth 4 (four) times daily. 56 lozenge 0  . dexamethasone (DECADRON) 4 MG tablet Take 2 tablets by mouth once a day on the day after chemotherapy and then take 2 tablets two times a day for 2 days. Take with food. 30 tablet 1  . lidocaine-prilocaine (EMLA) cream Apply to affected area once 30 g 3  . LORazepam (ATIVAN) 0.5 MG tablet Take 1 tablet (0.5 mg total) by mouth at bedtime as needed (Nausea or vomiting). 30 tablet 0  . nystatin (MYCOSTATIN) 100000 UNIT/ML suspension Take 5 mLs (500,000 Units total) by mouth 4 (four) times daily. 240 mL 1  . omeprazole (PRILOSEC) 40 MG capsule Take 1 capsule (40 mg total) by mouth 2 (two) times daily. 60 capsule 11  . predniSONE (DELTASONE) 5 MG tablet 6 tab x 1 day, 5 tab x 1 day, 4 tab x 1 day, 3 tab x 1 day, 2 tab x 1 day, 1 tab x 1 day, stop 21 tablet 0  . prochlorperazine (COMPAZINE) 10 MG tablet Take 1 tablet (10 mg total) by mouth every 6 (six) hours as needed (Nausea or vomiting). 30 tablet 1  . promethazine (PHENERGAN) 12.5 MG tablet 1-2 p.o. every 6 hours as needed for nausea or headache 45 tablet 1  . traMADol (ULTRAM) 50 MG tablet 1-2 tablets every 6 hours as needed for pain 30 tablet 0   Current Facility-Administered Medications  Medication Dose Route Frequency Provider Last Rate Last Dose  . sodium chloride flush (NS) 0.9 % injection 10 mL  10 mL Intracatheter Once Magrinat, Gustav C, MD        OBJECTIVE:  Vitals:   11/09/17 0812  BP: (!) 146/76  Pulse: 80  Resp: 18  Temp: 98.3 F (36.8 C)  SpO2: 100%     Body mass index is 23.56 kg/m.   Wt Readings from Last 3 Encounters:  11/09/17 133 lb (60.3 kg)  11/02/17 134 lb 1.6 oz (60.8 kg)  10/21/17 132 lb 3.2 oz (60 kg)  ECOG FS:1 - Symptomatic but completely ambulatory GENERAL: Patient is a well appearing female in no acute distress HEENT:  Sclerae anicteric.  Oropharynx clear and moist. No ulcerations or  evidence of oropharyngeal candidiasis. Neck is supple.  NODES:  No cervical, supraclavicular,   or axillary lymphadenopathy palpated.  BREAST EXAM:  Small, soft approx 1.5cm nodule in left upper outer breast LUNGS:  Clear to auscultation bilaterally.  No wheezes or rhonchi. HEART:  Regular rate and rhythm. No murmur appreciated. ABDOMEN:  Soft, nontender.  Positive, normoactive bowel sounds. No organomegaly palpated. MSK:  No focal spinal tenderness to palpation. Full range of motion bilaterally in the upper extremities. EXTREMITIES:  No peripheral edema.   SKIN:  Clear with no obvious rashes or skin changes. No nail dyscrasia. NEURO:  Nonfocal. Well oriented.  Appropriate affect.    LAB RESULTS:  CMP     Component Value Date/Time   NA 141 11/02/2017 0959   K 3.9 11/02/2017 0959   CL 108 11/02/2017 0959   CO2 25 11/02/2017 0959   GLUCOSE 88 11/02/2017 0959   BUN 9 11/02/2017 0959   CREATININE 0.65 11/02/2017 0959   CREATININE 0.81 07/27/2017 1007   CREATININE 0.69 11/16/2011 1054   CALCIUM 9.2 11/02/2017 0959   PROT 6.3 (L) 11/02/2017 0959   ALBUMIN 4.0 11/02/2017 0959   AST 19 11/02/2017 0959   AST 16 07/27/2017 1007   ALT 14 11/02/2017 0959   ALT 12 07/27/2017 1007   ALKPHOS 61 11/02/2017 0959   BILITOT 0.3 11/02/2017 0959   BILITOT 0.6 07/27/2017 1007   GFRNONAA >60 11/02/2017 0959   GFRNONAA >60 07/27/2017 1007   GFRAA >60 11/02/2017 0959   GFRAA >60 07/27/2017 1007    No results found for: TOTALPROTELP, ALBUMINELP, A1GS, A2GS, BETS, BETA2SER, GAMS, MSPIKE, SPEI  No results found for: KPAFRELGTCHN, LAMBDASER, KAPLAMBRATIO  Lab Results  Component Value Date   WBC 3.3 (L) 11/02/2017   NEUTROABS 1.7 11/02/2017   HGB 9.8 (L) 11/02/2017   HCT 30.1 (L) 11/02/2017   MCV 92.3 11/02/2017   PLT 248 11/02/2017    @LASTCHEMISTRY@  No results found for: LABCA2  No components found for: LABCAN125  No results for input(s): INR in the last 168 hours.  No results  found for: LABCA2  No results found for: CAN199  No results found for: CAN125  No results found for: CAN153  No results found for: CA2729  No components found for: HGQUANT  No results found for: CEA1 / No results found for: CEA1   No results found for: AFPTUMOR  No results found for: CHROMOGRNA  No results found for: PSA1  No visits with results within 3 Day(s) from this visit.  Latest known visit with results is:  Appointment on 11/02/2017  Component Date Value Ref Range Status  . Sodium 11/02/2017 141  136 - 145 mmol/L Final  . Potassium 11/02/2017 3.9  3.5 - 5.1 mmol/L Final  . Chloride 11/02/2017 108  98 - 109 mmol/L Final  . CO2 11/02/2017 25  22 - 29 mmol/L Final  . Glucose, Bld 11/02/2017 88  70 - 140 mg/dL Final  . BUN 11/02/2017 9  7 - 26 mg/dL Final  . Creatinine, Ser 11/02/2017 0.65  0.60 - 1.10 mg/dL Final  . Calcium 11/02/2017 9.2  8.4 - 10.4 mg/dL Final  . Total Protein 11/02/2017 6.3* 6.4 - 8.3 g/dL Final  . Albumin 11/02/2017 4.0  3.5 - 5.0 g/dL Final  . AST 11/02/2017 19  5 - 34 U/L Final  . ALT 11/02/2017 14  0 - 55 U/L Final  . Alkaline Phosphatase 11/02/2017 61  40 - 150 U/L Final  . Total Bilirubin 11/02/2017 0.3  0.2 - 1.2 mg/dL Final  . GFR calc non Af   Amer 11/02/2017 >60  >60 mL/min Final  . GFR calc Af Amer 11/02/2017 >60  >60 mL/min Final   Comment: (NOTE) The eGFR has been calculated using the CKD EPI equation. This calculation has not been validated in all clinical situations. eGFR's persistently <60 mL/min signify possible Chronic Kidney Disease.   Georgiann Hahn gap 11/02/2017 8  3 - 11 Final   Performed at [facility], [place] [address], [place], [place] [address]  . WBC 11/02/2017 3.3* 3.9 - 10.3 K/uL Final  . RBC 11/02/2017 3.26* 3.70 - 5.45 MIL/uL Final  . Hemoglobin 11/02/2017 9.8* 11.6 - 15.9 g/dL Final  . HCT 11/02/2017 30.1* 34.8 - 46.6 % Final  . MCV 11/02/2017 92.3  79.5 - 101.0 fL Final  . MCH 11/02/2017  30.1  25.1 - 34.0 pg Final  . MCHC 11/02/2017 32.6  31.5 - 36.0 g/dL Final  . RDW 11/02/2017 17.0* 11.2 - 14.5 % Final  . Platelets 11/02/2017 248  145 - 400 K/uL Final  . Neutrophils Relative % 11/02/2017 52  % Final  . Neutro Abs 11/02/2017 1.7  1.5 - 6.5 K/uL Final  . Lymphocytes Relative 11/02/2017 17  % Final  . Lymphs Abs 11/02/2017 0.6* 0.9 - 3.3 K/uL Final  . Monocytes Relative 11/02/2017 30  % Final  . Monocytes Absolute 11/02/2017 1.0* 0.1 - 0.9 K/uL Final  . Eosinophils Relative 11/02/2017 0  % Final  . Eosinophils Absolute 11/02/2017 0.0  0.0 - 0.5 K/uL Final  . Basophils Relative 11/02/2017 1  % Final  . Basophils Absolute 11/02/2017 0.0  0.0 - 0.1 K/uL Final   Performed at [facility], [place] [address] [place], [place] [address]    (this displays the last labs from the last 3 days)  No results found for: TOTALPROTELP, ALBUMINELP, A1GS, A2GS, BETS, BETA2SER, GAMS, MSPIKE, SPEI (this displays SPEP labs)  No results found for: KPAFRELGTCHN, LAMBDASER, KAPLAMBRATIO (kappa/lambda light chains)  No results found for: HGBA, HGBA2QUANT, HGBFQUANT, HGBSQUAN (Hemoglobinopathy evaluation)   No results found for: LDH  No results found for: IRON, TIBC, IRONPCTSAT (Iron and TIBC)  No results found for: FERRITIN  Urinalysis    Component Value Date/Time   BILIRUBINUR neg 04/06/2014 1019   PROTEINUR neg 04/06/2014 1019   UROBILINOGEN 0.2 04/06/2014 1019   NITRITE neg 04/06/2014 1019   LEUKOCYTESUR Negative 04/06/2014 1019     STUDIES: No results found.   ELIGIBLE FOR AVAILABLE RESEARCH PROTOCOL: UPBEAT  ASSESSMENT: 67 y.o. [place] woman  (1) status post right lumpectomy 09/02/1994 for ductal carcinoma in situ  (a) status post adjuvant radiation 09/21/1994 through 11/04/1994, total 5040 centigrade to the breast and 604 0 cGy to the tumor bed  (b) did not receive adjuvant antiestrogens  (2) status post left breast upper outer quadrant  biopsy 07/22/2017 for a clinical T1c pN0, stage IB invasive ductal carcinoma, grade 3, triple negative, with an MIB-1 of 50%.  (a) lymph node biopsy on the same day was negative for malignancy (concordant)  (b) a second area of architectural distortion was also biopsied, read as fibroadenoma (discordant)  (c) a third area of architectural distortion has not yet been biopsied  (d) breast MRI scheduled for 07/28/2017 shows a clinical T2 N0 tumor  (3) genetics testing 08/02/2017 through the Multi-Gene Panel offered by [facility] found no deleterious mutations in ALK, APC, ATM, AXIN2,BAP1,  BARD1, BLM, BMPR1A, BRCA1, BRCA2, BRIP1, CASR, CDC73, CDH1, CDK4, CDKN1B, CDKN1C, CDKN2A (p14ARF), CDKN2A (p16INK4a), CEBPA, CHEK2,  CTNNA1, DICER1, DIS3L2, EGFR (c.2369C>T, p.Thr790Met variant only), EPCAM (Deletion/duplication testing only), FH, FLCN, GATA2, GPC3, GREM1 (Promoter region deletion/duplication testing only), HOXB13 (c.251G>A, p.Gly84Glu), HRAS, KIT, MAX, MEN1, MET, MITF (c.952G>A, p.Glu318Lys variant only), MLH1, MSH2, MSH3, MSH6, MUTYH, NBN, NF1, NF2, NTHL1, PALB2, PDGFRA, PHOX2B, PMS2, POLD1, POLE, POT1, PRKAR1A, PTCH1, PTEN, RAD50, RAD51C, RAD51D, RB1, RECQL4, RET, RUNX1, SDHAF2, SDHA (sequence changes only), SDHB, SDHC, SDHD, SMAD4, SMARCA4, SMARCB1, SMARCE1, STK11, SUFU, TERT, TERT, TMEM127, TP53, TSC1, TSC2, VHL, WRN and WT1.  The report date is August 02, 2017.  (a) WT1 c.332C>T VUS identified on the Multi-cancer gene panel.    (4) chemotherapy will consist of doxorubicin/ cyclophosphamide in dose dense fashion x4 starting 08/24/2017 followed by paclitaxel/ carboplatin weekly x12  (5) definitive surgery to follow  (6) adjuvant radiation to follow as appropriate  PLAN: Stephanie Bautista will proceed with treatment today with Paclitaxel and Carboplatin.  She met with Dr. Magrinat today as well.  She will proceed with treatment today.  Her ANC is 1.1 today.  She can get chemotherapy today with this, but we reviewed  that she would need either Granix or Neulasta to support her WBC during chemotherapy.    Stephanie Bautista was given two options: continue chemotherapy weekly with Granix support the Wednesday, Thursday, and Friday following treatment, or receive chemotherapy on day 1 and 8 of a 21 day cycle with Neulasta given on day 9.  She has chosen the option of Paclitaxel and Carboplatin on days 1 and 8 with Neulasta on day 9.  She can receive Onpro.  I reviewed these changes with Stephanie Bautista in Pharmacy.  She is working with me to ensure they are correctly reflected in her chemotherapy regimen.    Stephanie Bautista will receive 25mg of IV benadryl today instead of 50mg.  She will also receive 10mg of IV dexamethasone instead of 20mg.  Hopefully these changes will improve her sleepiness and flushed face.  As far as the aches and pains are concerned, those have been attributed to first dose Paclitaxel aches and pains, and hopefully the second cycle will be better tolerated.    Dr. Magrinat also reviewed with Stephanie Bautista and her husband the chemotherapy, and its role, even if she may not feel a change in her breasts.    Stephanie Bautista will return in two weeks for labs, f/u, and her next chemotherapy.  She knows to call for any other issues that may develop before the next visit.    , NP  11/09/17 8:39 AM Medical Oncology and Hematology [facility] [address] [place], [place] [address] Tel. [phone]    Fax. [phone]    ADDENDUM: Stephanie Bautista is appropriately concerned because after completing the more intense portion of her chemotherapy she still has a palpable mass in the breast.  We discussed the fact that the real purpose of chemotherapy is to sterilize peripheral disease.  If the real purpose was simply to get rid of the mass in the breast we would simply do a lumpectomy or mastectomy.  While the response in the breast does give information regarding the peripheral response, a lack of complete response in the  breast does not mean that she is not getting any peripheral disease sterilized, which is the reason she is getting the chemo.  Also we gave her the choice of being treated weekly with 3 weekly doses of Granix (lots of visits) versus being treated 2 weeks on and one week off with a single Neulasta dose on day 9 (adds several   weeks to the treatment schedule).  She clearly opted for the latter and that is what we are operational lysing.  I personally saw this patient and performed a substantive portion of this encounter with the listed APP documented above.   Chauncey Cruel, MD Medical Oncology and Hematology [facility] [address] [place], [place] [address] Tel. [phone]    Fax. [phone]

## 2017-11-09 NOTE — Progress Notes (Signed)
Per Wilber Bihari, NP okay to treat pt with anc of 1.1

## 2017-11-09 NOTE — Patient Instructions (Signed)
   [facility] Discharge Instructions for Patients Receiving Chemotherapy  Today you received the following chemotherapy agents Taxol and Carboplatin   To help prevent nausea and vomiting after your treatment, we encourage you to take your nausea medication as directed.    If you develop nausea and vomiting that is not controlled by your nausea medication, call the clinic.   BELOW ARE SYMPTOMS THAT SHOULD BE REPORTED IMMEDIATELY:  *FEVER GREATER THAN 100.5 F  *CHILLS WITH OR WITHOUT FEVER  NAUSEA AND VOMITING THAT IS NOT CONTROLLED WITH YOUR NAUSEA MEDICATION  *UNUSUAL SHORTNESS OF BREATH  *UNUSUAL BRUISING OR BLEEDING  TENDERNESS IN MOUTH AND THROAT WITH OR WITHOUT PRESENCE OF ULCERS  *URINARY PROBLEMS  *BOWEL PROBLEMS  UNUSUAL RASH Items with * indicate a potential emergency and should be followed up as soon as possible.  Feel free to call the clinic should you have any questions or concerns. The clinic phone number is [phone].  Please show the CHEMO ALERT CARD at check-in to the Emergency Department and triage nurse.   

## 2017-11-09 NOTE — Patient Instructions (Signed)

## 2017-11-10 MED ORDER — DEXAMETHASONE SODIUM PHOSPHATE 10 MG/ML IJ SOLN
INTRAMUSCULAR | Status: AC
  Filled 2017-11-10: qty 1

## 2017-11-10 NOTE — Addendum Note (Signed)
Addended by: Neysa Hotter on: 11/10/2017 03:15 PM   Modules accepted: Orders

## 2017-11-16 ENCOUNTER — Ambulatory Visit: Payer: Medicare Other

## 2017-11-16 ENCOUNTER — Other Ambulatory Visit: Payer: Medicare Other

## 2017-11-17 ENCOUNTER — Other Ambulatory Visit: Payer: Self-pay | Admitting: Adult Health

## 2017-11-23 ENCOUNTER — Other Ambulatory Visit: Payer: Medicare Other

## 2017-11-23 ENCOUNTER — Encounter: Payer: Self-pay | Admitting: *Deleted

## 2017-11-23 ENCOUNTER — Telehealth: Payer: Self-pay | Admitting: Adult Health

## 2017-11-23 ENCOUNTER — Inpatient Hospital Stay: Payer: Medicare Other | Attending: Oncology

## 2017-11-23 ENCOUNTER — Inpatient Hospital Stay: Payer: Medicare Other

## 2017-11-23 ENCOUNTER — Encounter: Payer: Self-pay | Admitting: Adult Health

## 2017-11-23 ENCOUNTER — Inpatient Hospital Stay (HOSPITAL_BASED_OUTPATIENT_CLINIC_OR_DEPARTMENT_OTHER): Payer: Medicare Other | Admitting: Adult Health

## 2017-11-23 VITALS — BP 140/73 | HR 76 | Temp 98.6°F | Resp 18 | Ht 63.0 in | Wt 132.9 lb

## 2017-11-23 DIAGNOSIS — R682 Dry mouth, unspecified: Secondary | ICD-10-CM

## 2017-11-23 DIAGNOSIS — C50412 Malignant neoplasm of upper-outer quadrant of left female breast: Secondary | ICD-10-CM

## 2017-11-23 DIAGNOSIS — H66009 Acute suppurative otitis media without spontaneous rupture of ear drum, unspecified ear: Secondary | ICD-10-CM | POA: Insufficient documentation

## 2017-11-23 DIAGNOSIS — Z5111 Encounter for antineoplastic chemotherapy: Secondary | ICD-10-CM | POA: Diagnosis not present

## 2017-11-23 DIAGNOSIS — Z5189 Encounter for other specified aftercare: Secondary | ICD-10-CM | POA: Diagnosis not present

## 2017-11-23 DIAGNOSIS — Z171 Estrogen receptor negative status [ER-]: Secondary | ICD-10-CM

## 2017-11-23 DIAGNOSIS — Z86000 Personal history of in-situ neoplasm of breast: Secondary | ICD-10-CM | POA: Diagnosis not present

## 2017-11-23 DIAGNOSIS — M810 Age-related osteoporosis without current pathological fracture: Secondary | ICD-10-CM | POA: Insufficient documentation

## 2017-11-23 DIAGNOSIS — D0511 Intraductal carcinoma in situ of right breast: Secondary | ICD-10-CM

## 2017-11-23 DIAGNOSIS — Z95828 Presence of other vascular implants and grafts: Secondary | ICD-10-CM

## 2017-11-23 LAB — COMPREHENSIVE METABOLIC PANEL
ALBUMIN: 3.9 g/dL (ref 3.5–5.0)
ALT: 18 U/L (ref 0–44)
AST: 24 U/L (ref 15–41)
Alkaline Phosphatase: 78 U/L (ref 38–126)
Anion gap: 6 (ref 5–15)
BUN: 11 mg/dL (ref 8–23)
CHLORIDE: 107 mmol/L (ref 98–111)
CO2: 28 mmol/L (ref 22–32)
CREATININE: 0.66 mg/dL (ref 0.44–1.00)
Calcium: 9.1 mg/dL (ref 8.9–10.3)
GFR calc Af Amer: >60 mL/min (ref >60)
GFR calc non Af Amer: >60 mL/min (ref >60)
GLUCOSE: 98 mg/dL (ref 70–99)
POTASSIUM: 3.3 mmol/L — AB (ref 3.5–5.1)
Sodium: 141 mmol/L (ref 135–145)
Total Bilirubin: 0.5 mg/dL (ref 0.3–1.2)
Total Protein: 6 g/dL — ABNORMAL LOW (ref 6.5–8.1)

## 2017-11-23 LAB — CBC WITH DIFFERENTIAL/PLATELET
BASOS PCT: 1 %
Basophils Absolute: 0 10*3/uL (ref 0.0–0.1)
Eosinophils Absolute: 0 10*3/uL (ref 0.0–0.5)
Eosinophils Relative: 1 %
HCT: 29.4 % — ABNORMAL LOW (ref 34.8–46.6)
Hemoglobin: 9.4 g/dL — ABNORMAL LOW (ref 11.6–15.9)
Lymphocytes Relative: 21 %
Lymphs Abs: 0.7 10*3/uL — ABNORMAL LOW (ref 0.9–3.3)
MCH: 30.5 pg (ref 25.1–34.0)
MCHC: 32 g/dL (ref 31.5–36.0)
MCV: 95.5 fL (ref 79.5–101.0)
MONO ABS: 0.4 10*3/uL (ref 0.1–0.9)
MONOS PCT: 12 %
NEUTROS ABS: 2 10*3/uL (ref 1.5–6.5)
Neutrophils Relative %: 65 %
Platelets: 139 10*3/uL — ABNORMAL LOW (ref 145–400)
RBC: 3.08 MIL/uL — ABNORMAL LOW (ref 3.70–5.45)
RDW: 16.1 % — AB (ref 11.2–14.5)
WBC: 3.2 10*3/uL — ABNORMAL LOW (ref 3.9–10.3)

## 2017-11-23 MED ORDER — FAMOTIDINE IN NACL 20-0.9 MG/50ML-% IV SOLN
INTRAVENOUS | Status: AC
  Filled 2017-11-23: qty 50

## 2017-11-23 MED ORDER — DIPHENHYDRAMINE HCL 50 MG/ML IJ SOLN
INTRAMUSCULAR | Status: AC
  Filled 2017-11-23: qty 1

## 2017-11-23 MED ORDER — PROMETHAZINE HCL 25 MG/ML IJ SOLN
12.5000 mg | Freq: Once | INTRAMUSCULAR | Status: AC
  Administered 2017-11-23: 12.5 mg via INTRAVENOUS

## 2017-11-23 MED ORDER — HEPARIN SOD (PORK) LOCK FLUSH 100 UNIT/ML IV SOLN
500.0000 [IU] | Freq: Once | INTRAVENOUS | Status: AC | PRN
  Administered 2017-11-23: 500 [IU]
  Filled 2017-11-23: qty 5

## 2017-11-23 MED ORDER — PACLITAXEL CHEMO INJECTION 300 MG/50ML
80.0000 mg/m2 | Freq: Once | INTRAVENOUS | Status: AC
  Administered 2017-11-23: 138 mg via INTRAVENOUS
  Filled 2017-11-23: qty 23

## 2017-11-23 MED ORDER — SODIUM CHLORIDE 0.9% FLUSH
10.0000 mL | INTRAVENOUS | Status: DC | PRN
  Administered 2017-11-23: 10 mL
  Filled 2017-11-23: qty 10

## 2017-11-23 MED ORDER — DIPHENHYDRAMINE HCL 50 MG/ML IJ SOLN
25.0000 mg | Freq: Once | INTRAMUSCULAR | Status: AC
  Administered 2017-11-23: 25 mg via INTRAVENOUS

## 2017-11-23 MED ORDER — FAMOTIDINE IN NACL 20-0.9 MG/50ML-% IV SOLN
20.0000 mg | Freq: Once | INTRAVENOUS | Status: AC
  Administered 2017-11-23: 20 mg via INTRAVENOUS

## 2017-11-23 MED ORDER — SODIUM CHLORIDE 0.9 % IV SOLN
162.0000 mg | Freq: Once | INTRAVENOUS | Status: AC
  Administered 2017-11-23: 160 mg via INTRAVENOUS
  Filled 2017-11-23: qty 16

## 2017-11-23 MED ORDER — PROMETHAZINE HCL 25 MG/ML IJ SOLN
INTRAMUSCULAR | Status: AC
  Filled 2017-11-23: qty 1

## 2017-11-23 MED ORDER — SODIUM CHLORIDE 0.9 % IV SOLN
Freq: Once | INTRAVENOUS | Status: AC
  Administered 2017-11-23: 10:00:00 via INTRAVENOUS
  Filled 2017-11-23: qty 5

## 2017-11-23 MED ORDER — SODIUM CHLORIDE 0.9% FLUSH
10.0000 mL | Freq: Once | INTRAVENOUS | Status: AC
  Administered 2017-11-23: 10 mL
  Filled 2017-11-23: qty 10

## 2017-11-23 MED ORDER — SODIUM CHLORIDE 0.9 % IV SOLN
Freq: Once | INTRAVENOUS | Status: AC
  Administered 2017-11-23: 10:00:00 via INTRAVENOUS

## 2017-11-23 NOTE — Progress Notes (Signed)
Stephanie Bautista  [phone] [phone]     ID: Melondy B Gist DOB: March 31, 1951  MR#: [id number]  YTK#:[id number]  Patient Care Team: Haywood Pao, MD as PCP - General (Internal Medicine) Magrinat, Virgie Dad, MD as Consulting Physician (Oncology) Rolm Bookbinder, MD as Consulting Physician (General Surgery) Maisie Fus, MD as Consulting Physician (Obstetrics and Gynecology) Delrae Rend, MD as Consulting Physician (Endocrinology) Verner Chol, MD as Consulting Physician (Sports Medicine) Lavonia Dana, MD as Referring Physician (Otolaryngology) OTHER MD:  CHIEF COMPLAINT: Triple negative breast cancer  CURRENT TREATMENT: Neoadjuvant chemotherapy  HISTORY OF CURRENT ILLNESS: From the original intake note:  I saw [name] remotely for her history of ductal carcinoma in situ of the right breast, treated with lumpectomy 09/02/1994, with the pathology showing ([id number]) ductal carcinoma in situ.  There was no invasive component.  This was followed by radiation given between 09/21/94 and 11/04/94, with a [place] 75170 centigray to the right breast and a total of 604 0 cGy to the tumor bed.  She notes that she didn't take any anti-estrogen therapy at that time.  More recently, Stephanie Bautista had routine screening mammography at [facility] on 07/16/2017 showing a possible abnormality in the left breast.  The breast density was category B.  She underwent left breast ultrasound on 07/16/2017 showing: a 1.5 cm irregular mass in the left breast upper outer quadrant posterior depth is consistent with carcinoma. A 6 mm irregular mass in the left breast upper outer quadrant posterior depth was also highly suggestive for malignancy. A lymph node in the left axillary tail was read as suspicious of malignancy.   Accordingly on 07/22/2017 she proceeded to biopsy of the left breast area in question as well as the suspicious left axillary lymph node. The pathology from this  procedure showed ([id number]): Lymph node, needle/core biopsy, left axilla with no evidence of carcinoma-- concordant. Breast, left, needle core biopsy, invasive ductal carcinoma, grade 3, prognostic indicators significant for: ER, 0% negative and PR, 0% negative. Proliferation marker Ki67 at 50%. HER2 negative signals ratio is 1.36, and the number per cell 1.70 .Breast, left, needle core biopsy, architectural distortion consistent with fibroadenoma--discordant.  She has a bilateral breast MRI scheduled for 07/28/2017.  The patient's subsequent history is as detailed below.  INTERVAL HISTORY: Stephanie Bautista returns today for a follow-up and treatment of her triple negative breast cancer accompanied by her husband.  She has completed neoadjuvant treatment with Doxorubicin and Cyclophosphamide.  She is now receiving neo adjuvant Paclitaxel and Carboplatin, day 1 and 8 on a 21 day cycle with onpro support for day 9 (due to neutropenia).  Today is cycle 2 day 1 of treatment.      REVIEW OF SYSTEMS: [name] is doing well today.  She has noted some skin changes since starting chemotherapy with an increase in her moles on her skin.  She noted some blood streaked stool last weekend x 2 days that has since resolved.  She has a longstanding h/o constipation and straining with her stool.  She also notes since starting chemotherapy that she will have loose stool one day, and constipation the next.  [name] continues to experience dry mouth.  She uses the Nystatin PRN.   At her last appointment she notes that her premedications were just the right amount.    Lavoris is doing well otherwise.  She denies fevers, chills, nausea, vomiting, headaches, shortness of breath, cough.  She has previously had a mild intermittent numbness in her  fingertips that happened for a few days following chemotherapy.  It has currently resolved.     PAST MEDICAL HISTORY: Past Medical History:  Diagnosis Date  . Acoustic neuroma ([place])   .  Adenomatous colon polyp 12/2002  . Arthritis   . Breast cancer ([place]) 1996   right breast cancer in 1996, left breast cancer in 2019  . Cholelithiases   . Esophageal stricture   . Family history of breast cancer   . Family history of ovarian cancer   . Family history of pancreatic cancer   . Family history of prostate cancer   . GERD (gastroesophageal reflux disease)   . Hemorrhoids   . Hiatal hernia   . History of colon polyps   . Inguinal hernia   . UTI (lower urinary tract infection)   As far as PMHx she has had intermittent back pain (spinal stenosis) x 2 years,  Osteoporosis, random migraines, early cataracts and early hear loss due to schwannoma.   PAST SURGICAL HISTORY: Past Surgical History:  Procedure Laterality Date  . BREAST LUMPECTOMY    . CESAREAN SECTION     2x  . CHOLECYSTECTOMY  2008  . HEMORRHOID SURGERY  2007  . INGUINAL HERNIA REPAIR  1968  . PARTIAL HYSTERECTOMY  1996  . PORTACATH PLACEMENT Right 08/05/2017   Procedure: INSERTION PORT-A-CATH WITH [place];  Surgeon: Rolm Bookbinder, MD;  Location: [facility];  Service: General;  Laterality: Right;  . TONSILLECTOMY      FAMILY HISTORY Family History  Problem Relation Age of Onset  . Heart disease Mother   . Melanoma Mother        dx in her 48s  . Ovarian cancer Mother 3  . Heart disease Father   . Diabetes Father   . Stroke Father   . Ovarian cancer Sister 60  . Pancreatic cancer Maternal Aunt   . Bone cancer Maternal Uncle   . Breast cancer Paternal Aunt   . Non-Hodgkin's lymphoma Maternal Grandmother   . Heart disease Maternal Grandfather   . Heart disease Paternal Grandmother   . Prostate cancer Paternal Grandfather   . Stroke Maternal Uncle   . Bone cancer Maternal Uncle   . Cancer Maternal Aunt        NOS  . Breast cancer Cousin        daughter of mat aunt with NOS cancer  Her mother died from CHF at age 40. Her father died from cardiac issues at age 43. She doesn't have any  brothers, but has 3 sisters. She had one sister and her mother with ovarian cancer. Her sister was age 32 when she was diagnosed with ovarian cancer and her mother was in her late 41's when she was diagnosed with ovarian cancer. Her sister was not tested genetically following this. Her maternal grandmother had non-hodgkins lymphoma. Her paternal grandfather had prostate cancer. Her paternal aunt had breast cancer. Her paternal aunt's daughter had breast cancer. She had several uncles that had prostate cancer that spread to the bones.   GYNECOLOGIC HISTORY:  No LMP recorded. Patient has had a hysterectomy. Menarche: 67 years old Age at first live birth: 66 years old [place] P2 LMP: hysterectomy at age 59 S/p Hysterectomy with USO (L)  SOCIAL HISTORY: She has been retired from Medical sales representative work since 05/26/2017. Her husband, Kasandra Knudsen has been retired from the [employer] for the past 9 years. Their son is Leroy Sea is 62 and a IT trainer. Their daughter, Adonis Brook  lives in [facility], is 49 and works in child care at [employer]. The patient has 6 grandchildren ( 2 boys and 4 girls). She is a Psychologist, forensic.      ADVANCED DIRECTIVES:    HEALTH MAINTENANCE: Social History   Tobacco Use  . Smoking status: Never Smoker  . Smokeless tobacco: Never Used  Substance Use Topics  . Alcohol use: No  . Drug use: No     Colonoscopy: Dr. Fuller Plan.   PAP: Dr Nori Riis  Bone density: at Dr. Verlon Au office/ osteoporosis.    Allergies  Allergen Reactions  . Amoxicillin Itching and Rash    Current Outpatient Medications  Medication Sig Dispense Refill  . acetaminophen (TYLENOL) 500 MG tablet Take 500 mg by mouth every 6 (six) hours as needed.    Marland Kitchen alum & mag hydroxide-simeth (MAALOX/MYLANTA) 200-200-20 MG/5ML suspension Take by mouth every 6 (six) hours as needed for indigestion or heartburn.    . baclofen (LIORESAL) 10 MG tablet Take 10 mg by mouth as needed for muscle spasms.    . ciprofloxacin (CIPRO)  500 MG tablet Take one tablet twice a day for 5 days as directed 30 tablet 1  . clotrimazole (MYCELEX) 10 MG troche Take 1 lozenge (10 mg total) by mouth 4 (four) times daily. 56 lozenge 0  . dexamethasone (DECADRON) 4 MG tablet Take 2 tablets by mouth once a day on the day after chemotherapy and then take 2 tablets two times a day for 2 days. Take with food. 30 tablet 1  . lidocaine-prilocaine (EMLA) cream Apply to affected area once 30 g 3  . LORazepam (ATIVAN) 0.5 MG tablet Take 1 tablet (0.5 mg total) by mouth at bedtime as needed (Nausea or vomiting). 30 tablet 0  . nystatin (MYCOSTATIN) 100000 UNIT/ML suspension Take 5 mLs (500,000 Units total) by mouth 4 (four) times daily. 240 mL 1  . omeprazole (PRILOSEC) 40 MG capsule Take 1 capsule (40 mg total) by mouth 2 (two) times daily. 60 capsule 11  . predniSONE (DELTASONE) 5 MG tablet 6 tab x 1 day, 5 tab x 1 day, 4 tab x 1 day, 3 tab x 1 day, 2 tab x 1 day, 1 tab x 1 day, stop 21 tablet 0  . prochlorperazine (COMPAZINE) 10 MG tablet Take 1 tablet (10 mg total) by mouth every 6 (six) hours as needed (Nausea or vomiting). 30 tablet 1  . promethazine (PHENERGAN) 12.5 MG tablet 1-2 p.o. every 6 hours as needed for nausea or headache 45 tablet 1  . traMADol (ULTRAM) 50 MG tablet 1-2 tablets every 6 hours as needed for pain 30 tablet 0   No current facility-administered medications for this visit.     OBJECTIVE:  Vitals:   11/23/17 0826  BP: 140/73  Pulse: 76  Resp: 18  Temp: 98.6 F (37 C)  SpO2: 100%     Body mass index is 23.54 kg/m.   Wt Readings from Last 3 Encounters:  11/23/17 132 lb 14.4 oz (60.3 kg)  11/09/17 133 lb (60.3 kg)  11/02/17 134 lb 1.6 oz (60.8 kg)  ECOG FS:1 - Symptomatic but completely ambulatory GENERAL: Patient is a well appearing female in no acute distress HEENT:  Sclerae anicteric.  Oropharynx clear and moist. No ulcerations or evidence of oropharyngeal candidiasis. Neck is supple.  NODES:  No cervical,  supraclavicular, or axillary lymphadenopathy palpated.  BREAST EXAM:  Small, soft approx 1.5cm nodule in left upper outer breast LUNGS:  Clear to auscultation bilaterally.  No wheezes or rhonchi. HEART:  Regular rate and rhythm. No murmur appreciated. ABDOMEN:  Soft, nontender.  Positive, normoactive bowel sounds. No organomegaly palpated. MSK:  No focal spinal tenderness to palpation. Full range of motion bilaterally in the upper extremities. EXTREMITIES:  No peripheral edema.   SKIN:  Clear with no obvious rashes or skin changes. No nail dyscrasia. NEURO:  Nonfocal. Well oriented.  Appropriate affect.    LAB RESULTS:  CMP     Component Value Date/Time   NA 142 11/09/2017 0830   K 3.6 11/09/2017 0830   CL 109 11/09/2017 0830   CO2 24 11/09/2017 0830   GLUCOSE 91 11/09/2017 0830   BUN 8 11/09/2017 0830   CREATININE 0.65 11/09/2017 0830   CREATININE 0.81 07/27/2017 1007   CREATININE 0.69 11/16/2011 1054   CALCIUM 9.1 11/09/2017 0830   PROT 6.1 (L) 11/09/2017 0830   ALBUMIN 3.8 11/09/2017 0830   AST 17 11/09/2017 0830   AST 16 07/27/2017 1007   ALT 14 11/09/2017 0830   ALT 12 07/27/2017 1007   ALKPHOS 57 11/09/2017 0830   BILITOT 0.3 11/09/2017 0830   BILITOT 0.6 07/27/2017 1007   GFRNONAA >60 11/09/2017 0830   GFRNONAA >60 07/27/2017 1007   GFRAA >60 11/09/2017 0830   GFRAA >60 07/27/2017 1007    No results found for: TOTALPROTELP, ALBUMINELP, A1GS, A2GS, BETS, BETA2SER, GAMS, MSPIKE, SPEI  No results found for: KPAFRELGTCHN, LAMBDASER, KAPLAMBRATIO  Lab Results  Component Value Date   WBC 1.9 (L) 11/09/2017   NEUTROABS 1.1 (L) 11/09/2017   HGB 9.7 (L) 11/09/2017   HCT 28.8 (L) 11/09/2017   MCV 90.8 11/09/2017   PLT 191 11/09/2017    '@LASTCHEMISTRY'$ @  No results found for: LABCA2  No components found for: PPJKDT267  No results for input(s): INR in the last 168 hours.  No results found for: LABCA2  No results found for: TIW580  No results found for:  DXI338  No results found for: SNK539  No results found for: CA2729  No components found for: HGQUANT  No results found for: CEA1 / No results found for: CEA1   No results found for: AFPTUMOR  No results found for: CHROMOGRNA  No results found for: PSA1  No visits with results within 3 Day(s) from this visit.  Latest known visit with results is:  Appointment on 11/09/2017  Component Date Value Ref Range Status  . Sodium 11/09/2017 142  136 - 145 mmol/L Final  . Potassium 11/09/2017 3.6  3.5 - 5.1 mmol/L Final  . Chloride 11/09/2017 109  98 - 109 mmol/L Final  . CO2 11/09/2017 24  22 - 29 mmol/L Final  . Glucose, Bld 11/09/2017 91  70 - 140 mg/dL Final  . BUN 11/09/2017 8  7 - 26 mg/dL Final  . Creatinine, Ser 11/09/2017 0.65  0.60 - 1.10 mg/dL Final  . Calcium 11/09/2017 9.1  8.4 - 10.4 mg/dL Final  . Total Protein 11/09/2017 6.1* 6.4 - 8.3 g/dL Final  . Albumin 11/09/2017 3.8  3.5 - 5.0 g/dL Final  . AST 11/09/2017 17  5 - 34 U/L Final  . ALT 11/09/2017 14  0 - 55 U/L Final  . Alkaline Phosphatase 11/09/2017 57  40 - 150 U/L Final  . Total Bilirubin 11/09/2017 0.3  0.2 - 1.2 mg/dL Final  . GFR calc non Af Amer 11/09/2017 >60  >60 mL/min Final  . GFR calc Af Amer 11/09/2017 >60  >60 mL/min Final   Comment: (NOTE) The  eGFR has been calculated using the CKD EPI equation. This calculation has not been validated in all clinical situations. eGFR's persistently <60 mL/min signify possible Chronic Kidney Disease.   Georgiann Hahn gap 11/09/2017 9  3 - 11 Final   Performed at [facility], [place] [address] [place], [place] [address]  . WBC 11/09/2017 1.9* 3.9 - 10.3 K/uL Final  . RBC 11/09/2017 3.17* 3.70 - 5.45 MIL/uL Final  . Hemoglobin 11/09/2017 9.7* 11.6 - 15.9 g/dL Final  . HCT 11/09/2017 28.8* 34.8 - 46.6 % Final  . MCV 11/09/2017 90.8  79.5 - 101.0 fL Final  . MCH 11/09/2017 30.7  25.1 - 34.0 pg Final  . MCHC 11/09/2017 33.8  31.5 - 36.0 g/dL Final    . RDW 11/09/2017 17.1* 11.2 - 14.5 % Final  . Platelets 11/09/2017 191  145 - 400 K/uL Final  . Neutrophils Relative % 11/09/2017 60  % Final  . Neutro Abs 11/09/2017 1.1* 1.5 - 6.5 K/uL Final  . Lymphocytes Relative 11/09/2017 20  % Final  . Lymphs Abs 11/09/2017 0.4* 0.9 - 3.3 K/uL Final  . Monocytes Relative 11/09/2017 16  % Final  . Monocytes Absolute 11/09/2017 0.3  0.1 - 0.9 K/uL Final  . Eosinophils Relative 11/09/2017 1  % Final  . Eosinophils Absolute 11/09/2017 0.0  0.0 - 0.5 K/uL Final  . Basophils Relative 11/09/2017 3  % Final  . Basophils Absolute 11/09/2017 0.1  0.0 - 0.1 K/uL Final   Performed at [facility], [address] [place], [place] [address]    (this displays the last labs from the last 3 days)  No results found for: TOTALPROTELP, ALBUMINELP, A1GS, A2GS, BETS, BETA2SER, GAMS, MSPIKE, SPEI (this displays SPEP labs)  No results found for: KPAFRELGTCHN, LAMBDASER, KAPLAMBRATIO (kappa/lambda light chains)  No results found for: HGBA, HGBA2QUANT, HGBFQUANT, HGBSQUAN (Hemoglobinopathy evaluation)   No results found for: LDH  No results found for: IRON, TIBC, IRONPCTSAT (Iron and TIBC)  No results found for: FERRITIN  Urinalysis    Component Value Date/Time   BILIRUBINUR neg 04/06/2014 1019   PROTEINUR neg 04/06/2014 1019   UROBILINOGEN 0.2 04/06/2014 1019   NITRITE neg 04/06/2014 1019   LEUKOCYTESUR Negative 04/06/2014 1019     STUDIES: No results found.   ELIGIBLE FOR AVAILABLE RESEARCH PROTOCOL: UPBEAT  ASSESSMENT: 67 y.o. [place] woman  (1) status post right lumpectomy 09/02/1994 for ductal carcinoma in situ  (a) status post adjuvant radiation 09/21/1994 through 11/04/1994, total 5040 centigrade to the breast and 604 0 cGy to the tumor bed  (b) did not receive adjuvant antiestrogens  (2) status post left breast upper outer quadrant biopsy 07/22/2017 for a clinical T1c pN0, stage IB invasive ductal carcinoma,  grade 3, triple negative, with an MIB-1 of 50%.  (a) lymph node biopsy on the same day was negative for malignancy (concordant)  (b) a second area of architectural distortion was also biopsied, read as fibroadenoma (discordant)  (c) a third area of architectural distortion has not yet been biopsied  (d) breast MRI scheduled for 07/28/2017 shows a clinical T2 N0 tumor  (3) genetics testing 08/02/2017 through the Multi-Gene Panel offered by [facility] found no deleterious mutations in ALK, APC, ATM, AXIN2,BAP1,  BARD1, BLM, BMPR1A, BRCA1, BRCA2, BRIP1, CASR, CDC73, CDH1, CDK4, CDKN1B, CDKN1C, CDKN2A (p14ARF), CDKN2A (p16INK4a), CEBPA, CHEK2, CTNNA1, DICER1, DIS3L2, EGFR (c.2369C>T, p.Thr790Met variant only), EPCAM (Deletion/duplication testing only), FH, FLCN, GATA2, GPC3, GREM1 (Promoter region deletion/duplication testing only), HOXB13 (  c.251G>A, p.Gly84Glu), HRAS, KIT, MAX, MEN1, MET, MITF (c.952G>A, p.Glu318Lys variant only), MLH1, MSH2, MSH3, MSH6, MUTYH, NBN, NF1, NF2, NTHL1, PALB2, PDGFRA, PHOX2B, PMS2, POLD1, POLE, POT1, PRKAR1A, PTCH1, PTEN, RAD50, RAD51C, RAD51D, RB1, RECQL4, RET, RUNX1, SDHAF2, SDHA (sequence changes only), SDHB, SDHC, SDHD, SMAD4, SMARCA4, SMARCB1, SMARCE1, STK11, SUFU, TERT, TERT, TMEM127, TP53, TSC1, TSC2, VHL, WRN and WT1.  The report date is August 02, 2017.  (a) WT1 c.332C>T VUS identified on the Multi-cancer gene panel.    (4) chemotherapy will consist of doxorubicin/ cyclophosphamide in dose dense fashion x4 starting 08/24/2017 followed by paclitaxel/ carboplatin weekly x12  (5) definitive surgery to follow  (6) adjuvant radiation to follow as appropriate  PLAN: Dulcemaria is doing well today.  She will receive chemotherapy with Paclitaxel and Carboplatin.  Her WBC though ok for treatment, did not respond as hoped with the Neulasta.  Due to this, she will return on 7/4 and 7/5 for Granix.  She was disappointed with this.  We did discuss this in detail for a good portion of  today's visit.    We will monitor her stool issue.  Since it is currently resolved and she alternates between constipation and loose stools we will keep an eye on it.  She may need to add a stool softener.    Shalon will return next week for labs, f/u, and her next chemotherapy.  She will receive Onpro during this time.  She knows to call for any questions or concerns in the interim.     A total of (30) minutes of face-to-face time was spent with this patient with greater than 50% of that time in counseling and care-coordination.   Wilber Bihari, NP  11/23/17 8:39 AM Medical Oncology and Hematology [facility] [address] [place], [place] [address] Tel. [phone]    Fax. [phone]

## 2017-11-23 NOTE — Patient Instructions (Signed)
[place] Discharge Instructions for Patients Receiving Chemotherapy  Today you received the following chemotherapy agents taxol/carboplatin   To help prevent nausea and vomiting after your treatment, we encourage you to take your nausea medication as directed  If you develop nausea and vomiting that is not controlled by your nausea medication, call the clinic.   BELOW ARE SYMPTOMS THAT SHOULD BE REPORTED IMMEDIATELY:  *FEVER GREATER THAN 100.5 F  *CHILLS WITH OR WITHOUT FEVER  NAUSEA AND VOMITING THAT IS NOT CONTROLLED WITH YOUR NAUSEA MEDICATION  *UNUSUAL SHORTNESS OF BREATH  *UNUSUAL BRUISING OR BLEEDING  TENDERNESS IN MOUTH AND THROAT WITH OR WITHOUT PRESENCE OF ULCERS  *URINARY PROBLEMS  *BOWEL PROBLEMS  UNUSUAL RASH Items with * indicate a potential emergency and should be followed up as soon as possible.  Feel free to call the clinic you have any questions or concerns. The clinic phone number is [phone].   Hypokalemia Hypokalemia means that the amount of potassium in the blood is lower than normal.Potassium is a chemical that helps regulate the amount of fluid in the body (electrolyte). It also stimulates muscle tightening (contraction) and helps nerves work properly.Normally, most of the body's potassium is inside of cells, and only a very small amount is in the blood. Because the amount in the blood is so small, minor changes to potassium levels in the blood can be life-threatening. What are the causes? This condition may be caused by:  Antibiotic medicine.  Diarrhea or vomiting. Taking too much of a medicine that helps you have a bowel movement (laxative) can cause diarrhea and lead to hypokalemia.  Chronic kidney disease (CKD).  Medicines that help the body get rid of excess fluid (diuretics).  Eating disorders, such as bulimia.  Low magnesium levels in the body.  Sweating a lot.  What are the signs or symptoms? Symptoms of this  condition include:  Weakness.  Constipation.  Fatigue.  Muscle cramps.  Mental confusion.  Skipped heartbeats or irregular heartbeat (palpitations).  Tingling or numbness.  How is this diagnosed? This condition is diagnosed with a blood test. How is this treated? Hypokalemia can be treated by taking potassium supplements by mouth or adjusting the medicines that you take. Treatment may also include eating more foods that contain a lot of potassium. If your potassium level is very low, you may need to get potassium through an IV tube in one of your veins and be monitored in the hospital. Follow these instructions at home:  Take over-the-counter and prescription medicines only as told by your health care provider. This includes vitamins and supplements.  Eat a healthy diet. A healthy diet includes fresh fruits and vegetables, whole grains, healthy fats, and lean proteins.  If instructed, eat more foods that contain a lot of potassium, such as: ? Nuts, such as peanuts and pistachios. ? Seeds, such as sunflower seeds and pumpkin seeds. ? Peas, lentils, and lima beans. ? Whole grain and bran cereals and breads. ? Fresh fruits and vegetables, such as apricots, avocado, bananas, cantaloupe, kiwi, oranges, tomatoes, asparagus, and potatoes. ? Orange juice. ? Tomato juice. ? Red meats. ? Yogurt.  Keep all follow-up visits as told by your health care provider. This is important. Contact a health care provider if:  You have weakness that gets worse.  You feel your heart pounding or racing.  You vomit.  You have diarrhea.  You have diabetes (diabetes mellitus) and you have trouble keeping your blood sugar (glucose) in your target  range. Get help right away if:  You have chest pain.  You have shortness of breath.  You have vomiting or diarrhea that lasts for more than 2 days.  You faint. This information is not intended to replace advice given to you by your health care  provider. Make sure you discuss any questions you have with your health care provider. Document Released: 05/11/2005 Document Revised: 12/28/2015 Document Reviewed: 12/28/2015 Elsevier Interactive Patient Education  2018 [employer].

## 2017-11-23 NOTE — Telephone Encounter (Signed)
Gave patient avs and calendar of upcoming July appointments.  °

## 2017-11-23 NOTE — Patient Instructions (Signed)

## 2017-11-24 ENCOUNTER — Other Ambulatory Visit: Payer: Self-pay | Admitting: Oncology

## 2017-11-25 ENCOUNTER — Inpatient Hospital Stay: Payer: Medicare Other

## 2017-11-25 VITALS — BP 127/57 | HR 75 | Temp 98.6°F | Resp 17

## 2017-11-25 DIAGNOSIS — Z5189 Encounter for other specified aftercare: Secondary | ICD-10-CM | POA: Diagnosis not present

## 2017-11-25 DIAGNOSIS — C50412 Malignant neoplasm of upper-outer quadrant of left female breast: Secondary | ICD-10-CM

## 2017-11-25 DIAGNOSIS — Z5111 Encounter for antineoplastic chemotherapy: Secondary | ICD-10-CM | POA: Diagnosis not present

## 2017-11-25 DIAGNOSIS — Z171 Estrogen receptor negative status [ER-]: Secondary | ICD-10-CM

## 2017-11-25 DIAGNOSIS — H66009 Acute suppurative otitis media without spontaneous rupture of ear drum, unspecified ear: Secondary | ICD-10-CM | POA: Diagnosis not present

## 2017-11-25 DIAGNOSIS — Z95828 Presence of other vascular implants and grafts: Secondary | ICD-10-CM

## 2017-11-25 DIAGNOSIS — M810 Age-related osteoporosis without current pathological fracture: Secondary | ICD-10-CM | POA: Diagnosis not present

## 2017-11-25 DIAGNOSIS — Z86000 Personal history of in-situ neoplasm of breast: Secondary | ICD-10-CM | POA: Diagnosis not present

## 2017-11-25 MED ORDER — TBO-FILGRASTIM 480 MCG/0.8ML ~~LOC~~ SOSY
480.0000 ug | PREFILLED_SYRINGE | Freq: Once | SUBCUTANEOUS | Status: AC
  Administered 2017-11-25: 480 ug via SUBCUTANEOUS

## 2017-11-25 MED ORDER — TBO-FILGRASTIM 480 MCG/0.8ML ~~LOC~~ SOSY
PREFILLED_SYRINGE | SUBCUTANEOUS | Status: AC
  Filled 2017-11-25: qty 0.8

## 2017-11-25 NOTE — Patient Instructions (Signed)
Tbo-Filgrastim injection What is this medicine? TBO-FILGRASTIM (T B O fil GRA stim) is a granulocyte colony-stimulating factor that stimulates the growth of neutrophils, a type of white blood cell important in the body's fight against infection. It is used to reduce the incidence of fever and infection in patients with certain types of cancer who are receiving chemotherapy that affects the bone marrow. This medicine may be used for other purposes; ask your health care provider or pharmacist if you have questions. COMMON BRAND NAME(S): Granix What should I tell my health care provider before I take this medicine? They need to know if you have any of these conditions: -bone scan or tests planned -kidney disease -sickle cell anemia -an unusual or allergic reaction to tbo-filgrastim, filgrastim, pegfilgrastim, other medicines, foods, dyes, or preservatives -pregnant or trying to get pregnant -breast-feeding How should I use this medicine? This medicine is for injection under the skin. If you get this medicine at home, you will be taught how to prepare and give this medicine. Refer to the Instructions for Use that come with your medication packaging. Use exactly as directed. Take your medicine at regular intervals. Do not take your medicine more often than directed. It is important that you put your used needles and syringes in a special sharps container. Do not put them in a trash can. If you do not have a sharps container, call your pharmacist or healthcare provider to get one. Talk to your pediatrician regarding the use of this medicine in children. Special care may be needed. Overdosage: If you think you have taken too much of this medicine contact a poison control center or emergency room at once. NOTE: This medicine is only for you. Do not share this medicine with others. What if I miss a dose? It is important not to miss your dose. Call your doctor or health care professional if you miss a  dose. What may interact with this medicine? This medicine may interact with the following medications: -medicines that may cause a release of neutrophils, such as lithium This list may not describe all possible interactions. Give your health care provider a list of all the medicines, herbs, non-prescription drugs, or dietary supplements you use. Also tell them if you smoke, drink alcohol, or use illegal drugs. Some items may interact with your medicine. What should I watch for while using this medicine? You may need blood work done while you are taking this medicine. What side effects may I notice from receiving this medicine? Side effects that you should report to your doctor or health care professional as soon as possible: -allergic reactions like skin rash, itching or hives, swelling of the face, lips, or tongue -blood in the urine -dark urine -dizziness -fast heartbeat -feeling faint -shortness of breath or breathing problems -signs and symptoms of infection like fever or chills; cough; or sore throat -signs and symptoms of kidney injury like trouble passing urine or change in the amount of urine -stomach or side pain, or pain at the shoulder -sweating -swelling of the legs, ankles, or abdomen -tiredness Side effects that usually do not require medical attention (report to your doctor or health care professional if they continue or are bothersome): -bone pain -headache -muscle pain -vomiting This list may not describe all possible side effects. Call your doctor for medical advice about side effects. You may report side effects to FDA at 1-800-FDA-1088. Where should I keep my medicine? Keep out of the reach of children. Store in a refrigerator between   2 and 8 degrees C (36 and 46 degrees F). Keep in carton to protect from light. Throw away this medicine if it is left out of the refrigerator for more than 5 consecutive days. Throw away any unused medicine after the expiration  date. NOTE: This sheet is a summary. It may not cover all possible information. If you have questions about this medicine, talk to your doctor, pharmacist, or health care provider.  2018 Elsevier/Gold Standard (2015-07-01 19:07:04)  

## 2017-11-26 ENCOUNTER — Telehealth: Payer: Self-pay | Admitting: Adult Health

## 2017-11-26 ENCOUNTER — Inpatient Hospital Stay: Payer: Medicare Other

## 2017-11-26 MED ORDER — TBO-FILGRASTIM 480 MCG/0.8ML ~~LOC~~ SOSY
PREFILLED_SYRINGE | SUBCUTANEOUS | Status: AC
  Filled 2017-11-26: qty 0.8

## 2017-11-26 NOTE — Telephone Encounter (Signed)
Patient requested to cancel inj appt. Patient requested to have inj included in treatment during next treatment appt.

## 2017-11-30 ENCOUNTER — Inpatient Hospital Stay: Payer: Medicare Other

## 2017-11-30 ENCOUNTER — Encounter: Payer: Self-pay | Admitting: Adult Health

## 2017-11-30 ENCOUNTER — Other Ambulatory Visit: Payer: Medicare Other

## 2017-11-30 ENCOUNTER — Inpatient Hospital Stay (HOSPITAL_BASED_OUTPATIENT_CLINIC_OR_DEPARTMENT_OTHER): Payer: Medicare Other | Admitting: Adult Health

## 2017-11-30 ENCOUNTER — Encounter: Payer: Self-pay | Admitting: *Deleted

## 2017-11-30 ENCOUNTER — Ambulatory Visit: Payer: Medicare Other

## 2017-11-30 VITALS — BP 149/74 | HR 76 | Temp 98.2°F | Ht 63.0 in | Wt 131.0 lb

## 2017-11-30 DIAGNOSIS — C50412 Malignant neoplasm of upper-outer quadrant of left female breast: Secondary | ICD-10-CM

## 2017-11-30 DIAGNOSIS — Z171 Estrogen receptor negative status [ER-]: Secondary | ICD-10-CM

## 2017-11-30 DIAGNOSIS — H66009 Acute suppurative otitis media without spontaneous rupture of ear drum, unspecified ear: Secondary | ICD-10-CM | POA: Diagnosis not present

## 2017-11-30 DIAGNOSIS — Z86000 Personal history of in-situ neoplasm of breast: Secondary | ICD-10-CM

## 2017-11-30 DIAGNOSIS — D0511 Intraductal carcinoma in situ of right breast: Secondary | ICD-10-CM

## 2017-11-30 DIAGNOSIS — Z5189 Encounter for other specified aftercare: Secondary | ICD-10-CM | POA: Diagnosis not present

## 2017-11-30 DIAGNOSIS — Z5111 Encounter for antineoplastic chemotherapy: Secondary | ICD-10-CM | POA: Diagnosis not present

## 2017-11-30 DIAGNOSIS — Z95828 Presence of other vascular implants and grafts: Secondary | ICD-10-CM

## 2017-11-30 DIAGNOSIS — M810 Age-related osteoporosis without current pathological fracture: Secondary | ICD-10-CM

## 2017-11-30 DIAGNOSIS — H66001 Acute suppurative otitis media without spontaneous rupture of ear drum, right ear: Secondary | ICD-10-CM

## 2017-11-30 LAB — CBC WITH DIFFERENTIAL/PLATELET
BASOS ABS: 0 10*3/uL (ref 0.0–0.1)
Basophils Relative: 2 %
Eosinophils Absolute: 0 10*3/uL (ref 0.0–0.5)
Eosinophils Relative: 2 %
HCT: 27.4 % — ABNORMAL LOW (ref 34.8–46.6)
Hemoglobin: 8.9 g/dL — ABNORMAL LOW (ref 11.6–15.9)
LYMPHS ABS: 0.6 10*3/uL — AB (ref 0.9–3.3)
LYMPHS PCT: 30 %
MCH: 31 pg (ref 25.1–34.0)
MCHC: 32.5 g/dL (ref 31.5–36.0)
MCV: 95.5 fL (ref 79.5–101.0)
MONO ABS: 0.3 10*3/uL (ref 0.1–0.9)
Monocytes Relative: 17 %
Neutro Abs: 0.9 10*3/uL — ABNORMAL LOW (ref 1.5–6.5)
Neutrophils Relative %: 49 %
Platelets: 126 10*3/uL — ABNORMAL LOW (ref 145–400)
RBC: 2.87 MIL/uL — AB (ref 3.70–5.45)
RDW: 15.1 % — ABNORMAL HIGH (ref 11.2–14.5)
WBC: 1.9 10*3/uL — AB (ref 3.9–10.3)

## 2017-11-30 LAB — COMPREHENSIVE METABOLIC PANEL
ALT: 20 U/L (ref 0–44)
AST: 18 U/L (ref 15–41)
Albumin: 3.9 g/dL (ref 3.5–5.0)
Alkaline Phosphatase: 62 U/L (ref 38–126)
Anion gap: 7 (ref 5–15)
BILIRUBIN TOTAL: 0.3 mg/dL (ref 0.3–1.2)
BUN: 10 mg/dL (ref 8–23)
CHLORIDE: 109 mmol/L (ref 98–111)
CO2: 25 mmol/L (ref 22–32)
Calcium: 8.9 mg/dL (ref 8.9–10.3)
Creatinine, Ser: 0.66 mg/dL (ref 0.44–1.00)
GFR calc Af Amer: >60 mL/min (ref >60)
GLUCOSE: 95 mg/dL (ref 70–99)
Potassium: 3.7 mmol/L (ref 3.5–5.1)
Sodium: 141 mmol/L (ref 135–145)
TOTAL PROTEIN: 6.1 g/dL — AB (ref 6.5–8.1)

## 2017-11-30 MED ORDER — AZITHROMYCIN 250 MG PO TABS: ORAL_TABLET | ORAL | 0 refills | Status: DC

## 2017-11-30 MED ORDER — HEPARIN SOD (PORK) LOCK FLUSH 100 UNIT/ML IV SOLN
500.0000 [IU] | Freq: Once | INTRAVENOUS | Status: AC
  Administered 2017-11-30: 500 [IU]
  Filled 2017-11-30: qty 5

## 2017-11-30 MED ORDER — SODIUM CHLORIDE 0.9% FLUSH
10.0000 mL | Freq: Once | INTRAVENOUS | Status: AC
  Administered 2017-11-30: 10 mL
  Filled 2017-11-30: qty 10

## 2017-11-30 NOTE — Progress Notes (Addendum)
[facility]  [phone] [phone]     ID: Stephanie Bautista DOB: 10/05/1950  MR#: [id number]  EQA#:[id number]  Patient Care Team: Haywood Pao, MD as PCP - General (Internal Medicine) Magrinat, Virgie Dad, MD as Consulting Physician (Oncology) Rolm Bookbinder, MD as Consulting Physician (General Surgery) Maisie Fus, MD as Consulting Physician (Obstetrics and Gynecology) Delrae Rend, MD as Consulting Physician (Endocrinology) Verner Chol, MD as Consulting Physician (Sports Medicine) Lavonia Dana, MD as Referring Physician (Otolaryngology) OTHER MD:  CHIEF COMPLAINT: Triple negative breast cancer  CURRENT TREATMENT: Neoadjuvant chemotherapy  HISTORY OF CURRENT ILLNESS: From the original intake note:  I saw Stephanie Bautista remotely for her history of ductal carcinoma in situ of the right breast, treated with lumpectomy 09/02/1994, with the pathology showing ([id number]) ductal carcinoma in situ.  There was no invasive component.  This was followed by radiation given between 09/21/94 and 11/04/94, with a total of 97989 centigray to the right breast and a total of 604 0 cGy to the tumor bed.  She notes that she didn't take any anti-estrogen therapy at that time.  More recently, Stephanie Bautista had routine screening mammography at [facility] on 07/16/2017 showing a possible abnormality in the left breast.  The breast density was category B.  She underwent left breast ultrasound on 07/16/2017 showing: a 1.5 cm irregular mass in the left breast upper outer quadrant posterior depth is consistent with carcinoma. A 6 mm irregular mass in the left breast upper outer quadrant posterior depth was also highly suggestive for malignancy. A lymph node in the left axillary tail was read as suspicious of malignancy.   Accordingly on 07/22/2017 she proceeded to biopsy of the left breast area in question as well as the suspicious left axillary lymph node. The pathology from this  procedure showed ([id number]): Lymph node, needle/core biopsy, left axilla with no evidence of carcinoma-- concordant. Breast, left, needle core biopsy, invasive ductal carcinoma, grade 3, prognostic indicators significant for: ER, 0% negative and PR, 0% negative. Proliferation marker Ki67 at 50%. HER2 negative signals ratio is 1.36, and the number per cell 1.70 .Breast, left, needle core biopsy, architectural distortion consistent with fibroadenoma--discordant.  She has a bilateral breast MRI scheduled for 07/28/2017.  The patient's subsequent history is as detailed below.  INTERVAL HISTORY: Stephanie Bautista returns today for a follow-up and treatment of her triple negative breast cancer accompanied by her husband.  She has completed neoadjuvant treatment with Doxorubicin and Cyclophosphamide.  She is now receiving neo adjuvant Paclitaxel and Carboplatin, day 1 and 8 on a 21 day cycle with onpro support for day 9 (due to neutropenia).  Today is cycle 2 day 8 of treatment.     REVIEW OF SYSTEMS: Stephanie Bautista has continued to have difficulty since her last treatment.  Her Sandoval was 2 at her last appointment last week prior to receiving Paclitaxel and Carboplatin.  I ordered two Granix injections to boost her white blood cells.  She tells me that she was only able to tolerate one.  She felt ear pain, and significant bone pain following the injections.    Stephanie Bautista denies peripheral neuropathy.  She continues to feel the tumor in her breast.  She was very fatigued last week, but slowly has improved since then.  Otherwise, a detailed ROS was non contributory.     PAST MEDICAL HISTORY: Past Medical History:  Diagnosis Date  . Acoustic neuroma ([place])   . Adenomatous colon polyp 12/2002  . Arthritis   .  Breast cancer ([place]) 1996   right breast cancer in 1996, left breast cancer in 2019  . Cholelithiases   . Esophageal stricture   . Family history of breast cancer   . Family history of ovarian cancer   . Family history of  pancreatic cancer   . Family history of prostate cancer   . GERD (gastroesophageal reflux disease)   . Hemorrhoids   . Hiatal hernia   . History of colon polyps   . Inguinal hernia   . UTI (lower urinary tract infection)   As far as PMHx she has had intermittent back pain (spinal stenosis) x 2 years,  Osteoporosis, random migraines, early cataracts and early hear loss due to schwannoma.   PAST SURGICAL HISTORY: Past Surgical History:  Procedure Laterality Date  . BREAST LUMPECTOMY    . CESAREAN SECTION     2x  . CHOLECYSTECTOMY  2008  . HEMORRHOID SURGERY  2007  . INGUINAL HERNIA REPAIR  1968  . PARTIAL HYSTERECTOMY  1996  . PORTACATH PLACEMENT Right 08/05/2017   Procedure: INSERTION PORT-A-CATH WITH [place];  Surgeon: Rolm Bookbinder, MD;  Location: [place];  Service: General;  Laterality: Right;  . TONSILLECTOMY      FAMILY HISTORY Family History  Problem Relation Age of Onset  . Heart disease Mother   . Melanoma Mother        dx in her 54s  . Ovarian cancer Mother 21  . Heart disease Father   . Diabetes Father   . Stroke Father   . Ovarian cancer Sister 3  . Pancreatic cancer Maternal Aunt   . Bone cancer Maternal Uncle   . Breast cancer Paternal Aunt   . Non-Hodgkin's lymphoma Maternal Grandmother   . Heart disease Maternal Grandfather   . Heart disease Paternal Grandmother   . Prostate cancer Paternal Grandfather   . Stroke Maternal Uncle   . Bone cancer Maternal Uncle   . Cancer Maternal Aunt        NOS  . Breast cancer Cousin        daughter of mat aunt with NOS cancer  Her mother died from CHF at age 22. Her father died from cardiac issues at age 27. She doesn't have any brothers, but has 3 sisters. She had one sister and her mother with ovarian cancer. Her sister was age 8 when she was diagnosed with ovarian cancer and her mother was in her late 54's when she was diagnosed with ovarian cancer. Her sister was not tested genetically following  this. Her maternal grandmother had non-hodgkins lymphoma. Her paternal grandfather had prostate cancer. Her paternal aunt had breast cancer. Her paternal aunt's daughter had breast cancer. She had several uncles that had prostate cancer that spread to the bones.   GYNECOLOGIC HISTORY:  No LMP recorded. Patient has had a hysterectomy. Menarche: 67 years old Age at first live birth: 80 years old Stephanie Bautista P2 LMP: hysterectomy at age 64 S/p Hysterectomy with USO (L)  SOCIAL HISTORY: She has been retired from Medical sales representative work since 05/26/2017. Her husband, Kasandra Knudsen has been retired from the [employer] for the past 9 years. Their son is Leroy Sea is 72 and a IT trainer. Their daughter, Adonis Brook lives in [facility], is 77 and works in child care at [employer]. The patient has 6 grandchildren ( 2 boys and 4 girls). She is a Psychologist, forensic.      ADVANCED DIRECTIVES:    HEALTH MAINTENANCE: Social History  Tobacco Use  . Smoking status: Never Smoker  . Smokeless tobacco: Never Used  Substance Use Topics  . Alcohol use: No  . Drug use: No     Colonoscopy: Dr. Fuller Plan.   PAP: Dr Nori Riis  Bone density: at Dr. Verlon Au office/ osteoporosis.    Allergies  Allergen Reactions  . Amoxicillin Itching and Rash    Current Outpatient Medications  Medication Sig Dispense Refill  . acetaminophen (TYLENOL) 500 MG tablet Take 500 mg by mouth every 6 (six) hours as needed.    Marland Kitchen alum & mag hydroxide-simeth (MAALOX/MYLANTA) 200-200-20 MG/5ML suspension Take by mouth every 6 (six) hours as needed for indigestion or heartburn.    . baclofen (LIORESAL) 10 MG tablet Take 10 mg by mouth as needed for muscle spasms.    . ciprofloxacin (CIPRO) 500 MG tablet Take one tablet twice a day for 5 days as directed 30 tablet 1  . clotrimazole (MYCELEX) 10 MG troche Take 1 lozenge (10 mg total) by mouth 4 (four) times daily. 56 lozenge 0  . dexamethasone (DECADRON) 4 MG tablet Take 2 tablets by mouth once a day on the day  after chemotherapy and then take 2 tablets two times a day for 2 days. Take with food. 30 tablet 1  . lidocaine-prilocaine (EMLA) cream Apply to affected area once 30 g 3  . LORazepam (ATIVAN) 0.5 MG tablet Take 1 tablet (0.5 mg total) by mouth at bedtime as needed (Nausea or vomiting). 30 tablet 0  . nystatin (MYCOSTATIN) 100000 UNIT/ML suspension Take 5 mLs (500,000 Units total) by mouth 4 (four) times daily. 240 mL 1  . omeprazole (PRILOSEC) 40 MG capsule Take 1 capsule (40 mg total) by mouth 2 (two) times daily. 60 capsule 11  . predniSONE (DELTASONE) 5 MG tablet 6 tab x 1 day, 5 tab x 1 day, 4 tab x 1 day, 3 tab x 1 day, 2 tab x 1 day, 1 tab x 1 day, stop 21 tablet 0  . prochlorperazine (COMPAZINE) 10 MG tablet Take 1 tablet (10 mg total) by mouth every 6 (six) hours as needed (Nausea or vomiting). 30 tablet 1  . promethazine (PHENERGAN) 12.5 MG tablet 1-2 p.o. every 6 hours as needed for nausea or headache 45 tablet 1  . traMADol (ULTRAM) 50 MG tablet 1-2 tablets every 6 hours as needed for pain 30 tablet 0  . azithromycin (ZITHROMAX) 250 MG tablet 2 tabs on day 1 then 1 tab daily until complete 6 each 0   No current facility-administered medications for this visit.     OBJECTIVE:  Vitals:   11/30/17 0909  BP: (!) 149/74  Pulse: 76  Temp: 98.2 F (36.8 C)  SpO2: 100%     Body mass index is 23.21 kg/m.   Wt Readings from Last 3 Encounters:  11/30/17 131 lb (59.4 kg)  11/23/17 132 lb 14.4 oz (60.3 kg)  11/09/17 133 lb (60.3 kg)  ECOG FS:1 - Symptomatic but completely ambulatory GENERAL: Patient is a well appearing female in no acute distress HEENT:  Sclerae anicteric.  Right TM is full, BLMs blurred, and erythematous, left TM normal, BLMs visible, fluid noted however.  Oropharynx clear and moist. No ulcerations or evidence of oropharyngeal candidiasis. Neck is supple.  NODES:  No cervical, supraclavicular, or axillary lymphadenopathy palpated.  BREAST EXAM:  deferred LUNGS:   Clear to auscultation bilaterally.  No wheezes or rhonchi. HEART:  Regular rate and rhythm. No murmur appreciated. ABDOMEN:  Soft, nontender.  Positive,  normoactive bowel sounds. No organomegaly palpated. MSK:  No focal spinal tenderness to palpation. Full range of motion bilaterally in the upper extremities. EXTREMITIES:  No peripheral edema.   SKIN:  Clear with no obvious rashes or skin changes. No nail dyscrasia. NEURO:  Nonfocal. Well oriented.  Appropriate affect.    LAB RESULTS:  CMP     Component Value Date/Time   NA 141 11/30/2017 0829   K 3.7 11/30/2017 0829   CL 109 11/30/2017 0829   CO2 25 11/30/2017 0829   GLUCOSE 95 11/30/2017 0829   BUN 10 11/30/2017 0829   CREATININE 0.66 11/30/2017 0829   CREATININE 0.81 07/27/2017 1007   CREATININE 0.69 11/16/2011 1054   CALCIUM 8.9 11/30/2017 0829   PROT 6.1 (L) 11/30/2017 0829   ALBUMIN 3.9 11/30/2017 0829   AST 18 11/30/2017 0829   AST 16 07/27/2017 1007   ALT 20 11/30/2017 0829   ALT 12 07/27/2017 1007   ALKPHOS 62 11/30/2017 0829   BILITOT 0.3 11/30/2017 0829   BILITOT 0.6 07/27/2017 1007   GFRNONAA >60 11/30/2017 0829   GFRNONAA >60 07/27/2017 1007   GFRAA >60 11/30/2017 0829   GFRAA >60 07/27/2017 1007    No results found for: TOTALPROTELP, ALBUMINELP, A1GS, A2GS, BETS, BETA2SER, GAMS, MSPIKE, SPEI  No results found for: KPAFRELGTCHN, LAMBDASER, KAPLAMBRATIO  Lab Results  Component Value Date   WBC 1.9 (L) 11/30/2017   NEUTROABS 0.9 (L) 11/30/2017   HGB 8.9 (L) 11/30/2017   HCT 27.4 (L) 11/30/2017   MCV 95.5 11/30/2017   PLT 126 (L) 11/30/2017    @LASTCHEMISTRY@  No results found for: LABCA2  No components found for: LABCAN125  No results for input(s): INR in the last 168 hours.  No results found for: LABCA2  No results found for: CAN199  No results found for: CAN125  No results found for: CAN153  No results found for: CA2729  No components found for: HGQUANT  No results found for: CEA1  / No results found for: CEA1   No results found for: AFPTUMOR  No results found for: CHROMOGRNA  No results found for: PSA1  Appointment on 11/30/2017  Component Date Value Ref Range Status  . Sodium 11/30/2017 141  135 - 145 mmol/L Final   Please note reference intervals were recently updated.  . Potassium 11/30/2017 3.7  3.5 - 5.1 mmol/L Final  . Chloride 11/30/2017 109  98 - 111 mmol/L Final  . CO2 11/30/2017 25  22 - 32 mmol/L Final  . Glucose, Bld 11/30/2017 95  70 - 99 mg/dL Final  . BUN 11/30/2017 10  8 - 23 mg/dL Final   Please note change in reference range.  . Creatinine, Ser 11/30/2017 0.66  0.44 - 1.00 mg/dL Final  . Calcium 11/30/2017 8.9  8.9 - 10.3 mg/dL Final  . Total Protein 11/30/2017 6.1* 6.5 - 8.1 g/dL Final  . Albumin 11/30/2017 3.9  3.5 - 5.0 g/dL Final  . AST 11/30/2017 18  15 - 41 U/L Final  . ALT 11/30/2017 20  0 - 44 U/L Final  . Alkaline Phosphatase 11/30/2017 62  38 - 126 U/L Final  . Total Bilirubin 11/30/2017 0.3  0.3 - 1.2 mg/dL Final  . GFR calc non Af Amer 11/30/2017 >60  >60 mL/min Final  . GFR calc Af Amer 11/30/2017 >60  >60 mL/min Final   Comment: (NOTE) The eGFR has been calculated using the CKD EPI equation. This calculation has not been validated in all clinical situations.   eGFR's persistently <60 mL/min signify possible Chronic Kidney Disease.   . Anion gap 11/30/2017 7  5 - 15 Final   Performed at [facility], [address] [place], [place] [address]  . WBC 11/30/2017 1.9* 3.9 - 10.3 K/uL Final  . RBC 11/30/2017 2.87* 3.70 - 5.45 MIL/uL Final  . Hemoglobin 11/30/2017 8.9* 11.6 - 15.9 g/dL Final  . HCT 11/30/2017 27.4* 34.8 - 46.6 % Final  . MCV 11/30/2017 95.5  79.5 - 101.0 fL Final  . MCH 11/30/2017 31.0  25.1 - 34.0 pg Final  . MCHC 11/30/2017 32.5  31.5 - 36.0 g/dL Final  . RDW 11/30/2017 15.1* 11.2 - 14.5 % Final  . Platelets 11/30/2017 126* 145 - 400 K/uL Final  . Neutrophils Relative % 11/30/2017  49  % Final  . Neutro Abs 11/30/2017 0.9* 1.5 - 6.5 K/uL Final  . Lymphocytes Relative 11/30/2017 30  % Final  . Lymphs Abs 11/30/2017 0.6* 0.9 - 3.3 K/uL Final  . Monocytes Relative 11/30/2017 17  % Final  . Monocytes Absolute 11/30/2017 0.3  0.1 - 0.9 K/uL Final  . Eosinophils Relative 11/30/2017 2  % Final  . Eosinophils Absolute 11/30/2017 0.0  0.0 - 0.5 K/uL Final  . Basophils Relative 11/30/2017 2  % Final  . Basophils Absolute 11/30/2017 0.0  0.0 - 0.1 K/uL Final   Performed at [facility], [address] [place], [place] [address]    (this displays the last labs from the last 3 days)  No results found for: TOTALPROTELP, ALBUMINELP, A1GS, A2GS, BETS, BETA2SER, GAMS, MSPIKE, SPEI (this displays SPEP labs)  No results found for: KPAFRELGTCHN, LAMBDASER, KAPLAMBRATIO (kappa/lambda light chains)  No results found for: HGBA, HGBA2QUANT, HGBFQUANT, HGBSQUAN (Hemoglobinopathy evaluation)   No results found for: LDH  No results found for: IRON, TIBC, IRONPCTSAT (Iron and TIBC)  No results found for: FERRITIN  Urinalysis    Component Value Date/Time   BILIRUBINUR neg 04/06/2014 1019   PROTEINUR neg 04/06/2014 1019   UROBILINOGEN 0.2 04/06/2014 1019   NITRITE neg 04/06/2014 1019   LEUKOCYTESUR Negative 04/06/2014 1019     STUDIES: No results found.   ELIGIBLE FOR AVAILABLE RESEARCH PROTOCOL: UPBEAT  ASSESSMENT: 67 y.o. [place] woman  (1) status post right lumpectomy 09/02/1994 for ductal carcinoma in situ  (a) status post adjuvant radiation 09/21/1994 through 11/04/1994, total 5040 centigrade to the breast and 604 0 cGy to the tumor bed  (b) did not receive adjuvant antiestrogens  (2) status post left breast upper outer quadrant biopsy 07/22/2017 for a clinical T1c pN0, stage IB invasive ductal carcinoma, grade 3, triple negative, with an MIB-1 of 50%.  (a) lymph node biopsy on the same day was negative for malignancy (concordant)  (b)  a second area of architectural distortion was also biopsied, read as fibroadenoma (discordant)  (c) a third area of architectural distortion has not yet been biopsied  (d) breast MRI scheduled for 07/28/2017 shows a clinical T2 N0 tumor  (3) genetics testing 08/02/2017 through the Multi-Gene Panel offered by [facility] found no deleterious mutations in ALK, APC, ATM, AXIN2,BAP1,  BARD1, BLM, BMPR1A, BRCA1, BRCA2, BRIP1, CASR, CDC73, CDH1, CDK4, CDKN1B, CDKN1C, CDKN2A (p14ARF), CDKN2A (p16INK4a), CEBPA, CHEK2, CTNNA1, DICER1, DIS3L2, EGFR (c.2369C>T, p.Thr790Met variant only), EPCAM (Deletion/duplication testing only), FH, FLCN, GATA2, GPC3, GREM1 (Promoter region deletion/duplication testing only), HOXB13 (c.251G>A, p.Gly84Glu), HRAS, KIT, MAX, MEN1, MET, MITF (c.952G>A, p.Glu318Lys variant only), MLH1, MSH2, MSH3, MSH6, MUTYH, NBN, NF1, NF2, NTHL1,   PALB2, PDGFRA, PHOX2B, PMS2, POLD1, POLE, POT1, PRKAR1A, PTCH1, PTEN, RAD50, RAD51C, RAD51D, RB1, RECQL4, RET, RUNX1, SDHAF2, SDHA (sequence changes only), SDHB, SDHC, SDHD, SMAD4, SMARCA4, SMARCB1, SMARCE1, STK11, SUFU, TERT, TERT, TMEM127, TP53, TSC1, TSC2, VHL, WRN and WT1.  The report date is August 02, 2017.  (a) WT1 c.332C>T VUS identified on the Multi-cancer gene panel.    (4) chemotherapy consisting of doxorubicin/ cyclophosphamide in dose dense fashion x4 started 08/24/2017, completed 10/12/2017,  followed by paclitaxel/ carboplatin weekly   (a) carboplatin/paclitaxel discontinued after dose #5, 11/23/2017, due to cytopenias  (5) definitive surgery to follow  (6) consider SWOG M5465 study (post-op pembrolizumab) vs further adjuvant chemotherapy (CMF)  (7) adjuvant radiation to follow as appropriate  PLAN: Senita is doing moderately well today.  Her anc is 0.9 today.  She will not be able to receive chemotherapy.  She met with Dr. Jana Hakim today to review her options.  She will not receive any further chemotherapy.  She will undergo MRI breasts, f/u  with Dr. Chrissie Noa, and proceed to surgery.  Dr. Jana Hakim will see her back in about one month to review her pathology results and to discuss her options.    I put Hae on Azithromycin for acute suppurative otitis media.  I recommended she take flonase daily.    I reviewed this plan with Bary Castilla, RN, our nurse navigator who will help operationalize the process of getting Leland her MRI and f/u with Dr. Donne Hazel.     Wilber Bihari, NP  11/30/17 9:29 PM Medical Oncology and Hematology [facility] [address] [place], [place] [address] Tel. [phone]    Fax. [phone]   ADDENDUM: Pammy's bone marrow is exhausted at this point, and even with granulocyte support she is not going to be able to be treated today.  In addition she tolerated the Granix very poorly.  She did receive 5 of the 12 treatments in addition to the full 4 cycles of doxorubicin and cyclophosphamide.  While we could sputter on and try to treat her every few weeks as her marrow allows, I think the better option is to stop chemotherapy now.  She does still have palpable disease so I believe she will qualify for this walk study, which I discussed at length with her today.  She also met with the research nurse.  Accordingly we are going to leave the port in place.  If she does not qualify for the SWOG study we will consider CMF chemotherapy versus observation.  Of course at some point she will need adjuvant radiation  Stephanie Bautista had many questions regarding this and of course she is very motivated to complete the chemotherapy as originally intended.  I reassured her that the difference will be small and again encouraged her to consider the SWOG study assuming she does qualify, which I believe she will.  I personally saw this patient and performed a substantive portion of this encounter with the listed APP documented above.   Chauncey Cruel, MD Medical Oncology and Hematology [facility] [address] [place], [place] [address] Tel. [phone]    Fax. [phone]

## 2017-11-30 NOTE — Patient Instructions (Signed)

## 2017-12-04 ENCOUNTER — Ambulatory Visit (HOSPITAL_COMMUNITY)
Admission: RE | Admit: 2017-12-04 | Discharge: 2017-12-04 | Disposition: A | Discharge status: Home / Self Care | Payer: Medicare Other | Source: Ambulatory Visit | Attending: Adult Health | Admitting: Adult Health

## 2017-12-04 DIAGNOSIS — Z171 Estrogen receptor negative status [ER-]: Secondary | ICD-10-CM | POA: Diagnosis not present

## 2017-12-04 DIAGNOSIS — C50412 Malignant neoplasm of upper-outer quadrant of left female breast: Secondary | ICD-10-CM | POA: Diagnosis not present

## 2017-12-04 DIAGNOSIS — C50912 Malignant neoplasm of unspecified site of left female breast: Secondary | ICD-10-CM | POA: Diagnosis not present

## 2017-12-04 MED ORDER — GADOBENATE DIMEGLUMINE 529 MG/ML IV SOLN
15.0000 mL | Freq: Once | INTRAVENOUS | Status: AC | PRN
  Administered 2017-12-04: 13 mL via INTRAVENOUS

## 2017-12-07 ENCOUNTER — Other Ambulatory Visit: Payer: Medicare Other

## 2017-12-07 ENCOUNTER — Ambulatory Visit: Payer: Medicare Other

## 2017-12-09 ENCOUNTER — Other Ambulatory Visit: Payer: Self-pay | Admitting: Oncology

## 2017-12-09 ENCOUNTER — Other Ambulatory Visit: Payer: Self-pay | Admitting: General Surgery

## 2017-12-09 DIAGNOSIS — Z170 Estrogen receptor positive status [ER+]: Principal | ICD-10-CM

## 2017-12-09 DIAGNOSIS — C50412 Malignant neoplasm of upper-outer quadrant of left female breast: Secondary | ICD-10-CM | POA: Diagnosis not present

## 2017-12-13 ENCOUNTER — Other Ambulatory Visit: Payer: Self-pay

## 2017-12-13 ENCOUNTER — Encounter (HOSPITAL_BASED_OUTPATIENT_CLINIC_OR_DEPARTMENT_OTHER): Payer: Self-pay | Admitting: *Deleted

## 2017-12-14 ENCOUNTER — Encounter (HOSPITAL_BASED_OUTPATIENT_CLINIC_OR_DEPARTMENT_OTHER)
Admission: RE | Admit: 2017-12-14 | Discharge: 2017-12-14 | Disposition: A | Discharge status: Home / Self Care | Payer: Medicare Other | Source: Ambulatory Visit | Attending: General Surgery | Admitting: General Surgery

## 2017-12-14 DIAGNOSIS — Z853 Personal history of malignant neoplasm of breast: Secondary | ICD-10-CM | POA: Diagnosis not present

## 2017-12-14 DIAGNOSIS — L905 Scar conditions and fibrosis of skin: Secondary | ICD-10-CM | POA: Diagnosis not present

## 2017-12-14 DIAGNOSIS — Z79899 Other long term (current) drug therapy: Secondary | ICD-10-CM | POA: Diagnosis not present

## 2017-12-14 DIAGNOSIS — C50412 Malignant neoplasm of upper-outer quadrant of left female breast: Secondary | ICD-10-CM | POA: Diagnosis not present

## 2017-12-14 DIAGNOSIS — N6092 Unspecified benign mammary dysplasia of left breast: Secondary | ICD-10-CM | POA: Diagnosis not present

## 2017-12-14 DIAGNOSIS — Z9221 Personal history of antineoplastic chemotherapy: Secondary | ICD-10-CM | POA: Diagnosis not present

## 2017-12-14 DIAGNOSIS — K219 Gastro-esophageal reflux disease without esophagitis: Secondary | ICD-10-CM | POA: Diagnosis not present

## 2017-12-14 LAB — DIFFERENTIAL
Abs Immature Granulocytes: 0 10*3/uL (ref 0.0–0.1)
Basophils Absolute: 0 10*3/uL (ref 0.0–0.1)
Basophils Relative: 1 %
EOS PCT: 2 %
Eosinophils Absolute: 0.1 10*3/uL (ref 0.0–0.7)
Immature Granulocytes: 0 %
LYMPHS PCT: 28 %
Lymphs Abs: 0.8 10*3/uL (ref 0.7–4.0)
MONO ABS: 0.4 10*3/uL (ref 0.1–1.0)
MONOS PCT: 14 %
NEUTROS PCT: 55 %
Neutro Abs: 1.5 10*3/uL — ABNORMAL LOW (ref 1.7–7.7)

## 2017-12-14 LAB — CBC
HEMATOCRIT: 35.7 % — AB (ref 36.0–46.0)
Hemoglobin: 11.4 g/dL — ABNORMAL LOW (ref 12.0–15.0)
MCH: 31.3 pg (ref 26.0–34.0)
MCHC: 31.9 g/dL (ref 30.0–36.0)
MCV: 98.1 fL (ref 78.0–100.0)
Platelets: 129 10*3/uL — ABNORMAL LOW (ref 150–400)
RBC: 3.64 MIL/uL — ABNORMAL LOW (ref 3.87–5.11)
RDW: 13.4 % (ref 11.5–15.5)
WBC: 2.7 10*3/uL — ABNORMAL LOW (ref 4.0–10.5)

## 2017-12-14 NOTE — Pre-Procedure Instructions (Signed)
Pt given Ensure and instructed to drink by 1100 day of surgery with teach back method.

## 2017-12-15 ENCOUNTER — Ambulatory Visit (HOSPITAL_BASED_OUTPATIENT_CLINIC_OR_DEPARTMENT_OTHER): Payer: Medicare Other | Admitting: Anesthesiology

## 2017-12-15 ENCOUNTER — Ambulatory Visit (HOSPITAL_COMMUNITY)
Admission: RE | Admit: 2017-12-15 | Discharge: 2017-12-15 | Disposition: A | Discharge status: Home / Self Care | Payer: Medicare Other | Source: Ambulatory Visit | Attending: General Surgery | Admitting: General Surgery

## 2017-12-15 ENCOUNTER — Encounter (HOSPITAL_BASED_OUTPATIENT_CLINIC_OR_DEPARTMENT_OTHER): Payer: Self-pay

## 2017-12-15 ENCOUNTER — Encounter (HOSPITAL_BASED_OUTPATIENT_CLINIC_OR_DEPARTMENT_OTHER)
Admission: RE | Disposition: A | Discharge status: Home / Self Care | Payer: Self-pay | Source: Ambulatory Visit | Attending: General Surgery

## 2017-12-15 ENCOUNTER — Ambulatory Visit (HOSPITAL_BASED_OUTPATIENT_CLINIC_OR_DEPARTMENT_OTHER)
Admission: RE | Admit: 2017-12-15 | Discharge: 2017-12-15 | Disposition: A | Discharge status: Home / Self Care | Payer: Medicare Other | Source: Ambulatory Visit | Attending: General Surgery | Admitting: General Surgery

## 2017-12-15 ENCOUNTER — Other Ambulatory Visit: Payer: Self-pay

## 2017-12-15 DIAGNOSIS — N6012 Diffuse cystic mastopathy of left breast: Secondary | ICD-10-CM | POA: Diagnosis not present

## 2017-12-15 DIAGNOSIS — C50412 Malignant neoplasm of upper-outer quadrant of left female breast: Secondary | ICD-10-CM

## 2017-12-15 DIAGNOSIS — Z79899 Other long term (current) drug therapy: Secondary | ICD-10-CM | POA: Diagnosis not present

## 2017-12-15 DIAGNOSIS — Z9221 Personal history of antineoplastic chemotherapy: Secondary | ICD-10-CM | POA: Insufficient documentation

## 2017-12-15 DIAGNOSIS — G8918 Other acute postprocedural pain: Secondary | ICD-10-CM | POA: Diagnosis not present

## 2017-12-15 DIAGNOSIS — N6089 Other benign mammary dysplasias of unspecified breast: Secondary | ICD-10-CM | POA: Diagnosis not present

## 2017-12-15 DIAGNOSIS — L905 Scar conditions and fibrosis of skin: Secondary | ICD-10-CM | POA: Insufficient documentation

## 2017-12-15 DIAGNOSIS — Z853 Personal history of malignant neoplasm of breast: Secondary | ICD-10-CM | POA: Insufficient documentation

## 2017-12-15 DIAGNOSIS — K219 Gastro-esophageal reflux disease without esophagitis: Secondary | ICD-10-CM | POA: Insufficient documentation

## 2017-12-15 DIAGNOSIS — D242 Benign neoplasm of left breast: Secondary | ICD-10-CM | POA: Diagnosis not present

## 2017-12-15 DIAGNOSIS — N6092 Unspecified benign mammary dysplasia of left breast: Secondary | ICD-10-CM | POA: Insufficient documentation

## 2017-12-15 DIAGNOSIS — Z170 Estrogen receptor positive status [ER+]: Principal | ICD-10-CM

## 2017-12-15 DIAGNOSIS — N6489 Other specified disorders of breast: Secondary | ICD-10-CM | POA: Diagnosis not present

## 2017-12-15 DIAGNOSIS — Z452 Encounter for adjustment and management of vascular access device: Secondary | ICD-10-CM | POA: Diagnosis not present

## 2017-12-15 HISTORY — PX: PORT-A-CATH REMOVAL: SHX5289

## 2017-12-15 HISTORY — PX: BREAST LUMPECTOMY WITH RADIOACTIVE SEED AND SENTINEL LYMPH NODE BIOPSY: SHX6550

## 2017-12-15 SURGERY — BREAST LUMPECTOMY WITH RADIOACTIVE SEED AND SENTINEL LYMPH NODE BIOPSY
Anesthesia: General | Site: Chest | Laterality: Right

## 2017-12-15 MED ORDER — FENTANYL CITRATE (PF) 100 MCG/2ML IJ SOLN
INTRAMUSCULAR | Status: AC
  Filled 2017-12-15: qty 2

## 2017-12-15 MED ORDER — PROMETHAZINE HCL 25 MG/ML IJ SOLN: 6.2500 mg | INTRAMUSCULAR | Status: DC | PRN

## 2017-12-15 MED ORDER — TECHNETIUM TC 99M SULFUR COLLOID FILTERED
1.0000 | Freq: Once | INTRAVENOUS | Status: AC | PRN
  Administered 2017-12-15: 1 via INTRADERMAL

## 2017-12-15 MED ORDER — ONDANSETRON HCL 4 MG/2ML IJ SOLN
INTRAMUSCULAR | Status: AC
  Filled 2017-12-15: qty 2

## 2017-12-15 MED ORDER — CIPROFLOXACIN IN D5W 400 MG/200ML IV SOLN
INTRAVENOUS | Status: AC
  Filled 2017-12-15: qty 200

## 2017-12-15 MED ORDER — BUPIVACAINE HCL (PF) 0.25 % IJ SOLN
INTRAMUSCULAR | Status: DC | PRN
  Administered 2017-12-15: 10 mL

## 2017-12-15 MED ORDER — EPHEDRINE SULFATE 50 MG/ML IJ SOLN
INTRAMUSCULAR | Status: DC | PRN
  Administered 2017-12-15: 10 mg via INTRAVENOUS

## 2017-12-15 MED ORDER — SODIUM CHLORIDE 0.9 % IJ SOLN
INTRAMUSCULAR | Status: AC
  Filled 2017-12-15: qty 10

## 2017-12-15 MED ORDER — LACTATED RINGERS IV SOLN
INTRAVENOUS | Status: DC
  Administered 2017-12-15 (×2): via INTRAVENOUS

## 2017-12-15 MED ORDER — DEXAMETHASONE SODIUM PHOSPHATE 4 MG/ML IJ SOLN
INTRAMUSCULAR | Status: DC | PRN
  Administered 2017-12-15: 10 mg via INTRAVENOUS

## 2017-12-15 MED ORDER — BUPIVACAINE HCL (PF) 0.25 % IJ SOLN
INTRAMUSCULAR | Status: AC
  Filled 2017-12-15: qty 30

## 2017-12-15 MED ORDER — PROPOFOL 10 MG/ML IV BOLUS
INTRAVENOUS | Status: DC | PRN
  Administered 2017-12-15: 130 mg via INTRAVENOUS

## 2017-12-15 MED ORDER — OXYCODONE HCL 5 MG PO TABS: 5.0000 mg | ORAL_TABLET | Freq: Once | ORAL | Status: DC | PRN

## 2017-12-15 MED ORDER — ONDANSETRON HCL 4 MG/2ML IJ SOLN
INTRAMUSCULAR | Status: DC | PRN
  Administered 2017-12-15: 4 mg via INTRAVENOUS

## 2017-12-15 MED ORDER — GABAPENTIN 100 MG PO CAPS
100.0000 mg | ORAL_CAPSULE | ORAL | Status: AC
  Administered 2017-12-15: 100 mg via ORAL

## 2017-12-15 MED ORDER — ROPIVACAINE HCL 5 MG/ML IJ SOLN
INTRAMUSCULAR | Status: DC | PRN
  Administered 2017-12-15: 30 mL

## 2017-12-15 MED ORDER — FENTANYL CITRATE (PF) 100 MCG/2ML IJ SOLN
50.0000 ug | INTRAMUSCULAR | Status: AC | PRN
  Administered 2017-12-15: 50 ug via INTRAVENOUS
  Administered 2017-12-15: 25 ug via INTRAVENOUS
  Administered 2017-12-15: 50 ug via INTRAVENOUS

## 2017-12-15 MED ORDER — ACETAMINOPHEN 500 MG PO TABS
ORAL_TABLET | ORAL | Status: AC
  Filled 2017-12-15: qty 2

## 2017-12-15 MED ORDER — MIDAZOLAM HCL 2 MG/2ML IJ SOLN
INTRAMUSCULAR | Status: AC
  Filled 2017-12-15: qty 2

## 2017-12-15 MED ORDER — OXYCODONE HCL 5 MG/5ML PO SOLN: 5.0000 mg | Freq: Once | ORAL | Status: DC | PRN

## 2017-12-15 MED ORDER — SCOPOLAMINE 1 MG/3DAYS TD PT72
MEDICATED_PATCH | TRANSDERMAL | Status: AC
  Filled 2017-12-15: qty 1

## 2017-12-15 MED ORDER — EPHEDRINE SULFATE 50 MG/ML IJ SOLN
INTRAMUSCULAR | Status: AC
  Filled 2017-12-15: qty 1

## 2017-12-15 MED ORDER — ACETAMINOPHEN 500 MG PO TABS
1000.0000 mg | ORAL_TABLET | ORAL | Status: AC
  Administered 2017-12-15: 1000 mg via ORAL

## 2017-12-15 MED ORDER — KETOROLAC TROMETHAMINE 30 MG/ML IJ SOLN: 30.0000 mg | Freq: Once | INTRAMUSCULAR | Status: DC | PRN

## 2017-12-15 MED ORDER — LIDOCAINE HCL (CARDIAC) PF 100 MG/5ML IV SOSY
PREFILLED_SYRINGE | INTRAVENOUS | Status: AC
  Filled 2017-12-15: qty 5

## 2017-12-15 MED ORDER — GLYCOPYRROLATE 0.2 MG/ML IJ SOLN
INTRAMUSCULAR | Status: DC | PRN
  Administered 2017-12-15: 0.2 mg via INTRAVENOUS

## 2017-12-15 MED ORDER — GABAPENTIN 100 MG PO CAPS
ORAL_CAPSULE | ORAL | Status: AC
  Filled 2017-12-15: qty 1

## 2017-12-15 MED ORDER — FENTANYL CITRATE (PF) 100 MCG/2ML IJ SOLN
25.0000 ug | INTRAMUSCULAR | Status: DC | PRN
  Administered 2017-12-15 (×2): 25 ug via INTRAVENOUS

## 2017-12-15 MED ORDER — DEXAMETHASONE SODIUM PHOSPHATE 10 MG/ML IJ SOLN
INTRAMUSCULAR | Status: AC
  Filled 2017-12-15: qty 1

## 2017-12-15 MED ORDER — CIPROFLOXACIN IN D5W 400 MG/200ML IV SOLN
400.0000 mg | INTRAVENOUS | Status: AC
  Administered 2017-12-15: 400 mg via INTRAVENOUS

## 2017-12-15 MED ORDER — SCOPOLAMINE 1 MG/3DAYS TD PT72
1.0000 | MEDICATED_PATCH | Freq: Once | TRANSDERMAL | Status: AC | PRN
  Administered 2017-12-15: 1 via TRANSDERMAL

## 2017-12-15 MED ORDER — METHYLENE BLUE 0.5 % INJ SOLN
INTRAVENOUS | Status: AC
  Filled 2017-12-15: qty 10

## 2017-12-15 MED ORDER — OXYCODONE HCL 5 MG PO TABS: 5.0000 mg | ORAL_TABLET | Freq: Four times a day (QID) | ORAL | 0 refills | Status: DC | PRN

## 2017-12-15 MED ORDER — LIDOCAINE HCL (CARDIAC) PF 100 MG/5ML IV SOSY
PREFILLED_SYRINGE | INTRAVENOUS | Status: DC | PRN
  Administered 2017-12-15: 60 mg via INTRAVENOUS

## 2017-12-15 MED ORDER — CHLORHEXIDINE GLUCONATE CLOTH 2 % EX PADS: 6.0000 | MEDICATED_PAD | Freq: Once | CUTANEOUS | Status: DC

## 2017-12-15 MED ORDER — PROPOFOL 10 MG/ML IV BOLUS
INTRAVENOUS | Status: AC
  Filled 2017-12-15: qty 20

## 2017-12-15 MED ORDER — ENSURE PRE-SURGERY PO LIQD: 296.0000 mL | Freq: Once | ORAL | Status: DC

## 2017-12-15 MED ORDER — MIDAZOLAM HCL 2 MG/2ML IJ SOLN
1.0000 mg | INTRAMUSCULAR | Status: DC | PRN
  Administered 2017-12-15: 2 mg via INTRAVENOUS

## 2017-12-15 MED ORDER — SODIUM CHLORIDE 0.9 % IJ SOLN
INTRAVENOUS | Status: DC | PRN
  Administered 2017-12-15: 5 mL

## 2017-12-15 MED ORDER — HEMOSTATIC AGENTS (NO CHARGE) OPTIME
TOPICAL | Status: DC | PRN
  Administered 2017-12-15: 1 via TOPICAL

## 2017-12-15 MED ORDER — CLONIDINE HCL (ANALGESIA) 100 MCG/ML EP SOLN
EPIDURAL | Status: DC | PRN
  Administered 2017-12-15: 50 ug

## 2017-12-15 SURGICAL SUPPLY — 58 items
APPLIER CLIP 9.375 MED OPEN (MISCELLANEOUS) ×3
BINDER BREAST LRG (GAUZE/BANDAGES/DRESSINGS) ×3 IMPLANT
BINDER BREAST MEDIUM (GAUZE/BANDAGES/DRESSINGS) IMPLANT
BINDER BREAST XLRG (GAUZE/BANDAGES/DRESSINGS) IMPLANT
BINDER BREAST XXLRG (GAUZE/BANDAGES/DRESSINGS) IMPLANT
BLADE SURG 15 STRL LF DISP TIS (BLADE) ×2 IMPLANT
BLADE SURG 15 STRL SS (BLADE) ×1
CANISTER SUC SOCK COL 7IN (MISCELLANEOUS) IMPLANT
CANISTER SUCT 1200ML W/VALVE (MISCELLANEOUS) IMPLANT
CHLORAPREP W/TINT 26ML (MISCELLANEOUS) ×3 IMPLANT
CLIP APPLIE 9.375 MED OPEN (MISCELLANEOUS) ×2 IMPLANT
CLIP VESOCCLUDE SM WIDE 6/CT (CLIP) ×3 IMPLANT
COVER BACK TABLE 60X90IN (DRAPES) ×3 IMPLANT
COVER MAYO STAND STRL (DRAPES) ×3 IMPLANT
COVER PROBE W GEL 5X96 (DRAPES) ×3 IMPLANT
DECANTER SPIKE VIAL GLASS SM (MISCELLANEOUS) ×3 IMPLANT
DERMABOND ADVANCED (GAUZE/BANDAGES/DRESSINGS) ×1
DERMABOND ADVANCED .7 DNX12 (GAUZE/BANDAGES/DRESSINGS) ×2 IMPLANT
DEVICE DUBIN W/COMP PLATE 8390 (MISCELLANEOUS) ×3 IMPLANT
DRAPE LAPAROSCOPIC ABDOMINAL (DRAPES) ×3 IMPLANT
DRAPE LAPAROTOMY 100X72 PEDS (DRAPES) ×3 IMPLANT
DRAPE UTILITY XL STRL (DRAPES) ×3 IMPLANT
ELECT COATED BLADE 2.86 ST (ELECTRODE) ×3 IMPLANT
ELECT REM PT RETURN 9FT ADLT (ELECTROSURGICAL) ×3
ELECTRODE REM PT RTRN 9FT ADLT (ELECTROSURGICAL) ×2 IMPLANT
GLOVE BIO SURGEON STRL SZ 6.5 (GLOVE) ×3 IMPLANT
GLOVE BIO SURGEON STRL SZ7 (GLOVE) ×9 IMPLANT
GLOVE BIOGEL PI IND STRL 7.5 (GLOVE) ×4 IMPLANT
GLOVE BIOGEL PI INDICATOR 7.5 (GLOVE) ×2
GOWN STRL REUS W/ TWL LRG LVL3 (GOWN DISPOSABLE) ×4 IMPLANT
GOWN STRL REUS W/TWL LRG LVL3 (GOWN DISPOSABLE) ×2
HEMOSTAT ARISTA ABSORB 3G PWDR (MISCELLANEOUS) IMPLANT
ILLUMINATOR WAVEGUIDE N/F (MISCELLANEOUS) IMPLANT
KIT MARKER MARGIN INK (KITS) ×3 IMPLANT
LIGHT WAVEGUIDE WIDE FLAT (MISCELLANEOUS) IMPLANT
NDL SAFETY ECLIPSE 18X1.5 (NEEDLE) ×2 IMPLANT
NEEDLE HYPO 18GX1.5 SHARP (NEEDLE) ×1
NEEDLE HYPO 25X1 1.5 SAFETY (NEEDLE) ×6 IMPLANT
NS IRRIG 1000ML POUR BTL (IV SOLUTION) IMPLANT
PACK BASIN DAY SURGERY FS (CUSTOM PROCEDURE TRAY) ×3 IMPLANT
PENCIL BUTTON HOLSTER BLD 10FT (ELECTRODE) ×3 IMPLANT
SLEEVE SCD COMPRESS KNEE MED (MISCELLANEOUS) ×3 IMPLANT
SPONGE LAP 4X18 RFD (DISPOSABLE) ×3 IMPLANT
STRIP CLOSURE SKIN 1/2X4 (GAUZE/BANDAGES/DRESSINGS) ×3 IMPLANT
SUT ETHILON 2 0 FS 18 (SUTURE) IMPLANT
SUT MNCRL AB 4-0 PS2 18 (SUTURE) ×6 IMPLANT
SUT MON AB 5-0 PS2 18 (SUTURE) IMPLANT
SUT SILK 2 0 SH (SUTURE) IMPLANT
SUT VIC AB 2-0 SH 27 (SUTURE) ×1
SUT VIC AB 2-0 SH 27XBRD (SUTURE) ×2 IMPLANT
SUT VIC AB 3-0 SH 27 (SUTURE) ×2
SUT VIC AB 3-0 SH 27X BRD (SUTURE) ×4 IMPLANT
SUT VIC AB 5-0 PS2 18 (SUTURE) IMPLANT
SYR CONTROL 10ML LL (SYRINGE) ×6 IMPLANT
TOWEL GREEN STERILE FF (TOWEL DISPOSABLE) ×3 IMPLANT
TOWEL OR NON WOVEN STRL DISP B (DISPOSABLE) ×3 IMPLANT
TUBE CONNECTING 20X1/4 (TUBING) IMPLANT
YANKAUER SUCT BULB TIP NO VENT (SUCTIONS) ×3 IMPLANT

## 2017-12-15 NOTE — Anesthesia Procedure Notes (Signed)
Anesthesia Regional Block: Pectoralis block   Pre-Anesthetic Checklist: ,, timeout performed, Correct Patient, Correct Site, Correct Laterality, Correct Procedure, Correct Position, site marked, Risks and benefits discussed,  Surgical consent,  Pre-op evaluation,  At surgeon's request and post-op pain management  Laterality: Left  Prep: chloraprep       Needles:  Injection technique: Single-shot  Needle Type: Echogenic Needle     Needle Length: 9cm      Additional Needles:   Procedures:,,,, ultrasound used (permanent image in chart),,,,  Narrative:  Start time: 12/15/2017 1:42 PM End time: 12/15/2017 1:51 PM Injection made incrementally with aspirations every 5 mL.  Performed by: Personally  Anesthesiologist: Myrtie Soman, MD  Additional Notes: Patient tolerated the procedure well without complications

## 2017-12-15 NOTE — Anesthesia Preprocedure Evaluation (Signed)
Anesthesia Evaluation  Patient identified by MRN, date of birth, ID band Patient awake    Reviewed: Allergy & Precautions, NPO status , Patient's Chart, lab work & pertinent test results  Airway Mallampati: II  TM Distance: >3 FB Neck ROM: Full    Dental no notable dental hx.    Pulmonary neg pulmonary ROS,    Pulmonary exam normal breath sounds clear to auscultation       Cardiovascular negative cardio ROS Normal cardiovascular exam Rhythm:Regular Rate:Normal     Neuro/Psych negative neurological ROS  negative psych ROS   GI/Hepatic Neg liver ROS, GERD  ,  Endo/Other  negative endocrine ROS  Renal/GU negative Renal ROS  negative genitourinary   Musculoskeletal negative musculoskeletal ROS (+)   Abdominal   Peds negative pediatric ROS (+)  Hematology  (+) anemia ,   Anesthesia Other Findings   Reproductive/Obstetrics negative OB ROS                             Anesthesia Physical Anesthesia Plan  ASA: II  Anesthesia Plan: General   Post-op Pain Management:  Regional for Post-op pain   Induction: Intravenous  PONV Risk Score and Plan: 3 and Ondansetron, Dexamethasone, Treatment may vary due to age or medical condition and Scopolamine patch - Pre-op  Airway Management Planned: LMA  Additional Equipment:   Intra-op Plan:   Post-operative Plan: Extubation in OR  Informed Consent: I have reviewed the patients History and Physical, chart, labs and discussed the procedure including the risks, benefits and alternatives for the proposed anesthesia with the patient or authorized representative who has indicated his/her understanding and acceptance.   Dental advisory given  Plan Discussed with: CRNA and Surgeon  Anesthesia Plan Comments:         Anesthesia Quick Evaluation

## 2017-12-15 NOTE — Op Note (Signed)
Preoperative diagnosis: Left breast cancer status post primary chemotherapy, left breast distortion with benign biopsy Postoperative diagnosis: Same as above Procedure: 1.  Left breast seed guided lumpectomy 2.  Left breast seed guided excisional biopsy of distortion 3.  Injection of blue dye for sentinel lymph node identification 4.  Left deep axillary sentinel lymph node biopsy 5 port removal Surgeon: Dr. Serita Grammes Anesthesia: General with pectoral block Estimated blood loss: 50 cc Specimens: 1.  Left breast seed guided lumpectomy marked with paint 2.  Anterior distortion of left breast 3.  Left deep axillary sentinel lymph nodes with highest count of 546+ blue nodes Complications: None Drains: None Sponge needle count was correct at completion Disposition to recovery stable  Indications: This is 67 year old female was diagnosed with a left breast cancer who is now undergone chemotherapy.  She has a complete imaging response on MRI.  We discussed a seed guided lumpectomy.  She had a discordant area on a prior mammogram that we are also going to excise with a seed guided excisional biopsy.  Oncology is stated she can have her port removed at the same time.  We will proceed with a sentinel lymph node biopsy as well.  Procedure: After informed consent was obtained the patient first underwent a pectoral block.  She was injected with technetium on the left side in the standard periareolar fashion.  She was given antibiotics.  SCDs were in place.  She was then placed under general anesthesia without complication.  Her chest was prepped and draped in the standard sterile surgical fashion.  A surgical timeout was then performed.  I first remove the port in the right side.  I infiltrated Marcaine at her old incision.  I reentered the incision.  I remove the port, line, and suture material in their entirety.  Hemostasis was observed.  I closed this with 3-0 Vicryl, 4-0 Monocryl, and glue.  I  infiltrated a mixture of blue dye and saline underneath the areola.  I massaged this for a minute. I then proceeded to the breast cancer.  Both of the radioactive seeds were in the left upper outer quadrant.  Due to their location as well as 1 of their proximity to the skin I elected to make a curvilinear incision in the upper outer quadrant of the left breast.  I infiltrated local prior to doing this.  I then was able to identify both areas.  I remove the cancer that was posterior first with an attempt to get a clear margin around the seed.  I painted this and passed this off the table.  Mammogram confirmed removal of the seed and the clip that was marking the breast cancer.  I then located the seed anteriorly that was with the distortion.  The seed was very close to the skin and actually removed it separate from the specimen.  I then remove the region around the hair and marked this with paint.  Mammogram confirmed removal of the ribbon clip.  I then obtained hemostasis.  I placed clips in the cavity.  I then closed the breast tissue with 2-0 Vicryl.  The skin was closed with 3-0 Vicryl, 4-0 Monocryl, Dermabond and Steri-Strips.  I then located the sentinel node in the axilla.  I made a low axillary incision right below the hairline and carried this through the axillary fascia.  I then was able to locate a very radioactive node as well as what appeared to be a couple of blue nodes.  These was  excised in their entirety and passed off the table.  I then obtained hemostasis.  There are several small clips in her axilla where there was a small bleeding vessel.  There was no background radioactivity.  I then closed the axillary fascia with a 2-0 Vicryl.  The skin was closed with 3-0 Vicryl and a 4-0 Monocryl.  Dermabond and Steri-Strips were applied.  She tolerated this well was extubated and transferred to recovery stable.

## 2017-12-15 NOTE — Progress Notes (Signed)
Assisted Dr. Rose with left, ultrasound guided, pectoralis block. Side rails up, monitors on throughout procedure. See vital signs in flow sheet. Tolerated Procedure well. 

## 2017-12-15 NOTE — Anesthesia Postprocedure Evaluation (Signed)
Anesthesia Post Note  Patient: Stephanie Bautista  Procedure(s) Performed: LEFT BREAST LUMPECTOMY WITH RADIOACTIVE SEED AND LEFT SENTINEL LYMPH NODE BIOPSY, LEFT BREAST SEED GUIDED EXCISIONAL BIOPSY (Left Breast) REMOVAL PORT-A-CATH (Right Chest)     Patient location during evaluation: PACU Anesthesia Type: General Level of consciousness: awake and alert Pain management: pain level controlled Vital Signs Assessment: post-procedure vital signs reviewed and stable Respiratory status: spontaneous breathing, nonlabored ventilation, respiratory function stable and patient connected to nasal cannula oxygen Cardiovascular status: blood pressure returned to baseline and stable Postop Assessment: no apparent nausea or vomiting Anesthetic complications: no    Last Vitals:  Vitals:   12/15/17 1600 12/15/17 1615  BP: (!) 175/87 (!) 159/89  Pulse: 78 84  Resp: 12 13  Temp: (!) 36.3 C   SpO2: 100% 100%    Last Pain:  Vitals:   12/15/17 1615  TempSrc:   PainSc: 4                  Lonzie Simmer S

## 2017-12-15 NOTE — Discharge Instructions (Signed)
[place] Office Phone Number [phone]  POST OP INSTRUCTIONS Take 400 mg of ibuprofen every 8 hours or tylenol 650 mg every six hours for next 48 hours. Use ice tonight and for next 48 hours.  Always review your discharge instruction sheet given to you by the facility where your surgery was performed.  IF YOU HAVE DISABILITY OR FAMILY LEAVE FORMS, YOU MUST BRING THEM TO THE OFFICE FOR PROCESSING.  DO NOT GIVE THEM TO YOUR DOCTOR.  1. A prescription for pain medication may be given to you upon discharge.  Take your pain medication as prescribed, if needed.  If narcotic pain medicine is not needed, then you may take acetaminophen (Tylenol), naprosyn (Alleve) or ibuprofen (Advil) as needed. 2. Take your usually prescribed medications unless otherwise directed 3. If you need a refill on your pain medication, please contact your pharmacy.  They will contact our office to request authorization.  Prescriptions will not be filled after 5pm or on week-ends. 4. You should eat very light the first 24 hours after surgery, such as soup, crackers, pudding, etc.  Resume your normal diet the day after surgery. 5. Most patients will experience some swelling and bruising in the breast.  Ice packs and a good support bra will help.  Wear the breast binder provided or a sports bra for 72 hours day and night.  After that wear a sports bra during the day until you return to the office. Swelling and bruising can take several days to resolve.  6. It is common to experience some constipation if taking pain medication after surgery.  Increasing fluid intake and taking a stool softener will usually help or prevent this problem from occurring.  A mild laxative (Milk of Magnesia or Miralax) should be taken according to package directions if there are no bowel movements after 48 hours. 7. Unless discharge instructions indicate otherwise, you may remove your bandages 48 hours after surgery and you may shower  at that time.  You may have steri-strips (small skin tapes) in place directly over the incision.  These strips should be left on the skin for 7-10 days and will come off on their own.  If your surgeon used skin glue on the incision, you may shower in 24 hours.  The glue will flake off over the next 2-3 weeks.  Any sutures or staples will be removed at the office during your follow-up visit. 8. ACTIVITIES:  You may resume regular daily activities (gradually increasing) beginning the next day.  Wearing a good support bra or sports bra minimizes pain and swelling.  You may have sexual intercourse when it is comfortable. a. You may drive when you no longer are taking prescription pain medication, you can comfortably wear a seatbelt, and you can safely maneuver your car and apply brakes. b. RETURN TO WORK:  ______________________________________________________________________________________ 9. You should see your doctor in the office for a follow-up appointment approximately two weeks after your surgery.  Your doctors nurse will typically make your follow-up appointment when she calls you with your pathology report.  Expect your pathology report 3-4 business days after your surgery.  You may call to check if you do not hear from [place] after three days. 10. OTHER INSTRUCTIONS: _______________________________________________________________________________________________ _____________________________________________________________________________________________________________________________________ _____________________________________________________________________________________________________________________________________ _____________________________________________________________________________________________________________________________________  WHEN TO CALL DR WAKEFIELD: 1. Fever over 101.0 2. Nausea and/or vomiting. 3. Extreme swelling or bruising. 4. Continued bleeding from  incision. 5. Increased pain, redness, or drainage from the incision.  The clinic staff is available to  answer your questions during regular business hours.  Please dont hesitate to call and ask to speak to one of the nurses for clinical concerns.  If you have a medical emergency, go to the nearest emergency room or call 911.  A surgeon from [facility] is always on call at the hospital.  For further questions, please visit [email or web address]   Post Anesthesia Home Care Instructions  Activity: Get plenty of rest for the remainder of the day. A responsible individual must stay with you for 24 hours following the procedure.  For the next 24 hours, DO NOT: -Drive a car -Paediatric nurse -Drink alcoholic beverages -Take any medication unless instructed by your physician -Make any legal decisions or sign important papers.  Meals: Start with liquid foods such as gelatin or soup. Progress to regular foods as tolerated. Avoid greasy, spicy, heavy foods. If nausea and/or vomiting occur, drink only clear liquids until the nausea and/or vomiting subsides. Call your physician if vomiting continues.  Special Instructions/Symptoms: Your throat may feel dry or sore from the anesthesia or the breathing tube placed in your throat during surgery. If this causes discomfort, gargle with warm salt water. The discomfort should disappear within 24 hours.  If you had a scopolamine patch placed behind your ear for the management of post- operative nausea and/or vomiting:  1. The medication in the patch is effective for 72 hours, after which it should be removed.  Wrap patch in a tissue and discard in the trash. Wash hands thoroughly with soap and water. 2. You may remove the patch earlier than 72 hours if you experience unpleasant side effects which may include dry mouth, dizziness or visual disturbances. 3. Avoid touching the patch. Wash your hands with soap and water after contact  with the patch.

## 2017-12-15 NOTE — Interval H&P Note (Signed)
History and Physical Interval Note:  12/15/2017 1:35 PM  Stephanie Bautista  has presented today for surgery, with the diagnosis of LEFT BREAST CANCER  The various methods of treatment have been discussed with the patient and family. After consideration of risks, benefits and other options for treatment, the patient has consented to  Procedure(s): LEFT BREAST LUMPECTOMY WITH RADIOACTIVE SEED AND LEFT SENTINEL LYMPH NODE BIOPSY, LEFT BREAST SEED GUIDED EXCISIONAL BIOPSY (Left) REMOVAL PORT-A-CATH (N/A) as a surgical intervention .  The patient's history has been reviewed, patient examined, no change in status, stable for surgery.  I have reviewed the patient's chart and labs.  Questions were answered to the patient's satisfaction.     Rolm Bookbinder

## 2017-12-15 NOTE — Anesthesia Procedure Notes (Signed)
Anesthesia Procedure Image    

## 2017-12-15 NOTE — Transfer of Care (Signed)
Immediate Anesthesia Transfer of Care Note  Patient: Cody B Gingrich  Procedure(s) Performed: LEFT BREAST LUMPECTOMY WITH RADIOACTIVE SEED AND LEFT SENTINEL LYMPH NODE BIOPSY, LEFT BREAST SEED GUIDED EXCISIONAL BIOPSY (Left Breast) REMOVAL PORT-A-CATH (Right Chest)  Patient Location: PACU  Anesthesia Type:GA combined with regional for post-op pain  Level of Consciousness: awake, sedated and patient cooperative  Airway & Oxygen Therapy: Patient Spontanous Breathing and Patient connected to face mask oxygen  Post-op Assessment: Report given to RN and Post -op Vital signs reviewed and stable  Post vital signs: Reviewed and stable  Last Vitals:  Vitals Value Taken Time  BP    Temp    Pulse    Resp    SpO2      Last Pain:  Vitals:   12/15/17 1250  TempSrc: Oral         Complications: No apparent anesthesia complications

## 2017-12-15 NOTE — H&P (Signed)
67 yof I saw previously with left breast cancer. she has history of what sounds like right breast dcis (likely er/pr negative) treated with lumpectomy and radiotherapy in 1996. she has sister with ovarian cancer in her 24s. her genetic testing is negative. she had no dc or mass. she had screening mm that shows b density breasts. there is a 1.5 cm mass that has a 6 mm mass next to it and then about 4 cm away there is a distortion seen on [place]. there is also question of abnl node. she had biopsy of node that is negative and concordant. she had biopsy of 1.5 cm mass that is grade III IDC. the 6 mm area has not been biopsied. the biopsy of distortion is c/w fa but this is discordant. this was tn tumor. she has done chemotherapy (stopped early due to side effects). her repeat mri shows complete imaging response and she is doing well today   Past Surgical History Rolm Bookbinder, MD; 12/09/2017 6:33 PM) Breast Biopsy  Left. Breast Mass; Local Excision  Right. Cesarean Section - Multiple  Colon Polyp Removal - Colonoscopy  Gallbladder Surgery - Laparoscopic  Hemorrhoidectomy  Hysterectomy (not due to cancer) - Partial  Shoulder Surgery  Right. Tonsillectomy   Allergies Sabino Gasser; 12/09/2017 4:35 PM) No Known Drug Allergies [07/26/2017]: Allergies Reconciled   Medication History Sabino Gasser; 12/09/2017 4:36 PM) Lorcet (5-325MG  Tablet, Oral) Active. No Current Medications (Taken starting 12/09/2017) LORazepam (0.5MG  Tablet, Oral) Active. TraMADol HCl (50MG  Tablet, Oral) Active. Azithromycin (250MG  Tablet, Oral) Active. Ciprofloxacin HCl (500MG  Tablet, Oral) Active. Clotrimazole (10MG  Troche, Mouth/Throat) Active. Dexamethasone (4MG  Tablet, Oral) Active. Lidocaine-Prilocaine (2.5-2.5% Cream, External) Active. Nystatin (100000 UNIT/ML Suspension, Mouth/Throat) Active. Omeprazole (40MG  Capsule DR, Oral) Active. Promethazine HCl (12.5MG  Tablet, Oral)  Active. Prochlorperazine Maleate (10MG  Tablet, Oral) Active. Cefuroxime Axetil (500MG  Tablet, Oral) Active. Fluconazole (100MG  Tablet, Oral) Active. Ibuprofen (800MG  Tablet, Oral) Active. Lidocaine Viscous (2% Solution, Mouth/Throat) Active. Medications Reconciled  Social History Rolm Bookbinder, MD; 12/09/2017 6:33 PM) Caffeine use  Carbonated beverages. No alcohol use  No drug use  Tobacco use  Never smoker.  Family History Rolm Bookbinder, MD; 12/09/2017 6:34 PM) Alcohol Abuse  Father. Cerebrovascular Accident  Father. Cervical Cancer  Sister. Colon Polyps  Father, Mother. Heart Disease  Mother. Heart disease in female family member before age 38  Hypertension  Father, Mother. Melanoma  Father. Ovarian Cancer  Mother. Thyroid problems  Sister.  Vitals Sabino Gasser; 12/09/2017 4:37 PM) 12/09/2017 4:36 PM Weight: 131 lb Height: 63in Body Surface Area: 1.62 m Body Mass Index: 23.21 kg/m  Temp.: 98.73F(Oral)  Pulse: 75 (Regular)  BP: 140/80 (Sitting, Left Arm, Standard)       Physical Exam Rolm Bookbinder MD; 12/09/2017 6:30 PM) General Mental Status-Alert. Orientation-Oriented X3.  Head and Neck Trachea-midline. Thyroid Gland Characteristics - normal size and consistency.  Eye Sclera/Conjunctiva - Bilateral-No scleral icterus.  Chest and Lung Exam Chest and lung exam reveals -quiet, even and easy respiratory effort with no use of accessory muscles and on auscultation, normal breath sounds, no adventitious sounds and normal vocal resonance.  Breast Nipples-No Discharge. Breast Lump-No Palpable Breast Mass. Note: healed right breast incision, port in place   Cardiovascular Cardiovascular examination reveals -normal heart sounds, regular rate and rhythm with no murmurs.  Abdomen Note: soft nt   Lymphatic Head & Neck  General Head & Neck Lymphatics: Bilateral - Description -  Normal. Axillary  General Axillary Region: Bilateral - Description - Normal. Note: no [place] adenopathy  Assessment & Plan Rolm Bookbinder MD; 12/09/2017 6:33 PM) BREAST CANCER OF UPPER-OUTER QUADRANT OF LEFT FEMALE BREAST (C50.412) Story: Left breast seed guided lumpectomy, left axillary sentinel node biopsy,  remove port We discussed a sentinel lymph node biopsy as she still does not appear to having lymph node involvement right now. We discussed the performance of that with injection of radioactive tracer. We discussed that there is a chance of having a positive node with a sentinel lymph node biopsy and we will await the permanent pathology to make any other first further decisions in terms of her treatment. One of these options might be to return to the operating room to perform an axillary lymph node dissection. We discussed up to a 5% risk lifetime of chronic shoulder pain as well as lymphedema associated with a sentinel lymph node biopsy. We discussed the options for treatment of the breast cancer which included lumpectomy versus a mastectomy. We discussed the performance of the lumpectomy with radioactive seed placement. We discussed a 5% chance of a positive margin requiring reexcision in the operating room. We also discussed that she will need radiation therapy if she undergoes lumpectomy. We discussed the mastectomy and the postoperative care for that as well. Mastectomy can be followed by reconstruction. This is a more extensive surgery and requires more recovery. The decision for lumpectomy vs mastectomy has no impact on decision for chemotherapy. Most mastectomy patients will not need radiation therapy. We discussed that there is no difference in her survival whether she undergoes lumpectomy with radiation therapy or antiestrogen therapy versus a mastectomy. There is also no real difference between her recurrence in the breast. I will also put a a seed at the discordant biopsy to ensure  removal as well. I will discuss port with oncology We discussed the risks of operation including bleeding, infection, possible reoperation. She understands her further therapy will be based on what her stages at the time of her operation.

## 2017-12-16 ENCOUNTER — Encounter (HOSPITAL_BASED_OUTPATIENT_CLINIC_OR_DEPARTMENT_OTHER): Payer: Self-pay | Admitting: General Surgery

## 2017-12-16 NOTE — Anesthesia Procedure Notes (Signed)
Procedure Name: LMA Insertion Date/Time: 12/15/2017 2:48 PM Performed by: Maryella Shivers, CRNA Pre-anesthesia Checklist: Patient identified, Emergency Drugs available, Suction available and Patient being monitored Patient Re-evaluated:Patient Re-evaluated prior to induction Oxygen Delivery Method: Circle system utilized Preoxygenation: Pre-oxygenation with 100% oxygen Induction Type: IV induction Ventilation: Mask ventilation without difficulty LMA: LMA inserted LMA Size: 4.0 Number of attempts: 1 Airway Equipment and Method: Bite block Placement Confirmation: positive ETCO2 Tube secured with: Tape Dental Injury: Teeth and Oropharynx as per pre-operative assessment

## 2017-12-16 NOTE — Addendum Note (Signed)
Addendum  created 12/16/17 1054 by Tawni Millers, CRNA   Charge Capture section accepted

## 2017-12-24 DIAGNOSIS — L7634 Postprocedural seroma of skin and subcutaneous tissue following other procedure: Secondary | ICD-10-CM | POA: Diagnosis not present

## 2017-12-24 DIAGNOSIS — Z9889 Other specified postprocedural states: Secondary | ICD-10-CM | POA: Diagnosis not present

## 2018-01-07 ENCOUNTER — Telehealth: Payer: Self-pay | Admitting: Oncology

## 2018-01-07 NOTE — Telephone Encounter (Signed)
Patient came in said she did not need flush appointment

## 2018-01-07 NOTE — Progress Notes (Signed)
[place]  [phone] Fax:([phone]) [phone]     ID: Lewis B Selvidge DOB: 03/21/1951  MR#: [id number]  RXV#:[id number]  Patient Care Team: Haywood Pao, MD as PCP - General (Internal Medicine) Magrinat, Virgie Dad, MD as Consulting Physician (Oncology) Rolm Bookbinder, MD as Consulting Physician (General Surgery) Maisie Fus, MD as Consulting Physician (Obstetrics and Gynecology) Delrae Rend, MD as Consulting Physician (Endocrinology) Verner Chol, MD as Consulting Physician (Sports Medicine) Lavonia Dana, MD as Referring Physician (Otolaryngology) Eppie Gibson, MD as Attending Physician (Radiation Oncology) OTHER MD:  CHIEF COMPLAINT: Triple negative breast cancer  CURRENT TREATMENT: Adjuvant radiation pending  HISTORY OF CURRENT ILLNESS: From the original intake note:  I saw Alfredo remotely for her history of ductal carcinoma in situ of the right breast, treated with lumpectomy 09/02/1994, with the pathology showing ([id number]) ductal carcinoma in situ.  There was no invasive component.  This was followed by radiation given between 09/21/94 and 11/04/94, with a total of 50932 centigray to the right breast and a total of 604 0 cGy to the tumor bed.  She notes that she didn't take any anti-estrogen therapy at that time.  More recently, Abeni had routine screening mammography at [facility] on 07/16/2017 showing a possible abnormality in the left breast.  The breast density was category B.  She underwent left breast ultrasound on 07/16/2017 showing: a 1.5 cm irregular mass in the left breast upper outer quadrant posterior depth is consistent with carcinoma. A 6 mm irregular mass in the left breast upper outer quadrant posterior depth was also highly suggestive for malignancy. A lymph node in the left axillary tail was read as suspicious of malignancy.   Accordingly on 07/22/2017 she proceeded to biopsy of the left breast area in question as well as  the suspicious left axillary lymph node. The pathology from this procedure showed ([id number]): Lymph node, needle/core biopsy, left axilla with no evidence of carcinoma-- concordant. Breast, left, needle core biopsy, invasive ductal carcinoma, grade 3, prognostic indicators significant for: ER, 0% negative and PR, 0% negative. Proliferation marker Ki67 at 50%. HER2 negative signals ratio is 1.36, and the number per cell 1.70 Breast, left, needle core biopsy, architectural distortion consistent with fibroadenoma--discordant.  Bilateral breast MRI on 07/28/2017 showed breast density category B. There was a 2.4 x 1.4 x 1.2 cm. enhancing mass in the posterior third of the upper-outer quadrant of the left breast corresponding with the biopsy proven invasive ductal carcinoma. No enlarged adenopathy is visualized.  The patient's subsequent history is as detailed below.  INTERVAL HISTORY: Barry returns today for a follow-up and treatment of her triple negative breast cancer accompanied by her husband. She had a post-chemotherapy bilateral breast MRI on 12/04/2017 showing a complete imaging response to neoadjuvant chemotherapy. No residual mass or abnormal enhancement is seen.  She underwent left lumpectomy sentinel lymph node sampling ([id number]) on 12/15/2017 with pathology showing a completed pathologic response with no evidence of malignancy. There was no evidence of malignancy in 3 left axillary sentinel lymph nodes.   She did well from the surgery although she has had to have drainage procedures 2 or 3 times.  The axilla is still a little bit on the touchy side.    REVIEW OF SYSTEMS: Tesa reports that she has not yet met with a radiation doctor. She feels recovered from chemotherapy. For exercise, she walked 6,000 steps so far today. She denies unusual headaches, visual changes, nausea, vomiting, or dizziness. There has been no  unusual cough, phlegm production, or pleurisy. There has been no change in  bowel or bladder habits. She denies unexplained fatigue or unexplained weight loss, bleeding, rash, or fever. A detailed review of systems was otherwise stable.    PAST MEDICAL HISTORY: Past Medical History:  Diagnosis Date  . Acoustic neuroma ([place])   . Adenomatous colon polyp 12/2002  . Arthritis   . Breast cancer ([place]) 1996   right breast cancer in 1996, left breast cancer in 2019  . Cholelithiases   . Esophageal stricture   . Family history of breast cancer   . Family history of ovarian cancer   . Family history of pancreatic cancer   . Family history of prostate cancer   . GERD (gastroesophageal reflux disease)   . Hemorrhoids   . Hiatal hernia   . History of colon polyps   . Inguinal hernia   . UTI (lower urinary tract infection)   As far as PMHx she has had intermittent back pain (spinal stenosis) x 2 years,  Osteoporosis, random migraines, early cataracts and early hear loss due to schwannoma.   PAST SURGICAL HISTORY: Past Surgical History:  Procedure Laterality Date  . BREAST LUMPECTOMY    . BREAST LUMPECTOMY WITH RADIOACTIVE SEED AND SENTINEL LYMPH NODE BIOPSY Left 12/15/2017   Procedure: LEFT BREAST LUMPECTOMY WITH RADIOACTIVE SEED AND LEFT SENTINEL LYMPH NODE BIOPSY, LEFT BREAST SEED GUIDED EXCISIONAL BIOPSY;  Surgeon: Rolm Bookbinder, MD;  Location: [facility];  Service: General;  Laterality: Left;  . CESAREAN SECTION     2x  . CHOLECYSTECTOMY  2008  . HEMORRHOID SURGERY  2007  . INGUINAL HERNIA REPAIR  1968  . PARTIAL HYSTERECTOMY  1996  . PORT-A-CATH REMOVAL Right 12/15/2017   Procedure: REMOVAL PORT-A-CATH;  Surgeon: Rolm Bookbinder, MD;  Location: [facility];  Service: General;  Laterality: Right;  . PORTACATH PLACEMENT Right 08/05/2017   Procedure: INSERTION PORT-A-CATH WITH [place];  Surgeon: Rolm Bookbinder, MD;  Location: [facility];  Service: General;  Laterality: Right;  . TONSILLECTOMY      FAMILY  HISTORY Family History  Problem Relation Age of Onset  . Heart disease Mother   . Melanoma Mother        dx in her 10s  . Ovarian cancer Mother 50  . Heart disease Father   . Diabetes Father   . Stroke Father   . Ovarian cancer Sister 72  . Pancreatic cancer Maternal Aunt   . Bone cancer Maternal Uncle   . Breast cancer Paternal Aunt   . Non-Hodgkin's lymphoma Maternal Grandmother   . Heart disease Maternal Grandfather   . Heart disease Paternal Grandmother   . Prostate cancer Paternal Grandfather   . Stroke Maternal Uncle   . Bone cancer Maternal Uncle   . Cancer Maternal Aunt        NOS  . Breast cancer Cousin        daughter of mat aunt with NOS cancer  Her mother died from CHF at age 105. Her father died from cardiac issues at age 15. She doesn't have any brothers, but has 3 sisters. She had one sister and her mother with ovarian cancer. Her sister was age 42 when she was diagnosed with ovarian cancer and her mother was in her late 70's when she was diagnosed with ovarian cancer. Her sister was not tested genetically following this. Her maternal grandmother had non-hodgkins lymphoma. Her paternal grandfather had prostate cancer. Her paternal aunt had  breast cancer. Her paternal aunt's daughter had breast cancer. She had several uncles that had prostate cancer that spread to the bones.   GYNECOLOGIC HISTORY:  No LMP recorded. Patient has had a hysterectomy. Menarche: 67 years old Age at first live birth: 67 years old [place] P2 LMP: hysterectomy at age 38 S/p Hysterectomy with USO (L)  SOCIAL HISTORY: She has been retired from Medical sales representative work since 05/26/2017. Her husband, Kasandra Knudsen has been retired from the [employer] for the past 9 years. Their son is Leroy Sea is 39 and a IT trainer. Their daughter, Adonis Brook lives in [facility], is 22 and works in child care at [employer]. The patient has 6 grandchildren ( 2 boys and 4 girls). She is a Psychologist, forensic.      ADVANCED  DIRECTIVES:    HEALTH MAINTENANCE: Social History   Tobacco Use  . Smoking status: Never Smoker  . Smokeless tobacco: Never Used  Substance Use Topics  . Alcohol use: No  . Drug use: No     Colonoscopy: Dr. Fuller Plan.   PAP: Dr Nori Riis  Bone density: at Dr. Verlon Au office/ osteoporosis.    Allergies  Allergen Reactions  . Amoxicillin Itching and Rash    Current Outpatient Medications  Medication Sig Dispense Refill  . acetaminophen (TYLENOL) 500 MG tablet Take 500 mg by mouth every 6 (six) hours as needed.    Marland Kitchen alum & mag hydroxide-simeth (MAALOX/MYLANTA) 200-200-20 MG/5ML suspension Take by mouth every 6 (six) hours as needed for indigestion or heartburn.    . baclofen (LIORESAL) 10 MG tablet Take 10 mg by mouth as needed for muscle spasms.    . clotrimazole (MYCELEX) 10 MG troche Take 1 lozenge (10 mg total) by mouth 4 (four) times daily. 56 lozenge 0  . dexamethasone (DECADRON) 4 MG tablet Take 2 tablets by mouth once a day on the day after chemotherapy and then take 2 tablets two times a day for 2 days. Take with food. 30 tablet 1  . lidocaine-prilocaine (EMLA) cream Apply to affected area once 30 g 3  . LORazepam (ATIVAN) 0.5 MG tablet Take 1 tablet (0.5 mg total) by mouth at bedtime as needed (Nausea or vomiting). 30 tablet 0  . nystatin (MYCOSTATIN) 100000 UNIT/ML suspension Take 5 mLs (500,000 Units total) by mouth 4 (four) times daily. 240 mL 1  . omeprazole (PRILOSEC) 40 MG capsule Take 1 capsule (40 mg total) by mouth 2 (two) times daily. 60 capsule 11  . oxyCODONE (OXY IR/ROXICODONE) 5 MG immediate release tablet Take 1 tablet (5 mg total) by mouth every 6 (six) hours as needed for moderate pain, severe pain or breakthrough pain. 10 tablet 0  . prochlorperazine (COMPAZINE) 10 MG tablet Take 1 tablet (10 mg total) by mouth every 6 (six) hours as needed (Nausea or vomiting). 30 tablet 1  . promethazine (PHENERGAN) 12.5 MG tablet 1-2 p.o. every 6 hours as needed for nausea or  headache 45 tablet 1  . traMADol (ULTRAM) 50 MG tablet 1-2 tablets every 6 hours as needed for pain 30 tablet 0   No current facility-administered medications for this visit.     OBJECTIVE: Middle-aged white woman who appears well  Vitals:   01/10/18 1050  BP: 138/68  Pulse: 77  Resp: 18  Temp: 97.9 F (36.6 C)  SpO2: 100%     Body mass index is 22.82 kg/m.   Wt Readings from Last 3 Encounters:  01/10/18 128 lb 12.8 oz (58.4 kg)  12/15/17  128 lb (58.1 kg)  11/30/17 131 lb (59.4 kg)  ECOG FS:1 - Symptomatic but completely ambulatory  Sclerae unicteric, EOMs intact Oropharynx clear and moist No cervical or supraclavicular adenopathy Lungs no rales or rhonchi Heart regular rate and rhythm Abd soft, nontender, positive bowel sounds MSK no focal spinal tenderness, no upper extremity lymphedema Neuro: nonfocal, well oriented, appropriate affect Breasts: Left breast is status post recent lumpectomy.  The incisions are healing very nicely.  There is currently no palpable seroma and no erythema.  The cosmetic result is good.  Both axillae are benign.     LAB RESULTS:  CMP     Component Value Date/Time   NA 141 11/30/2017 0829   K 3.7 11/30/2017 0829   CL 109 11/30/2017 0829   CO2 25 11/30/2017 0829   GLUCOSE 95 11/30/2017 0829   BUN 10 11/30/2017 0829   CREATININE 0.66 11/30/2017 0829   CREATININE 0.81 07/27/2017 1007   CREATININE 0.69 11/16/2011 1054   CALCIUM 8.9 11/30/2017 0829   PROT 6.1 (L) 11/30/2017 0829   ALBUMIN 3.9 11/30/2017 0829   AST 18 11/30/2017 0829   AST 16 07/27/2017 1007   ALT 20 11/30/2017 0829   ALT 12 07/27/2017 1007   ALKPHOS 62 11/30/2017 0829   BILITOT 0.3 11/30/2017 0829   BILITOT 0.6 07/27/2017 1007   GFRNONAA >60 11/30/2017 0829   GFRNONAA >60 07/27/2017 1007   GFRAA >60 11/30/2017 0829   GFRAA >60 07/27/2017 1007    No results found for: TOTALPROTELP, ALBUMINELP, A1GS, A2GS, BETS, BETA2SER, GAMS, MSPIKE, SPEI  No results found  for: KPAFRELGTCHN, LAMBDASER, KAPLAMBRATIO  Lab Results  Component Value Date   WBC 3.1 (L) 01/10/2018   NEUTROABS 1.6 01/10/2018   HGB 12.1 01/10/2018   HCT 36.7 01/10/2018   MCV 92.2 01/10/2018   PLT 139 (L) 01/10/2018    '@LASTCHEMISTRY'$ @  No results found for: LABCA2  No components found for: [id number]  No results for input(s): INR in the last 168 hours.  No results found for: LABCA2  No results found for: YQM578  No results found for: ION629  No results found for: BMW413  No results found for: CA2729  No components found for: HGQUANT  No results found for: CEA1 / No results found for: CEA1   No results found for: AFPTUMOR  No results found for: CHROMOGRNA  No results found for: PSA1  Appointment on 01/10/2018  Component Date Value Ref Range Status  . WBC 01/10/2018 3.1* 3.9 - 10.3 K/uL Final  . RBC 01/10/2018 3.98  3.70 - 5.45 MIL/uL Final  . Hemoglobin 01/10/2018 12.1  11.6 - 15.9 g/dL Final  . HCT 01/10/2018 36.7  34.8 - 46.6 % Final  . MCV 01/10/2018 92.2  79.5 - 101.0 fL Final  . MCH 01/10/2018 30.4  25.1 - 34.0 pg Final  . MCHC 01/10/2018 33.0  31.5 - 36.0 g/dL Final  . RDW 01/10/2018 12.4  11.2 - 14.5 % Final  . Platelets 01/10/2018 139* 145 - 400 K/uL Final  . Neutrophils Relative % 01/10/2018 52  % Final  . Neutro Abs 01/10/2018 1.6  1.5 - 6.5 K/uL Final  . Lymphocytes Relative 01/10/2018 37  % Final  . Lymphs Abs 01/10/2018 1.1  0.9 - 3.3 K/uL Final  . Monocytes Relative 01/10/2018 9  % Final  . Monocytes Absolute 01/10/2018 0.3  0.1 - 0.9 K/uL Final  . Eosinophils Relative 01/10/2018 1  % Final  . Eosinophils Absolute 01/10/2018 0.0  0.0 -  0.5 K/uL Final  . Basophils Relative 01/10/2018 1  % Final  . Basophils Absolute 01/10/2018 0.0  0.0 - 0.1 K/uL Final   Performed at [facility], [address] [place], [place] [address]    (this displays the last labs from the last 3 days)  No results found for: TOTALPROTELP,  ALBUMINELP, A1GS, A2GS, BETS, BETA2SER, GAMS, MSPIKE, SPEI (this displays SPEP labs)  No results found for: KPAFRELGTCHN, LAMBDASER, KAPLAMBRATIO (kappa/lambda light chains)  No results found for: HGBA, HGBA2QUANT, HGBFQUANT, HGBSQUAN (Hemoglobinopathy evaluation)   No results found for: LDH  No results found for: IRON, TIBC, IRONPCTSAT (Iron and TIBC)  No results found for: FERRITIN  Urinalysis    Component Value Date/Time   BILIRUBINUR neg 04/06/2014 1019   PROTEINUR neg 04/06/2014 1019   UROBILINOGEN 0.2 04/06/2014 1019   NITRITE neg 04/06/2014 1019   LEUKOCYTESUR Negative 04/06/2014 1019     STUDIES: Nm Sentinel Node Inj-no Rpt (breast)  Result Date: 12/15/2017 Sulfur colloid was injected by the nuclear medicine technologist for melanoma sentinel node.     ELIGIBLE FOR AVAILABLE RESEARCH PROTOCOL: no  ASSESSMENT: 67 y.o. [place] woman  (1) status post right lumpectomy 09/02/1994 for ductal carcinoma in situ  (a) status post adjuvant radiation 09/21/1994 through 11/04/1994, total 5040 centigrade to the breast and 604 0 cGy to the tumor bed  (b) did not receive adjuvant antiestrogens  (2) status post left breast upper outer quadrant biopsy 07/22/2017 for a clinical T1c pN0, stage IB invasive ductal carcinoma, grade 3, triple negative, with an MIB-1 of 50%.  (a) lymph node biopsy on the same day was negative for malignancy (concordant)  (b) a second area of architectural distortion was also biopsied, read as fibroadenoma (discordant)  (c) a third area of architectural distortion has not yet been biopsied  (d) breast MRI 07/28/2017 shows a clinical T2 N0 tumor  (3) genetics testing 08/02/2017 through the Multi-Gene Panel offered by [facility] found no deleterious mutations in ALK, APC, ATM, AXIN2,BAP1,  BARD1, BLM, BMPR1A, BRCA1, BRCA2, BRIP1, CASR, CDC73, CDH1, CDK4, CDKN1B, CDKN1C, CDKN2A (p14ARF), CDKN2A (p16INK4a), CEBPA, CHEK2, CTNNA1, DICER1, DIS3L2, EGFR  (c.2369C>T, p.Thr790Met variant only), EPCAM (Deletion/duplication testing only), FH, FLCN, GATA2, GPC3, GREM1 (Promoter region deletion/duplication testing only), HOXB13 (c.251G>A, p.Gly84Glu), HRAS, KIT, MAX, MEN1, MET, MITF (c.952G>A, p.Glu318Lys variant only), MLH1, MSH2, MSH3, MSH6, MUTYH, NBN, NF1, NF2, NTHL1, PALB2, PDGFRA, PHOX2B, PMS2, POLD1, POLE, POT1, PRKAR1A, PTCH1, PTEN, RAD50, RAD51C, RAD51D, RB1, RECQL4, RET, RUNX1, SDHAF2, SDHA (sequence changes only), SDHB, SDHC, SDHD, SMAD4, SMARCA4, SMARCB1, SMARCE1, STK11, SUFU, TERT, TERT, TMEM127, TP53, TSC1, TSC2, VHL, WRN and WT1.  The report date is August 02, 2017.  (a) WT1 c.332C>T VUS identified on the Multi-cancer gene panel.    (4) chemotherapy consisting of doxorubicin/ cyclophosphamide in dose dense fashion x4 started 08/24/2017, completed 10/12/2017,  followed by paclitaxel/ carboplatin weekly   (a) carboplatin/paclitaxel discontinued after 3 doses, stopped 11/23/2017, due to cytopenias  (b) post chemo MRI 12/04/2017 findings a complete imaging response  (5) status post left lumpectomy and sentinel lymph node sampling 12/15/2017 showing a complete pathologic response (ypT0, ypN0)  (6) not a candidate for SWOG Y6378 study (post-op pembrolizumab)   (7) adjuvant radiation pending  PLAN: Juletta had the best possible response to her truncated chemotherapy.  She understands that patients like her to obtain a complete pathologic response have a very good long-term prognosis.  Accordingly she does not need any further chemotherapy at this point.  She will  need adjuvant radiation.  We simply do not have data for omitting radiation in patients like this.  I am making a referral to Dr. Isidore Moos and hopefully she can get started within the month.  Arraya will need completion of a dental implant.  I suggested she go ahead and finish the radiation first and she should be able to get on with the dental work sometime in October.  She will return to see  me in November and at that point we will start routine follow-up  She knows to call for any issues that may develop before then.  Magrinat, Virgie Dad, MD  01/10/18 11:15 AM Medical Oncology and Hematology [facility] [address] [place], [place] [address] Tel. [phone]    Fax. [phone]  Alice Rieger, am acting as scribe for Chauncey Cruel MD.  I, Lurline Del MD, have reviewed the above documentation for accuracy and completeness, and I agree with the above.

## 2018-01-10 ENCOUNTER — Inpatient Hospital Stay: Payer: Medicare Other | Attending: Oncology | Admitting: Oncology

## 2018-01-10 ENCOUNTER — Other Ambulatory Visit: Payer: Self-pay | Admitting: Oncology

## 2018-01-10 ENCOUNTER — Inpatient Hospital Stay: Payer: Medicare Other

## 2018-01-10 ENCOUNTER — Telehealth: Payer: Self-pay | Admitting: Oncology

## 2018-01-10 ENCOUNTER — Encounter: Payer: Self-pay | Admitting: Radiation Oncology

## 2018-01-10 VITALS — BP 138/68 | HR 77 | Temp 97.9°F | Resp 18 | Ht 63.0 in | Wt 128.8 lb

## 2018-01-10 DIAGNOSIS — M810 Age-related osteoporosis without current pathological fracture: Secondary | ICD-10-CM | POA: Diagnosis not present

## 2018-01-10 DIAGNOSIS — Z171 Estrogen receptor negative status [ER-]: Principal | ICD-10-CM

## 2018-01-10 DIAGNOSIS — Z86000 Personal history of in-situ neoplasm of breast: Secondary | ICD-10-CM | POA: Insufficient documentation

## 2018-01-10 DIAGNOSIS — C50412 Malignant neoplasm of upper-outer quadrant of left female breast: Secondary | ICD-10-CM

## 2018-01-10 DIAGNOSIS — D0511 Intraductal carcinoma in situ of right breast: Secondary | ICD-10-CM

## 2018-01-10 LAB — CBC WITH DIFFERENTIAL/PLATELET
BASOS ABS: 0 10*3/uL (ref 0.0–0.1)
BASOS PCT: 1 %
Eosinophils Absolute: 0 10*3/uL (ref 0.0–0.5)
Eosinophils Relative: 1 %
HCT: 36.7 % (ref 34.8–46.6)
HEMOGLOBIN: 12.1 g/dL (ref 11.6–15.9)
Lymphocytes Relative: 37 %
Lymphs Abs: 1.1 10*3/uL (ref 0.9–3.3)
MCH: 30.4 pg (ref 25.1–34.0)
MCHC: 33 g/dL (ref 31.5–36.0)
MCV: 92.2 fL (ref 79.5–101.0)
Monocytes Absolute: 0.3 10*3/uL (ref 0.1–0.9)
Monocytes Relative: 9 %
NEUTROS ABS: 1.6 10*3/uL (ref 1.5–6.5)
NEUTROS PCT: 52 %
Platelets: 139 10*3/uL — ABNORMAL LOW (ref 145–400)
RBC: 3.98 MIL/uL (ref 3.70–5.45)
RDW: 12.4 % (ref 11.2–14.5)
WBC: 3.1 10*3/uL — AB (ref 3.9–10.3)

## 2018-01-10 LAB — COMPREHENSIVE METABOLIC PANEL
ALBUMIN: 4.1 g/dL (ref 3.5–5.0)
ALK PHOS: 74 U/L (ref 38–126)
ALT: 15 U/L (ref 0–44)
AST: 18 U/L (ref 15–41)
Anion gap: 9 (ref 5–15)
BUN: 9 mg/dL (ref 8–23)
CO2: 25 mmol/L (ref 22–32)
Calcium: 9 mg/dL (ref 8.9–10.3)
Chloride: 106 mmol/L (ref 98–111)
Creatinine, Ser: 0.75 mg/dL (ref 0.44–1.00)
GFR calc Af Amer: >60 mL/min (ref >60)
GFR calc non Af Amer: >60 mL/min (ref >60)
GLUCOSE: 103 mg/dL — AB (ref 70–99)
POTASSIUM: 3.7 mmol/L (ref 3.5–5.1)
SODIUM: 140 mmol/L (ref 135–145)
TOTAL PROTEIN: 6.7 g/dL (ref 6.5–8.1)
Total Bilirubin: 0.4 mg/dL (ref 0.3–1.2)

## 2018-01-10 NOTE — Telephone Encounter (Signed)
Per 8/19 los.  GM referral for dsquire, radiology.  Gave patient avs and calendar.

## 2018-01-12 NOTE — Progress Notes (Signed)
Location of Breast Cancer: Left Breast  Histology per Pathology Report:  07/22/17 Diagnosis 1. Lymph node, needle/core biopsy, left axilla - THERE IS NO EVIDENCE OF CARCINOMA IN 1 OF 1 LYMPH NODE (0/1). - SEE COMMENT. 2. Breast, left, needle core biopsy, palpable mass - INVASIVE DUCTAL CARCINOMA. - SEE COMMENT. 3. Breast, left, needle core biopsy, architectural distortion - CONSISTENT WITH FIBROADENOMA. - THERE IS NO EVIDENCE OF MALIGNANCY.  Receptor Status: ER(NEG), PR (NEG), Her2-neu (NEG), Ki-(50%)  12/15/17 Diagnosis 1. Breast, lumpectomy, Left - RADIAL SCAR. - HEALING BIOPSY SITE. - THERE IS NO EVIDENCE OF MALIGNANCY. - SEE ONCOLOGY TABLE BELOW. 2. Breast, excision, Left Anterior - RADIAL SCAR(S). - FIBROCYSTIC CHANGES. - USUAL DUCTAL HYPERPLASIA. - HEALING BIOPSY SITE. - THERE IS NO EVIDENCE OF MALIGNANCY. - SEE COMMENT. 3. Lymph node, sentinel, biopsy, Left - THERE IS NO EVIDENCE OF CARCINOMA IN 1 OF 1 LYMPH NODE (0/1). 4. Lymph node, sentinel, biopsy, Left - THERE IS NO EVIDENCE OF CARCINOMA IN 1 OF 1 LYMPH NODE (0/1). 5. Lymph node, sentinel, biopsy, Left - THERE IS NO EVIDENCE OF CARCINOMA IN 1 OF 1 LYMPH NODE (0/1).  Did patient present with symptoms or was this found on screening mammography?: It was discovered on a screening mammogram.   Past/Anticipated interventions by surgeon, if any: 12/15/17 Procedure: 1.  Left breast seed guided lumpectomy 2.  Left breast seed guided excisional biopsy of distortion 3.  Injection of blue dye for sentinel lymph node identification 4.  Left deep axillary sentinel lymph node biopsy 5 port removal Surgeon: Dr. Serita Grammes  Past/Anticipated interventions by medical oncology, if any:  01/10/18 Dr. Jana Hakim PLAN: -Stephanie Bautista had the best possible response to her truncated chemotherapy.  She understands that patients like her to obtain a complete pathologic response have a very good long-term prognosis.  Accordingly she does  not need any further chemotherapy at this point. -She will need adjuvant radiation.  We simply do not have data for omitting radiation in patients like this.  I am making a referral to Dr. Isidore Moos and hopefully she can get started within the month. -[name] will need completion of a dental implant.  I suggested she go ahead and finish the radiation first and she should be able to get on with the dental work sometime in October. -She will return to see me in November and at that point we will start routine follow-up -She knows to call for any issues that may develop before then.  Lymphedema issues, if any:  She denies. She has good arm mobility. She is seeing PT  Pain issues, if any: She reports occasional sharp pain. She has a burning sensation to her left arm.   SAFETY ISSUES:  Prior radiation? Yes,  radiation given between 09/21/94 and 11/04/94, with a [place] of 29562 centigray to the right breast and a total of 604 0 cGy to the tumor bed.    Pacemaker/ICD? No  Possible current pregnancy? No  Is the patient on methotrexate? No  Current Complaints / other details:    BP (!) 155/70 (BP Location: Right Arm, Patient Position: Sitting)   Pulse 71   Temp 98.6 F (37 C) (Oral)   Resp 20   Ht 5' 3" (1.6 m)   Wt 128 lb (58.1 kg)   SpO2 100%   BMI 22.67 kg/m    Wt Readings from Last 3 Encounters:  01/18/18 128 lb (58.1 kg)  01/10/18 128 lb 12.8 oz (58.4 kg)  12/15/17 128 lb (58.1  kg)      [name], Stephani Police, RN 01/12/2018,3:06 PM

## 2018-01-17 ENCOUNTER — Encounter: Payer: Self-pay | Admitting: Rehabilitation

## 2018-01-17 ENCOUNTER — Ambulatory Visit: Payer: Medicare Other | Attending: General Surgery | Admitting: Rehabilitation

## 2018-01-17 ENCOUNTER — Other Ambulatory Visit: Payer: Self-pay

## 2018-01-17 DIAGNOSIS — Z483 Aftercare following surgery for neoplasm: Secondary | ICD-10-CM

## 2018-01-17 DIAGNOSIS — M25612 Stiffness of left shoulder, not elsewhere classified: Secondary | ICD-10-CM | POA: Diagnosis not present

## 2018-01-17 DIAGNOSIS — N644 Mastodynia: Secondary | ICD-10-CM | POA: Diagnosis not present

## 2018-01-17 NOTE — Patient Instructions (Signed)
Access Code: [email or web address]  URL: [email or web address]  Date: 01/17/2018  Prepared by: Shan Levans   Exercises  Hooklying Shoulder T - 3 reps - 1 sets - 60 seconds hold - 1x daily - 7x weekly  Hooklying Shoulder Y - 3 reps - 1 sets - 60 seconds hold - 1x daily - 7x weekly  Also post op exercises supine instead of standing

## 2018-01-17 NOTE — Therapy (Signed)
[place], [place], [address] Phone: [phone]   Fax:  [phone]  Physical Therapy Evaluation  Patient Details  Name: Stephanie Bautista MRN: [id number] Date of Birth: 23-Dec-1950 Referring Provider: Dr. Donne Hazel   Encounter Date: 01/17/2018  PT End of Session - 01/17/18 0940    Visit Number  1    Number of Visits  5    Date for PT Re-Evaluation  02/21/18    PT Start Time  0845    PT Stop Time  0935    PT Time Calculation (min)  50 min    Activity Tolerance  Patient tolerated treatment well    Behavior During Therapy  [facility] for tasks assessed/performed       Past Medical History:  Diagnosis Date  . Acoustic neuroma ([place])   . Adenomatous colon polyp 12/2002  . Arthritis   . Breast cancer ([place]) 1996   right breast cancer in 1996, left breast cancer in 2019  . Cholelithiases   . Esophageal stricture   . Family history of breast cancer   . Family history of ovarian cancer   . Family history of pancreatic cancer   . Family history of prostate cancer   . GERD (gastroesophageal reflux disease)   . Hemorrhoids   . Hiatal hernia   . History of colon polyps   . Inguinal hernia   . UTI (lower urinary tract infection)     Past Surgical History:  Procedure Laterality Date  . BREAST LUMPECTOMY    . BREAST LUMPECTOMY WITH RADIOACTIVE SEED AND SENTINEL LYMPH NODE BIOPSY Left 12/15/2017   Procedure: LEFT BREAST LUMPECTOMY WITH RADIOACTIVE SEED AND LEFT SENTINEL LYMPH NODE BIOPSY, LEFT BREAST SEED GUIDED EXCISIONAL BIOPSY;  Surgeon: Rolm Bookbinder, MD;  Location: [place];  Service: General;  Laterality: Left;  . CESAREAN SECTION     2x  . CHOLECYSTECTOMY  2008  . HEMORRHOID SURGERY  2007  . INGUINAL HERNIA REPAIR  1968  . PARTIAL HYSTERECTOMY  1996  . PORT-A-CATH REMOVAL Right 12/15/2017   Procedure: REMOVAL PORT-A-CATH;  Surgeon: Rolm Bookbinder, MD;  Location: [place];  Service: General;  Laterality: Right;  . PORTACATH PLACEMENT Right 08/05/2017   Procedure: INSERTION PORT-A-CATH WITH [place];  Surgeon: Rolm Bookbinder, MD;  Location: [place];  Service: General;  Laterality: Right;  . TONSILLECTOMY      There were no vitals filed for this visit.   Subjective Assessment - 01/17/18 0840    Subjective  I just want to be okay for radiation.  It may be a bit swollen but not too bad     Pertinent History  history of ductal carcinoma in situ of the right breast, treated with lumpectomy 09/02/1994 and radiation, currently status post left lumpectomy and sentinel lymph node sampling 12/15/2017 showing a complete pathologic response (ypT0, ypN0) to neoadjuvant chemotherapy. Aspiration of the Lt breast and axilla x 3 (70, 60, 60cc).  Will be having radiation soon.      Patient Stated Goals  get ready for radiation     Currently in Pain?  Yes    Pain Score  5    0-10    Pain Location  Breast   and axilla    Pain Orientation  Left    Pain Descriptors / Indicators  Aching;Tightness;Burning    Pain Type  Surgical pain    Pain Onset  More than a month ago  Pain Frequency  Intermittent    Aggravating Factors   just intermittent pain now; and need for support     Pain Relieving Factors  support, bra          [facility] PT Assessment - 01/17/18 0001      Assessment   Medical Diagnosis  Lt triple negative breast cancer    Referring Provider  Dr. Donne Hazel    Onset Date/Surgical Date  12/15/17    Hand Dominance  Right    Next MD Visit  tomorrow    Prior Therapy  no      Precautions   Precaution Comments  cancer      Restrictions   Weight Bearing Restrictions  No      Balance Screen   Has the patient fallen in the past 6 months  No    Has the patient had a decrease in activity level because of a fear of falling?   No    Is the patient reluctant to leave their home because of a fear of falling?   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 slightly sunken in nipple which pt says is from tissue removal       Coordination   Gross Motor Movements are Fluid and Coordinated  Yes      Posture/Postural Control   Posture/Postural Control  No significant limitations      ROM / Strength   AROM / PROM / Strength  AROM;PROM;Strength      AROM   Overall AROM Comments  bad Rt shoulder due to surgery     AROM Assessment Site  Shoulder    Right/Left Shoulder  Right;Left    Right Shoulder Flexion  150 Degrees    Right Shoulder ABduction  150 Degrees    Right Shoulder Internal Rotation  70 Degrees    Right Shoulder External Rotation  90 Degrees    Right Shoulder Horizontal ABduction  35 Degrees    Left Shoulder Flexion  155 Degrees    Left Shoulder ABduction  160 Degrees    Left Shoulder Internal Rotation  70 Degrees    Left Shoulder External Rotation  90 Degrees    Left Shoulder Horizontal ABduction  20 Degrees      PROM   Overall PROM Comments  Flex: 130 with pull, Abd; 140 with pull, ER to 90, radiation position ok with some guarding         LYMPHEDEMA/ONCOLOGY QUESTIONNAIRE - 01/17/18 0842      Type   Cancer Type  Lt triple negative breast cancer      Surgeries   Lumpectomy Date  12/15/17   Lt, 09/02/1994 Rt   Sentinel Lymph Node Biopsy Date  12/15/17    Number Lymph Nodes Removed  2   0/2      Treatment   Active Chemotherapy Treatment  No    Past Chemotherapy Treatment  Yes    Date  10/12/17    Active Radiation Treatment  No   will be starting soon on the Lt  Past Radiation Treatment  Yes    Date  --   in 1996 to Rt side   Current Hormone  Treatment  No    Past Hormone Therapy  No      What other symptoms do you have   Are you Having Heaviness or Tightness  No      Lymphedema Assessments   Lymphedema Assessments  Upper extremities      Right Upper Extremity Lymphedema   10 cm Proximal to Olecranon Process  26.9 cm    Olecranon Process  24.3 cm    10 cm Proximal to Ulnar Styloid Process  18.8 cm    Just Proximal to Ulnar Styloid Process  15.3 cm    Across Hand at [employer]  18.8 cm    At [place] of 2nd Digit  6.1 cm      Left Upper Extremity Lymphedema   10 cm Proximal to Olecranon Process  25 cm    Olecranon Process  24 cm    10 cm Proximal to Ulnar Styloid Process  17.8 cm    Just Proximal to Ulnar Styloid Process  15.5 cm    Across Hand at [employer]  17.2 cm    At [place] of 2nd Digit  6.2 cm             Objective measurements completed on examination: See above findings.              PT Education - 01/17/18 0939    Education Details  new HEP, lymphedema precautions and signs, [employer] and cancer center options    Person(s) Educated  Patient    Methods  Explanation;Demonstration;Verbal cues;Handout    Comprehension  Verbalized understanding;Returned demonstration          PT Long Term Goals - 01/17/18 0946      PT LONG TERM GOAL #1   Title  pt will improve Lt shoulder PROM to 150 flexion and abduction     Time  5    Period  Weeks    Status  New    Target Date  02/21/18      PT LONG TERM GOAL #2   Title  Pt will be educated on HEP for tolerance to radiation position    Time  5    Period  Weeks    Status  New    Target Date  02/21/18      PT LONG TERM GOAL #3   Title  Pt will be educated on strength ABC program     Time  5    Period  Weeks    Status  New    Target Date  02/21/18             Plan - 01/17/18 6160    Clinical Impression Statement  Pt presents today with concerns about radiation position tolerance of the Rt shoulder.  Upon examination she presents  with end range limitations in the last 10 degrees for flexion and abduction and more so limited in supine with PROM of around 130-140.  She has some apprehension and guarding in the radiation position today.  She was educated on stretches to perform daily until radiation begins and instructed to return if she would like assistance.  She would like to try things indepndently for now but may return after radiation for strength education.      History and Personal Factors relevant to plan of care:  history of ductal carcinoma in  situ of the right breast, treated with lumpectomy 09/02/1994 and radiation, currently status post left lumpectomy and sentinel lymph node sampling 12/15/2017 showing a complete pathologic response (ypT0, ypN0) to neoadjuvant chemotherapy. Will be having radiation soon.      Clinical Presentation  Evolving    Clinical Presentation due to:  surgical status    Clinical Decision Making  Moderate    Rehab Potential  Excellent    PT Frequency  --   return if neeed    PT Treatment/Interventions  ADLs/Self Care Home Management;Therapeutic exercise;Patient/family education;Manual techniques;Passive range of motion    PT Next Visit Plan  if returns Rt shoulder ROM work, strength testing for strength education     PT Home Exercise Plan  medbridge access code; Access Code: [email or web address], also post op exercises supine    Consulted and Agree with Plan of Care  Patient       Patient will benefit from skilled therapeutic intervention in order to improve the following deficits and impairments:  Pain, Decreased activity tolerance, Decreased range of motion, Impaired UE functional use  Visit Diagnosis: Aftercare following surgery for neoplasm  Stiffness of left shoulder, not elsewhere classified  Breast pain     Problem List Patient Active Problem List   Diagnosis Date Noted  . Chemotherapy induced neutropenia ([place]) 10/05/2017  . Thrush, oral 10/05/2017  . Port-A-Cath in place  08/23/2017  . Genetic testing 08/03/2017  . Family history of breast cancer 07/27/2017  . Ductal carcinoma in situ (DCIS) of right breast 07/27/2017  . Schwannoma of cranial nerve ([place]) 07/27/2017  . Family history of prostate cancer   . Family history of ovarian cancer   . Family history of pancreatic cancer   . History of colon polyps   . Tinnitus of left ear 08/12/2011  . Nailbed injury 08/12/2011  . Sensorineural hearing loss, unilateral 08/12/2011  . Malignant neoplasm of upper-outer quadrant of left breast in female, estrogen receptor negative ([place]) 10/07/2007  . ADENOMATOUS COLONIC POLYP 10/07/2007  . HEMORRHOIDS 10/07/2007  . GERD 10/07/2007  . GASTRITIS 10/07/2007  . INGUINAL HERNIA 10/07/2007  . CHOLELITHIASIS 10/07/2007  . ARTHRITIS 10/07/2007    Shan Levans, PT 01/17/2018, 9:48 AM  [place], [place], [address] Phone: [phone]   Fax:  [phone]  Name: Stephanie Bautista MRN: [id number] Date of Birth: [birth year]

## 2018-01-18 ENCOUNTER — Other Ambulatory Visit: Payer: Self-pay

## 2018-01-18 ENCOUNTER — Encounter: Payer: Self-pay | Admitting: Radiation Oncology

## 2018-01-18 ENCOUNTER — Ambulatory Visit
Admission: RE | Admit: 2018-01-18 | Discharge: 2018-01-18 | Disposition: A | Discharge status: Home / Self Care | Payer: Medicare Other | Source: Ambulatory Visit | Attending: Radiation Oncology | Admitting: Radiation Oncology

## 2018-01-18 DIAGNOSIS — Z803 Family history of malignant neoplasm of breast: Secondary | ICD-10-CM | POA: Diagnosis not present

## 2018-01-18 DIAGNOSIS — Z9221 Personal history of antineoplastic chemotherapy: Secondary | ICD-10-CM | POA: Diagnosis not present

## 2018-01-18 DIAGNOSIS — C50412 Malignant neoplasm of upper-outer quadrant of left female breast: Secondary | ICD-10-CM

## 2018-01-18 DIAGNOSIS — K5909 Other constipation: Secondary | ICD-10-CM | POA: Diagnosis not present

## 2018-01-18 DIAGNOSIS — Z9889 Other specified postprocedural states: Secondary | ICD-10-CM | POA: Diagnosis not present

## 2018-01-18 DIAGNOSIS — Z880 Allergy status to penicillin: Secondary | ICD-10-CM | POA: Insufficient documentation

## 2018-01-18 DIAGNOSIS — M1990 Unspecified osteoarthritis, unspecified site: Secondary | ICD-10-CM | POA: Diagnosis not present

## 2018-01-18 DIAGNOSIS — Z171 Estrogen receptor negative status [ER-]: Secondary | ICD-10-CM | POA: Insufficient documentation

## 2018-01-18 DIAGNOSIS — Z923 Personal history of irradiation: Secondary | ICD-10-CM | POA: Diagnosis not present

## 2018-01-18 DIAGNOSIS — Z79899 Other long term (current) drug therapy: Secondary | ICD-10-CM | POA: Insufficient documentation

## 2018-01-18 DIAGNOSIS — Z86000 Personal history of in-situ neoplasm of breast: Secondary | ICD-10-CM | POA: Diagnosis not present

## 2018-01-18 NOTE — Progress Notes (Addendum)
Radiation Oncology         [phone]) [phone] ________________________________  Initial Outpatient Consultation  Name: Stephanie Bautista MRN: [id number]  Date: 01/18/2018  DOB: 01-May-1951  CW:CBJSEGB, Fransico Him, MD  Magrinat, Virgie Dad, MD   REFERRING PHYSICIAN: Magrinat, Virgie Dad, MD  DIAGNOSIS:    ICD-10-CM   1. Malignant neoplasm of upper-outer quadrant of left breast in female, estrogen receptor negative ([place]) C50.412    Z17.1    Cancer Staging Malignant neoplasm of upper-outer quadrant of left breast in female, estrogen receptor negative ([place]) Staging form: Breast, AJCC 8th Edition - Clinical: Stage IB (cT1c, cN0, cM0, G3, ER: Negative, PR: Negative, HER2: Negative) - Unsigned  ypT0N0 Left Breast UOQ Invasive Ductal Carcinoma, ER(-) / PR(-) / Her2(-), Grade 3  CHIEF COMPLAINT: Here to discuss management of left breast cancer  HISTORY OF PRESENT ILLNESS::Stephanie Bautista is a 67 y.o. female with remote history of ductal carcinoma in situ of the right breast, treated with lumpectomy on 09/02/1994, with the pathology showing ductal carcinoma in situ.  There was no invasive component.  This was followed by radiation given from 09/21/1994 to 11/04/1994, with a total of 5040 centigray to the right breast and a total of 6040 cGy to the tumor bed.  She notes that she didn't take any anti-estrogen therapy at that time.  She presented with breast abnormality on the following imaging: Screening mammography on the date of 07/16/2017.  Ultrasound of breast on 07/16/2017 revealed a 1.5 cm irregular mass in the left breast upper outer quadrant posterior depth consistent with carcinoma. A 6 mm irregular mass in the left breast upper outer quadrant posterior depth was highly suggestive for malignancy. A lymph node in the left axillary tail was also read as suspicious for malignancy.  Biopsy on date of 07/22/2017 showed invasive ductal carcinoma.  ER status: negative; PR status negative, Her2 status negative;  Grade 3. No evidence of carcinoma in the suspicious left axillary lymph node.  Bilateral breast MRI from 07/28/2017 showed a 2.4 cm enhancing mass in the posterior third of the UOQ of the left breast corresponding with the biopsy-proven invasive ductal carcinoma.  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  She started neoadjuvant chemotherapy on 08/24/2017 consisting of doxorubicin/cyclophosphamide in dose dense fashion x4, completed 10/12/2017, followed by paclitaxel/carboplatin weekly. Carboplatin/paclitaxel was discontinued after 3 doses on 11/23/2017 due to cytopenias.  Post-chemo MRI on 12/04/2017 showed a complete imaging response to neoadjuvant chemotherapy. No residual mass or abnormal enhancement seen. She does not need any further chemotherapy at this point.  The patient underwent left breast lumpectomy with sentinel lymph node biopsy on 12/15/2017. Pathology revealed a complete pathologic response with no evidence of malignancy. ER negative, PR negative, Her2 negative. Margins negative. No evidence of carcinoma in 3 lymph nodes.   On review of systems, the patient reports occasional post-operative sharp pains in her left breast. She has a burning sensation to her left arm. She did see PT yesterday and is doing exercises at home. She reports insomnia since surgery. She reports chronic constipation. She reports having a "kidney infection" last week that has since resolved. She reports she will need completion of a dental implant.  PREVIOUS RADIATION THERAPY: Yes - 7 weeks of radiation given from 09/21/1994 to 11/04/1994, with a total of 5040 centigray to the right breast and a total of 6040 cGy to the tumor bed.  PAST MEDICAL HISTORY:  has a past medical history of Acoustic neuroma (  [place]), Adenomatous colon polyp (12/2002), Arthritis, Breast cancer ([place]) (1996), Cholelithiases, Esophageal stricture, Family history of breast cancer, Family history of ovarian  cancer, Family history of pancreatic cancer, Family history of prostate cancer, GERD (gastroesophageal reflux disease), Hemorrhoids, Hiatal hernia, History of colon polyps, Inguinal hernia, and UTI (lower urinary tract infection).    PAST SURGICAL HISTORY: Past Surgical History:  Procedure Laterality Date  . BREAST LUMPECTOMY    . BREAST LUMPECTOMY WITH RADIOACTIVE SEED AND SENTINEL LYMPH NODE BIOPSY Left 12/15/2017   Procedure: LEFT BREAST LUMPECTOMY WITH RADIOACTIVE SEED AND LEFT SENTINEL LYMPH NODE BIOPSY, LEFT BREAST SEED GUIDED EXCISIONAL BIOPSY;  Surgeon: Rolm Bookbinder, MD;  Location: [place];  Service: General;  Laterality: Left;  . CESAREAN SECTION     2x  . CHOLECYSTECTOMY  2008  . HEMORRHOID SURGERY  2007  . INGUINAL HERNIA REPAIR  1968  . PARTIAL HYSTERECTOMY  1996  . PORT-A-CATH REMOVAL Right 12/15/2017   Procedure: REMOVAL PORT-A-CATH;  Surgeon: Rolm Bookbinder, MD;  Location: [facility];  Service: General;  Laterality: Right;  . PORTACATH PLACEMENT Right 08/05/2017   Procedure: INSERTION PORT-A-CATH WITH [place];  Surgeon: Rolm Bookbinder, MD;  Location: [place];  Service: General;  Laterality: Right;  . SHOULDER SURGERY Right 2005   torn muscle  . TONSILLECTOMY      FAMILY HISTORY: family history includes Bone cancer in her maternal uncle and maternal uncle; Breast cancer in her cousin and paternal aunt; Cancer in her maternal aunt; Diabetes in her father; Heart disease in her father, maternal grandfather, mother, and paternal grandmother; Melanoma in her mother; Non-Hodgkin's lymphoma in her maternal grandmother; Ovarian cancer (age of onset: 42) in her mother; Ovarian cancer (age of onset: 24) in her sister; Pancreatic cancer in her maternal aunt; Prostate cancer in her paternal grandfather; Stroke in her father and maternal uncle.  SOCIAL HISTORY:  reports that she has never smoked. She has never used smokeless tobacco.  She reports that she does not drink alcohol or use drugs.  ALLERGIES: Amoxicillin  MEDICATIONS:  Current Outpatient Medications  Medication Sig Dispense Refill  . acetaminophen (TYLENOL) 500 MG tablet Take 500 mg by mouth every 6 (six) hours as needed.    Marland Kitchen alum & mag hydroxide-simeth (MAALOX/MYLANTA) 200-200-20 MG/5ML suspension Take by mouth every 6 (six) hours as needed for indigestion or heartburn.    Marland Kitchen omeprazole (PRILOSEC) 40 MG capsule Take 1 capsule (40 mg total) by mouth 2 (two) times daily. 60 capsule 11  . baclofen (LIORESAL) 10 MG tablet Take 10 mg by mouth as needed for muscle spasms.    . clotrimazole (MYCELEX) 10 MG troche Take 1 lozenge (10 mg total) by mouth 4 (four) times daily. (Patient not taking: Reported on 01/18/2018) 56 lozenge 0  . dexamethasone (DECADRON) 4 MG tablet Take 2 tablets by mouth once a day on the day after chemotherapy and then take 2 tablets two times a day for 2 days. Take with food. (Patient not taking: Reported on 01/18/2018) 30 tablet 1  . LORazepam (ATIVAN) 0.5 MG tablet Take 1 tablet (0.5 mg total) by mouth at bedtime as needed (Nausea or vomiting). (Patient not taking: Reported on 01/18/2018) 30 tablet 0  . nystatin (MYCOSTATIN) 100000 UNIT/ML suspension Take 5 mLs (500,000 Units total) by mouth 4 (four) times daily. (Patient not taking: Reported on 01/18/2018) 240 mL 1  . oxyCODONE (OXY IR/ROXICODONE) 5 MG immediate release tablet Take 1 tablet (5 mg total) by mouth every  6 (six) hours as needed for moderate pain, severe pain or breakthrough pain. (Patient not taking: Reported on 01/18/2018) 10 tablet 0  . prochlorperazine (COMPAZINE) 10 MG tablet Take 1 tablet (10 mg total) by mouth every 6 (six) hours as needed (Nausea or vomiting). (Patient not taking: Reported on 01/18/2018) 30 tablet 1  . promethazine (PHENERGAN) 12.5 MG tablet 1-2 p.o. every 6 hours as needed for nausea or headache (Patient not taking: Reported on 01/18/2018) 45 tablet 1  . traMADol  (ULTRAM) 50 MG tablet 1-2 tablets every 6 hours as needed for pain (Patient not taking: Reported on 01/18/2018) 30 tablet 0   No current facility-administered medications for this encounter.     REVIEW OF SYSTEMS: A 10+ POINT REVIEW OF SYSTEMS WAS OBTAINED including neurology, dermatology, psychiatry, cardiac, respiratory, lymph, extremities, GI, GU, Musculoskeletal, constitutional, breasts, reproductive, HEENT.  All pertinent positives are noted in the HPI.  All others are negative.   PHYSICAL EXAM:  height is '5\' 3"'$  (1.6 m) and weight is 128 lb (58.1 kg). Her oral temperature is 98.6 F (37 C). Her blood pressure is 155/70 (abnormal) and her pulse is 71. Her respiration is 20 and oxygen saturation is 100%.   General: Alert and oriented, in no acute distress. HEENT: Head is normocephalic. Extraocular movements are intact.  Neck: Neck is supple, no palpable cervical or supraclavicular lymphadenopathy. Heart: Regular in rate and rhythm with no murmurs, rubs, or gallops. Chest: Clear to auscultation bilaterally, with no rhonchi, wheezes, or rales. Abdomen: Soft, nontender, nondistended, with no rigidity or guarding. Extremities: No edema. Lymphatics: see Neck Exam Skin: No concerning lesions. She does have some old tattoos on her chest from prior radiation. Musculoskeletal: Symmetric strength and muscle tone throughout. Neurologic: Cranial nerves II through XII are grossly intact. No obvious focalities. Speech is fluent. Coordination is intact. Psychiatric: Judgment and insight are intact. Affect is appropriate. Breasts: Her Port-A-Cath site in the right upper chest is healing well status post removal. Right breast without any palpable masses. She has a 12:00 lumpectomy scar in the right breast. In the left breast she has a 2:00 lumpectomy scar and axillary scar that have both healed nicely.     ECOG = 0  0 - Asymptomatic (Fully active, able to carry on all predisease activities without  restriction)  1 - Symptomatic but completely ambulatory (Restricted in physically strenuous activity but ambulatory and able to carry out work of a light or sedentary nature. For example, light housework, office work)  2 - Symptomatic, <50% in bed during the day (Ambulatory and capable of all self care but unable to carry out any work activities. Up and about more than 50% of waking hours)  3 - Symptomatic, >50% in bed, but not bedbound (Capable of only limited self-care, confined to bed or chair 50% or more of waking hours)  4 - Bedbound (Completely disabled. Cannot carry on any self-care. Totally confined to bed or chair)  5 - Death   Eustace Pen MM, Creech RH, Tormey DC, et al. [phone]). "Toxicity and response criteria of the [facility]". [place] Oncol. 5 (6): 649-55   LABORATORY DATA:  Lab Results  Component Value Date   WBC 3.1 (L) 01/10/2018   HGB 12.1 01/10/2018   HCT 36.7 01/10/2018   MCV 92.2 01/10/2018   PLT 139 (L) 01/10/2018   CMP     Component Value Date/Time   NA 140 01/10/2018 1036   K 3.7 01/10/2018 1036  CL 106 01/10/2018 1036   CO2 25 01/10/2018 1036   GLUCOSE 103 (H) 01/10/2018 1036   BUN 9 01/10/2018 1036   CREATININE 0.75 01/10/2018 1036   CREATININE 0.81 07/27/2017 1007   CREATININE 0.69 11/16/2011 1054   CALCIUM 9.0 01/10/2018 1036   PROT 6.7 01/10/2018 1036   ALBUMIN 4.1 01/10/2018 1036   AST 18 01/10/2018 1036   AST 16 07/27/2017 1007   ALT 15 01/10/2018 1036   ALT 12 07/27/2017 1007   ALKPHOS 74 01/10/2018 1036   BILITOT 0.4 01/10/2018 1036   BILITOT 0.6 07/27/2017 1007   GFRNONAA >60 01/10/2018 1036   GFRNONAA >60 07/27/2017 1007   GFRAA >60 01/10/2018 1036   GFRAA >60 07/27/2017 1007        RADIOGRAPHY: as above    IMPRESSION/PLAN: Left Breast Cancer  It was a pleasure meeting the patient today. We discussed the risks, benefits, and side effects of radiotherapy. I recommend radiotherapy to the left breast to  reduce her risk of locoregional recurrence by 2/3.  We discussed that radiation would take approximately 4 weeks to complete and that I would give the patient a few more weeks to heal from surgery before starting treatment planning. We spoke about acute effects including skin irritation and fatigue as well as much less common late effects including internal organ injury or irritation. We spoke about the latest technology that is used to minimize the risk of late effects for patients undergoing radiotherapy to the breast or chest wall. No guarantees of treatment were given. The patient is enthusiastic about proceeding with treatment. I look forward to participating in the patient's care.  We discussed and signed the radiation oncology consent form, and a copy was retained for our records.  CT simulation/treatment planning is scheduled for 01/31/18 at 9:00 AM with treatment to begin the week of September 16th.  Regarding the patient's dental implant surgery, she is going to delay this until after completion of radiotherapy.  __________________________________________   Eppie Gibson, MD  This document serves as a record of services personally performed by Eppie Gibson, MD. It was created on her behalf by Rae Lips, a trained medical scribe. The creation of this record is based on the scribe's personal observations and the provider's statements to them. This document has been checked and approved by the attending provider.

## 2018-01-31 ENCOUNTER — Ambulatory Visit
Admission: RE | Admit: 2018-01-31 | Discharge: 2018-01-31 | Disposition: A | Discharge status: Home / Self Care | Payer: Medicare Other | Source: Ambulatory Visit | Attending: Radiation Oncology | Admitting: Radiation Oncology

## 2018-01-31 DIAGNOSIS — C50412 Malignant neoplasm of upper-outer quadrant of left female breast: Secondary | ICD-10-CM | POA: Diagnosis not present

## 2018-01-31 DIAGNOSIS — Z171 Estrogen receptor negative status [ER-]: Secondary | ICD-10-CM | POA: Diagnosis not present

## 2018-01-31 DIAGNOSIS — Z510 Encounter for antineoplastic radiation therapy: Secondary | ICD-10-CM | POA: Diagnosis not present

## 2018-01-31 NOTE — Addendum Note (Signed)
Encounter addended by: Eppie Gibson, MD on: 01/31/2018 12:19 PM  Actions taken: Sign clinical note

## 2018-01-31 NOTE — Progress Notes (Signed)
  Radiation Oncology         [phone]) [phone] ________________________________  Name: Stephanie Bautista MRN: [id number]  Date: 01/31/2018  DOB: Dec 09, 1950  SIMULATION AND TREATMENT PLANNING NOTE    Outpatient  DIAGNOSIS:     ICD-10-CM   1. Malignant neoplasm of upper-outer quadrant of left breast in female, estrogen receptor negative ([place]) C50.412    Z17.1     NARRATIVE:  The patient was brought to the [facility].  Identity was confirmed.  All relevant records and images related to the planned course of therapy were reviewed.  The patient freely provided informed written consent to proceed with treatment after reviewing the details related to the planned course of therapy. The consent form was witnessed and verified by the simulation staff.    Then, the patient was set-up in a stable reproducible supine position for radiation therapy with her ipsilateral arm over her head, and her upper body secured in a custom-made Vac-lok device.  CT images were obtained.  Surface markings were placed.  The CT images were loaded into the planning software.    TREATMENT PLANNING NOTE: Treatment planning then occurred.  The radiation prescription was entered and confirmed.     A total of 3 medically necessary complex treatment devices were fabricated and supervised by me: 2 fields with MLCs for custom blocks to protect heart, and lungs;  and, a Vac-lok. MORE COMPLEX DEVICES MAY BE MADE IN DOSIMETRY FOR FIELD IN FIELD BEAMS FOR DOSE HOMOGENEITY.  I have requested : 3D Simulation which is medically necessary to give adequate dose to at risk tissues while sparing lungs and heart.  I have requested a DVH of the following structures: lungs, heart, left lumpectomy cavity.    The patient will receive 40.05 Gy in 15 fractions to the left breast with 2 tangential fields.   This will be followed by a boost.  Optical Surface Tracking Plan:  Since intensity modulated radiotherapy (IMRT) and 3D conformal  radiation treatment methods are predicated on accurate and precise positioning for treatment, intrafraction motion monitoring is medically necessary to ensure accurate and safe treatment delivery. The ability to quantify intrafraction motion without excessive ionizing radiation dose can only be performed with optical surface tracking. Accordingly, surface imaging offers the opportunity to obtain 3D measurements of patient position throughout IMRT and 3D treatments without excessive radiation exposure. I am ordering optical surface tracking for this patient's upcoming course of radiotherapy.  ________________________________   Reference:  Ursula Alert, J, et al. Surface imaging-based analysis of intrafraction motion for breast radiotherapy patients.Journal of [place], n. 6, nov. 2014. ISSN [id number].  Available at: <http://www.jacmp.org/index.php/jacmp/article/view/4957>.    -----------------------------------  Eppie Gibson, MD

## 2018-02-01 DIAGNOSIS — Z510 Encounter for antineoplastic radiation therapy: Secondary | ICD-10-CM | POA: Diagnosis not present

## 2018-02-01 DIAGNOSIS — C50412 Malignant neoplasm of upper-outer quadrant of left female breast: Secondary | ICD-10-CM | POA: Diagnosis not present

## 2018-02-01 DIAGNOSIS — Z171 Estrogen receptor negative status [ER-]: Secondary | ICD-10-CM | POA: Diagnosis not present

## 2018-02-02 ENCOUNTER — Encounter: Payer: Self-pay | Admitting: *Deleted

## 2018-02-04 ENCOUNTER — Ambulatory Visit: Payer: Medicare Other | Admitting: Radiation Oncology

## 2018-02-07 ENCOUNTER — Ambulatory Visit
Admission: RE | Admit: 2018-02-07 | Discharge: 2018-02-07 | Disposition: A | Discharge status: Home / Self Care | Payer: Medicare Other | Source: Ambulatory Visit | Attending: Radiation Oncology | Admitting: Radiation Oncology

## 2018-02-07 DIAGNOSIS — Z510 Encounter for antineoplastic radiation therapy: Secondary | ICD-10-CM | POA: Diagnosis not present

## 2018-02-07 DIAGNOSIS — Z171 Estrogen receptor negative status [ER-]: Secondary | ICD-10-CM | POA: Diagnosis not present

## 2018-02-07 DIAGNOSIS — C50412 Malignant neoplasm of upper-outer quadrant of left female breast: Secondary | ICD-10-CM

## 2018-02-07 MED ORDER — RADIAPLEXRX EX GEL
Freq: Once | CUTANEOUS | Status: AC
  Administered 2018-02-07: 14:00:00 via TOPICAL

## 2018-02-07 MED ORDER — ALRA NON-METALLIC DEODORANT (RAD-ONC)
1.0000 "application " | Freq: Once | TOPICAL | Status: AC
  Administered 2018-02-07: 1 via TOPICAL

## 2018-02-07 NOTE — Progress Notes (Signed)

## 2018-02-08 ENCOUNTER — Ambulatory Visit
Admission: RE | Admit: 2018-02-08 | Discharge: 2018-02-08 | Disposition: A | Discharge status: Home / Self Care | Payer: Medicare Other | Source: Ambulatory Visit | Attending: Radiation Oncology | Admitting: Radiation Oncology

## 2018-02-08 DIAGNOSIS — Z510 Encounter for antineoplastic radiation therapy: Secondary | ICD-10-CM | POA: Diagnosis not present

## 2018-02-08 DIAGNOSIS — Z171 Estrogen receptor negative status [ER-]: Secondary | ICD-10-CM | POA: Diagnosis not present

## 2018-02-08 DIAGNOSIS — C50412 Malignant neoplasm of upper-outer quadrant of left female breast: Secondary | ICD-10-CM | POA: Diagnosis not present

## 2018-02-09 ENCOUNTER — Ambulatory Visit
Admission: RE | Admit: 2018-02-09 | Discharge: 2018-02-09 | Disposition: A | Discharge status: Home / Self Care | Payer: Medicare Other | Source: Ambulatory Visit | Attending: Radiation Oncology | Admitting: Radiation Oncology

## 2018-02-09 DIAGNOSIS — Z510 Encounter for antineoplastic radiation therapy: Secondary | ICD-10-CM | POA: Diagnosis not present

## 2018-02-09 DIAGNOSIS — Z171 Estrogen receptor negative status [ER-]: Secondary | ICD-10-CM | POA: Diagnosis not present

## 2018-02-09 DIAGNOSIS — C50412 Malignant neoplasm of upper-outer quadrant of left female breast: Secondary | ICD-10-CM | POA: Diagnosis not present

## 2018-02-10 ENCOUNTER — Ambulatory Visit
Admission: RE | Admit: 2018-02-10 | Discharge: 2018-02-10 | Disposition: A | Discharge status: Home / Self Care | Payer: Medicare Other | Source: Ambulatory Visit | Attending: Radiation Oncology | Admitting: Radiation Oncology

## 2018-02-10 DIAGNOSIS — Z171 Estrogen receptor negative status [ER-]: Secondary | ICD-10-CM | POA: Diagnosis not present

## 2018-02-10 DIAGNOSIS — Z510 Encounter for antineoplastic radiation therapy: Secondary | ICD-10-CM | POA: Diagnosis not present

## 2018-02-10 DIAGNOSIS — C50412 Malignant neoplasm of upper-outer quadrant of left female breast: Secondary | ICD-10-CM | POA: Diagnosis not present

## 2018-02-11 ENCOUNTER — Ambulatory Visit
Admission: RE | Admit: 2018-02-11 | Discharge: 2018-02-11 | Disposition: A | Discharge status: Home / Self Care | Payer: Medicare Other | Source: Ambulatory Visit | Attending: Radiation Oncology | Admitting: Radiation Oncology

## 2018-02-11 DIAGNOSIS — C50412 Malignant neoplasm of upper-outer quadrant of left female breast: Secondary | ICD-10-CM | POA: Diagnosis not present

## 2018-02-11 DIAGNOSIS — Z510 Encounter for antineoplastic radiation therapy: Secondary | ICD-10-CM | POA: Diagnosis not present

## 2018-02-11 DIAGNOSIS — Z171 Estrogen receptor negative status [ER-]: Secondary | ICD-10-CM | POA: Diagnosis not present

## 2018-02-14 ENCOUNTER — Ambulatory Visit
Admission: RE | Admit: 2018-02-14 | Discharge: 2018-02-14 | Disposition: A | Discharge status: Home / Self Care | Payer: Medicare Other | Source: Ambulatory Visit | Attending: Radiation Oncology | Admitting: Radiation Oncology

## 2018-02-14 DIAGNOSIS — Z510 Encounter for antineoplastic radiation therapy: Secondary | ICD-10-CM | POA: Diagnosis not present

## 2018-02-14 DIAGNOSIS — C50412 Malignant neoplasm of upper-outer quadrant of left female breast: Secondary | ICD-10-CM | POA: Diagnosis not present

## 2018-02-14 DIAGNOSIS — Z171 Estrogen receptor negative status [ER-]: Secondary | ICD-10-CM | POA: Diagnosis not present

## 2018-02-15 ENCOUNTER — Ambulatory Visit
Admission: RE | Admit: 2018-02-15 | Discharge: 2018-02-15 | Disposition: A | Discharge status: Home / Self Care | Payer: Medicare Other | Source: Ambulatory Visit | Attending: Radiation Oncology | Admitting: Radiation Oncology

## 2018-02-15 DIAGNOSIS — Z171 Estrogen receptor negative status [ER-]: Secondary | ICD-10-CM | POA: Diagnosis not present

## 2018-02-15 DIAGNOSIS — Z510 Encounter for antineoplastic radiation therapy: Secondary | ICD-10-CM | POA: Diagnosis not present

## 2018-02-15 DIAGNOSIS — C50412 Malignant neoplasm of upper-outer quadrant of left female breast: Secondary | ICD-10-CM | POA: Diagnosis not present

## 2018-02-16 ENCOUNTER — Ambulatory Visit
Admission: RE | Admit: 2018-02-16 | Discharge: 2018-02-16 | Disposition: A | Discharge status: Home / Self Care | Payer: Medicare Other | Source: Ambulatory Visit | Attending: Radiation Oncology | Admitting: Radiation Oncology

## 2018-02-16 DIAGNOSIS — Z171 Estrogen receptor negative status [ER-]: Secondary | ICD-10-CM | POA: Diagnosis not present

## 2018-02-16 DIAGNOSIS — C50412 Malignant neoplasm of upper-outer quadrant of left female breast: Secondary | ICD-10-CM | POA: Diagnosis not present

## 2018-02-16 DIAGNOSIS — Z510 Encounter for antineoplastic radiation therapy: Secondary | ICD-10-CM | POA: Diagnosis not present

## 2018-02-17 ENCOUNTER — Ambulatory Visit
Admission: RE | Admit: 2018-02-17 | Discharge: 2018-02-17 | Disposition: A | Discharge status: Home / Self Care | Payer: Medicare Other | Source: Ambulatory Visit | Attending: Radiation Oncology | Admitting: Radiation Oncology

## 2018-02-17 DIAGNOSIS — Z510 Encounter for antineoplastic radiation therapy: Secondary | ICD-10-CM | POA: Diagnosis not present

## 2018-02-17 DIAGNOSIS — Z171 Estrogen receptor negative status [ER-]: Secondary | ICD-10-CM | POA: Diagnosis not present

## 2018-02-17 DIAGNOSIS — C50412 Malignant neoplasm of upper-outer quadrant of left female breast: Secondary | ICD-10-CM | POA: Diagnosis not present

## 2018-02-18 ENCOUNTER — Ambulatory Visit
Admission: RE | Admit: 2018-02-18 | Discharge: 2018-02-18 | Disposition: A | Discharge status: Home / Self Care | Payer: Medicare Other | Source: Ambulatory Visit | Attending: Radiation Oncology | Admitting: Radiation Oncology

## 2018-02-18 DIAGNOSIS — Z171 Estrogen receptor negative status [ER-]: Secondary | ICD-10-CM | POA: Diagnosis not present

## 2018-02-18 DIAGNOSIS — Z510 Encounter for antineoplastic radiation therapy: Secondary | ICD-10-CM | POA: Diagnosis not present

## 2018-02-18 DIAGNOSIS — C50412 Malignant neoplasm of upper-outer quadrant of left female breast: Secondary | ICD-10-CM | POA: Diagnosis not present

## 2018-02-21 ENCOUNTER — Ambulatory Visit
Admission: RE | Admit: 2018-02-21 | Discharge: 2018-02-21 | Disposition: A | Discharge status: Home / Self Care | Payer: Medicare Other | Source: Ambulatory Visit | Attending: Radiation Oncology | Admitting: Radiation Oncology

## 2018-02-21 DIAGNOSIS — C50412 Malignant neoplasm of upper-outer quadrant of left female breast: Secondary | ICD-10-CM | POA: Diagnosis not present

## 2018-02-21 DIAGNOSIS — Z171 Estrogen receptor negative status [ER-]: Secondary | ICD-10-CM | POA: Diagnosis not present

## 2018-02-21 DIAGNOSIS — Z510 Encounter for antineoplastic radiation therapy: Secondary | ICD-10-CM | POA: Diagnosis not present

## 2018-02-22 ENCOUNTER — Ambulatory Visit
Admission: RE | Admit: 2018-02-22 | Discharge: 2018-02-22 | Disposition: A | Discharge status: Home / Self Care | Payer: Medicare Other | Source: Ambulatory Visit | Attending: Radiation Oncology | Admitting: Radiation Oncology

## 2018-02-22 DIAGNOSIS — C50412 Malignant neoplasm of upper-outer quadrant of left female breast: Secondary | ICD-10-CM | POA: Diagnosis not present

## 2018-02-22 DIAGNOSIS — Z171 Estrogen receptor negative status [ER-]: Secondary | ICD-10-CM | POA: Diagnosis not present

## 2018-02-23 ENCOUNTER — Ambulatory Visit
Admission: RE | Admit: 2018-02-23 | Discharge: 2018-02-23 | Disposition: A | Discharge status: Home / Self Care | Payer: Medicare Other | Source: Ambulatory Visit | Attending: Radiation Oncology | Admitting: Radiation Oncology

## 2018-02-23 DIAGNOSIS — C50412 Malignant neoplasm of upper-outer quadrant of left female breast: Secondary | ICD-10-CM | POA: Diagnosis not present

## 2018-02-23 DIAGNOSIS — Z171 Estrogen receptor negative status [ER-]: Secondary | ICD-10-CM | POA: Diagnosis not present

## 2018-02-24 ENCOUNTER — Ambulatory Visit
Admission: RE | Admit: 2018-02-24 | Discharge: 2018-02-24 | Disposition: A | Discharge status: Home / Self Care | Payer: Medicare Other | Source: Ambulatory Visit | Attending: Radiation Oncology | Admitting: Radiation Oncology

## 2018-02-24 DIAGNOSIS — C50412 Malignant neoplasm of upper-outer quadrant of left female breast: Secondary | ICD-10-CM | POA: Diagnosis not present

## 2018-02-24 DIAGNOSIS — Z171 Estrogen receptor negative status [ER-]: Secondary | ICD-10-CM | POA: Diagnosis not present

## 2018-02-25 ENCOUNTER — Ambulatory Visit
Admission: RE | Admit: 2018-02-25 | Discharge: 2018-02-25 | Disposition: A | Discharge status: Home / Self Care | Payer: Medicare Other | Source: Ambulatory Visit | Attending: Radiation Oncology | Admitting: Radiation Oncology

## 2018-02-25 DIAGNOSIS — C50412 Malignant neoplasm of upper-outer quadrant of left female breast: Secondary | ICD-10-CM | POA: Diagnosis not present

## 2018-02-25 DIAGNOSIS — Z171 Estrogen receptor negative status [ER-]: Secondary | ICD-10-CM | POA: Diagnosis not present

## 2018-02-28 ENCOUNTER — Ambulatory Visit
Admission: RE | Admit: 2018-02-28 | Discharge: 2018-02-28 | Disposition: A | Discharge status: Home / Self Care | Payer: Medicare Other | Source: Ambulatory Visit | Attending: Radiation Oncology | Admitting: Radiation Oncology

## 2018-02-28 DIAGNOSIS — C50412 Malignant neoplasm of upper-outer quadrant of left female breast: Secondary | ICD-10-CM

## 2018-02-28 DIAGNOSIS — Z171 Estrogen receptor negative status [ER-]: Secondary | ICD-10-CM | POA: Diagnosis not present

## 2018-02-28 MED ORDER — RADIAPLEXRX EX GEL
Freq: Once | CUTANEOUS | Status: AC
  Administered 2018-02-28: 11:00:00 via TOPICAL

## 2018-03-01 ENCOUNTER — Ambulatory Visit
Admission: RE | Admit: 2018-03-01 | Discharge: 2018-03-01 | Disposition: A | Discharge status: Home / Self Care | Payer: Medicare Other | Source: Ambulatory Visit | Attending: Radiation Oncology | Admitting: Radiation Oncology

## 2018-03-01 DIAGNOSIS — Z171 Estrogen receptor negative status [ER-]: Secondary | ICD-10-CM | POA: Diagnosis not present

## 2018-03-01 DIAGNOSIS — C50412 Malignant neoplasm of upper-outer quadrant of left female breast: Secondary | ICD-10-CM | POA: Diagnosis not present

## 2018-03-02 ENCOUNTER — Ambulatory Visit
Admission: RE | Admit: 2018-03-02 | Discharge: 2018-03-02 | Disposition: A | Discharge status: Home / Self Care | Payer: Medicare Other | Source: Ambulatory Visit | Attending: Radiation Oncology | Admitting: Radiation Oncology

## 2018-03-02 DIAGNOSIS — C50412 Malignant neoplasm of upper-outer quadrant of left female breast: Secondary | ICD-10-CM | POA: Diagnosis not present

## 2018-03-02 DIAGNOSIS — Z171 Estrogen receptor negative status [ER-]: Secondary | ICD-10-CM | POA: Diagnosis not present

## 2018-03-03 ENCOUNTER — Ambulatory Visit
Admission: RE | Admit: 2018-03-03 | Discharge: 2018-03-03 | Disposition: A | Discharge status: Home / Self Care | Payer: Medicare Other | Source: Ambulatory Visit | Attending: Radiation Oncology | Admitting: Radiation Oncology

## 2018-03-03 DIAGNOSIS — Z171 Estrogen receptor negative status [ER-]: Secondary | ICD-10-CM | POA: Diagnosis not present

## 2018-03-03 DIAGNOSIS — C50412 Malignant neoplasm of upper-outer quadrant of left female breast: Secondary | ICD-10-CM | POA: Diagnosis not present

## 2018-03-04 ENCOUNTER — Encounter: Payer: Self-pay | Admitting: Radiation Oncology

## 2018-03-04 ENCOUNTER — Ambulatory Visit
Admission: RE | Admit: 2018-03-04 | Discharge: 2018-03-04 | Disposition: A | Discharge status: Home / Self Care | Payer: Medicare Other | Source: Ambulatory Visit | Attending: Radiation Oncology | Admitting: Radiation Oncology

## 2018-03-04 DIAGNOSIS — C50412 Malignant neoplasm of upper-outer quadrant of left female breast: Secondary | ICD-10-CM | POA: Diagnosis not present

## 2018-03-04 DIAGNOSIS — Z171 Estrogen receptor negative status [ER-]: Secondary | ICD-10-CM | POA: Diagnosis not present

## 2018-03-08 NOTE — Progress Notes (Signed)
  Radiation Oncology         [phone]) [phone] ________________________________  Name: Stephanie Bautista MRN: [id number]  Date: 03/04/2018  DOB: 11-Nov-1950  End of Treatment Note  Diagnosis:   67 y.o. female with [id number] Left Breast UOQ Invasive Ductal Carcinoma, ER(-) / PR(-) / Her2(-), Grade 3    Indication for treatment:  Curative       Radiation treatment dates:   02/07/2018 - 03/04/2018  Site/dose:    1. Left Breast / 40.05 Gy in 15 fractions 2. Left Breast Boost / 10 Gy in 5 fractions  Beams/energy:    1. 3D / 6X, 10X Photon 2. 3D / 15E Electron  Narrative: The patient tolerated radiation treatment relatively well.  She experienced some expected skin irritation as she progressed through treatment with erythema and dermatitis present in the UIQ and IM fold. She is applying Radiaplex to these areas and hydrocortisone cream for itching. She also reported breast tenderness.  Plan: The patient has completed radiation treatment. The patient will return to radiation oncology clinic for routine followup in one month. I advised them to call or return sooner if they have any questions or concerns related to their recovery or treatment.  -----------------------------------  Eppie Gibson, MD  This document serves as a record of services personally performed by Eppie Gibson, MD. It was created on her behalf by Rae Lips, a trained medical scribe. The creation of this record is based on the scribe's personal observations and the provider's statements to them. This document has been checked and approved by the attending provider.

## 2018-03-29 ENCOUNTER — Encounter: Payer: Self-pay | Admitting: Adult Health

## 2018-03-29 ENCOUNTER — Telehealth: Payer: Self-pay | Admitting: Adult Health

## 2018-03-29 ENCOUNTER — Inpatient Hospital Stay: Payer: Medicare Other | Attending: Oncology

## 2018-03-29 ENCOUNTER — Inpatient Hospital Stay (HOSPITAL_BASED_OUTPATIENT_CLINIC_OR_DEPARTMENT_OTHER): Payer: Medicare Other | Admitting: Adult Health

## 2018-03-29 ENCOUNTER — Encounter: Payer: Self-pay | Admitting: *Deleted

## 2018-03-29 VITALS — BP 153/78 | HR 65 | Temp 97.9°F | Resp 18

## 2018-03-29 DIAGNOSIS — Z86000 Personal history of in-situ neoplasm of breast: Secondary | ICD-10-CM | POA: Diagnosis not present

## 2018-03-29 DIAGNOSIS — D0511 Intraductal carcinoma in situ of right breast: Secondary | ICD-10-CM

## 2018-03-29 DIAGNOSIS — Z808 Family history of malignant neoplasm of other organs or systems: Secondary | ICD-10-CM | POA: Diagnosis not present

## 2018-03-29 DIAGNOSIS — N644 Mastodynia: Secondary | ICD-10-CM

## 2018-03-29 DIAGNOSIS — D485 Neoplasm of uncertain behavior of skin: Secondary | ICD-10-CM | POA: Diagnosis not present

## 2018-03-29 DIAGNOSIS — D2261 Melanocytic nevi of right upper limb, including shoulder: Secondary | ICD-10-CM | POA: Diagnosis not present

## 2018-03-29 DIAGNOSIS — M810 Age-related osteoporosis without current pathological fracture: Secondary | ICD-10-CM | POA: Insufficient documentation

## 2018-03-29 DIAGNOSIS — R5383 Other fatigue: Secondary | ICD-10-CM

## 2018-03-29 DIAGNOSIS — Z79899 Other long term (current) drug therapy: Secondary | ICD-10-CM | POA: Insufficient documentation

## 2018-03-29 DIAGNOSIS — D225 Melanocytic nevi of trunk: Secondary | ICD-10-CM | POA: Diagnosis not present

## 2018-03-29 DIAGNOSIS — Z23 Encounter for immunization: Secondary | ICD-10-CM | POA: Diagnosis not present

## 2018-03-29 DIAGNOSIS — L589 Radiodermatitis, unspecified: Secondary | ICD-10-CM | POA: Diagnosis not present

## 2018-03-29 DIAGNOSIS — D224 Melanocytic nevi of scalp and neck: Secondary | ICD-10-CM | POA: Diagnosis not present

## 2018-03-29 DIAGNOSIS — Z171 Estrogen receptor negative status [ER-]: Secondary | ICD-10-CM | POA: Insufficient documentation

## 2018-03-29 DIAGNOSIS — C50412 Malignant neoplasm of upper-outer quadrant of left female breast: Secondary | ICD-10-CM | POA: Insufficient documentation

## 2018-03-29 DIAGNOSIS — L570 Actinic keratosis: Secondary | ICD-10-CM | POA: Diagnosis not present

## 2018-03-29 DIAGNOSIS — L820 Inflamed seborrheic keratosis: Secondary | ICD-10-CM | POA: Diagnosis not present

## 2018-03-29 DIAGNOSIS — D2221 Melanocytic nevi of right ear and external auricular canal: Secondary | ICD-10-CM | POA: Diagnosis not present

## 2018-03-29 DIAGNOSIS — Z86018 Personal history of other benign neoplasm: Secondary | ICD-10-CM | POA: Diagnosis not present

## 2018-03-29 LAB — CBC WITH DIFFERENTIAL/PLATELET
ABS IMMATURE GRANULOCYTES: 0.01 10*3/uL (ref 0.00–0.07)
BASOS PCT: 1 %
Basophils Absolute: 0 10*3/uL (ref 0.0–0.1)
EOS ABS: 0.1 10*3/uL (ref 0.0–0.5)
Eosinophils Relative: 1 %
HCT: 40.1 % (ref 36.0–46.0)
Hemoglobin: 12.7 g/dL (ref 12.0–15.0)
Immature Granulocytes: 0 %
Lymphocytes Relative: 24 %
Lymphs Abs: 0.9 10*3/uL (ref 0.7–4.0)
MCH: 28.2 pg (ref 26.0–34.0)
MCHC: 31.7 g/dL (ref 30.0–36.0)
MCV: 88.9 fL (ref 80.0–100.0)
MONO ABS: 0.5 10*3/uL (ref 0.1–1.0)
MONOS PCT: 11 %
Neutro Abs: 2.5 10*3/uL (ref 1.7–7.7)
Neutrophils Relative %: 63 %
PLATELETS: 169 10*3/uL (ref 150–400)
RBC: 4.51 MIL/uL (ref 3.87–5.11)
RDW: 12.9 % (ref 11.5–15.5)
WBC: 3.9 10*3/uL — ABNORMAL LOW (ref 4.0–10.5)
nRBC: 0 % (ref 0.0–0.2)

## 2018-03-29 LAB — COMPREHENSIVE METABOLIC PANEL
ALBUMIN: 3.9 g/dL (ref 3.5–5.0)
ALK PHOS: 87 U/L (ref 38–126)
ALT: 10 U/L (ref 0–44)
AST: 16 U/L (ref 15–41)
Anion gap: 8 (ref 5–15)
BILIRUBIN TOTAL: 0.4 mg/dL (ref 0.3–1.2)
BUN: 10 mg/dL (ref 8–23)
CALCIUM: 9.6 mg/dL (ref 8.9–10.3)
CO2: 27 mmol/L (ref 22–32)
Chloride: 107 mmol/L (ref 98–111)
Creatinine, Ser: 0.84 mg/dL (ref 0.44–1.00)
GFR calc Af Amer: >60 mL/min (ref >60)
GFR calc non Af Amer: >60 mL/min (ref >60)
GLUCOSE: 73 mg/dL (ref 70–99)
Potassium: 3.8 mmol/L (ref 3.5–5.1)
Sodium: 142 mmol/L (ref 135–145)
TOTAL PROTEIN: 7.2 g/dL (ref 6.5–8.1)

## 2018-03-29 MED ORDER — GABAPENTIN 100 MG PO CAPS: 300.0000 mg | ORAL_CAPSULE | Freq: Every day | ORAL | 0 refills | Status: DC

## 2018-03-29 NOTE — Patient Instructions (Signed)
Gabapentin capsules or tablets What is this medicine? GABAPENTIN (GA ba pen tin) is used to control partial seizures in adults with epilepsy. It is also used to treat certain types of nerve pain. This medicine may be used for other purposes; ask your health care provider or pharmacist if you have questions. COMMON BRAND NAME(S): Active-PAC with Gabapentin, Gabarone, Neurontin What should I tell my health care provider before I take this medicine? They need to know if you have any of these conditions: -kidney disease -suicidal thoughts, plans, or attempt; a previous suicide attempt by you or a family member -an unusual or allergic reaction to gabapentin, other medicines, foods, dyes, or preservatives -pregnant or trying to get pregnant -breast-feeding How should I use this medicine? Take this medicine by mouth with a glass of water. Follow the directions on the prescription label. You can take it with or without food. If it upsets your stomach, take it with food.Take your medicine at regular intervals. Do not take it more often than directed. Do not stop taking except on your doctor's advice. If you are directed to break the 600 or 800 mg tablets in half as part of your dose, the extra half tablet should be used for the next dose. If you have not used the extra half tablet within 28 days, it should be thrown away. A special MedGuide will be given to you by the pharmacist with each prescription and refill. Be sure to read this information carefully each time. Talk to your pediatrician regarding the use of this medicine in children. Special care may be needed. Overdosage: If you think you have taken too much of this medicine contact a poison control center or emergency room at once. NOTE: This medicine is only for you. Do not share this medicine with others. What if I miss a dose? If you miss a dose, take it as soon as you can. If it is almost time for your next dose, take only that dose. Do not  take double or extra doses. What may interact with this medicine? Do not take this medicine with any of the following medications: -other gabapentin products This medicine may also interact with the following medications: -alcohol -antacids -antihistamines for allergy, cough and cold -certain medicines for anxiety or sleep -certain medicines for depression or psychotic disturbances -homatropine; hydrocodone -naproxen -narcotic medicines (opiates) for pain -phenothiazines like chlorpromazine, mesoridazine, prochlorperazine, thioridazine This list may not describe all possible interactions. Give your health care provider a list of all the medicines, herbs, non-prescription drugs, or dietary supplements you use. Also tell them if you smoke, drink alcohol, or use illegal drugs. Some items may interact with your medicine. What should I watch for while using this medicine? Visit your doctor or health care professional for regular checks on your progress. You may want to keep a record at home of how you feel your condition is responding to treatment. You may want to share this information with your doctor or health care professional at each visit. You should contact your doctor or health care professional if your seizures get worse or if you have any new types of seizures. Do not stop taking this medicine or any of your seizure medicines unless instructed by your doctor or health care professional. Stopping your medicine suddenly can increase your seizures or their severity. Wear a medical identification bracelet or chain if you are taking this medicine for seizures, and carry a card that lists all your medications. You may get drowsy, dizzy,   or have blurred vision. Do not drive, use machinery, or do anything that needs mental alertness until you know how this medicine affects you. To reduce dizzy or fainting spells, do not sit or stand up quickly, especially if you are an older patient. Alcohol can  increase drowsiness and dizziness. Avoid alcoholic drinks. Your mouth may get dry. Chewing sugarless gum or sucking hard candy, and drinking plenty of water will help. The use of this medicine may increase the chance of suicidal thoughts or actions. Pay special attention to how you are responding while on this medicine. Any worsening of mood, or thoughts of suicide or dying should be reported to your health care professional right away. Women who become pregnant while using this medicine may enroll in the [place] Antiepileptic Drug Pregnancy Registry by calling 1-888-233-2334. This registry collects information about the safety of antiepileptic drug use during pregnancy. What side effects may I notice from receiving this medicine? Side effects that you should report to your doctor or health care professional as soon as possible: -allergic reactions like skin rash, itching or hives, swelling of the face, lips, or tongue -worsening of mood, thoughts or actions of suicide or dying Side effects that usually do not require medical attention (report to your doctor or health care professional if they continue or are bothersome): -constipation -difficulty walking or controlling muscle movements -dizziness -nausea -slurred speech -tiredness -tremors -weight gain This list may not describe all possible side effects. Call your doctor for medical advice about side effects. You may report side effects to FDA at 1-800-FDA-1088. Where should I keep my medicine? Keep out of reach of children. This medicine may cause accidental overdose and death if it taken by other adults, children, or pets. Mix any unused medicine with a substance like cat litter or coffee grounds. Then throw the medicine away in a sealed container like a sealed bag or a coffee can with a lid. Do not use the medicine after the expiration date. Store at room temperature between 15 and 30 degrees C (59 and 86 degrees F). NOTE: This  sheet is a summary. It may not cover all possible information. If you have questions about this medicine, talk to your doctor, pharmacist, or health care provider.  2018 [employer]vier/Gold Standard (2013-07-07 15:26:50)  

## 2018-03-29 NOTE — Telephone Encounter (Signed)
Gave pt avs and calendar. Pt sched in feb due to avail of provider sched

## 2018-03-29 NOTE — Progress Notes (Signed)
[place]  [phone] [phone]     ID: Stephanie Bautista DOB: 1950/12/09  MR#: [id number]  SWN#:[id number]  Patient Care Team: Haywood Pao, MD as PCP - General (Internal Medicine) Magrinat, Virgie Dad, MD as Consulting Physician (Oncology) Rolm Bookbinder, MD as Consulting Physician (General Surgery) Maisie Fus, MD as Consulting Physician (Obstetrics and Gynecology) Delrae Rend, MD as Consulting Physician (Endocrinology) Verner Chol, MD as Consulting Physician (Sports Medicine) Lavonia Dana, MD as Referring Physician (Otolaryngology) Eppie Gibson, MD as Attending Physician (Radiation Oncology) OTHER MD:  CHIEF COMPLAINT: Triple negative breast cancer  CURRENT TREATMENT: observation  HISTORY OF CURRENT ILLNESS: From the original intake note:  I saw Stephanie Bautista remotely for her history of ductal carcinoma in situ of the right breast, treated with lumpectomy 09/02/1994, with the pathology showing ([id number]) ductal carcinoma in situ.  There was no invasive component.  This was followed by radiation given between 09/21/94 and 11/04/94, with a total of 93818 centigray to the right breast and a total of 604 0 cGy to the tumor bed.  She notes that she didn't take any anti-estrogen therapy at that time.  More recently, Stephanie Bautista had routine screening mammography at [facility] on 07/16/2017 showing a possible abnormality in the left breast.  The breast density was category B.  She underwent left breast ultrasound on 07/16/2017 showing: a 1.5 cm irregular mass in the left breast upper outer quadrant posterior depth is consistent with carcinoma. A 6 mm irregular mass in the left breast upper outer quadrant posterior depth was also highly suggestive for malignancy. A lymph node in the left axillary tail was read as suspicious of malignancy.   Accordingly on 07/22/2017 she proceeded to biopsy of the left breast area in question as well as the suspicious  left axillary lymph node. The pathology from this procedure showed ([id number]): Lymph node, needle/core biopsy, left axilla with no evidence of carcinoma-- concordant. Breast, left, needle core biopsy, invasive ductal carcinoma, grade 3, prognostic indicators significant for: ER, 0% negative and PR, 0% negative. Proliferation marker Ki67 at 50%. HER2 negative signals ratio is 1.36, and the number per cell 1.70 Breast, left, needle core biopsy, architectural distortion consistent with fibroadenoma--discordant.  Bilateral breast MRI on 07/28/2017 showed breast density category B. There was a 2.4 x 1.4 x 1.2 cm. enhancing mass in the posterior third of the upper-outer quadrant of the left breast corresponding with the biopsy proven invasive ductal carcinoma. No enlarged adenopathy is visualized.  The patient's subsequent history is as detailed below.  INTERVAL HISTORY: Stephanie Bautista is here today accompanied by her husband for f/u of her triple negative breast cancer.  She is doing well.  She notes some pain since undergoing radiation that is located in her breast.  She describes this as a striking, almost electrical pain.  She notes it will hit, and feels like several shocks at one time, then will stop.  It happens about four times per day.  She says it is worse at night, and sometimes will keep her up.      REVIEW OF SYSTEMS: Stephanie Bautista is staying active and exercising. She is slightly fatigued, but does continue to enjoy her regular activities.  She also notes some left underarm discomfort that has been present since her surgery.  She denies any other issues today.  She is without unusual headaches or vision changes.  She isn't having any fevers, chills, chest pain, palpitations, cough or shortness of breath.  She isn't experiencing  any nausea, vomiting, constipation, diarrhea, abdominal concerns.  A detailed ROS was non contributory.     PAST MEDICAL HISTORY: Past Medical History:  Diagnosis Date  . Acoustic  neuroma ([place])   . Adenomatous colon polyp 12/2002  . Arthritis   . Breast cancer ([place]) 1996   right breast cancer in 1996, left breast cancer in 2019  . Cholelithiases   . Esophageal stricture   . Family history of breast cancer   . Family history of ovarian cancer   . Family history of pancreatic cancer   . Family history of prostate cancer   . GERD (gastroesophageal reflux disease)   . Hemorrhoids   . Hiatal hernia   . History of colon polyps   . Inguinal hernia   . UTI (lower urinary tract infection)   As far as PMHx she has had intermittent back pain (spinal stenosis) x 2 years,  Osteoporosis, random migraines, early cataracts and early hear loss due to schwannoma.   PAST SURGICAL HISTORY: Past Surgical History:  Procedure Laterality Date  . BREAST LUMPECTOMY    . BREAST LUMPECTOMY WITH RADIOACTIVE SEED AND SENTINEL LYMPH NODE BIOPSY Left 12/15/2017   Procedure: LEFT BREAST LUMPECTOMY WITH RADIOACTIVE SEED AND LEFT SENTINEL LYMPH NODE BIOPSY, LEFT BREAST SEED GUIDED EXCISIONAL BIOPSY;  Surgeon: Rolm Bookbinder, MD;  Location: [facility];  Service: General;  Laterality: Left;  . CESAREAN SECTION     2x  . CHOLECYSTECTOMY  2008  . HEMORRHOID SURGERY  2007  . INGUINAL HERNIA REPAIR  1968  . PARTIAL HYSTERECTOMY  1996  . PORT-A-CATH REMOVAL Right 12/15/2017   Procedure: REMOVAL PORT-A-CATH;  Surgeon: Rolm Bookbinder, MD;  Location: [facility];  Service: General;  Laterality: Right;  . PORTACATH PLACEMENT Right 08/05/2017   Procedure: INSERTION PORT-A-CATH WITH [place];  Surgeon: Rolm Bookbinder, MD;  Location: [facility];  Service: General;  Laterality: Right;  . SHOULDER SURGERY Right 2005   torn muscle  . TONSILLECTOMY      FAMILY HISTORY Family History  Problem Relation Age of Onset  . Heart disease Mother   . Melanoma Mother        dx in her 57s  . Ovarian cancer Mother 22  . Heart disease Father   . Diabetes Father   .  Stroke Father   . Ovarian cancer Sister 38  . Pancreatic cancer Maternal Aunt   . Bone cancer Maternal Uncle   . Breast cancer Paternal Aunt   . Non-Hodgkin's lymphoma Maternal Grandmother   . Heart disease Maternal Grandfather   . Heart disease Paternal Grandmother   . Prostate cancer Paternal Grandfather   . Stroke Maternal Uncle   . Bone cancer Maternal Uncle   . Cancer Maternal Aunt        NOS  . Breast cancer Cousin        daughter of mat aunt with NOS cancer  Her mother died from CHF at age 33. Her father died from cardiac issues at age 62. She doesn't have any brothers, but has 3 sisters. She had one sister and her mother with ovarian cancer. Her sister was age 109 when she was diagnosed with ovarian cancer and her mother was in her late 59's when she was diagnosed with ovarian cancer. Her sister was not tested genetically following this. Her maternal grandmother had non-hodgkins lymphoma. Her paternal grandfather had prostate cancer. Her paternal aunt had breast cancer. Her paternal aunt's daughter had breast cancer. She had  several uncles that had prostate cancer that spread to the bones.   GYNECOLOGIC HISTORY:  No LMP recorded. Patient has had a hysterectomy. Menarche: 67 years old Age at first live birth: 67 years old [place] P2 LMP: hysterectomy at age 76 S/p Hysterectomy with USO (L)  SOCIAL HISTORY: She has been retired from Medical sales representative work since 05/26/2017. Her husband, Kasandra Knudsen has been retired from the [employer] for the past 9 years. Their son is Leroy Sea is 69 and a IT trainer. Their daughter, Adonis Brook lives in [facility], is 35 and works in child care at [employer]. The patient has 6 grandchildren ( 2 boys and 4 girls). She is a Psychologist, forensic.      ADVANCED DIRECTIVES:    HEALTH MAINTENANCE: Social History   Tobacco Use  . Smoking status: Never Smoker  . Smokeless tobacco: Never Used  Substance Use Topics  . Alcohol use: No  . Drug use: No      Colonoscopy: Dr. Fuller Plan.   PAP: Dr Nori Riis  Bone density: at Dr. Verlon Au office/ osteoporosis.    Allergies  Allergen Reactions  . Amoxicillin Itching and Rash    Current Outpatient Medications  Medication Sig Dispense Refill  . acetaminophen (TYLENOL) 500 MG tablet Take 500 mg by mouth every 6 (six) hours as needed.    Marland Kitchen alum & mag hydroxide-simeth (MAALOX/MYLANTA) 200-200-20 MG/5ML suspension Take by mouth every 6 (six) hours as needed for indigestion or heartburn.    Marland Kitchen omeprazole (PRILOSEC) 40 MG capsule Take 1 capsule (40 mg total) by mouth 2 (two) times daily. 60 capsule 11  . clotrimazole (MYCELEX) 10 MG troche Take 1 lozenge (10 mg total) by mouth 4 (four) times daily. (Patient not taking: Reported on 01/18/2018) 56 lozenge 0  . dexamethasone (DECADRON) 4 MG tablet Take 2 tablets by mouth once a day on the day after chemotherapy and then take 2 tablets two times a day for 2 days. Take with food. (Patient not taking: Reported on 01/18/2018) 30 tablet 1  . gabapentin (NEURONTIN) 100 MG capsule Take 3 capsules (300 mg total) by mouth at bedtime. Start at '100mg'$  per night and slowly increase to 300 mg 90 capsule 0  . LORazepam (ATIVAN) 0.5 MG tablet Take 1 tablet (0.5 mg total) by mouth at bedtime as needed (Nausea or vomiting). (Patient not taking: Reported on 01/18/2018) 30 tablet 0  . nystatin (MYCOSTATIN) 100000 UNIT/ML suspension Take 5 mLs (500,000 Units total) by mouth 4 (four) times daily. (Patient not taking: Reported on 01/18/2018) 240 mL 1  . oxyCODONE (OXY IR/ROXICODONE) 5 MG immediate release tablet Take 1 tablet (5 mg total) by mouth every 6 (six) hours as needed for moderate pain, severe pain or breakthrough pain. (Patient not taking: Reported on 01/18/2018) 10 tablet 0  . prochlorperazine (COMPAZINE) 10 MG tablet Take 1 tablet (10 mg total) by mouth every 6 (six) hours as needed (Nausea or vomiting). (Patient not taking: Reported on 01/18/2018) 30 tablet 1  . promethazine  (PHENERGAN) 12.5 MG tablet 1-2 p.o. every 6 hours as needed for nausea or headache (Patient not taking: Reported on 01/18/2018) 45 tablet 1  . traMADol (ULTRAM) 50 MG tablet 1-2 tablets every 6 hours as needed for pain (Patient not taking: Reported on 01/18/2018) 30 tablet 0   No current facility-administered medications for this visit.     OBJECTIVE:   Vitals:   03/29/18 1029  BP: (!) 153/78  Pulse: 65  Resp: 18  Temp: 97.9 F (36.6  C)  SpO2: 100%     There is no height or weight on file to calculate BMI.   Wt Readings from Last 3 Encounters:  01/18/18 128 lb (58.1 kg)  01/10/18 128 lb 12.8 oz (58.4 kg)  12/15/17 128 lb (58.1 kg)  ECOG FS:1 - Symptomatic but completely ambulatory GENERAL: Patient is a well appearing female in no acute distress HEENT:  Sclerae anicteric.  Oropharynx clear and moist. No ulcerations or evidence of oropharyngeal candidiasis. Neck is supple.  NODES:  No cervical, supraclavicular, or axillary lymphadenopathy palpated.  BREAST EXAM:  Left breast s/p lumpectomy and radiation, no sign of recurrence, right breast benign LUNGS:  Clear to auscultation bilaterally.  No wheezes or rhonchi. HEART:  Regular rate and rhythm. No murmur appreciated. ABDOMEN:  Soft, nontender.  Positive, normoactive bowel sounds. No organomegaly palpated. MSK:  No focal spinal tenderness to palpation. Full range of motion bilaterally in the upper extremities. EXTREMITIES:  No peripheral edema.   SKIN:  Clear with no obvious rashes or skin changes. No nail dyscrasia. NEURO:  Nonfocal. Well oriented.  Appropriate affect.      LAB RESULTS:  CMP     Component Value Date/Time   NA 142 03/29/2018 0953   K 3.8 03/29/2018 0953   CL 107 03/29/2018 0953   CO2 27 03/29/2018 0953   GLUCOSE 73 03/29/2018 0953   BUN 10 03/29/2018 0953   CREATININE 0.84 03/29/2018 0953   CREATININE 0.81 07/27/2017 1007   CREATININE 0.69 11/16/2011 1054   CALCIUM 9.6 03/29/2018 0953   PROT 7.2  03/29/2018 0953   ALBUMIN 3.9 03/29/2018 0953   AST 16 03/29/2018 0953   AST 16 07/27/2017 1007   ALT 10 03/29/2018 0953   ALT 12 07/27/2017 1007   ALKPHOS 87 03/29/2018 0953   BILITOT 0.4 03/29/2018 0953   BILITOT 0.6 07/27/2017 1007   GFRNONAA >60 03/29/2018 0953   GFRNONAA >60 07/27/2017 1007   GFRAA >60 03/29/2018 0953   GFRAA >60 07/27/2017 1007    No results found for: TOTALPROTELP, ALBUMINELP, A1GS, A2GS, BETS, BETA2SER, GAMS, MSPIKE, SPEI  No results found for: KPAFRELGTCHN, LAMBDASER, KAPLAMBRATIO  Lab Results  Component Value Date   WBC 3.9 (L) 03/29/2018   NEUTROABS 2.5 03/29/2018   HGB 12.7 03/29/2018   HCT 40.1 03/29/2018   MCV 88.9 03/29/2018   PLT 169 03/29/2018    '@LASTCHEMISTRY'$ @  No results found for: LABCA2  No components found for: [id number]  No results for input(s): INR in the last 168 hours.  No results found for: LABCA2  No results found for: HKV425  No results found for: ZDG387  No results found for: FIE332  No results found for: CA2729  No components found for: HGQUANT  No results found for: CEA1 / No results found for: CEA1   No results found for: AFPTUMOR  No results found for: [place]  No results found for: PSA1  Appointment on 03/29/2018  Component Date Value Ref Range Status  . Sodium 03/29/2018 142  135 - 145 mmol/L Final  . Potassium 03/29/2018 3.8  3.5 - 5.1 mmol/L Final  . Chloride 03/29/2018 107  98 - 111 mmol/L Final  . CO2 03/29/2018 27  22 - 32 mmol/L Final  . Glucose, Bld 03/29/2018 73  70 - 99 mg/dL Final  . BUN 03/29/2018 10  8 - 23 mg/dL Final  . Creatinine, Ser 03/29/2018 0.84  0.44 - 1.00 mg/dL Final  . Calcium 03/29/2018 9.6  8.9 - 10.3 mg/dL  Final  . Total Protein 03/29/2018 7.2  6.5 - 8.1 g/dL Final  . Albumin 03/29/2018 3.9  3.5 - 5.0 g/dL Final  . AST 03/29/2018 16  15 - 41 U/L Final  . ALT 03/29/2018 10  0 - 44 U/L Final  . Alkaline Phosphatase 03/29/2018 87  38 - 126 U/L Final  . Total  Bilirubin 03/29/2018 0.4  0.3 - 1.2 mg/dL Final  . GFR calc non Af Amer 03/29/2018 >60  >60 mL/min Final  . GFR calc Af Amer 03/29/2018 >60  >60 mL/min Final   Comment: (NOTE) The eGFR has been calculated using the CKD EPI equation. This calculation has not been validated in all clinical situations. eGFR's persistently <60 mL/min signify possible Chronic Kidney Disease.   Georgiann Hahn gap 03/29/2018 8  5 - 15 Final   Performed at [facility], [place] [address], [place], [place] [address]  . WBC 03/29/2018 3.9* 4.0 - 10.5 K/uL Final  . RBC 03/29/2018 4.51  3.87 - 5.11 MIL/uL Final  . Hemoglobin 03/29/2018 12.7  12.0 - 15.0 g/dL Final  . HCT 03/29/2018 40.1  36.0 - 46.0 % Final  . MCV 03/29/2018 88.9  80.0 - 100.0 fL Final  . MCH 03/29/2018 28.2  26.0 - 34.0 pg Final  . MCHC 03/29/2018 31.7  30.0 - 36.0 g/dL Final  . RDW 03/29/2018 12.9  11.5 - 15.5 % Final  . Platelets 03/29/2018 169  150 - 400 K/uL Final  . nRBC 03/29/2018 0.0  0.0 - 0.2 % Final  . Neutrophils Relative % 03/29/2018 63  % Final  . Neutro Abs 03/29/2018 2.5  1.7 - 7.7 K/uL Final  . Lymphocytes Relative 03/29/2018 24  % Final  . Lymphs Abs 03/29/2018 0.9  0.7 - 4.0 K/uL Final  . Monocytes Relative 03/29/2018 11  % Final  . Monocytes Absolute 03/29/2018 0.5  0.1 - 1.0 K/uL Final  . Eosinophils Relative 03/29/2018 1  % Final  . Eosinophils Absolute 03/29/2018 0.1  0.0 - 0.5 K/uL Final  . Basophils Relative 03/29/2018 1  % Final  . Basophils Absolute 03/29/2018 0.0  0.0 - 0.1 K/uL Final  . Immature Granulocytes 03/29/2018 0  % Final  . Abs Immature Granulocytes 03/29/2018 0.01  0.00 - 0.07 K/uL Final   Performed at [facility], [address] [place], [place] [address]    (this displays the last labs from the last 3 days)  No results found for: TOTALPROTELP, ALBUMINELP, A1GS, A2GS, BETS, BETA2SER, GAMS, MSPIKE, SPEI (this displays SPEP labs)  No results found for:  KPAFRELGTCHN, LAMBDASER, KAPLAMBRATIO (kappa/lambda light chains)  No results found for: HGBA, HGBA2QUANT, HGBFQUANT, HGBSQUAN (Hemoglobinopathy evaluation)   No results found for: LDH  No results found for: IRON, TIBC, IRONPCTSAT (Iron and TIBC)  No results found for: FERRITIN  Urinalysis    Component Value Date/Time   BILIRUBINUR neg 04/06/2014 1019   PROTEINUR neg 04/06/2014 1019   UROBILINOGEN 0.2 04/06/2014 1019   NITRITE neg 04/06/2014 1019   LEUKOCYTESUR Negative 04/06/2014 1019     STUDIES: No results found.   ELIGIBLE FOR AVAILABLE RESEARCH PROTOCOL: no  ASSESSMENT: 67 y.o. [place] woman  (1) status post right lumpectomy 09/02/1994 for ductal carcinoma in situ  (a) status post adjuvant radiation 09/21/1994 through 11/04/1994, total 5040 centigrade to the breast and 604 0 cGy to the tumor bed  (b) did not receive adjuvant antiestrogens  (2) status post left breast upper outer quadrant biopsy  07/22/2017 for a clinical T1c pN0, stage IB invasive ductal carcinoma, grade 3, triple negative, with an MIB-1 of 50%.  (a) lymph node biopsy on the same day was negative for malignancy (concordant)  (b) a second area of architectural distortion was also biopsied, read as fibroadenoma (discordant)  (c) a third area of architectural distortion has not yet been biopsied  (d) breast MRI 07/28/2017 shows a clinical T2 N0 tumor  (3) genetics testing 08/02/2017 through the Multi-Gene Panel offered by [facility] found no deleterious mutations in ALK, APC, ATM, AXIN2,BAP1,  BARD1, BLM, BMPR1A, BRCA1, BRCA2, BRIP1, CASR, CDC73, CDH1, CDK4, CDKN1B, CDKN1C, CDKN2A (p14ARF), CDKN2A (p16INK4a), CEBPA, CHEK2, CTNNA1, DICER1, DIS3L2, EGFR (c.2369C>T, p.Thr790Met variant only), EPCAM (Deletion/duplication testing only), FH, FLCN, GATA2, GPC3, GREM1 (Promoter region deletion/duplication testing only), HOXB13 (c.251G>A, p.Gly84Glu), HRAS, KIT, MAX, MEN1, MET, MITF (c.952G>A, p.Glu318Lys variant  only), MLH1, MSH2, MSH3, MSH6, MUTYH, NBN, NF1, NF2, NTHL1, PALB2, PDGFRA, PHOX2B, PMS2, POLD1, POLE, POT1, PRKAR1A, PTCH1, PTEN, RAD50, RAD51C, RAD51D, RB1, RECQL4, RET, RUNX1, SDHAF2, SDHA (sequence changes only), SDHB, SDHC, SDHD, SMAD4, SMARCA4, SMARCB1, SMARCE1, STK11, SUFU, TERT, TERT, TMEM127, TP53, TSC1, TSC2, VHL, WRN and WT1.  The report date is August 02, 2017.  (a) WT1 c.332C>T VUS identified on the Multi-cancer gene panel.    (4) chemotherapy consisting of doxorubicin/ cyclophosphamide in dose dense fashion x4 started 08/24/2017, completed 10/12/2017,  followed by paclitaxel/ carboplatin weekly   (a) carboplatin/paclitaxel discontinued after 3 doses, stopped 11/23/2017, due to cytopenias  (b) post chemo MRI 12/04/2017 findings a complete imaging response  (5) status post left lumpectomy and sentinel lymph node sampling 12/15/2017 showing a complete pathologic response (ypT0, ypN0)  (6) not a candidate for SWOG O7096 study (post-op pembrolizumab)   (7) adjuvant radiation with Dr. Isidore Moos from 02/07/18 through 03/04/2018: Left breast 40.05 Gy in 15 fractions, boost: 10 Gy in 5 fractions   PLAN: Bernese is doing well today.  She has no clinical or radiographic sign of recurrence of her breast cancer.  She and I reviewed her fatigue, and hopefully it will improve with activity and time.   We reviewed the issue of her left breast pain.  It sounds neuropathic, therefore I prescribed Gabapentin '100mg'$  for her to take every night and titrate up to '300mg'$  QHS.  She understands this and I gave her detailed information in her AVS about this.  We will get Braden's mammogram in 08/2018 which will be 6 months after her radiation.    I reviewed Cailah's plan with Dr. Jana Hakim.  She will see him in April after her mammogram.  I will see her back in 05/2018 for her SCP visit.  She knows to call for any issues that may develop before then.  A total of (30) minutes of face-to-face time was spent with this patient  with greater than 50% of that time in counseling and care-coordination.   Wilber Bihari, NP 03/29/18 1:40 PM Medical Oncology and Hematology [facility] [address] [place], [place] [address] Tel. [phone]    Fax. [phone]

## 2018-03-31 ENCOUNTER — Encounter: Payer: Self-pay | Admitting: Radiation Oncology

## 2018-03-31 NOTE — Progress Notes (Signed)
Stephanie Bautista presents for follow up of radiation completed 03/04/18 to her left breast. She saw Charlestine Massed NP on 03/29/18 and will see her again on 07/07/18 for survivorship appointment. She will see Dr. Jana Hakim on 09/12/18. She reports numbness and aching to her RIGHT arm and thumb. She reports some pain to her Left axilla at times. She also has sharp pain to her left breast occasionally. Her left breast has healed well from radiation. She continues to use radiaplex and knows to use a vitamin E containing lotion when the radiaplex is complete.   BP (!) 146/90 (BP Location: Right Arm, Patient Position: Sitting)   Pulse 84   Temp 98.7 F (37.1 C) (Oral)   Resp 18   Ht 5\' 3"  (1.6 m)   Wt 125 lb 6 oz (56.9 kg)   SpO2 100%   BMI 22.21 kg/m    Wt Readings from Last 3 Encounters:  04/08/18 125 lb 6 oz (56.9 kg)  01/18/18 128 lb (58.1 kg)  01/10/18 128 lb 12.8 oz (58.4 kg)

## 2018-04-08 ENCOUNTER — Other Ambulatory Visit: Payer: Self-pay

## 2018-04-08 ENCOUNTER — Encounter: Payer: Self-pay | Admitting: Radiation Oncology

## 2018-04-08 ENCOUNTER — Ambulatory Visit
Admission: RE | Admit: 2018-04-08 | Discharge: 2018-04-08 | Disposition: A | Discharge status: Home / Self Care | Payer: Medicare Other | Source: Ambulatory Visit | Attending: Radiation Oncology | Admitting: Radiation Oncology

## 2018-04-08 VITALS — BP 146/90 | HR 84 | Temp 98.7°F | Resp 18 | Ht 63.0 in | Wt 125.4 lb

## 2018-04-08 DIAGNOSIS — Z79899 Other long term (current) drug therapy: Secondary | ICD-10-CM | POA: Insufficient documentation

## 2018-04-08 DIAGNOSIS — Z171 Estrogen receptor negative status [ER-]: Secondary | ICD-10-CM

## 2018-04-08 DIAGNOSIS — C50412 Malignant neoplasm of upper-outer quadrant of left female breast: Secondary | ICD-10-CM | POA: Diagnosis not present

## 2018-04-08 HISTORY — DX: Personal history of irradiation: Z92.3

## 2018-04-08 NOTE — Progress Notes (Signed)
Radiation Oncology         [phone] [id number] ________________________________  Name: Stephanie Bautista MRN: [id number]  Date: 04/08/2018  DOB: 09/03/1950  Follow-Up Visit Note  Outpatient  CC: Tisovec, Fransico Him, MD  Magrinat, Virgie Dad, MD  Diagnosis and Prior Radiotherapy:    ICD-10-CM   1. Malignant neoplasm of upper-outer quadrant of left breast in female, estrogen receptor negative ([place]) C50.412    Z17.1     CHIEF COMPLAINT: Here for follow-up and surveillance of left breast cancer  RADIATION DATES:  02/07/2018 - 03/04/2018  Narrative:  The patient returns today for routine 1 month follow-up. She saw Charlestine Massed NP on 03/29/18 and will see her again on 07/07/18 for survivorship appointment. She will see Dr. Jana Hakim on 09/12/18.    She is doing well with occasional pains in the left breast/ axilla.                           ALLERGIES:  is allergic to amoxicillin.  Meds: Current Outpatient Medications  Medication Sig Dispense Refill  . acetaminophen (TYLENOL) 500 MG tablet Take 500 mg by mouth every 6 (six) hours as needed.    Marland Kitchen alum & mag hydroxide-simeth (MAALOX/MYLANTA) 200-200-20 MG/5ML suspension Take by mouth every 6 (six) hours as needed for indigestion or heartburn.    Marland Kitchen omeprazole (PRILOSEC) 40 MG capsule Take 1 capsule (40 mg total) by mouth 2 (two) times daily. 60 capsule 11  . clotrimazole (MYCELEX) 10 MG troche Take 1 lozenge (10 mg total) by mouth 4 (four) times daily. (Patient not taking: Reported on 01/18/2018) 56 lozenge 0  . nystatin (MYCOSTATIN) 100000 UNIT/ML suspension Take 5 mLs (500,000 Units total) by mouth 4 (four) times daily. (Patient not taking: Reported on 01/18/2018) 240 mL 1  . prochlorperazine (COMPAZINE) 10 MG tablet Take 1 tablet (10 mg total) by mouth every 6 (six) hours as needed (Nausea or vomiting). (Patient not taking: Reported on 01/18/2018) 30 tablet 1  . promethazine (PHENERGAN) 12.5 MG tablet 1-2 p.o. every 6 hours as needed for nausea or  headache (Patient not taking: Reported on 01/18/2018) 45 tablet 1  . traMADol (ULTRAM) 50 MG tablet 1-2 tablets every 6 hours as needed for pain (Patient not taking: Reported on 01/18/2018) 30 tablet 0   No current facility-administered medications for this encounter.     Physical Findings: The patient is in no acute distress. Patient is alert and oriented.  height is 5\' 3"  (1.6 m) and weight is 125 lb 6 oz (56.9 kg). Her oral temperature is 98.7 F (37.1 C). Her blood pressure is 146/90 (abnormal) and her pulse is 84. Her respiration is 18 and oxygen saturation is 100%. .    Satisfactory skin healing in radiotherapy fields. Left breast is larger than right and her skin has healed beautifully.    Lab Findings: Lab Results  Component Value Date   WBC 3.9 (L) 03/29/2018   HGB 12.7 03/29/2018   HCT 40.1 03/29/2018   MCV 88.9 03/29/2018   PLT 169 03/29/2018    Radiographic Findings: No results found.  Impression/Plan: Healing well from radiotherapy to the breast tissue.  Continue skin care with topical Vitamin E Oil and / or lotion for at least 2 more months for further healing.  I encouraged her to continue with yearly mammography and followup with medical oncology. I will see her back on an as-needed basis. I have encouraged her to call if she has  any issues or concerns in the future. I wished her the very best.   _____________________________________   Eppie Gibson, MD  This document serves as a record of services personally performed by Eppie Gibson, MD. It was created on her behalf by Margit Banda, a trained medical scribe. The creation of this record is based on the scribe's personal observations and the provider's statements to them. This document has been checked and approved by the attending provider.

## 2018-04-15 ENCOUNTER — Other Ambulatory Visit: Payer: Self-pay | Admitting: *Deleted

## 2018-04-15 DIAGNOSIS — Z171 Estrogen receptor negative status [ER-]: Principal | ICD-10-CM

## 2018-04-15 DIAGNOSIS — C50412 Malignant neoplasm of upper-outer quadrant of left female breast: Secondary | ICD-10-CM

## 2018-04-15 MED ORDER — GABAPENTIN 100 MG PO CAPS: 300.0000 mg | ORAL_CAPSULE | Freq: Every day | ORAL | 0 refills | Status: DC

## 2018-06-16 DIAGNOSIS — E236 Other disorders of pituitary gland: Secondary | ICD-10-CM | POA: Diagnosis not present

## 2018-06-16 DIAGNOSIS — Z9889 Other specified postprocedural states: Secondary | ICD-10-CM | POA: Diagnosis not present

## 2018-06-16 DIAGNOSIS — Z880 Allergy status to penicillin: Secondary | ICD-10-CM | POA: Diagnosis not present

## 2018-06-16 DIAGNOSIS — C50919 Malignant neoplasm of unspecified site of unspecified female breast: Secondary | ICD-10-CM | POA: Diagnosis not present

## 2018-06-16 DIAGNOSIS — Z9221 Personal history of antineoplastic chemotherapy: Secondary | ICD-10-CM | POA: Diagnosis not present

## 2018-06-16 DIAGNOSIS — H918X2 Other specified hearing loss, left ear: Secondary | ICD-10-CM | POA: Diagnosis not present

## 2018-06-16 DIAGNOSIS — D333 Benign neoplasm of cranial nerves: Secondary | ICD-10-CM | POA: Diagnosis not present

## 2018-06-16 DIAGNOSIS — R2689 Other abnormalities of gait and mobility: Secondary | ICD-10-CM | POA: Diagnosis not present

## 2018-06-16 DIAGNOSIS — H903 Sensorineural hearing loss, bilateral: Secondary | ICD-10-CM | POA: Diagnosis not present

## 2018-06-16 DIAGNOSIS — H61892 Other specified disorders of left external ear: Secondary | ICD-10-CM | POA: Diagnosis not present

## 2018-06-16 DIAGNOSIS — Z923 Personal history of irradiation: Secondary | ICD-10-CM | POA: Diagnosis not present

## 2018-06-25 DIAGNOSIS — E236 Other disorders of pituitary gland: Secondary | ICD-10-CM | POA: Diagnosis not present

## 2018-06-28 ENCOUNTER — Encounter: Payer: Self-pay | Admitting: Oncology

## 2018-06-28 DIAGNOSIS — Z853 Personal history of malignant neoplasm of breast: Secondary | ICD-10-CM | POA: Diagnosis not present

## 2018-06-29 ENCOUNTER — Telehealth: Payer: Self-pay

## 2018-06-29 NOTE — Telephone Encounter (Signed)
Spoke with patient to remind of SCP visit with NP on 07/07/18 at 10 am.  Patient said she will come to appointment.

## 2018-07-05 DIAGNOSIS — Z87442 Personal history of urinary calculi: Secondary | ICD-10-CM | POA: Diagnosis not present

## 2018-07-05 DIAGNOSIS — K219 Gastro-esophageal reflux disease without esophagitis: Secondary | ICD-10-CM | POA: Diagnosis not present

## 2018-07-05 DIAGNOSIS — Z923 Personal history of irradiation: Secondary | ICD-10-CM | POA: Diagnosis not present

## 2018-07-05 DIAGNOSIS — Z8349 Family history of other endocrine, nutritional and metabolic diseases: Secondary | ICD-10-CM | POA: Diagnosis not present

## 2018-07-05 DIAGNOSIS — M810 Age-related osteoporosis without current pathological fracture: Secondary | ICD-10-CM | POA: Diagnosis not present

## 2018-07-05 DIAGNOSIS — Z853 Personal history of malignant neoplasm of breast: Secondary | ICD-10-CM | POA: Diagnosis not present

## 2018-07-05 DIAGNOSIS — D352 Benign neoplasm of pituitary gland: Secondary | ICD-10-CM | POA: Diagnosis not present

## 2018-07-07 ENCOUNTER — Encounter: Payer: Self-pay | Admitting: Adult Health

## 2018-07-07 ENCOUNTER — Inpatient Hospital Stay: Payer: Medicare Other | Attending: Oncology | Admitting: Adult Health

## 2018-07-07 VITALS — BP 147/68 | HR 73 | Temp 97.5°F | Resp 18 | Ht 63.0 in | Wt 127.1 lb

## 2018-07-07 DIAGNOSIS — R5383 Other fatigue: Secondary | ICD-10-CM | POA: Diagnosis not present

## 2018-07-07 DIAGNOSIS — C50412 Malignant neoplasm of upper-outer quadrant of left female breast: Secondary | ICD-10-CM | POA: Diagnosis not present

## 2018-07-07 DIAGNOSIS — M810 Age-related osteoporosis without current pathological fracture: Secondary | ICD-10-CM | POA: Diagnosis not present

## 2018-07-07 DIAGNOSIS — N644 Mastodynia: Secondary | ICD-10-CM | POA: Insufficient documentation

## 2018-07-07 DIAGNOSIS — I890 Lymphedema, not elsewhere classified: Secondary | ICD-10-CM | POA: Diagnosis not present

## 2018-07-07 DIAGNOSIS — Z171 Estrogen receptor negative status [ER-]: Secondary | ICD-10-CM

## 2018-07-07 NOTE — Progress Notes (Signed)
CLINIC:  Survivorship   REASON FOR VISIT:  Routine follow-up post-treatment for a recent history of breast cancer.  BRIEF ONCOLOGIC HISTORY:    Malignant neoplasm of upper-outer quadrant of left breast in female, estrogen receptor negative ([place])   09/02/1994 Surgery    status post right lumpectomy 09/02/1994 for ductal carcinoma in situ             (a) status post adjuvant radiation 09/21/1994 through 11/04/1994, total 5040 centigrade to the breast and 604 0 cGy to the tumor bed             (b) did not receive adjuvant antiestrogens    07/22/2017 Initial Biopsy     status post left breast upper outer quadrant biopsy for a clinical T1c pN0, stage IB invasive ductal carcinoma, grade 3, triple negative, with an MIB-1 of 50%.             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  The Multi-Gene Panel offered by [facility] includes sequencing and/or deletion duplication testing of the following 83 genes: ALK, APC, ATM, AXIN2,BAP1,  BARD1, BLM, BMPR1A, BRCA1, BRCA2, BRIP1, CASR, CDC73, CDH1, CDK4, CDKN1B, CDKN1C, CDKN2A (p14ARF), CDKN2A (p16INK4a), CEBPA, CHEK2, CTNNA1, DICER1, DIS3L2, EGFR (c.2369C>T, p.Thr790Met variant only), EPCAM (Deletion/duplication testing only), FH, FLCN, GATA2, GPC3, GREM1 (Promoter region deletion/duplication testing only), HOXB13 (c.251G>A, p.Gly84Glu), HRAS, KIT, MAX, MEN1, MET, MITF (c.952G>A, p.Glu318Lys variant only), MLH1, MSH2, MSH3, MSH6, MUTYH, NBN, NF1, NF2, NTHL1, PALB2, PDGFRA, PHOX2B, PMS2, POLD1, POLE, POT1, PRKAR1A, PTCH1, PTEN, RAD50, RAD51C, RAD51D, RB1, RECQL4, RET, RUNX1,  SDHAF2, SDHA (sequence changes only), SDHB, SDHC, SDHD, SMAD4, SMARCA4, SMARCB1, SMARCE1, STK11, SUFU, TERT, TERT, TMEM127, TP53, TSC1, TSC2, VHL, WRN and WT1.  The report date is August 02, 2017.     08/24/2017 - 11/23/2017 Neo-Adjuvant Chemotherapy     chemotherapy consisting of doxorubicin/ cyclophosphamide in dose dense fashion x4 started 08/24/2017, completed 10/12/2017,  followed by paclitaxel/ carboplatin weekly              (a) carboplatin/paclitaxel discontinued after 3 doses, stopped 11/23/2017, due to cytopenias             (b) post chemo MRI 12/04/2017 findings a complete imaging response    12/15/2017 Surgery    status post left lumpectomy and sentinel lymph node sampling showing a complete pathologic response (ypT0, ypN0)    02/07/2018 - 03/04/2018 Radiation Therapy    adjuvant radiation with Dr. Isidore Moos: Left breast 40.05 Gy in 15 fractions, boost: 10 Gy in 5 fractions      INTERVAL HISTORY:  Stephanie Bautista presents to the [facility] today for our initial meeting to review her survivorship care plan detailing her treatment course for breast cancer, as well as monitoring long-term side effects of that treatment, education regarding health maintenance, screening, and overall wellness and health promotion.     Overall, Stephanie Bautista reports feeling quite well.  She notes that she has  intermittent pain in her left breast, occasional striking pain, occasional numbness in her left arm.  She has no peripheral neuropathy.  She previously did, however it resolved.  Stephanie Bautista has continued fatigue, she attributes this to the weather.    She underwent bilateral diagnostic 3d mammogram at [facility] 2 weeks ago.  She was told it was negative.  I do not have a copy of these results.      REVIEW OF SYSTEMS:  Review of Systems  Constitutional: Positive for fatigue. Negative for appetite change, chills, fever and unexpected weight change.  HENT:   Negative for hearing loss, lump/mass, sore throat and  trouble swallowing.   Eyes: Negative for eye problems and icterus.  Respiratory: Negative for chest tightness, cough and shortness of breath.   Cardiovascular: Negative for chest pain, leg swelling and palpitations.  Gastrointestinal: Positive for constipation (chronic). Negative for abdominal distention, abdominal pain, blood in stool, diarrhea, nausea and vomiting.  Endocrine: Negative for hot flashes.  Genitourinary: Negative for difficulty urinating.   Musculoskeletal: Negative for arthralgias.  Skin: Negative for itching and rash.  Neurological: Negative for dizziness, extremity weakness, headaches and numbness.  Hematological: Negative for adenopathy. Does not bruise/bleed easily.  Psychiatric/Behavioral: Negative for depression. The patient is not nervous/anxious.   Breast: Denies any new nodularity, masses, tenderness, nipple changes, or nipple discharge.      ONCOLOGY TREATMENT TEAM:  1. Surgeon:  Dr. Donne Hazel at [facility] 2. Medical Oncologist: Dr. Jana Hakim  3. Radiation Oncologist: Dr. Isidore Moos    PAST MEDICAL/SURGICAL HISTORY:  Past Medical History:  Diagnosis Date  . Acoustic neuroma ([place])   . Adenomatous colon polyp 12/2002  . Arthritis   . Breast cancer ([place]) 1996   right breast cancer in 1996, left breast cancer in 2019  . Cholelithiases   . Esophageal stricture   . Family history of breast cancer   . Family history of ovarian cancer   . Family history of pancreatic cancer   . Family history of prostate cancer   . GERD (gastroesophageal reflux disease)   . Hemorrhoids   . Hiatal hernia   . History of colon polyps   . History of radiation therapy 02/07/18-03/04/2018   Left Breast/ 40.05 Gy in 15 fractions, Left Breast boost/ 10 Gy in 5 fractions.   . Inguinal hernia   . UTI (lower urinary tract infection)    Past Surgical History:  Procedure Laterality Date  . BREAST LUMPECTOMY    . BREAST LUMPECTOMY WITH RADIOACTIVE SEED AND SENTINEL LYMPH  NODE BIOPSY Left 12/15/2017   Procedure: LEFT BREAST LUMPECTOMY WITH RADIOACTIVE SEED AND LEFT SENTINEL LYMPH NODE BIOPSY, LEFT BREAST SEED GUIDED EXCISIONAL BIOPSY;  Surgeon: Rolm Bookbinder, MD;  Location: [place];  Service: General;  Laterality: Left;  . CESAREAN SECTION     2x  . CHOLECYSTECTOMY  2008  . HEMORRHOID SURGERY  2007  . INGUINAL HERNIA REPAIR  1968  . PARTIAL HYSTERECTOMY  1996  . PORT-A-CATH REMOVAL Right 12/15/2017   Procedure: REMOVAL PORT-A-CATH;  Surgeon: Rolm Bookbinder, MD;  Location: [place];  Service: General;  Laterality: Right;  . PORTACATH PLACEMENT Right 08/05/2017   Procedure: INSERTION PORT-A-CATH WITH [place];  Surgeon: Rolm Bookbinder, MD;  Location: [place];  Service: General;  Laterality: Right;  . SHOULDER SURGERY Right 2005   torn muscle  . TONSILLECTOMY       ALLERGIES:  Allergies  Allergen Reactions  . Amoxicillin Itching and Rash  CURRENT MEDICATIONS:  Outpatient Encounter Medications as of 07/07/2018  Medication Sig  . acetaminophen (TYLENOL) 500 MG tablet Take 500 mg by mouth every 6 (six) hours as needed.  Marland Kitchen omeprazole (PRILOSEC) 40 MG capsule Take 1 capsule (40 mg total) by mouth 2 (two) times daily. (Patient taking differently: Take 40 mg by mouth daily. )  . alum & mag hydroxide-simeth (MAALOX/MYLANTA) 200-200-20 MG/5ML suspension Take by mouth every 6 (six) hours as needed for indigestion or heartburn.  . clotrimazole (MYCELEX) 10 MG troche Take 1 lozenge (10 mg total) by mouth 4 (four) times daily. (Patient not taking: Reported on 01/18/2018)  . gabapentin (NEURONTIN) 100 MG capsule Take 3 capsules (300 mg total) by mouth at bedtime. Start at '100mg'$  per night and slowly increase to 300 mg (Patient not taking: Reported on 07/07/2018)  . nystatin (MYCOSTATIN) 100000 UNIT/ML suspension Take 5 mLs (500,000 Units total) by mouth 4 (four) times daily. (Patient not taking: Reported on  01/18/2018)  . prochlorperazine (COMPAZINE) 10 MG tablet Take 1 tablet (10 mg total) by mouth every 6 (six) hours as needed (Nausea or vomiting). (Patient not taking: Reported on 01/18/2018)  . promethazine (PHENERGAN) 12.5 MG tablet 1-2 p.o. every 6 hours as needed for nausea or headache (Patient not taking: Reported on 01/18/2018)  . traMADol (ULTRAM) 50 MG tablet 1-2 tablets every 6 hours as needed for pain (Patient not taking: Reported on 01/18/2018)   No facility-administered encounter medications on file as of 07/07/2018.      ONCOLOGIC FAMILY HISTORY:  Family History  Problem Relation Age of Onset  . Heart disease Mother   . Melanoma Mother        dx in her 9s  . Ovarian cancer Mother 51  . Heart disease Father   . Diabetes Father   . Stroke Father   . Ovarian cancer Sister 81  . Pancreatic cancer Maternal Aunt   . Bone cancer Maternal Uncle   . Breast cancer Paternal Aunt   . Non-Hodgkin's lymphoma Maternal Grandmother   . Heart disease Maternal Grandfather   . Heart disease Paternal Grandmother   . Prostate cancer Paternal Grandfather   . Stroke Maternal Uncle   . Bone cancer Maternal Uncle   . Cancer Maternal Aunt        NOS  . Breast cancer Cousin        daughter of mat aunt with NOS cancer     GENETIC COUNSELING/TESTING: See above  SOCIAL HISTORY:  Social History   Socioeconomic History  . Marital status: Married    Spouse name: Not on file  . Number of children: 2  . Years of education: Not on file  . Highest education level: Not on file  Occupational History  . Occupation: Office manager: [employer]  Social Needs  . Financial resource strain: Not on file  . Food insecurity:    Worry: Not on file    Inability: Not on file  . Transportation needs:    Medical: No    Non-medical: No  Tobacco Use  . Smoking status: Never Smoker  . Smokeless tobacco: Never Used  Substance and Sexual Activity  . Alcohol use: No  . Drug use: No  . Sexual  activity: Not on file  Lifestyle  . Physical activity:    Days per week: Not on file    Minutes per session: Not on file  . Stress: Not on file  Relationships  . Social connections:  Talks on phone: Not on file    Gets together: Not on file    Attends religious service: Not on file    Active member of club or organization: Not on file    Attends meetings of clubs or organizations: Not on file    Relationship status: Not on file  . Intimate partner violence:    Fear of current or ex partner: No    Emotionally abused: No    Physically abused: No    Forced sexual activity: No  Other Topics Concern  . Not on file  Social History Narrative  . Not on file      PHYSICAL EXAMINATION:  Vital Signs:   Vitals:   07/07/18 1001  BP: (!) 147/68  Pulse: 73  Resp: 18  Temp: (!) 97.5 F (36.4 C)  SpO2: 100%   Filed Weights   07/07/18 1001  Weight: 127 lb 1.6 oz (57.7 kg)   General: Well-nourished, well-appearing female in no acute distress.  She is accompanied in clinic by her husband today.   HEENT: Head is normocephalic.  Pupils equal and reactive to light. Conjunctivae clear without exudate.  Sclerae anicteric. Oral mucosa is pink, moist.  Oropharynx is pink without lesions or erythema.  Lymph: No cervical, supraclavicular, or infraclavicular lymphadenopathy noted on palpation.  Cardiovascular: Regular rate and rhythm.Marland Kitchen Respiratory: Clear to auscultation bilaterally. Chest expansion symmetric; breathing non-labored.  Breasts: left breast s/p lumpectomy, generalized left upper outer breast swelling into axilla noted, right breast s/p lumpectomy, both sides without evidence of recurrence. GI: Abdomen soft and round; non-tender, non-distended. Bowel sounds normoactive.  GU: Deferred.  Neuro: No focal deficits. Steady gait.  Psych: Mood and affect normal and appropriate for situation.  Extremities: No edema. MSK: No focal spinal tenderness to palpation.  Full range of motion in  bilateral upper extremities Skin: Warm and dry.  LABORATORY DATA:  None for this visit.  DIAGNOSTIC IMAGING:  None for this visit.      ASSESSMENT AND PLAN:  Ms.. Bautista is a pleasant 68 y.o. female with Stage IB left breast invasive ductal carcinoma, ER-/PR-/HER2-, diagnosed in 06/2017, treated with neoadjuvant chemotherapy, lumpectomy, and adjuvant radiation therapy.  She presents to the Survivorship Clinic for our initial meeting and routine follow-up post-completion of treatment for breast cancer.    1. Stage IB left breast cancer:  Stephanie Bautista is continuing to recover from definitive treatment for breast cancer. She will follow-up with her medical oncologist, Dr. Jana Hakim in 08/2018 with history and physical exam per surveillance protocol.  Today, a comprehensive survivorship care plan and treatment summary was reviewed with the patient today detailing her breast cancer diagnosis, treatment course, potential late/long-term effects of treatment, appropriate follow-up care with recommendations for the future, and patient education resources.  A copy of this summary, along with a letter will be sent to the patient's primary care provider via mail/fax/In Basket message after today's visit.    2. Bone health:  Given Stephanie Bautista's age/history of breast cancer, she is at risk for bone demineralization.  She undergoes bone density testing with her gynecologist.  She has osteoporosis and is planning on starting treatment with bisphosphanates soon.  She was given education on specific activities to promote bone health.  3. Cancer screening:  Due to Stephanie Bautista's history and her age, she should receive screening for skin cancers, colon cancer, and gynecologic cancers.  The information and recommendations are listed on the patient's comprehensive care plan/treatment summary and were reviewed in detail  with the patient.    4. Health maintenance and wellness promotion: Stephanie Bautista was encouraged to  consume 5-7 servings of fruits and vegetables per day. We reviewed the "Nutrition Rainbow" handout, as well as the handout "Take Control of Your Health and Reduce Your Cancer Risk" from the [employer].  She was also encouraged to engage in moderate to vigorous exercise for 30 minutes per day most days of the week. We discussed the [employer] fitness program, which is designed for cancer survivors to help them become more physically fit after cancer treatments.  She was instructed to limit her alcohol consumption and continue to abstain from tobacco use.     5. Support services/counseling: It is not uncommon for this period of the patient's cancer care trajectory to be one of many emotions and stressors.  We discussed an opportunity for her to participate in the next session of [facility] ("Finding Your New Normal") support group series designed for patients after they have completed treatment.   Stephanie Bautista was encouraged to take advantage of our many other support services programs, support groups, and/or counseling in coping with her new life as a cancer survivor after completing anti-cancer treatment.  She was offered support today through active listening and expressive supportive counseling.  She was given information regarding our available services and encouraged to contact me with any questions or for help enrolling in any of our support group/programs.   6. Breast lymphedema: Referred to physical therapy.  Dispo:   -Return to cancer center in 08/2018 for f/u with Dr. Jana Hakim -Mammogram due in 06/2019  -Follow up with surgery 06/2018 for f/u with Dr. Donne Hazel -She is welcome to return back to the Survivorship Clinic at any time; no additional follow-up needed at this time.  -Consider referral back to survivorship as a long-term survivor for continued surveillance  A total of (30) minutes of face-to-face time was spent with this patient with greater than 50% of that time in counseling  and care-coordination.   Gardenia Phlegm, [place] [phone]   Note: PRIMARY CARE PROVIDER Haywood Pao, [place] [phone]

## 2018-07-08 ENCOUNTER — Telehealth: Payer: Self-pay | Admitting: Adult Health

## 2018-07-08 NOTE — Telephone Encounter (Signed)
No los °

## 2018-07-11 ENCOUNTER — Telehealth: Payer: Self-pay | Admitting: *Deleted

## 2018-07-11 NOTE — Telephone Encounter (Signed)
On 07-11-18 fax medical records to [facility] it was consult note, end of tx note, sim and planning note, follow up note.

## 2018-07-12 DIAGNOSIS — Z6822 Body mass index (BMI) 22.0-22.9, adult: Secondary | ICD-10-CM | POA: Diagnosis not present

## 2018-07-12 DIAGNOSIS — C50412 Malignant neoplasm of upper-outer quadrant of left female breast: Secondary | ICD-10-CM | POA: Diagnosis not present

## 2018-07-12 DIAGNOSIS — Z779 Other contact with and (suspected) exposures hazardous to health: Secondary | ICD-10-CM | POA: Diagnosis not present

## 2018-07-13 ENCOUNTER — Ambulatory Visit: Payer: Medicare Other | Attending: Adult Health | Admitting: Physical Therapy

## 2018-07-13 ENCOUNTER — Other Ambulatory Visit: Payer: Self-pay

## 2018-07-13 ENCOUNTER — Encounter: Payer: Self-pay | Admitting: Physical Therapy

## 2018-07-13 DIAGNOSIS — M6281 Muscle weakness (generalized): Secondary | ICD-10-CM | POA: Diagnosis not present

## 2018-07-13 DIAGNOSIS — Z483 Aftercare following surgery for neoplasm: Secondary | ICD-10-CM | POA: Diagnosis not present

## 2018-07-13 DIAGNOSIS — L599 Disorder of the skin and subcutaneous tissue related to radiation, unspecified: Secondary | ICD-10-CM | POA: Insufficient documentation

## 2018-07-13 DIAGNOSIS — M25612 Stiffness of left shoulder, not elsewhere classified: Secondary | ICD-10-CM | POA: Insufficient documentation

## 2018-07-13 DIAGNOSIS — R293 Abnormal posture: Secondary | ICD-10-CM

## 2018-07-13 NOTE — Therapy (Signed)
[place], [place], [address] Phone: [phone]   Fax:  [phone]  Physical Therapy Evaluation  Patient Details  Name: Stephanie Bautista MRN: [id number] Date of Birth: 1950-06-12 Referring Provider (PT): Wilber Bihari    Encounter Date: 07/13/2018  PT End of Session - 07/13/18 1212    Visit Number  1    Number of Visits  9    Date for PT Re-Evaluation  08/11/18    PT Start Time  1105    PT Stop Time  1145    PT Time Calculation (min)  40 min    Activity Tolerance  Patient tolerated treatment well    Behavior During Therapy  [facility] for tasks assessed/performed       Past Medical History:  Diagnosis Date  . Acoustic neuroma ([place])   . Adenomatous colon polyp 12/2002  . Arthritis   . Breast cancer ([place]) 1996   right breast cancer in 1996, left breast cancer in 2019  . Cholelithiases   . Esophageal stricture   . Family history of breast cancer   . Family history of ovarian cancer   . Family history of pancreatic cancer   . Family history of prostate cancer   . GERD (gastroesophageal reflux disease)   . Hemorrhoids   . Hiatal hernia   . History of colon polyps   . History of radiation therapy 02/07/18-03/04/2018   Left Breast/ 40.05 Gy in 15 fractions, Left Breast boost/ 10 Gy in 5 fractions.   . Inguinal hernia   . UTI (lower urinary tract infection)     Past Surgical History:  Procedure Laterality Date  . BREAST LUMPECTOMY    . BREAST LUMPECTOMY WITH RADIOACTIVE SEED AND SENTINEL LYMPH NODE BIOPSY Left 12/15/2017   Procedure: LEFT BREAST LUMPECTOMY WITH RADIOACTIVE SEED AND LEFT SENTINEL LYMPH NODE BIOPSY, LEFT BREAST SEED GUIDED EXCISIONAL BIOPSY;  Surgeon: Rolm Bookbinder, MD;  Location: [place];  Service: General;  Laterality: Left;  . CESAREAN SECTION     2x  . CHOLECYSTECTOMY  2008  . HEMORRHOID SURGERY  2007  . INGUINAL HERNIA REPAIR  1968  . PARTIAL HYSTERECTOMY   1996  . PORT-A-CATH REMOVAL Right 12/15/2017   Procedure: REMOVAL PORT-A-CATH;  Surgeon: Rolm Bookbinder, MD;  Location: [place];  Service: General;  Laterality: Right;  . PORTACATH PLACEMENT Right 08/05/2017   Procedure: INSERTION PORT-A-CATH WITH [place];  Surgeon: Rolm Bookbinder, MD;  Location: [place];  Service: General;  Laterality: Right;  . SHOULDER SURGERY Right 2005   torn muscle  . TONSILLECTOMY      There were no vitals filed for this visit.   Subjective Assessment - 07/13/18 1111    Subjective  " One week I'm fine , the next week its like I can't lift it"( regarding her left arm )   She decribes symptoms in her axilla     Pertinent History  Left breast cancer diagnosed Feb. 28, 2019, Had chemo but had to stop because counts went down and couldn't get back up, then had lumpectomy with 3 nodes removed. She says she had to have drainage x 3(?seroma?) She then had radiation. completed 03/04/2018.  Past history inclused breast cacner on right side 21 years ago with lumpectomy with no nodes but did have radiation with some intemittent soreness since, also had right shoulder surgery  pasat histroy includes lower back problems and tightness in upper back  Has osteoporosis.  She will be starting ReClast ( once a year infusion) Has acoustic nueroma with hearing loss and occasional balance impairment . she reports collapsed vertebrae cervical 5,6,7    Patient Stated Goals  to learn what to do to help her shoulder get back 100% arm movement,     Currently in Pain?  No/denies         [facility] PT Assessment - 07/13/18 0001      Assessment   Medical Diagnosis  left breast cancer     Referring Provider (PT)  Wilber Bihari     Onset Date/Surgical Date  07/22/17    Hand Dominance  Right    Prior Therapy  yes 1 visit prior to radiaiton       Precautions   Precautions  None      Restrictions   Weight Bearing Restrictions  No      Balance Screen   Has  the patient fallen in the past 6 months  No    Has the patient had a decrease in activity level because of a fear of falling?   No    Is the patient reluctant to leave their home because of a fear of falling?   No      Home Film/video editor residence    Living Arrangements  Spouse/significant other    Available Help at Discharge  Available PRN/intermittently      Prior Function   Level of [place]  Retired    Leisure  wants to get back to riding a bike, Dealer, water skiing, likes to boat       Cognition   Overall Cognitive Status  Within Functional Limits for tasks assessed      Observation/Other Assessments   Observations  fullness in left axilla and upper lateral chest     Skin Integrity  well healed incision     Other Surveys   Other Surveys    Quick DASH   54.55      Sensation   Light Touch  Not tested      Coordination   Gross Motor Movements are Fluid and Coordinated  Yes      Posture/Postural Control   Posture/Postural Control  Postural limitations    Posture Comments  mild scoliosis with posterior rib rotation on right muscles tightness in right upper trap, left thoracic area and right lumbar area       ROM / Strength   AROM / PROM / Strength  AROM;Strength      AROM   Right Shoulder Flexion  155 Degrees    Right Shoulder ABduction  160 Degrees    Right Shoulder Internal Rotation  50 Degrees    Right Shoulder External Rotation  90 Degrees    Left Shoulder Flexion  150 Degrees    Left Shoulder ABduction  140 Degrees    Left Shoulder Internal Rotation  60 Degrees    Left Shoulder External Rotation  90 Degrees    Cervical - Right Side Bend  30    Cervical - Left Side Bend  15    Cervical - Right Rotation  50    measured in supine    Cervical - Left Rotation  50   measured in supine      Strength   Overall Strength  Deficits    Overall Strength Comments  pt appears to have general deconditioning and  decreased circumference in left upper arm compared  to right arm likely due to decreased muscle mass       Palpation   Palpation comment  tender tightness in left pec major and right upper traps and posterior axillary area ( lats/teres major )         LYMPHEDEMA/ONCOLOGY QUESTIONNAIRE - 07/13/18 1142      Right Upper Extremity Lymphedema   10 cm Proximal to Olecranon Process  26 cm    Olecranon Process  23.5 cm    15 cm Proximal to Ulnar Styloid Process  23 cm    Just Proximal to Ulnar Styloid Process  14 cm    Across Hand at [employer]  18.5 cm    At [place] of 2nd Digit  6 cm      Left Upper Extremity Lymphedema   10 cm Proximal to Olecranon Process  24.5 cm    Olecranon Process  24 cm    15 cm Proximal to Ulnar Styloid Process  21.5 cm    Just Proximal to Ulnar Styloid Process  14.5 cm    Across Hand at [employer]  18.2 cm    At [place] of 2nd Digit  5.7 cm          Quick Dash - 07/13/18 0001    Open a tight or new jar  Severe difficulty    Do heavy household chores (wash walls, wash floors)  Moderate difficulty    Carry a shopping bag or briefcase  Moderate difficulty    Wash your back  Moderate difficulty    Use a knife to cut food  Moderate difficulty    Recreational activities in which you take some force or impact through your arm, shoulder, or hand (golf, hammering, tennis)  Severe difficulty    During the past week, to what extent has your arm, shoulder or hand problem interfered with your normal social activities with family, friends, neighbors, or groups?  Quite a bit    During the past week, to what extent has your arm, shoulder or hand problem limited your work or other regular daily activities  Modererately    Arm, shoulder, or hand pain.  Moderate    Tingling (pins and needles) in your arm, shoulder, or hand  Mild    Difficulty Sleeping  Moderate difficulty    DASH Score  54.55 %        Objective measurements completed on examination: See above  findings.                   PT Long Term Goals - 07/13/18 1158      PT LONG TERM GOAL #1   Title  Pt will report the sharp shooting pains in her breast are decreased by 50%    Time  4    Period  Weeks    Status  New      PT LONG TERM GOAL #2   Title  Pt will state that she is able to turn her head while driving easier by 81%     Time  4    Period  Weeks    Status  New      PT LONG TERM GOAL #3   Title  Pt will be educated on strength ABC program and osteoporosis exercise     Time  4    Period  Weeks    Status  New      PT LONG TERM GOAL #4   Title  Pt will have  increase in left arm circumference by at 10 cm proximal to olecranon by .5 cm indicating an improvment in left upper arm muscle mass     Time  4    Period  Weeks    Status  New      PT LONG TERM GOAL #5   Title  Pt will decrease  Quick DASH score to < 40 indicating an improvment in function of left UE    Baseline  54.44    Time  4    Period  Weeks    Status  New             Plan - 07/13/18 1213    Clinical Impression Statement  68 yo female s/p treatment for triple negative breast cancer with including lumpectomy with sentinel node removal. seroma aspriation ( per pt) and radiation.  She has osteoporosis, history of scolosis and muscle tightness and tenderness. and acoustic neuroma that is affecting her balance so she will benefit from a general exercise program for strength as well as soft tissue work, range of motion , stretching and manual techniques     History and Personal Factors relevant to plan of care:  multiple medical issues. , previous radiation     Clinical Presentation  Evolving    Clinical Presentation due to:  will be having radiation for acoustic neuroma     Clinical Decision Making  Moderate    Rehab Potential  Good    Clinical Impairments Affecting Rehab Potential  osteoporosis, previous radiation, scolosis with mucle imbalances     PT Frequency  2x / week    PT Duration  4  weeks    PT Treatment/Interventions  ADLs/Self Care Home Management;Therapeutic activities;Patient/family education;Manual lymph drainage;Therapeutic exercise;Manual techniques;Passive range of motion;Compression bandaging;Scar mobilization    PT Next Visit Plan  MLD to left axilla and lateral trunk with soft tissue work to left pecs and soft tissue work to bilateral upper traps, back and posterior axilla.  ROM to neck and shoudlers, progress to stretnghening and osteoporosis exercise    Consulted and Agree with Plan of Care  Patient       Patient will benefit from skilled therapeutic intervention in order to improve the following deficits and impairments:  Increased fascial restricitons, Pain, Postural dysfunction, Decreased strength, Decreased range of motion, Impaired UE functional use, Impaired perceived functional ability, Increased edema  Visit Diagnosis: Disorder of the skin and subcutaneous tissue related to radiation, unspecified - Plan: PT plan of care cert/re-cert  Stiffness of left shoulder, not elsewhere classified - Plan: PT plan of care cert/re-cert  Muscle weakness (generalized) - Plan: PT plan of care cert/re-cert  Abnormal posture - Plan: PT plan of care cert/re-cert     Problem List Patient Active Problem List   Diagnosis Date Noted  . Chemotherapy induced neutropenia ([place]) 10/05/2017  . Thrush, oral 10/05/2017  . Port-A-Cath in place 08/23/2017  . Genetic testing 08/03/2017  . Family history of breast cancer 07/27/2017  . Ductal carcinoma in situ (DCIS) of right breast 07/27/2017  . Schwannoma of cranial nerve ([place]) 07/27/2017  . Family history of prostate cancer   . Family history of ovarian cancer   . Family history of pancreatic cancer   . History of colon polyps   . Tinnitus of left ear 08/12/2011  . Nailbed injury 08/12/2011  . Sensorineural hearing loss, unilateral 08/12/2011  . Malignant neoplasm of upper-outer quadrant of left breast in female,  estrogen receptor negative ([place]) 10/07/2007  .  ADENOMATOUS COLONIC POLYP 10/07/2007  . HEMORRHOIDS 10/07/2007  . GERD 10/07/2007  . GASTRITIS 10/07/2007  . INGUINAL HERNIA 10/07/2007  . CHOLELITHIASIS 10/07/2007  . ARTHRITIS 10/07/2007   Donato Heinz. Owens Shark PT  Norwood Levo 07/13/2018, 12:25 PM  [place], [place], [address] Phone: [phone]   Fax:  [phone]  Name: Stephanie Bautista MRN: [id number] Date of Birth: [birth year]

## 2018-07-14 ENCOUNTER — Encounter: Payer: Self-pay | Admitting: Physical Therapy

## 2018-07-14 ENCOUNTER — Ambulatory Visit: Payer: Medicare Other | Admitting: Physical Therapy

## 2018-07-14 DIAGNOSIS — M6281 Muscle weakness (generalized): Secondary | ICD-10-CM | POA: Diagnosis not present

## 2018-07-14 DIAGNOSIS — L599 Disorder of the skin and subcutaneous tissue related to radiation, unspecified: Secondary | ICD-10-CM | POA: Diagnosis not present

## 2018-07-14 DIAGNOSIS — R293 Abnormal posture: Secondary | ICD-10-CM

## 2018-07-14 DIAGNOSIS — Z483 Aftercare following surgery for neoplasm: Secondary | ICD-10-CM | POA: Diagnosis not present

## 2018-07-14 DIAGNOSIS — M25612 Stiffness of left shoulder, not elsewhere classified: Secondary | ICD-10-CM | POA: Diagnosis not present

## 2018-07-14 NOTE — Patient Instructions (Addendum)
1. Decompression Exercise     Cancer Rehab [phone]    Lie on back on firm surface, knees bent, feet flat, arms turned up, out to sides, backs of hands down. Time _5-15__ minutes. Surface: floor   2. Shoulder Press    Start in Decompression Exercise position. Press shoulders downward towards supporting surface. Hold __2-3__ seconds while counting out loud. Repeat _3-5___ times. Do _1-2___ times per day.   3. Head Press    Bring cervical spine (neck) into neutral position (by either tucking the chin towards the chest or tilting the chin upward). Feel weight on back of head. Press head downward into supporting surface.    Hold _2-3__ seconds. Repeat _3-5__ times. Do _1-2__ times per day.   4. Leg Lengthener    Straighten one leg. Pull toes AND forefoot toward knee, extend heel. Lengthen leg by pulling pelvis away from ribs. Hold _2-3__ seconds. Relax. Repeat __4-6__ times. Do other leg.  Surface: floor   5. Leg Press    Straighten one leg down to floor keeping leg aligned with hip. Pull toes AND forefoot toward knee; extend heel.  Press entire leg downward (as if pressing leg into sandy beach). DO NOT BEND KNEE. Hold _2-3__ seconds. Do __4-6__ times. Repeat with other leg.   SHOULDER: Flexion - Supine (Cane)        Cancer Rehab [phone]    Hold cane in both hands. Raise arms up overhead. Do not allow back to arch. Hold _5__ seconds. Do __5-10__ times; __1-2__ times a day.   SELF ASSISTED WITH OBJECT: Shoulder Abduction / Adduction - Supine    Hold cane with both hands. Move both arms from side to side, keep elbows straight.  Hold when stretch felt for __5__ seconds. Repeat __5-10__ times; __1-2__ times a day. Once this becomes easier progress to third picture bringing affected arm towards ear by staying out to side. Same hold for _5_seconds. Repeat  _5-10_ times, _1-2_ times/day.  Shoulder Blade Stretch    Clasp fingers behind head with elbows touching in front of  face. Pull elbows back while pressing shoulder blades together. Relax and hold as tolerated, can place pillow under elbow here for comfort as needed and to allow for prolonged stretch.  Repeat __5__ times. Do __1-2__ sessions per day.     Copyright  VHI. All rights reserved.

## 2018-07-14 NOTE — Therapy (Signed)
[place], [place], [address] Phone: [phone]   Fax:  [phone]  Physical Therapy Treatment  Patient Details  Name: Stephanie Bautista MRN: [id number] Date of Birth: 1951-04-26 Referring Provider (PT): Wilber Bihari    Encounter Date: 07/14/2018  PT End of Session - 07/14/18 1116    Visit Number  2    Number of Visits  9    Date for PT Re-Evaluation  08/11/18    PT Start Time  7253    PT Stop Time  1100    PT Time Calculation (min)  45 min    Activity Tolerance  Patient tolerated treatment well    Behavior During Therapy  [facility] for tasks assessed/performed       Past Medical History:  Diagnosis Date  . Acoustic neuroma ([place])   . Adenomatous colon polyp 12/2002  . Arthritis   . Breast cancer ([place]) 1996   right breast cancer in 1996, left breast cancer in 2019  . Cholelithiases   . Esophageal stricture   . Family history of breast cancer   . Family history of ovarian cancer   . Family history of pancreatic cancer   . Family history of prostate cancer   . GERD (gastroesophageal reflux disease)   . Hemorrhoids   . Hiatal hernia   . History of colon polyps   . History of radiation therapy 02/07/18-03/04/2018   Left Breast/ 40.05 Gy in 15 fractions, Left Breast boost/ 10 Gy in 5 fractions.   . Inguinal hernia   . UTI (lower urinary tract infection)     Past Surgical History:  Procedure Laterality Date  . BREAST LUMPECTOMY    . BREAST LUMPECTOMY WITH RADIOACTIVE SEED AND SENTINEL LYMPH NODE BIOPSY Left 12/15/2017   Procedure: LEFT BREAST LUMPECTOMY WITH RADIOACTIVE SEED AND LEFT SENTINEL LYMPH NODE BIOPSY, LEFT BREAST SEED GUIDED EXCISIONAL BIOPSY;  Surgeon: Rolm Bookbinder, MD;  Location: [place];  Service: General;  Laterality: Left;  . CESAREAN SECTION     2x  . CHOLECYSTECTOMY  2008  . HEMORRHOID SURGERY  2007  . INGUINAL HERNIA REPAIR  1968  . PARTIAL HYSTERECTOMY   1996  . PORT-A-CATH REMOVAL Right 12/15/2017   Procedure: REMOVAL PORT-A-CATH;  Surgeon: Rolm Bookbinder, MD;  Location: [facility];  Service: General;  Laterality: Right;  . PORTACATH PLACEMENT Right 08/05/2017   Procedure: INSERTION PORT-A-CATH WITH [place];  Surgeon: Rolm Bookbinder, MD;  Location: [place];  Service: General;  Laterality: Right;  . SHOULDER SURGERY Right 2005   torn muscle  . TONSILLECTOMY      There were no vitals filed for this visit.  Subjective Assessment - 07/14/18 1026    Subjective  " I got so sore last night!"  Pt states she had lots of shooting down in to her breast and had to sleep with a camisole on.     Pertinent History  Left breast cancer diagnosed Feb. 28, 2019, Had chemo but had to stop because counts went down and couldn't get back up, then had lumpectomy with 3 nodes removed. She says she had to have drainage x 3(?seroma?) She then had radiation. completed 03/04/2018.  Past history inclused breast cacner on right side 21 years ago with lumpectomy with no nodes but did have radiation with some intemittent soreness since, also had right shoulder surgery  pasat histroy includes lower back problems and tightness in upper back  Has osteoporosis.  She  will be starting ReClast ( once a year infusion) Has acoustic nueroma with hearing loss and occasional balance impairment . she reports collapsed vertebrae cervical 5,6,7    Patient Stated Goals  to learn what to do to help her shoulder get back 100% arm movement,     Currently in Pain?  Yes    Pain Score  6     Pain Location  Breast   sore    Pain Orientation  Left    Pain Descriptors / Indicators  Sore    Pain Type  Chronic pain                       [facility] Adult PT Treatment/Exercise - 07/14/18 0001      Self-Care   Self-Care  Other Self-Care Comments    Other Self-Care Comments   small dotted foam patch placed at lateral chest inside bra at full area        Exercises   Exercises  Shoulder      Shoulder Exercises: Supine   Other Supine Exercises  meeks decompression x 3 reps     Other Supine Exercises  supine dowel flexion, abduction and butterfly stretch       Shoulder Exercises: Sidelying   ABduction  AROM;Left;5 reps    Other Sidelying Exercises  small circles with hand pointed to ceiling       Manual Therapy   Manual Therapy  Soft tissue mobilization;Manual Lymphatic Drainage (MLD)    Manual therapy comments  explained the lymph system     Soft tissue mobilization  in right sidelying with thick massage cream. deeper soft tissue work to tight muscles in left scapular area, posterior shoulder, pec majot and posterior axilla     Manual Lymphatic Drainage (MLD)  short neck, superficial and deep abdominals, left inguinal nodes. left axillo-inguinal anastamosis, left breast and axilla moving down toward abdomen, then to right sidelying for more work on lateral chest and back              PT Education - 07/14/18 1115    Education Details  lymphatic system and began instruction in MLD technique     Person(s) Educated  Patient    Methods  Explanation;Demonstration;Tactile cues;Verbal cues    Comprehension  Need further instruction          PT Long Term Goals - 07/13/18 1158      PT LONG TERM GOAL #1   Title  Pt will report the sharp shooting pains in her breast are decreased by 50%    Time  4    Period  Weeks    Status  New      PT LONG TERM GOAL #2   Title  Pt will state that she is able to turn her head while driving easier by 67%     Time  4    Period  Weeks    Status  New      PT LONG TERM GOAL #3   Title  Pt will be educated on strength ABC program and osteoporosis exercise     Time  4    Period  Weeks    Status  New      PT LONG TERM GOAL #4   Title  Pt will have increase in left arm circumference by at 10 cm proximal to olecranon by .5 cm indicating an improvment in left upper arm muscle mass     Time  4  Period  Weeks    Status  New      PT LONG TERM GOAL #5   Title  Pt will decrease  Quick DASH score to < 40 indicating an improvment in function of left UE    Baseline  54.44    Time  4    Period  Weeks    Status  New            Plan - 07/14/18 1116    Clinical Impression Statement  Pt with palpable fullness in left lateral chest with tight muscles in anterior and posterior shoulder also.  Pt palpably softer after treatment and reported she felt better.  Added localized compression pad to wear inside bra and cami     Rehab Potential  Good    Clinical Impairments Affecting Rehab Potential  osteoporosis, previous radiation, scolosis with mucle imbalances     PT Treatment/Interventions  ADLs/Self Care Home Management;Therapeutic activities;Patient/family education;Manual lymph drainage;Therapeutic exercise;Manual techniques;Passive range of motion;Compression bandaging;Scar mobilization    PT Next Visit Plan  assess effect of last treatment. Add shoulder ROM cont. MLD to left axilla and lateral trunk with soft tissue work to left pecs and soft tissue work to bilateral upper traps, back and posterior axilla.  ROM to neck and shoudlers, progress to stretnghening and osteoporosis exercise    Consulted and Agree with Plan of Care  Patient       Patient will benefit from skilled therapeutic intervention in order to improve the following deficits and impairments:  Increased fascial restricitons, Pain, Postural dysfunction, Decreased strength, Decreased range of motion, Impaired UE functional use, Impaired perceived functional ability, Increased edema  Visit Diagnosis: Disorder of the skin and subcutaneous tissue related to radiation, unspecified  Stiffness of left shoulder, not elsewhere classified  Muscle weakness (generalized)  Abnormal posture  Aftercare following surgery for neoplasm     Problem List Patient Active Problem List   Diagnosis Date Noted  . Chemotherapy induced  neutropenia ([place]) 10/05/2017  . Thrush, oral 10/05/2017  . Port-A-Cath in place 08/23/2017  . Genetic testing 08/03/2017  . Family history of breast cancer 07/27/2017  . Ductal carcinoma in situ (DCIS) of right breast 07/27/2017  . Schwannoma of cranial nerve ([place]) 07/27/2017  . Family history of prostate cancer   . Family history of ovarian cancer   . Family history of pancreatic cancer   . History of colon polyps   . Tinnitus of left ear 08/12/2011  . Nailbed injury 08/12/2011  . Sensorineural hearing loss, unilateral 08/12/2011  . Malignant neoplasm of upper-outer quadrant of left breast in female, estrogen receptor negative ([place]) 10/07/2007  . ADENOMATOUS COLONIC POLYP 10/07/2007  . HEMORRHOIDS 10/07/2007  . GERD 10/07/2007  . GASTRITIS 10/07/2007  . INGUINAL HERNIA 10/07/2007  . CHOLELITHIASIS 10/07/2007  . ARTHRITIS 10/07/2007   Donato Heinz. Owens Shark PT  Norwood Levo 07/14/2018, 11:19 AM  [place], [place], [address] Phone: [phone]   Fax:  [phone]  Name: Kayda B Livesey MRN: [id number] Date of Birth: Oct 07, 1950

## 2018-07-18 ENCOUNTER — Encounter: Payer: Self-pay | Admitting: Physical Therapy

## 2018-07-18 ENCOUNTER — Ambulatory Visit: Payer: Medicare Other | Admitting: Physical Therapy

## 2018-07-18 DIAGNOSIS — M25612 Stiffness of left shoulder, not elsewhere classified: Secondary | ICD-10-CM

## 2018-07-18 DIAGNOSIS — R293 Abnormal posture: Secondary | ICD-10-CM

## 2018-07-18 DIAGNOSIS — L599 Disorder of the skin and subcutaneous tissue related to radiation, unspecified: Secondary | ICD-10-CM

## 2018-07-18 DIAGNOSIS — M6281 Muscle weakness (generalized): Secondary | ICD-10-CM | POA: Diagnosis not present

## 2018-07-18 DIAGNOSIS — Z483 Aftercare following surgery for neoplasm: Secondary | ICD-10-CM

## 2018-07-18 NOTE — Therapy (Signed)
[place], [place], [address] Phone: [phone]   Fax:  [phone]  Physical Therapy Treatment  Patient Details  Name: Sharmila B Koffman MRN: [id number] Date of Birth: 1950-12-17 Referring Provider (PT): Wilber Bihari    Encounter Date: 07/18/2018  PT End of Session - 07/18/18 1513    Visit Number  3    Number of Visits  9    Date for PT Re-Evaluation  08/11/18    PT Start Time  1430    PT Stop Time  1515    PT Time Calculation (min)  45 min    Activity Tolerance  Patient tolerated treatment well    Behavior During Therapy  [facility] for tasks assessed/performed       Past Medical History:  Diagnosis Date  . Acoustic neuroma ([place])   . Adenomatous colon polyp 12/2002  . Arthritis   . Breast cancer ([place]) 1996   right breast cancer in 1996, left breast cancer in 2019  . Cholelithiases   . Esophageal stricture   . Family history of breast cancer   . Family history of ovarian cancer   . Family history of pancreatic cancer   . Family history of prostate cancer   . GERD (gastroesophageal reflux disease)   . Hemorrhoids   . Hiatal hernia   . History of colon polyps   . History of radiation therapy 02/07/18-03/04/2018   Left Breast/ 40.05 Gy in 15 fractions, Left Breast boost/ 10 Gy in 5 fractions.   . Inguinal hernia   . UTI (lower urinary tract infection)     Past Surgical History:  Procedure Laterality Date  . BREAST LUMPECTOMY    . BREAST LUMPECTOMY WITH RADIOACTIVE SEED AND SENTINEL LYMPH NODE BIOPSY Left 12/15/2017   Procedure: LEFT BREAST LUMPECTOMY WITH RADIOACTIVE SEED AND LEFT SENTINEL LYMPH NODE BIOPSY, LEFT BREAST SEED GUIDED EXCISIONAL BIOPSY;  Surgeon: Rolm Bookbinder, MD;  Location: [place];  Service: General;  Laterality: Left;  . CESAREAN SECTION     2x  . CHOLECYSTECTOMY  2008  . HEMORRHOID SURGERY  2007  . INGUINAL HERNIA REPAIR  1968  . PARTIAL HYSTERECTOMY   1996  . PORT-A-CATH REMOVAL Right 12/15/2017   Procedure: REMOVAL PORT-A-CATH;  Surgeon: Rolm Bookbinder, MD;  Location: [facility];  Service: General;  Laterality: Right;  . PORTACATH PLACEMENT Right 08/05/2017   Procedure: INSERTION PORT-A-CATH WITH [place];  Surgeon: Rolm Bookbinder, MD;  Location: [facility];  Service: General;  Laterality: Right;  . SHOULDER SURGERY Right 2005   torn muscle  . TONSILLECTOMY      There were no vitals filed for this visit.  Subjective Assessment - 07/18/18 1436    Subjective  Pt states she is doing well.  She got sore, but it seemed to work out over the weekend.  She has been wearing the foam in her bra over the weekend   (Pended)     Pertinent History  Left breast cancer diagnosed Feb. 28, 2019, Had chemo but had to stop because counts went down and couldn't get back up, then had lumpectomy with 3 nodes removed. She says she had to have drainage x 3(?seroma?) She then had radiation. completed 03/04/2018.  Past history inclused breast cacner on right side 21 years ago with lumpectomy with no nodes but did have radiation with some intemittent soreness since, also had right shoulder surgery  pasat histroy includes lower back problems and tightness in  upper back  Has osteoporosis.  She will be starting ReClast ( once a year infusion) Has acoustic nueroma with hearing loss and occasional balance impairment . she reports collapsed vertebrae cervical 5,6,7                       [facility] Adult PT Treatment/Exercise - 07/18/18 0001      Exercises   Exercises  Shoulder      Shoulder Exercises: Supine   Protraction  AROM;Left;10 reps    Horizontal ABduction  Strengthening;Right;Left;5 reps    Theraband Level (Shoulder Horizontal ABduction)  Level 1 (Yellow)    External Rotation  Strengthening;Right;Left;5 reps;Theraband    Theraband Level (Shoulder External Rotation)  Level 1 (Yellow)    Flexion   Strengthening;Right;Left;5 reps;Theraband   wide and narrow grip    Theraband Level (Shoulder Flexion)  Level 1 (Yellow)    Diagonals  Strengthening;Right;Left;5 reps;Theraband    Theraband Level (Shoulder Diagonals)  Level 1 (Yellow)      Manual Therapy   Soft tissue mobilization  in right sidelying with thick massage cream. deeper soft tissue work to tight muscles in left scapular area, posterior shoulder, pec majot and posterior axilla    pt with tender tight areas at posterior shoulder            PT Education - 07/18/18 1513    Education Details  supine scapular series     Person(s) Educated  Patient    Methods  Explanation;Demonstration;Handout    Comprehension  Verbalized understanding;Returned demonstration          PT Long Term Goals - 07/13/18 1158      PT LONG TERM GOAL #1   Title  Pt will report the sharp shooting pains in her breast are decreased by 50%    Time  4    Period  Weeks    Status  New      PT LONG TERM GOAL #2   Title  Pt will state that she is able to turn her head while driving easier by 70%     Time  4    Period  Weeks    Status  New      PT LONG TERM GOAL #3   Title  Pt will be educated on strength ABC program and osteoporosis exercise     Time  4    Period  Weeks    Status  New      PT LONG TERM GOAL #4   Title  Pt will have increase in left arm circumference by at 10 cm proximal to olecranon by .5 cm indicating an improvment in left upper arm muscle mass     Time  4    Period  Weeks    Status  New      PT LONG TERM GOAL #5   Title  Pt will decrease  Quick DASH score to < 40 indicating an improvment in function of left UE    Baseline  54.44    Time  4    Period  Weeks    Status  New            Plan - 07/18/18 1513    Clinical Impression Statement  Pt continues to have tight muscles around anterior and posterior shoulder that lessened with soft tissue work and prolonged pressure. She feels that the episodes of sharp pain  are lessening in frequency.  Upgraded her to supine theraband exercise today  for scapular strength     Clinical Impairments Affecting Rehab Potential  osteoporosis, previous radiation, scolosis with mucle imbalances     PT Frequency  2x / week    PT Duration  4 weeks    PT Treatment/Interventions  ADLs/Self Care Home Management;Therapeutic activities;Patient/family education;Manual lymph drainage;Therapeutic exercise;Manual techniques;Passive range of motion;Compression bandaging;Scar mobilization    PT Next Visit Plan  assess effect of last treatment. Add shoulder ROM cont. MLD to left axilla and lateral trunk with soft tissue work to left pecs and soft tissue work to bilateral upper traps, back and posterior axilla.  ROM to neck and shoudlers, progress to stretnghening and osteoporosis exercise    Consulted and Agree with Plan of Care  Patient       Patient will benefit from skilled therapeutic intervention in order to improve the following deficits and impairments:  Increased fascial restricitons, Pain, Postural dysfunction, Decreased strength, Decreased range of motion, Impaired UE functional use, Impaired perceived functional ability, Increased edema  Visit Diagnosis: Disorder of the skin and subcutaneous tissue related to radiation, unspecified  Stiffness of left shoulder, not elsewhere classified  Muscle weakness (generalized)  Abnormal posture  Aftercare following surgery for neoplasm     Problem List Patient Active Problem List   Diagnosis Date Noted  . Chemotherapy induced neutropenia ([place]) 10/05/2017  . Thrush, oral 10/05/2017  . Port-A-Cath in place 08/23/2017  . Genetic testing 08/03/2017  . Family history of breast cancer 07/27/2017  . Ductal carcinoma in situ (DCIS) of right breast 07/27/2017  . Schwannoma of cranial nerve ([place]) 07/27/2017  . Family history of prostate cancer   . Family history of ovarian cancer   . Family history of pancreatic cancer   . History  of colon polyps   . Tinnitus of left ear 08/12/2011  . Nailbed injury 08/12/2011  . Sensorineural hearing loss, unilateral 08/12/2011  . Malignant neoplasm of upper-outer quadrant of left breast in female, estrogen receptor negative ([place]) 10/07/2007  . ADENOMATOUS COLONIC POLYP 10/07/2007  . HEMORRHOIDS 10/07/2007  . GERD 10/07/2007  . GASTRITIS 10/07/2007  . INGUINAL HERNIA 10/07/2007  . CHOLELITHIASIS 10/07/2007  . ARTHRITIS 10/07/2007   Donato Heinz. Owens Shark PT  Norwood Levo 07/18/2018, 3:17 PM  [place], [place], [address] Phone: [phone]   Fax:  [phone]  Name: Keva B Seaborn MRN: [id number] Date of Birth: 1951/04/08

## 2018-07-18 NOTE — Patient Instructions (Signed)

## 2018-07-19 DIAGNOSIS — D352 Benign neoplasm of pituitary gland: Secondary | ICD-10-CM | POA: Diagnosis not present

## 2018-07-20 DIAGNOSIS — Z923 Personal history of irradiation: Secondary | ICD-10-CM | POA: Diagnosis not present

## 2018-07-20 DIAGNOSIS — Z853 Personal history of malignant neoplasm of breast: Secondary | ICD-10-CM | POA: Diagnosis not present

## 2018-07-20 DIAGNOSIS — R42 Dizziness and giddiness: Secondary | ICD-10-CM | POA: Diagnosis not present

## 2018-07-20 DIAGNOSIS — D352 Benign neoplasm of pituitary gland: Secondary | ICD-10-CM | POA: Diagnosis not present

## 2018-07-20 DIAGNOSIS — Z9221 Personal history of antineoplastic chemotherapy: Secondary | ICD-10-CM | POA: Diagnosis not present

## 2018-07-20 DIAGNOSIS — D333 Benign neoplasm of cranial nerves: Secondary | ICD-10-CM | POA: Diagnosis not present

## 2018-07-21 ENCOUNTER — Ambulatory Visit: Payer: Medicare Other

## 2018-07-21 DIAGNOSIS — M6281 Muscle weakness (generalized): Secondary | ICD-10-CM | POA: Diagnosis not present

## 2018-07-21 DIAGNOSIS — Z483 Aftercare following surgery for neoplasm: Secondary | ICD-10-CM | POA: Diagnosis not present

## 2018-07-21 DIAGNOSIS — L599 Disorder of the skin and subcutaneous tissue related to radiation, unspecified: Secondary | ICD-10-CM

## 2018-07-21 DIAGNOSIS — R293 Abnormal posture: Secondary | ICD-10-CM | POA: Diagnosis not present

## 2018-07-21 DIAGNOSIS — M25612 Stiffness of left shoulder, not elsewhere classified: Secondary | ICD-10-CM

## 2018-07-21 NOTE — Patient Instructions (Signed)
Self manual lymph drainage: Perform this sequence once a day.  Only give enough pressure no your skin to make the skin move.  Diaphragmatic - Supine   Inhale through nose making navel move out toward hands. Exhale through puckered lips, hands follow navel in. Repeat _5__ times. Rest _10__ seconds between repeats.   Copyright  VHI. All rights reserved.  Hug yourself.  Do circles at your neck just above your collarbones.  Repeat this 10 times.  Axilla - One at a Time   Using full weight of flat hand and fingers at center of uninvolved armpit, make _10__ in-place circles.   Copyright  VHI. All rights reserved.  LEG: Inguinal Nodes Stimulation   With small finger side of hand against hip crease on involved side, gently perform circles at the crease. Repeat __10_ times.   Copyright  VHI. All rights reserved.  1) Axilla to Inguinal Nodes - Sweep   On involved side, sweep _4__ times from armpit along side of trunk to hip crease.  Now gently stretch skin from the involved side to the uninvolved side across the chest at the shoulder line.  Repeat that 4 times.  Draw an imaginary diagonal line from upper outer breast through the nipple area toward lower inner breast.  Direct fluid upward and inward from this line toward the pathway across your upper chest .  Do this in three rows to treat all of the upper inner breast tissue, and do each row 3-4x.      Direct fluid to treat all of lower outer breast tissue downward and outward toward      pathway that is aimed at the left groin.  Finish by doing the pathways as described above going from your involved armpit to the same side groin and going across your upper chest from the involved shoulder to the uninvolved shoulder.  Repeat the steps above where you do circles in your left groin and right armpit.

## 2018-07-21 NOTE — Therapy (Signed)
[place], [place], [address] Phone: [phone]   Fax:  [phone]  Physical Therapy Treatment  Patient Details  Name: Stephanie Bautista MRN: [id number] Date of Birth: [birth year] Referring Provider (PT): Wilber Bihari    Encounter Date: 07/21/2018  PT End of Session - 07/21/18 1017    Visit Number  4    Number of Visits  9    Date for PT Re-Evaluation  08/11/18    PT Start Time  0937    PT Stop Time  1016    PT Time Calculation (min)  39 min    Activity Tolerance  Patient tolerated treatment well    Behavior During Therapy  [facility] for tasks assessed/performed       Past Medical History:  Diagnosis Date  . Acoustic neuroma ([place])   . Adenomatous colon polyp 12/2002  . Arthritis   . Breast cancer ([place]) 1996   right breast cancer in 1996, left breast cancer in 2019  . Cholelithiases   . Esophageal stricture   . Family history of breast cancer   . Family history of ovarian cancer   . Family history of pancreatic cancer   . Family history of prostate cancer   . GERD (gastroesophageal reflux disease)   . Hemorrhoids   . Hiatal hernia   . History of colon polyps   . History of radiation therapy 02/07/18-03/04/2018   Left Breast/ 40.05 Gy in 15 fractions, Left Breast boost/ 10 Gy in 5 fractions.   . Inguinal hernia   . UTI (lower urinary tract infection)     Past Surgical History:  Procedure Laterality Date  . BREAST LUMPECTOMY    . BREAST LUMPECTOMY WITH RADIOACTIVE SEED AND SENTINEL LYMPH NODE BIOPSY Left 12/15/2017   Procedure: LEFT BREAST LUMPECTOMY WITH RADIOACTIVE SEED AND LEFT SENTINEL LYMPH NODE BIOPSY, LEFT BREAST SEED GUIDED EXCISIONAL BIOPSY;  Surgeon: Rolm Bookbinder, MD;  Location: [facility];  Service: General;  Laterality: Left;  . CESAREAN SECTION     2x  . CHOLECYSTECTOMY  2008  . HEMORRHOID SURGERY  2007  . INGUINAL HERNIA REPAIR  1968  . PARTIAL HYSTERECTOMY   1996  . PORT-A-CATH REMOVAL Right 12/15/2017   Procedure: REMOVAL PORT-A-CATH;  Surgeon: Rolm Bookbinder, MD;  Location: [facility];  Service: General;  Laterality: Right;  . PORTACATH PLACEMENT Right 08/05/2017   Procedure: INSERTION PORT-A-CATH WITH [place];  Surgeon: Rolm Bookbinder, MD;  Location: [facility];  Service: General;  Laterality: Right;  . SHOULDER SURGERY Right 2005   torn muscle  . TONSILLECTOMY      There were no vitals filed for this visit.  Subjective Assessment - 07/21/18 0941    Subjective  I felt really good after last visit. I've been doing the theraband exercises and I can tell they are helping. The Lt breast is getting softer as well.     Pertinent History  Left breast cancer diagnosed Feb. 28, 2019, Had chemo but had to stop because counts went down and couldn't get back up, then had lumpectomy with 3 nodes removed. She says she had to have drainage x 3(?seroma?) She then had radiation. completed 03/04/2018.  Past history inclused breast cacner on right side 21 years ago with lumpectomy with no nodes but did have radiation with some intemittent soreness since, also had right shoulder surgery  pasat histroy includes lower back problems and tightness in upper back  Has osteoporosis.  She  will be starting ReClast ( once a year infusion) Has acoustic nueroma with hearing loss and occasional balance impairment . she reports collapsed vertebrae cervical 5,6,7    Patient Stated Goals  to learn what to do to help her shoulder get back 100% arm movement,     Currently in Pain?  No/denies                       [facility] Adult PT Treatment/Exercise - 07/21/18 0001      Manual Therapy   Manual Therapy  Myofascial release;Passive ROM;Manual Lymphatic Drainage (MLD)    Myofascial Release  To Lt axilla during P/ROM and UE pulling throughout    Manual Lymphatic Drainage (MLD)  short neck, superficial and deep abdominals, left inguinal nodes.  left axillo-inguinal anastamosis, left breast and axilla moving down toward abdomen, then to right sidelying for more work on lateral chest and back     Passive ROM  In Supine to Lt shoulder into flexion, abduction and D2             PT Education - 07/21/18 1016    Education Details  Self MLD    Person(s) Educated  Patient    Methods  Explanation;Demonstration;Handout    Comprehension  Verbalized understanding;Returned demonstration;Need further instruction          PT Long Term Goals - 07/13/18 1158      PT LONG TERM GOAL #1   Title  Pt will report the sharp shooting pains in her breast are decreased by 50%    Time  4    Period  Weeks    Status  New      PT LONG TERM GOAL #2   Title  Pt will state that she is able to turn her head while driving easier by 40%     Time  4    Period  Weeks    Status  New      PT LONG TERM GOAL #3   Title  Pt will be educated on strength ABC program and osteoporosis exercise     Time  4    Period  Weeks    Status  New      PT LONG TERM GOAL #4   Title  Pt will have increase in left arm circumference by at 10 cm proximal to olecranon by .5 cm indicating an improvment in left upper arm muscle mass     Time  4    Period  Weeks    Status  New      PT LONG TERM GOAL #5   Title  Pt will decrease  Quick DASH score to < 40 indicating an improvment in function of left UE    Baseline  54.44    Time  4    Period  Weeks    Status  New            Plan - 07/21/18 1018    Clinical Impression Statement  Added P/ROM and myofascial release along with continuing manual lymph drainage instructing pt in this today. She tolerated session very well and reported feeling good at end of session. Issued handout for self MLD.     Rehab Potential  Good    Clinical Impairments Affecting Rehab Potential  osteoporosis, previous radiation, scolosis with mucle imbalances     PT Frequency  2x / week    PT Duration  4 weeks    PT Treatment/Interventions  ADLs/Self Care Home Management;Therapeutic activities;Patient/family education;Manual lymph drainage;Therapeutic exercise;Manual techniques;Passive range of motion;Compression bandaging;Scar mobilization    PT Next Visit Plan  assess effect of last treatment. Cont shoulder ROM cont. MLD to left axilla and lateral trunk with soft tissue work to left pecs and soft tissue work to bilateral upper traps, back and posterior axilla.  ROM to neck and shoudlers, progress to stretnghening and osteoporosis exercise    Consulted and Agree with Plan of Care  Patient       Patient will benefit from skilled therapeutic intervention in order to improve the following deficits and impairments:  Increased fascial restricitons, Pain, Postural dysfunction, Decreased strength, Decreased range of motion, Impaired UE functional use, Impaired perceived functional ability, Increased edema  Visit Diagnosis: Disorder of the skin and subcutaneous tissue related to radiation, unspecified  Stiffness of left shoulder, not elsewhere classified  Muscle weakness (generalized)  Abnormal posture  Aftercare following surgery for neoplasm     Problem List Patient Active Problem List   Diagnosis Date Noted  . Chemotherapy induced neutropenia ([place]) 10/05/2017  . Thrush, oral 10/05/2017  . Port-A-Cath in place 08/23/2017  . Genetic testing 08/03/2017  . Family history of breast cancer 07/27/2017  . Ductal carcinoma in situ (DCIS) of right breast 07/27/2017  . Schwannoma of cranial nerve ([place]) 07/27/2017  . Family history of prostate cancer   . Family history of ovarian cancer   . Family history of pancreatic cancer   . History of colon polyps   . Tinnitus of left ear 08/12/2011  . Nailbed injury 08/12/2011  . Sensorineural hearing loss, unilateral 08/12/2011  . Malignant neoplasm of upper-outer quadrant of left breast in female, estrogen receptor negative ([place]) 10/07/2007  . ADENOMATOUS COLONIC POLYP 10/07/2007  .  HEMORRHOIDS 10/07/2007  . GERD 10/07/2007  . GASTRITIS 10/07/2007  . INGUINAL HERNIA 10/07/2007  . CHOLELITHIASIS 10/07/2007  . ARTHRITIS 10/07/2007    Otelia Limes, PTA 07/21/2018, 10:20 AM  [place], [place], [address] Phone: [phone]   Fax:  [phone]  Name: Stephanie Bautista MRN: [id number] Date of Birth: [birth year]

## 2018-07-25 ENCOUNTER — Ambulatory Visit: Payer: Medicare Other | Attending: Adult Health

## 2018-07-25 DIAGNOSIS — L599 Disorder of the skin and subcutaneous tissue related to radiation, unspecified: Secondary | ICD-10-CM | POA: Diagnosis not present

## 2018-07-25 DIAGNOSIS — M25612 Stiffness of left shoulder, not elsewhere classified: Secondary | ICD-10-CM

## 2018-07-25 DIAGNOSIS — Z483 Aftercare following surgery for neoplasm: Secondary | ICD-10-CM | POA: Diagnosis not present

## 2018-07-25 DIAGNOSIS — N644 Mastodynia: Secondary | ICD-10-CM | POA: Diagnosis not present

## 2018-07-25 DIAGNOSIS — R2681 Unsteadiness on feet: Secondary | ICD-10-CM | POA: Insufficient documentation

## 2018-07-25 DIAGNOSIS — M6281 Muscle weakness (generalized): Secondary | ICD-10-CM | POA: Diagnosis not present

## 2018-07-25 DIAGNOSIS — R293 Abnormal posture: Secondary | ICD-10-CM | POA: Insufficient documentation

## 2018-07-25 NOTE — Therapy (Signed)
[place], [place], [address] Phone: [phone]   Fax:  [phone]  Physical Therapy Treatment  Patient Details  Name: Stephanie Bautista MRN: [id number] Date of Birth: 27-Aug-1950 Referring Provider (PT): Wilber Bihari    Encounter Date: 07/25/2018  PT End of Session - 07/25/18 0933    Visit Number  5    Number of Visits  9    Date for PT Re-Evaluation  08/11/18    PT Start Time  0852    PT Stop Time  0933    PT Time Calculation (min)  41 min    Activity Tolerance  Patient tolerated treatment well    Behavior During Therapy  [facility] for tasks assessed/performed       Past Medical History:  Diagnosis Date  . Acoustic neuroma ([place])   . Adenomatous colon polyp 12/2002  . Arthritis   . Breast cancer ([place]) 1996   right breast cancer in 1996, left breast cancer in 2019  . Cholelithiases   . Esophageal stricture   . Family history of breast cancer   . Family history of ovarian cancer   . Family history of pancreatic cancer   . Family history of prostate cancer   . GERD (gastroesophageal reflux disease)   . Hemorrhoids   . Hiatal hernia   . History of colon polyps   . History of radiation therapy 02/07/18-03/04/2018   Left Breast/ 40.05 Gy in 15 fractions, Left Breast boost/ 10 Gy in 5 fractions.   . Inguinal hernia   . UTI (lower urinary tract infection)     Past Surgical History:  Procedure Laterality Date  . BREAST LUMPECTOMY    . BREAST LUMPECTOMY WITH RADIOACTIVE SEED AND SENTINEL LYMPH NODE BIOPSY Left 12/15/2017   Procedure: LEFT BREAST LUMPECTOMY WITH RADIOACTIVE SEED AND LEFT SENTINEL LYMPH NODE BIOPSY, LEFT BREAST SEED GUIDED EXCISIONAL BIOPSY;  Surgeon: Rolm Bookbinder, MD;  Location: [place];  Service: General;  Laterality: Left;  . CESAREAN SECTION     2x  . CHOLECYSTECTOMY  2008  . HEMORRHOID SURGERY  2007  . INGUINAL HERNIA REPAIR  1968  . PARTIAL HYSTERECTOMY   1996  . PORT-A-CATH REMOVAL Right 12/15/2017   Procedure: REMOVAL PORT-A-CATH;  Surgeon: Rolm Bookbinder, MD;  Location: [place];  Service: General;  Laterality: Right;  . PORTACATH PLACEMENT Right 08/05/2017   Procedure: INSERTION PORT-A-CATH WITH [place];  Surgeon: Rolm Bookbinder, MD;  Location: [place];  Service: General;  Laterality: Right;  . SHOULDER SURGERY Right 2005   torn muscle  . TONSILLECTOMY      There were no vitals filed for this visit.  Subjective Assessment - 07/25/18 0855    Subjective  I felt good after the last visit but I have been having some soreness every time I do the bands. But overall seeming to be doing well.     Pertinent History  Left breast cancer diagnosed Feb. 28, 2019, Had chemo but had to stop because counts went down and couldn't get back up, then had lumpectomy with 3 nodes removed. She says she had to have drainage x 3(?seroma?) She then had radiation. completed 03/04/2018.  Past history inclused breast cacner on right side 21 years ago with lumpectomy with no nodes but did have radiation with some intemittent soreness since, also had right shoulder surgery  pasat histroy includes lower back problems and tightness in upper back  Has osteoporosis.  She will  be starting ReClast ( once a year infusion) Has acoustic nueroma with hearing loss and occasional balance impairment . she reports collapsed vertebrae cervical 5,6,7    Patient Stated Goals  to learn what to do to help her shoulder get back 100% arm movement,     Currently in Pain?  No/denies         [facility] PT Assessment - 07/25/18 0001      AROM   Right Shoulder Flexion  160 Degrees   with Lt hand pulling right arm up but can hold there   Right Shoulder ABduction  150 Degrees   pt reports feeling sore from new exercises   Right Shoulder Internal Rotation  53 Degrees    Left Shoulder Flexion  152 Degrees    Left Shoulder ABduction  149 Degrees    Left Shoulder  Internal Rotation  64 Degrees                   [facility] Adult PT Treatment/Exercise - 07/25/18 0001      Shoulder Exercises: Pulleys   Flexion  1 minute    Flexion Limitations  VCs to hold stretch    ABduction  1 minute      Shoulder Exercises: Therapy Ball   Flexion  Both;10 reps   Forward lean into end of stretch   Flexion Limitations  Pt returned therapist demo    ABduction  Right;Left;5 reps   Same side lean into end of stretch   ABduction Limitations  VCs to hold stretch      Shoulder Exercises: ROM/Strengthening   Other ROM/Strengthening Exercises  Modified downward dog on wall 5x, 5 sec holds      Manual Therapy   Manual Therapy  Myofascial release;Passive ROM;Manual Lymphatic Drainage (MLD)    Myofascial Release  To Lt axilla during P/ROM and UE pulling throughout    Manual Lymphatic Drainage (MLD)  short neck, superficial and deep abdominals, left inguinal nodes. left axillo-inguinal anastamosis, left breast and axilla moving down toward anastomosis    Passive ROM  In Supine to Lt shoulder into flexion, abduction and D2; then same to Rt                  PT Long Term Goals - 07/13/18 1158      PT LONG TERM GOAL #1   Title  Pt will report the sharp shooting pains in her breast are decreased by 50%    Time  4    Period  Weeks    Status  New      PT LONG TERM GOAL #2   Title  Pt will state that she is able to turn her head while driving easier by 32%     Time  4    Period  Weeks    Status  New      PT LONG TERM GOAL #3   Title  Pt will be educated on strength ABC program and osteoporosis exercise     Time  4    Period  Weeks    Status  New      PT LONG TERM GOAL #4   Title  Pt will have increase in left arm circumference by at 10 cm proximal to olecranon by .5 cm indicating an improvment in left upper arm muscle mass     Time  4    Period  Weeks    Status  New      PT LONG TERM GOAL #  5   Title  Pt will decrease  Quick DASH score to <  40 indicating an improvment in function of left UE    Baseline  54.44    Time  4    Period  Weeks    Status  New            Plan - 07/25/18 0934    Clinical Impression Statement  Continued with manual therapy to include manual lymph drainage and P/ROM. Began session with AA/ROM stretching which pt also tolerated well. She reports feeling looser and less sore by end of session. Also showed pt how to use 2 tennis balls in end o fsock for self mobs to subocciput where she reports she has tightness and HA's frequently.     Rehab Potential  Good    Clinical Impairments Affecting Rehab Potential  osteoporosis, previous radiation, scolosis with muscle imbalances     PT Frequency  2x / week    PT Duration  4 weeks    PT Treatment/Interventions  ADLs/Self Care Home Management;Therapeutic activities;Patient/family education;Manual lymph drainage;Therapeutic exercise;Manual techniques;Passive range of motion;Compression bandaging;Scar mobilization    PT Next Visit Plan  Cont shoulder ROM cont. MLD to left axilla and lateral trunk with soft tissue work to left pecs and soft tissue work to bilateral upper traps, back and posterior axilla.  ROM to neck and shoulders, progress to stretngthening and osteoporosis exercise    Consulted and Agree with Plan of Care  Patient       Patient will benefit from skilled therapeutic intervention in order to improve the following deficits and impairments:  Increased fascial restricitons, Pain, Postural dysfunction, Decreased strength, Decreased range of motion, Impaired UE functional use, Impaired perceived functional ability, Increased edema  Visit Diagnosis: Disorder of the skin and subcutaneous tissue related to radiation, unspecified  Stiffness of left shoulder, not elsewhere classified  Muscle weakness (generalized)  Abnormal posture  Aftercare following surgery for neoplasm  Breast pain  Unsteadiness on feet     Problem List Patient Active  Problem List   Diagnosis Date Noted  . Chemotherapy induced neutropenia ([place]) 10/05/2017  . Thrush, oral 10/05/2017  . Port-A-Cath in place 08/23/2017  . Genetic testing 08/03/2017  . Family history of breast cancer 07/27/2017  . Ductal carcinoma in situ (DCIS) of right breast 07/27/2017  . Schwannoma of cranial nerve ([place]) 07/27/2017  . Family history of prostate cancer   . Family history of ovarian cancer   . Family history of pancreatic cancer   . History of colon polyps   . Tinnitus of left ear 08/12/2011  . Nailbed injury 08/12/2011  . Sensorineural hearing loss, unilateral 08/12/2011  . Malignant neoplasm of upper-outer quadrant of left breast in female, estrogen receptor negative ([place]) 10/07/2007  . ADENOMATOUS COLONIC POLYP 10/07/2007  . HEMORRHOIDS 10/07/2007  . GERD 10/07/2007  . GASTRITIS 10/07/2007  . INGUINAL HERNIA 10/07/2007  . CHOLELITHIASIS 10/07/2007  . ARTHRITIS 10/07/2007    Otelia Limes, PTA 07/25/2018, 9:37 AM  [place], [place], [address] Phone: [phone]   Fax:  [phone]  Name: Stephanie Bautista MRN: [id number] Date of Birth: 28-Aug-1950

## 2018-07-27 ENCOUNTER — Ambulatory Visit: Payer: Medicare Other | Admitting: Physical Therapy

## 2018-07-27 ENCOUNTER — Encounter: Payer: Self-pay | Admitting: Physical Therapy

## 2018-07-27 DIAGNOSIS — Z483 Aftercare following surgery for neoplasm: Secondary | ICD-10-CM | POA: Diagnosis not present

## 2018-07-27 DIAGNOSIS — L599 Disorder of the skin and subcutaneous tissue related to radiation, unspecified: Secondary | ICD-10-CM | POA: Diagnosis not present

## 2018-07-27 DIAGNOSIS — M6281 Muscle weakness (generalized): Secondary | ICD-10-CM

## 2018-07-27 DIAGNOSIS — N644 Mastodynia: Secondary | ICD-10-CM

## 2018-07-27 DIAGNOSIS — R293 Abnormal posture: Secondary | ICD-10-CM | POA: Diagnosis not present

## 2018-07-27 DIAGNOSIS — M25612 Stiffness of left shoulder, not elsewhere classified: Secondary | ICD-10-CM | POA: Diagnosis not present

## 2018-07-27 NOTE — Therapy (Signed)
[place], [place], [address] Phone: [phone]   Fax:  [phone]  Physical Therapy Treatment  Patient Details  Name: Stephanie Bautista MRN: [id number] Date of Birth: 11-21-1950 Referring Provider (PT): Wilber Bihari    Encounter Date: 07/27/2018  PT End of Session - 07/27/18 6644    Visit Number  6    Number of Visits  9    Date for PT Re-Evaluation  08/11/18    PT Start Time  0347    PT Stop Time  1430    PT Time Calculation (min)  45 min    Activity Tolerance  Patient tolerated treatment well    Behavior During Therapy  [facility] for tasks assessed/performed       Past Medical History:  Diagnosis Date  . Acoustic neuroma ([place])   . Adenomatous colon polyp 12/2002  . Arthritis   . Breast cancer ([place]) 1996   right breast cancer in 1996, left breast cancer in 2019  . Cholelithiases   . Esophageal stricture   . Family history of breast cancer   . Family history of ovarian cancer   . Family history of pancreatic cancer   . Family history of prostate cancer   . GERD (gastroesophageal reflux disease)   . Hemorrhoids   . Hiatal hernia   . History of colon polyps   . History of radiation therapy 02/07/18-03/04/2018   Left Breast/ 40.05 Gy in 15 fractions, Left Breast boost/ 10 Gy in 5 fractions.   . Inguinal hernia   . UTI (lower urinary tract infection)     Past Surgical History:  Procedure Laterality Date  . BREAST LUMPECTOMY    . BREAST LUMPECTOMY WITH RADIOACTIVE SEED AND SENTINEL LYMPH NODE BIOPSY Left 12/15/2017   Procedure: LEFT BREAST LUMPECTOMY WITH RADIOACTIVE SEED AND LEFT SENTINEL LYMPH NODE BIOPSY, LEFT BREAST SEED GUIDED EXCISIONAL BIOPSY;  Surgeon: Rolm Bookbinder, MD;  Location: [place];  Service: General;  Laterality: Left;  . CESAREAN SECTION     2x  . CHOLECYSTECTOMY  2008  . HEMORRHOID SURGERY  2007  . INGUINAL HERNIA REPAIR  1968  . PARTIAL HYSTERECTOMY   1996  . PORT-A-CATH REMOVAL Right 12/15/2017   Procedure: REMOVAL PORT-A-CATH;  Surgeon: Rolm Bookbinder, MD;  Location: [place];  Service: General;  Laterality: Right;  . PORTACATH PLACEMENT Right 08/05/2017   Procedure: INSERTION PORT-A-CATH WITH [place];  Surgeon: Rolm Bookbinder, MD;  Location: [place];  Service: General;  Laterality: Right;  . SHOULDER SURGERY Right 2005   torn muscle  . TONSILLECTOMY      There were no vitals filed for this visit.  Subjective Assessment - 07/27/18 1356    Subjective  Pt feels some soreness in her left axilla and chest     Pertinent History  Left breast cancer diagnosed Feb. 28, 2019, Had chemo but had to stop because counts went down and couldn't get back up, then had lumpectomy with 3 nodes removed. She says she had to have drainage x 3(?seroma?) She then had radiation. completed 03/04/2018.  Past history inclused breast cacner on right side 21 years ago with lumpectomy with no nodes but did have radiation with some intemittent soreness since, also had right shoulder surgery  pasat histroy includes lower back problems and tightness in upper back  Has osteoporosis.  She will be starting ReClast ( once a year infusion) Has acoustic nueroma with hearing loss and occasional balance  impairment . she reports collapsed vertebrae cervical 5,6,7    Patient Stated Goals  to learn what to do to help her shoulder get back 100% arm movement,     Currently in Pain?  Yes    Pain Score  4     Pain Location  Axilla    Pain Orientation  Left                       [facility] Adult PT Treatment/Exercise - 07/27/18 0001      Exercises   Exercises  Shoulder      Shoulder Exercises: Sidelying   External Rotation  AROM;Left;20 reps    External Rotation Limitations  no pain, pt able to have full ROM    ABduction  AROM;Left;5 reps      Manual Therapy   Manual Therapy  Myofascial release;Passive ROM;Manual Lymphatic Drainage  (MLD)    Myofascial Release  To Lt axilla during P/ROM and UE pulling throughout    Manual Lymphatic Drainage (MLD)  short neck, superficial and deep abdominals, left inguinal nodes. left axillo-inguinal anastamosis, left breast and axilla moving down toward anastomosis    Passive ROM  In Supine to Lt shoulder into flexion, abduction and D2; then same to Rt                  PT Long Term Goals - 07/13/18 1158      PT LONG TERM GOAL #1   Title  Pt will report the sharp shooting pains in her breast are decreased by 50%    Time  4    Period  Weeks    Status  New      PT LONG TERM GOAL #2   Title  Pt will state that she is able to turn her head while driving easier by 41%     Time  4    Period  Weeks    Status  New      PT LONG TERM GOAL #3   Title  Pt will be educated on strength ABC program and osteoporosis exercise     Time  4    Period  Weeks    Status  New      PT LONG TERM GOAL #4   Title  Pt will have increase in left arm circumference by at 10 cm proximal to olecranon by .5 cm indicating an improvment in left upper arm muscle mass     Time  4    Period  Weeks    Status  New      PT LONG TERM GOAL #5   Title  Pt will decrease  Quick DASH score to < 40 indicating an improvment in function of left UE    Baseline  54.44    Time  4    Period  Weeks    Status  New            Plan - 07/27/18 1555    Clinical Impression Statement  Pt continues to have tighness and tenderness in left lateral and anterior chest that responds to soft tissue work  Upgraded to large dotted foam to wear inside bra at tight areas of breast as pt reports the she can tell a differenct when she does not have the small dotted foam in, she thinks more compression might be better.  She is interested in learning exercises to do at [employer] .     Clinical Impairments Affecting  Rehab Potential  osteoporosis, previous radiation, scolosis with muscle imbalances     PT Frequency  2x / week     PT Duration  4 weeks    PT Next Visit Plan  consider teaching Strength ABC programl pior to discharge . Cont shoulder ROM cont. MLD to left axilla and lateral trunk with soft tissue work to left pecs and soft tissue work to bilateral upper traps, back and posterior axilla.  ROM to neck and shoulders, progress to stretngthening and osteoporosis exercise       Patient will benefit from skilled therapeutic intervention in order to improve the following deficits and impairments:  Increased fascial restricitons, Pain, Postural dysfunction, Decreased strength, Decreased range of motion, Impaired UE functional use, Impaired perceived functional ability, Increased edema  Visit Diagnosis: Disorder of the skin and subcutaneous tissue related to radiation, unspecified  Stiffness of left shoulder, not elsewhere classified  Muscle weakness (generalized)  Abnormal posture  Aftercare following surgery for neoplasm  Breast pain     Problem List Patient Active Problem List   Diagnosis Date Noted  . Chemotherapy induced neutropenia ([place]) 10/05/2017  . Thrush, oral 10/05/2017  . Port-A-Cath in place 08/23/2017  . Genetic testing 08/03/2017  . Family history of breast cancer 07/27/2017  . Ductal carcinoma in situ (DCIS) of right breast 07/27/2017  . Schwannoma of cranial nerve ([place]) 07/27/2017  . Family history of prostate cancer   . Family history of ovarian cancer   . Family history of pancreatic cancer   . History of colon polyps   . Tinnitus of left ear 08/12/2011  . Nailbed injury 08/12/2011  . Sensorineural hearing loss, unilateral 08/12/2011  . Malignant neoplasm of upper-outer quadrant of left breast in female, estrogen receptor negative ([place]) 10/07/2007  . ADENOMATOUS COLONIC POLYP 10/07/2007  . HEMORRHOIDS 10/07/2007  . GERD 10/07/2007  . GASTRITIS 10/07/2007  . INGUINAL HERNIA 10/07/2007  . CHOLELITHIASIS 10/07/2007  . ARTHRITIS 10/07/2007   Donato Heinz. Owens Shark PT  Norwood Levo 07/27/2018, 3:59 PM  [place], [place], [address] Phone: [phone]   Fax:  [phone]  Name: Stephanie Bautista MRN: [id number] Date of Birth: Apr 26, 1951

## 2018-08-01 ENCOUNTER — Other Ambulatory Visit: Payer: Self-pay

## 2018-08-01 ENCOUNTER — Encounter: Payer: Self-pay | Admitting: Physical Therapy

## 2018-08-01 ENCOUNTER — Ambulatory Visit: Payer: Medicare Other | Admitting: Physical Therapy

## 2018-08-01 DIAGNOSIS — L599 Disorder of the skin and subcutaneous tissue related to radiation, unspecified: Secondary | ICD-10-CM | POA: Diagnosis not present

## 2018-08-01 DIAGNOSIS — M25612 Stiffness of left shoulder, not elsewhere classified: Secondary | ICD-10-CM | POA: Diagnosis not present

## 2018-08-01 DIAGNOSIS — Z483 Aftercare following surgery for neoplasm: Secondary | ICD-10-CM | POA: Diagnosis not present

## 2018-08-01 DIAGNOSIS — N644 Mastodynia: Secondary | ICD-10-CM | POA: Diagnosis not present

## 2018-08-01 DIAGNOSIS — M6281 Muscle weakness (generalized): Secondary | ICD-10-CM | POA: Diagnosis not present

## 2018-08-01 DIAGNOSIS — R293 Abnormal posture: Secondary | ICD-10-CM | POA: Diagnosis not present

## 2018-08-01 NOTE — Therapy (Signed)
[place], [place], [address] Phone: [phone]   Fax:  [phone]  Physical Therapy Treatment  Patient Details  Name: Stephanie Bautista MRN: [id number] Date of Birth: 09/11/1950 Referring Provider (PT): Wilber Bihari    Encounter Date: 08/01/2018  PT End of Session - 08/01/18 1217    Visit Number  7    Number of Visits  9    Date for PT Re-Evaluation  08/11/18    PT Start Time  0810    PT Stop Time  0847    PT Time Calculation (min)  37 min    Activity Tolerance  Patient tolerated treatment well    Behavior During Therapy  [facility] for tasks assessed/performed       Past Medical History:  Diagnosis Date  . Acoustic neuroma ([place])   . Adenomatous colon polyp 12/2002  . Arthritis   . Breast cancer ([place]) 1996   right breast cancer in 1996, left breast cancer in 2019  . Cholelithiases   . Esophageal stricture   . Family history of breast cancer   . Family history of ovarian cancer   . Family history of pancreatic cancer   . Family history of prostate cancer   . GERD (gastroesophageal reflux disease)   . Hemorrhoids   . Hiatal hernia   . History of colon polyps   . History of radiation therapy 02/07/18-03/04/2018   Left Breast/ 40.05 Gy in 15 fractions, Left Breast boost/ 10 Gy in 5 fractions.   . Inguinal hernia   . UTI (lower urinary tract infection)     Past Surgical History:  Procedure Laterality Date  . BREAST LUMPECTOMY    . BREAST LUMPECTOMY WITH RADIOACTIVE SEED AND SENTINEL LYMPH NODE BIOPSY Left 12/15/2017   Procedure: LEFT BREAST LUMPECTOMY WITH RADIOACTIVE SEED AND LEFT SENTINEL LYMPH NODE BIOPSY, LEFT BREAST SEED GUIDED EXCISIONAL BIOPSY;  Surgeon: Rolm Bookbinder, MD;  Location: [place];  Service: General;  Laterality: Left;  . CESAREAN SECTION     2x  . CHOLECYSTECTOMY  2008  . HEMORRHOID SURGERY  2007  . INGUINAL HERNIA REPAIR  1968  . PARTIAL HYSTERECTOMY   1996  . PORT-A-CATH REMOVAL Right 12/15/2017   Procedure: REMOVAL PORT-A-CATH;  Surgeon: Rolm Bookbinder, MD;  Location: [place];  Service: General;  Laterality: Right;  . PORTACATH PLACEMENT Right 08/05/2017   Procedure: INSERTION PORT-A-CATH WITH [place];  Surgeon: Rolm Bookbinder, MD;  Location: [place];  Service: General;  Laterality: Right;  . SHOULDER SURGERY Right 2005   torn muscle  . TONSILLECTOMY      There were no vitals filed for this visit.  Subjective Assessment - 08/01/18 0809    Subjective  Thursday I had this pain. I have been having sharp pain.    Pertinent History  Left breast cancer diagnosed Feb. 28, 2019, Had chemo but had to stop because counts went down and couldn't get back up, then had lumpectomy with 3 nodes removed. She says she had to have drainage x 3(?seroma?) She then had radiation. completed 03/04/2018.  Past history inclused breast cacner on right side 21 years ago with lumpectomy with no nodes but did have radiation with some intemittent soreness since, also had right shoulder surgery  pasat histroy includes lower back problems and tightness in upper back  Has osteoporosis.  She will be starting ReClast ( once a year infusion) Has acoustic nueroma with hearing loss and occasional balance  impairment . she reports collapsed vertebrae cervical 5,6,7    Patient Stated Goals  to learn what to do to help her shoulder get back 100% arm movement,     Currently in Pain?  Yes    Pain Score  4     Pain Location  Axilla    Pain Orientation  Left                       [facility] Adult PT Treatment/Exercise - 08/01/18 0001      Manual Therapy   Soft tissue mobilization  to left pec in area of increased tightness and left lateral trunk and lateral breast all of which have numerous "knots" and pt is very tender, this did improve especially in pec by end of session    Passive ROM  In Supine to Lt shoulder into flexion,  abduction and D2; then same to Rt                  PT Long Term Goals - 07/13/18 1158      PT LONG TERM GOAL #1   Title  Pt will report the sharp shooting pains in her breast are decreased by 50%    Time  4    Period  Weeks    Status  New      PT LONG TERM GOAL #2   Title  Pt will state that she is able to turn her head while driving easier by 98%     Time  4    Period  Weeks    Status  New      PT LONG TERM GOAL #3   Title  Pt will be educated on strength ABC program and osteoporosis exercise     Time  4    Period  Weeks    Status  New      PT LONG TERM GOAL #4   Title  Pt will have increase in left arm circumference by at 10 cm proximal to olecranon by .5 cm indicating an improvment in left upper arm muscle mass     Time  4    Period  Weeks    Status  New      PT LONG TERM GOAL #5   Title  Pt will decrease  Quick DASH score to < 40 indicating an improvment in function of left UE    Baseline  54.44    Time  4    Period  Weeks    Status  New            Plan - 08/01/18 1217    Clinical Impression Statement  Pt had more sharp pain in left breast area that lasted for longer periods over the weekend. Pt has numerous trigger points in area of serratus and lateral trunk and lateral breast. Performed soft tissue  mobilization to these areas today to decrease pain. Pt very sensitive but some improvement noted by end of session.     Rehab Potential  Good    Clinical Impairments Affecting Rehab Potential  osteoporosis, previous radiation, scolosis with muscle imbalances     PT Frequency  2x / week    PT Duration  4 weeks    PT Treatment/Interventions  ADLs/Self Care Home Management;Therapeutic activities;Patient/family education;Manual lymph drainage;Therapeutic exercise;Manual techniques;Passive range of motion;Compression bandaging;Scar mobilization    PT Next Visit Plan  consider teaching Strength ABC programl pior to discharge . Cont shoulder ROM cont. MLD  to  left axilla and lateral trunk with soft tissue work to left pecs and soft tissue work to bilateral upper traps, back and posterior axilla.  ROM to neck and shoulders, progress to stretngthening and osteoporosis exercise    Consulted and Agree with Plan of Care  Patient       Patient will benefit from skilled therapeutic intervention in order to improve the following deficits and impairments:  Increased fascial restricitons, Pain, Postural dysfunction, Decreased strength, Decreased range of motion, Impaired UE functional use, Impaired perceived functional ability, Increased edema  Visit Diagnosis: Disorder of the skin and subcutaneous tissue related to radiation, unspecified  Stiffness of left shoulder, not elsewhere classified     Problem List Patient Active Problem List   Diagnosis Date Noted  . Chemotherapy induced neutropenia ([place]) 10/05/2017  . Thrush, oral 10/05/2017  . Port-A-Cath in place 08/23/2017  . Genetic testing 08/03/2017  . Family history of breast cancer 07/27/2017  . Ductal carcinoma in situ (DCIS) of right breast 07/27/2017  . Schwannoma of cranial nerve ([place]) 07/27/2017  . Family history of prostate cancer   . Family history of ovarian cancer   . Family history of pancreatic cancer   . History of colon polyps   . Tinnitus of left ear 08/12/2011  . Nailbed injury 08/12/2011  . Sensorineural hearing loss, unilateral 08/12/2011  . Malignant neoplasm of upper-outer quadrant of left breast in female, estrogen receptor negative ([place]) 10/07/2007  . ADENOMATOUS COLONIC POLYP 10/07/2007  . HEMORRHOIDS 10/07/2007  . GERD 10/07/2007  . GASTRITIS 10/07/2007  . INGUINAL HERNIA 10/07/2007  . CHOLELITHIASIS 10/07/2007  . ARTHRITIS 10/07/2007    Allyson Sabal [facility] 08/01/2018, 12:19 PM  [place], [place], [address] Phone: [phone]   Fax:  [phone]  Name: Areya B Brunet MRN:  [id number] Date of Birth: [birth year]  Allyson Sabal [place], PT 08/01/18 12:19 PM

## 2018-08-02 ENCOUNTER — Encounter: Payer: Medicare Other | Admitting: Physical Therapy

## 2018-08-04 ENCOUNTER — Encounter: Payer: Self-pay | Admitting: Physical Therapy

## 2018-08-04 ENCOUNTER — Other Ambulatory Visit: Payer: Self-pay

## 2018-08-04 ENCOUNTER — Ambulatory Visit: Payer: Medicare Other | Admitting: Physical Therapy

## 2018-08-04 DIAGNOSIS — L599 Disorder of the skin and subcutaneous tissue related to radiation, unspecified: Secondary | ICD-10-CM | POA: Diagnosis not present

## 2018-08-04 DIAGNOSIS — N644 Mastodynia: Secondary | ICD-10-CM

## 2018-08-04 DIAGNOSIS — Z483 Aftercare following surgery for neoplasm: Secondary | ICD-10-CM

## 2018-08-04 DIAGNOSIS — M25612 Stiffness of left shoulder, not elsewhere classified: Secondary | ICD-10-CM

## 2018-08-04 DIAGNOSIS — M6281 Muscle weakness (generalized): Secondary | ICD-10-CM

## 2018-08-04 DIAGNOSIS — R293 Abnormal posture: Secondary | ICD-10-CM

## 2018-08-04 NOTE — Therapy (Addendum)
[place], [place], [address] Phone: [phone]   Fax:  [phone]  Physical Therapy Treatment  Patient Details  Name: Stephanie Bautista MRN: [id number] Date of Birth: [birth year] Referring Provider (PT): Wilber Bihari    Encounter Date: 08/04/2018  PT End of Session - 08/04/18 1741    Visit Number  8    Number of Visits  9    Date for PT Re-Evaluation  08/11/18    PT Start Time  1430    PT Stop Time  1515    PT Time Calculation (min)  45 min    Activity Tolerance  Patient tolerated treatment well    Behavior During Therapy  [facility] for tasks assessed/performed       Past Medical History:  Diagnosis Date  . Acoustic neuroma ([place])   . Adenomatous colon polyp 12/2002  . Arthritis   . Breast cancer ([place]) 1996   right breast cancer in 1996, left breast cancer in 2019  . Cholelithiases   . Esophageal stricture   . Family history of breast cancer   . Family history of ovarian cancer   . Family history of pancreatic cancer   . Family history of prostate cancer   . GERD (gastroesophageal reflux disease)   . Hemorrhoids   . Hiatal hernia   . History of colon polyps   . History of radiation therapy 02/07/18-03/04/2018   Left Breast/ 40.05 Gy in 15 fractions, Left Breast boost/ 10 Gy in 5 fractions.   . Inguinal hernia   . UTI (lower urinary tract infection)     Past Surgical History:  Procedure Laterality Date  . BREAST LUMPECTOMY    . BREAST LUMPECTOMY WITH RADIOACTIVE SEED AND SENTINEL LYMPH NODE BIOPSY Left 12/15/2017   Procedure: LEFT BREAST LUMPECTOMY WITH RADIOACTIVE SEED AND LEFT SENTINEL LYMPH NODE BIOPSY, LEFT BREAST SEED GUIDED EXCISIONAL BIOPSY;  Surgeon: Rolm Bookbinder, MD;  Location: [place];  Service: General;  Laterality: Left;  . CESAREAN SECTION     2x  . CHOLECYSTECTOMY  2008  . HEMORRHOID SURGERY  2007  . INGUINAL HERNIA REPAIR  1968  . PARTIAL HYSTERECTOMY   1996  . PORT-A-CATH REMOVAL Right 12/15/2017   Procedure: REMOVAL PORT-A-CATH;  Surgeon: Rolm Bookbinder, MD;  Location: [place];  Service: General;  Laterality: Right;  . PORTACATH PLACEMENT Right 08/05/2017   Procedure: INSERTION PORT-A-CATH WITH [place];  Surgeon: Rolm Bookbinder, MD;  Location: [place];  Service: General;  Laterality: Right;  . SHOULDER SURGERY Right 2005   torn muscle  . TONSILLECTOMY      There were no vitals filed for this visit.  Subjective Assessment - 08/04/18 1441    Subjective  Pt had significant pain after last treatment, but is feeling much better today .  She is still "touchy" today below her breast     Pertinent History  Left breast cancer diagnosed Feb. 28, 2019, Had chemo but had to stop because counts went down and couldn't get back up, then had lumpectomy with 3 nodes removed. She says she had to have drainage x 3(?seroma?) She then had radiation. completed 03/04/2018.  Past history inclused breast cacner on right side 21 years ago with lumpectomy with no nodes but did have radiation with some intemittent soreness since, also had right shoulder surgery  pasat histroy includes lower back problems and tightness in upper back  Has osteoporosis.  She will be starting ReClast (  once a year infusion) Has acoustic nueroma with hearing loss and occasional balance impairment . she reports collapsed vertebrae cervical 5,6,7    Patient Stated Goals  to learn what to do to help her shoulder get back 100% arm movement,     Currently in Pain?  Yes    Pain Score  1     Pain Location  Breast   deep in the breast                       [facility] Adult PT Treatment/Exercise - 08/04/18 0001      Shoulder Exercises: Standing   Row  Strengthening;Right;Left;10 reps;Theraband    Theraband Level (Shoulder Row)  Level 2 (Red)      Shoulder Exercises: Isometric Strengthening   Flexion  5X5"    Extension  5X5"    ABduction  5X5"       Manual Therapy   Manual Lymphatic Drainage (MLD)  short neck, superficial and deep abdominals, left inguinal nodes. left axillo-inguinal anastamosis, left breast and axilla moving down toward anastomosis    Passive ROM  in sidelying, left shoulder abduction and circle while holding pec major tendon gently in between fingers for gentle compression                   PT Long Term Goals - 07/13/18 1158      PT LONG TERM GOAL #1   Title  Pt will report the sharp shooting pains in her breast are decreased by 50%    Time  4    Period  Weeks    Status  New      PT LONG TERM GOAL #2   Title  Pt will state that she is able to turn her head while driving easier by 37%     Time  4    Period  Weeks    Status  New      PT LONG TERM GOAL #3   Title  Pt will be educated on strength ABC program and osteoporosis exercise     Time  4    Period  Weeks    Status  New      PT LONG TERM GOAL #4   Title  Pt will have increase in left arm circumference by at 10 cm proximal to olecranon by .5 cm indicating an improvment in left upper arm muscle mass     Time  4    Period  Weeks    Status  New      PT LONG TERM GOAL #5   Title  Pt will decrease  Quick DASH score to < 40 indicating an improvment in function of left UE    Baseline  54.44    Time  4    Period  Weeks    Status  New            Plan - 08/04/18 1741    Clinical Impression Statement  Pt continues to have pain in left breast with swelling and tightness especially in pec muscle.  She is not able to tolerate compression.  She developed muscle soreness and trigger points after  theraband exercises.  Downgraded exercise to isometrics today as she still needs to increase upper quadrant stretngth and worked gently on soft tissue today     PT Next Visit Plan  teach biceps and shoulder 3 ways for HEP . Cont shoulder ROM cont. MLD to left axilla and lateral  trunk with soft tissue work to left pecs and soft tissue work to  bilateral upper traps, back and posterior axilla.  ROM to neck and shoulders, progress to stretngthening and osteoporosis exercise       Patient will benefit from skilled therapeutic intervention in order to improve the following deficits and impairments:  Increased fascial restricitons, Pain, Postural dysfunction, Decreased strength, Decreased range of motion, Impaired UE functional use, Impaired perceived functional ability, Increased edema  Visit Diagnosis: Disorder of the skin and subcutaneous tissue related to radiation, unspecified  Stiffness of left shoulder, not elsewhere classified  Muscle weakness (generalized)  Abnormal posture  Aftercare following surgery for neoplasm  Breast pain     Problem List Patient Active Problem List   Diagnosis Date Noted  . Chemotherapy induced neutropenia ([place]) 10/05/2017  . Thrush, oral 10/05/2017  . Port-A-Cath in place 08/23/2017  . Genetic testing 08/03/2017  . Family history of breast cancer 07/27/2017  . Ductal carcinoma in situ (DCIS) of right breast 07/27/2017  . Schwannoma of cranial nerve ([place]) 07/27/2017  . Family history of prostate cancer   . Family history of ovarian cancer   . Family history of pancreatic cancer   . History of colon polyps   . Tinnitus of left ear 08/12/2011  . Nailbed injury 08/12/2011  . Sensorineural hearing loss, unilateral 08/12/2011  . Malignant neoplasm of upper-outer quadrant of left breast in female, estrogen receptor negative ([place]) 10/07/2007  . ADENOMATOUS COLONIC POLYP 10/07/2007  . HEMORRHOIDS 10/07/2007  . GERD 10/07/2007  . GASTRITIS 10/07/2007  . INGUINAL HERNIA 10/07/2007  . CHOLELITHIASIS 10/07/2007  . ARTHRITIS 10/07/2007   Donato Heinz. Owens Shark, PT  [facility] 08/04/2018, [place], [place], [address] Phone: [phone]   Fax:  [phone]  Name: Stephanie Bautista MRN:  [id number] Date of Birth: 1950-11-10  PHYSICAL THERAPY DISCHARGE SUMMARY  Visits from Start of Care: 8  Current functional level related to goals / functional outcomes: unknown  Remaining deficits: unknown   Education / Equipment: Home exercise, self care Plan: Patient agrees to discharge.  Patient goals were partially met. Patient is being discharged due to not returning since the last visit.  ?????    Maudry Diego, PT 01/19/19 12:45 PM

## 2018-08-04 NOTE — Patient Instructions (Signed)
Flexion (Isometric)      Cancer Rehab [phone]    Press right fist against wall. Hold __5__ seconds. Repeat _5-10___ times. Do __1-2__ sessions per day.  SHOULDER: Abduction (Isometric)    Use wall as resistance. Press arm against pillow. Hold _5__ seconds. _5-10__ times. Do _1-2__ sessions per day.    External Rotation (Isometric)    Place back of left fist against door frame, with elbow bent. Press fist against door frame. Hold __5__ seconds. Repeat _5-10___ times. Do _1-2___ sessions per day.  Extension (Isometric)    Place left bent elbow and back of arm against wall. Press elbow against wall. Hold __5__ seconds. Repeat _5-10___ times. Do _1-2___ sessions per day.    Strengthening: Resisted Extension    Hold tubing in left hand, arm forward. Pull arm back, elbow straight. Repeat __5-10__ times per set. Do __1-2__ sessions per day.

## 2018-08-26 DIAGNOSIS — D333 Benign neoplasm of cranial nerves: Secondary | ICD-10-CM | POA: Diagnosis not present

## 2018-08-26 DIAGNOSIS — H9042 Sensorineural hearing loss, unilateral, left ear, with unrestricted hearing on the contralateral side: Secondary | ICD-10-CM | POA: Diagnosis not present

## 2018-08-26 DIAGNOSIS — Z510 Encounter for antineoplastic radiation therapy: Secondary | ICD-10-CM | POA: Diagnosis not present

## 2018-09-06 ENCOUNTER — Telehealth: Payer: Self-pay | Admitting: Oncology

## 2018-09-06 NOTE — Telephone Encounter (Signed)
R/s appt per 4/13 sch message - unable to reach patient - left message and mailed letter with appt date and time

## 2018-09-12 ENCOUNTER — Other Ambulatory Visit: Payer: Medicare Other

## 2018-09-12 ENCOUNTER — Ambulatory Visit: Payer: Medicare Other | Admitting: Oncology

## 2018-10-07 DIAGNOSIS — D352 Benign neoplasm of pituitary gland: Secondary | ICD-10-CM | POA: Diagnosis not present

## 2018-10-25 ENCOUNTER — Encounter: Payer: Self-pay | Admitting: Adult Health

## 2018-10-31 ENCOUNTER — Inpatient Hospital Stay (HOSPITAL_BASED_OUTPATIENT_CLINIC_OR_DEPARTMENT_OTHER): Payer: Medicare Other | Admitting: Oncology

## 2018-10-31 ENCOUNTER — Other Ambulatory Visit: Payer: Self-pay

## 2018-10-31 ENCOUNTER — Inpatient Hospital Stay: Payer: Medicare Other | Attending: Adult Health

## 2018-10-31 VITALS — BP 161/73 | HR 74 | Temp 98.5°F | Resp 16 | Ht 63.0 in | Wt 125.8 lb

## 2018-10-31 DIAGNOSIS — C50412 Malignant neoplasm of upper-outer quadrant of left female breast: Secondary | ICD-10-CM

## 2018-10-31 DIAGNOSIS — Z86000 Personal history of in-situ neoplasm of breast: Secondary | ICD-10-CM

## 2018-10-31 DIAGNOSIS — Z171 Estrogen receptor negative status [ER-]: Secondary | ICD-10-CM | POA: Insufficient documentation

## 2018-10-31 DIAGNOSIS — R42 Dizziness and giddiness: Secondary | ICD-10-CM | POA: Insufficient documentation

## 2018-10-31 DIAGNOSIS — D0511 Intraductal carcinoma in situ of right breast: Secondary | ICD-10-CM

## 2018-10-31 DIAGNOSIS — K5900 Constipation, unspecified: Secondary | ICD-10-CM

## 2018-10-31 DIAGNOSIS — M810 Age-related osteoporosis without current pathological fracture: Secondary | ICD-10-CM | POA: Diagnosis not present

## 2018-10-31 DIAGNOSIS — R51 Headache: Secondary | ICD-10-CM | POA: Insufficient documentation

## 2018-10-31 LAB — COMPREHENSIVE METABOLIC PANEL
ALT: 8 U/L (ref 0–44)
AST: 14 U/L — ABNORMAL LOW (ref 15–41)
Albumin: 3.9 g/dL (ref 3.5–5.0)
Alkaline Phosphatase: 83 U/L (ref 38–126)
Anion gap: 10 (ref 5–15)
BUN: 11 mg/dL (ref 8–23)
CO2: 25 mmol/L (ref 22–32)
Calcium: 9.2 mg/dL (ref 8.9–10.3)
Chloride: 108 mmol/L (ref 98–111)
Creatinine, Ser: 0.82 mg/dL (ref 0.44–1.00)
GFR calc Af Amer: >60 mL/min (ref >60)
GFR calc non Af Amer: >60 mL/min (ref >60)
Glucose, Bld: 82 mg/dL (ref 70–99)
Potassium: 3.7 mmol/L (ref 3.5–5.1)
Sodium: 143 mmol/L (ref 135–145)
Total Bilirubin: 0.5 mg/dL (ref 0.3–1.2)
Total Protein: 7.2 g/dL (ref 6.5–8.1)

## 2018-10-31 LAB — CBC WITH DIFFERENTIAL/PLATELET
Abs Immature Granulocytes: 0.01 10*3/uL (ref 0.00–0.07)
Basophils Absolute: 0 10*3/uL (ref 0.0–0.1)
Basophils Relative: 1 %
Eosinophils Absolute: 0.1 10*3/uL (ref 0.0–0.5)
Eosinophils Relative: 2 %
HCT: 42.7 % (ref 36.0–46.0)
Hemoglobin: 13.7 g/dL (ref 12.0–15.0)
Immature Granulocytes: 0 %
Lymphocytes Relative: 31 %
Lymphs Abs: 1.2 10*3/uL (ref 0.7–4.0)
MCH: 28.5 pg (ref 26.0–34.0)
MCHC: 32.1 g/dL (ref 30.0–36.0)
MCV: 88.8 fL (ref 80.0–100.0)
Monocytes Absolute: 0.3 10*3/uL (ref 0.1–1.0)
Monocytes Relative: 7 %
Neutro Abs: 2.3 10*3/uL (ref 1.7–7.7)
Neutrophils Relative %: 59 %
Platelets: 185 10*3/uL (ref 150–400)
RBC: 4.81 MIL/uL (ref 3.87–5.11)
RDW: 13.2 % (ref 11.5–15.5)
WBC: 4 10*3/uL (ref 4.0–10.5)
nRBC: 0 % (ref 0.0–0.2)

## 2018-10-31 NOTE — Progress Notes (Signed)
Stephanie Bautista  [phone] [phone]     ID: Stephanie Bautista DOB: 07-25-1950  MR#: [id number]  MBT#:[id number]  Patient Care Team: Stephanie Pao, MD as PCP - General (Internal Medicine) Stephanie Bautista, Stephanie Dad, MD as Consulting Physician (Oncology) Stephanie Bookbinder, MD as Consulting Physician (General Surgery) Stephanie Fus, MD as Consulting Physician (Obstetrics and Gynecology) Stephanie Rend, MD as Consulting Physician (Endocrinology) Stephanie Chol, MD as Consulting Physician (Sports Medicine) Stephanie Dana, MD as Referring Physician (Otolaryngology) Stephanie Gibson, MD as Attending Physician (Radiation Oncology) Stephanie Quint, MD as Referring Physician (Radiology) OTHER MD:  CHIEF COMPLAINT: Triple negative breast cancer  CURRENT TREATMENT: observation   INTERVAL HISTORY: Stephanie Bautista is here today for follow up of her triple negative breast cancer. She continues on observation.  Since her last visit, she underwent bilateral diagnostic mammography with tomography at [facility] on 06/28/2018 showing: breast density category B; no evidence of malignancy in either breast.  She also underwent gamma knife radiosurgery for left sided vestibular neuroma on 08/26/2018. She reports this went well. She states some soreness from the helmet she wore during the surgery. She notes some continued headaches and dizziness, but these are slowly improving. She states her hearing is at 40% on the left-- she has been told she will lose her hearing in that ear good review of systems possibly in a few years but is hoping that that will not occur.    REVIEW OF SYSTEMS: Stephanie Bautista reports walking for exercise. She has been doing projects around the house. She notes these projects have strained her back, for which she uses an ice pack, a heating pad, and tylenol. She reports some continued shooting pains from surgery, as expected. She also reports some right groin pain, similar to the  shooting pains in her breast. She does note constipation.  The patient denies visual changes, nausea, vomiting, stiff neck, or gait imbalance. There has been no cough, phlegm production, or pleurisy, no chest pain or pressure, and no change in bladder habits. The patient denies fever, rash, bleeding, unexplained fatigue or unexplained weight loss. A detailed review of systems was otherwise entirely negative.   HISTORY OF CURRENT ILLNESS: From the original intake note:  I saw Stephanie Bautista remotely for her history of ductal carcinoma in situ of the right breast, treated with lumpectomy 09/02/1994, with the pathology showing ([id number]) ductal carcinoma in situ.  There was no invasive component.  This was followed by radiation given between 09/21/94 and 11/04/94, with a total of 53646 centigray to the right breast and a total of 604 0 cGy to the tumor bed.  She notes that she didn't take any anti-estrogen therapy at that time.  More recently, Stephanie Bautista had routine screening mammography at [facility] on 07/16/2017 showing a possible abnormality in the left breast.  The breast density was category B.  She underwent left breast ultrasound on 07/16/2017 showing: a 1.5 cm irregular mass in the left breast upper outer quadrant posterior depth is consistent with carcinoma. A 6 mm irregular mass in the left breast upper outer quadrant posterior depth was also highly suggestive for malignancy. A lymph node in the left axillary tail was read as suspicious of malignancy.   Accordingly on 07/22/2017 she proceeded to biopsy of the left breast area in question as well as the suspicious left axillary lymph node. The pathology from this procedure showed ([id number]): Lymph node, needle/core biopsy, left axilla with no evidence of carcinoma-- concordant. Breast, left, needle core biopsy,  invasive ductal carcinoma, grade 3, prognostic indicators significant for: ER, 0% negative and PR, 0% negative. Proliferation marker Ki67 at 50%. HER2  negative signals ratio is 1.36, and the number per cell 1.70 Breast, left, needle core biopsy, architectural distortion consistent with fibroadenoma--discordant.  Bilateral breast MRI on 07/28/2017 showed breast density category B. There was a 2.4 x 1.4 x 1.2 cm. enhancing mass in the posterior third of the upper-outer quadrant of the left breast corresponding with the biopsy proven invasive ductal carcinoma. No enlarged adenopathy is visualized.  The patient's subsequent history is as detailed below.   PAST MEDICAL HISTORY: Past Medical History:  Diagnosis Date   Acoustic neuroma (Stephanie Bautista)    Adenomatous colon polyp 12/2002   Arthritis    Breast cancer ([place]) 1996   right breast cancer in 1996, left breast cancer in 2019   Cholelithiases    Esophageal stricture    Family history of breast cancer    Family history of ovarian cancer    Family history of pancreatic cancer    Family history of prostate cancer    GERD (gastroesophageal reflux disease)    Hemorrhoids    Hiatal hernia    History of colon polyps    History of radiation therapy 02/07/18-03/04/2018   Left Breast/ 40.05 Gy in 15 fractions, Left Breast boost/ 10 Gy in 5 fractions.    Inguinal hernia    UTI (lower urinary tract infection)   As far as PMHx she has had intermittent back pain (spinal stenosis) x 2 years,  Osteoporosis, random migraines, early cataracts and early hear loss due to schwannoma.    PAST SURGICAL HISTORY: Past Surgical History:  Procedure Laterality Date   BREAST LUMPECTOMY     BREAST LUMPECTOMY WITH RADIOACTIVE SEED AND SENTINEL LYMPH NODE BIOPSY Left 12/15/2017   Procedure: LEFT BREAST LUMPECTOMY WITH RADIOACTIVE SEED AND LEFT SENTINEL LYMPH NODE BIOPSY, LEFT BREAST SEED GUIDED EXCISIONAL BIOPSY;  Surgeon: Stephanie Bookbinder, MD;  Location: [facility];  Service: General;  Laterality: Left;   CESAREAN SECTION     2x   CHOLECYSTECTOMY  2008   HEMORRHOID SURGERY  2007     [place]   PARTIAL HYSTERECTOMY  1996   PORT-A-CATH REMOVAL Right 12/15/2017   Procedure: REMOVAL PORT-A-CATH;  Surgeon: Stephanie Bookbinder, MD;  Location: [facility];  Service: General;  Laterality: Right;   PORTACATH PLACEMENT Right 08/05/2017   Procedure: [place] WITH [place];  Surgeon: Stephanie Bookbinder, MD;  Location: [facility];  Service: General;  Laterality: Right;   SHOULDER SURGERY Right 2005   torn muscle   TONSILLECTOMY      FAMILY HISTORY Family History  Problem Relation Age of Onset   Heart disease Mother    Melanoma Mother        dx in her 56s   Ovarian cancer Mother 31   Heart disease Father    Diabetes Father    Stroke Father    Ovarian cancer Sister 61   Pancreatic cancer Maternal Aunt    Bone cancer Maternal Uncle    Breast cancer Paternal Aunt    Non-Hodgkin's lymphoma Maternal Grandmother    Heart disease Maternal Grandfather    Heart disease Paternal Grandmother    Prostate cancer Paternal Grandfather    Stroke Maternal Uncle    Bone cancer Maternal Uncle    Cancer Maternal Aunt        NOS   Breast cancer Cousin  daughter of mat aunt with NOS cancer  Her mother died from CHF at age 10. Her father died from cardiac issues at age 79. She doesn't have any brothers, but has 3 sisters. She had one sister and her mother with ovarian cancer. Her sister was age 6 when she was diagnosed with ovarian cancer and her mother was in her late 74's when she was diagnosed with ovarian cancer. Her sister was not tested genetically following this. Her maternal grandmother had non-hodgkins lymphoma. Her paternal grandfather had prostate cancer. Her paternal aunt had breast cancer. Her paternal aunt's daughter had breast cancer. She had several uncles that had prostate cancer that spread to the bones.    GYNECOLOGIC HISTORY:  No LMP recorded. Patient has had a hysterectomy. Menarche:  68 years old Age at first live birth: 68 years old [place] P2 LMP: hysterectomy at age 71 S/p Hysterectomy with USO (L)   SOCIAL HISTORY: She has been retired from Medical sales representative work since 05/26/2017. Her husband, Kasandra Knudsen has been retired from the [employer] for the past 9 years. Their son is Leroy Sea is 62 and a IT trainer. Their daughter, Adonis Brook lives in [facility], is 42 and works in child care at [employer]. The patient has 6 grandchildren ( 2 boys and 4 girls). She is a Psychologist, forensic.     ADVANCED DIRECTIVES:    HEALTH MAINTENANCE: Social History   Tobacco Use   Smoking status: Never Smoker   Smokeless tobacco: Never Used  Substance Use Topics   Alcohol use: No   Drug use: No     Colonoscopy: Dr. Fuller Plan.   PAP: Dr Nori Riis  Bone density: at Dr. Verlon Au office/ osteoporosis.    Allergies  Allergen Reactions   Amoxicillin Itching and Rash    Current Outpatient Medications  Medication Sig Dispense Refill   acetaminophen (TYLENOL) 500 MG tablet Take 500 mg by mouth every 6 (six) hours as needed.     omeprazole (PRILOSEC) 40 MG capsule Take 1 capsule (40 mg total) by mouth 2 (two) times daily. (Patient taking differently: Take 40 mg by mouth daily. ) 60 capsule 11   No current facility-administered medications for this visit.     OBJECTIVE: Middle-aged white woman in no acute distress  Vitals:   10/31/18 1131  BP: (!) 161/73  Pulse: 74  Resp: 16  Temp: 98.5 F (36.9 C)  SpO2: 100%     Body mass index is 22.28 kg/m.   Wt Readings from Last 3 Encounters:  10/31/18 125 lb 12.8 oz (57.1 kg)  07/07/18 127 lb 1.6 oz (57.7 kg)  04/08/18 125 lb 6 oz (56.9 kg)  ECOG FS:1 - Symptomatic but completely ambulatory  Sclerae unicteric, EOMs intact Wearing a mask No cervical or supraclavicular adenopathy Lungs no rales or rhonchi Heart regular rate and rhythm Abd soft including deep palpation in the right lower quadrant, nontender, positive bowel sounds, no mass  palpated MSK no focal spinal tenderness, no upper extremity lymphedema Neuro: nonfocal, well oriented, appropriate affect Breasts: Status post lumpectomy and radiation.  There is no evidence of local recurrence.  The cosmetic result is good.  Left breast is benign.  Both axillae are benign  LAB RESULTS:  CMP     Component Value Date/Time   NA 143 10/31/2018 1106   K 3.7 10/31/2018 1106   CL 108 10/31/2018 1106   CO2 25 10/31/2018 1106   GLUCOSE 82 10/31/2018 1106   BUN 11 10/31/2018 1106   CREATININE  0.82 10/31/2018 1106   CREATININE 0.81 07/27/2017 1007   CREATININE 0.69 11/16/2011 1054   CALCIUM 9.2 10/31/2018 1106   PROT 7.2 10/31/2018 1106   ALBUMIN 3.9 10/31/2018 1106   AST 14 (L) 10/31/2018 1106   AST 16 07/27/2017 1007   ALT 8 10/31/2018 1106   ALT 12 07/27/2017 1007   ALKPHOS 83 10/31/2018 1106   BILITOT 0.5 10/31/2018 1106   BILITOT 0.6 07/27/2017 1007   GFRNONAA >60 10/31/2018 1106   GFRNONAA >60 07/27/2017 1007   GFRAA >60 10/31/2018 1106   GFRAA >60 07/27/2017 1007    No results found for: TOTALPROTELP, ALBUMINELP, A1GS, A2GS, BETS, BETA2SER, GAMS, MSPIKE, SPEI  No results found for: Nils Pyle, [facility]  Lab Results  Component Value Date   WBC 4.0 10/31/2018   NEUTROABS 2.3 10/31/2018   HGB 13.7 10/31/2018   HCT 42.7 10/31/2018   MCV 88.8 10/31/2018   PLT 185 10/31/2018    _0 @  No results found for: LABCA2  No components found for: MWNUUV253  No results for input(s): INR in the last 168 hours.  No results found for: LABCA2  No results found for: GUY403  No results found for: KVQ259  No results found for: DGL875  No results found for: CA2729  No components found for: HGQUANT  No results found for: CEA1 / No results found for: CEA1   No results found for: AFPTUMOR  No results found for: CHROMOGRNA  No results found for: PSA1  Appointment on 10/31/2018  Component Date Value Ref Range Status   Sodium  10/31/2018 143  135 - 145 mmol/L Final   Potassium 10/31/2018 3.7  3.5 - 5.1 mmol/L Final   Chloride 10/31/2018 108  98 - 111 mmol/L Final   CO2 10/31/2018 25  22 - 32 mmol/L Final   Glucose, Bld 10/31/2018 82  70 - 99 mg/dL Final   BUN 10/31/2018 11  8 - 23 mg/dL Final   Creatinine, Ser 10/31/2018 0.82  0.44 - 1.00 mg/dL Final   Calcium 10/31/2018 9.2  8.9 - 10.3 mg/dL Final   Total Protein 10/31/2018 7.2  6.5 - 8.1 g/dL Final   Albumin 10/31/2018 3.9  3.5 - 5.0 g/dL Final   AST 10/31/2018 14* 15 - 41 U/L Final   ALT 10/31/2018 8  0 - 44 U/L Final   Alkaline Phosphatase 10/31/2018 83  38 - 126 U/L Final   Total Bilirubin 10/31/2018 0.5  0.3 - 1.2 mg/dL Final   GFR calc non Af Amer 10/31/2018 >60  >60 mL/min Final   GFR calc Af Amer 10/31/2018 >60  >60 mL/min Final   Anion gap 10/31/2018 10  5 - 15 Final   Performed at [facility], [place] [address] [place], [place] [address]   WBC 10/31/2018 4.0  4.0 - 10.5 K/uL Final   RBC 10/31/2018 4.81  3.87 - 5.11 MIL/uL Final   Hemoglobin 10/31/2018 13.7  12.0 - 15.0 g/dL Final   HCT 10/31/2018 42.7  36.0 - 46.0 % Final   MCV 10/31/2018 88.8  80.0 - 100.0 fL Final   MCH 10/31/2018 28.5  26.0 - 34.0 pg Final   MCHC 10/31/2018 32.1  30.0 - 36.0 g/dL Final   RDW 10/31/2018 13.2  11.5 - 15.5 % Final   Platelets 10/31/2018 185  150 - 400 K/uL Final   nRBC 10/31/2018 0.0  0.0 - 0.2 % Final   Neutrophils Relative % 10/31/2018 59  % Final   Neutro  Abs 10/31/2018 2.3  1.7 - 7.7 K/uL Final   Lymphocytes Relative 10/31/2018 31  % Final   Lymphs Abs 10/31/2018 1.2  0.7 - 4.0 K/uL Final   Monocytes Relative 10/31/2018 7  % Final   Monocytes Absolute 10/31/2018 0.3  0.1 - 1.0 K/uL Final   Eosinophils Relative 10/31/2018 2  % Final   Eosinophils Absolute 10/31/2018 0.1  0.0 - 0.5 K/uL Final   Basophils Relative 10/31/2018 1  % Final   Basophils Absolute 10/31/2018 0.0  0.0 - 0.1 K/uL Final    Immature Granulocytes 10/31/2018 0  % Final   Abs Immature Granulocytes 10/31/2018 0.01  0.00 - 0.07 K/uL Final   Performed at [facility], [address] [place], [place] [address]    (this displays the last labs from the last 3 days)  No results found for: TOTALPROTELP, ALBUMINELP, A1GS, A2GS, BETS, BETA2SER, GAMS, MSPIKE, SPEI (this displays SPEP labs)  No results found for: KPAFRELGTCHN, LAMBDASER, KAPLAMBRATIO (kappa/lambda light chains)  No results found for: HGBA, HGBA2QUANT, HGBFQUANT, HGBSQUAN (Hemoglobinopathy evaluation)   No results found for: LDH  No results found for: IRON, TIBC, IRONPCTSAT (Iron and TIBC)  No results found for: FERRITIN  Urinalysis    Component Value Date/Time   BILIRUBINUR neg 04/06/2014 1019   PROTEINUR neg 04/06/2014 1019   UROBILINOGEN 0.2 04/06/2014 1019   NITRITE neg 04/06/2014 1019   LEUKOCYTESUR Negative 04/06/2014 1019     STUDIES: No results found.   ELIGIBLE FOR AVAILABLE RESEARCH PROTOCOL: no  ASSESSMENT: 68 y.o. [place] woman  (1) status post right lumpectomy 09/02/1994 for ductal carcinoma in situ  (a) status post adjuvant radiation 09/21/1994 through 11/04/1994, total 5040 centigrade to the breast and 604 0 cGy to the tumor bed  (b) did not receive adjuvant antiestrogens  (2) status post left breast upper outer quadrant biopsy 07/22/2017 for a clinical T1c pN0, stage IB invasive ductal carcinoma, grade 3, triple negative, with an MIB-1 of 50%.  (a) lymph node biopsy on the same day was negative for malignancy (concordant)  (b) a second area of architectural distortion was also biopsied, read as fibroadenoma (discordant)  (c) a third area of architectural distortion has not yet been biopsied  (d) breast MRI 07/28/2017 shows a clinical T2 N0 tumor  (3) genetics testing 08/02/2017 through the Multi-Gene Panel offered by [facility] found no deleterious mutations in ALK, APC, ATM, AXIN2,BAP1,   BARD1, BLM, BMPR1A, BRCA1, BRCA2, BRIP1, CASR, CDC73, CDH1, CDK4, CDKN1B, CDKN1C, CDKN2A (p14ARF), CDKN2A (p16INK4a), CEBPA, CHEK2, CTNNA1, DICER1, DIS3L2, EGFR (c.2369C>T, p.Thr790Met variant only), EPCAM (Deletion/duplication testing only), FH, FLCN, GATA2, GPC3, GREM1 (Promoter region deletion/duplication testing only), HOXB13 (c.251G>A, p.Gly84Glu), HRAS, KIT, MAX, MEN1, MET, MITF (c.952G>A, p.Glu318Lys variant only), MLH1, MSH2, MSH3, MSH6, MUTYH, NBN, NF1, NF2, NTHL1, PALB2, PDGFRA, PHOX2B, PMS2, POLD1, POLE, POT1, PRKAR1A, PTCH1, PTEN, RAD50, RAD51C, RAD51D, RB1, RECQL4, RET, RUNX1, SDHAF2, SDHA (sequence changes only), SDHB, SDHC, SDHD, SMAD4, SMARCA4, SMARCB1, SMARCE1, STK11, SUFU, TERT, TERT, TMEM127, TP53, TSC1, TSC2, VHL, WRN and WT1.  The report date is August 02, 2017.  (a) WT1 c.332C>T VUS identified on the Multi-cancer gene panel.    (4) chemotherapy consisting of doxorubicin/ cyclophosphamide in dose dense fashion x4 started 08/24/2017, completed 10/12/2017,  followed by paclitaxel/ carboplatin weekly   (a) carboplatin/paclitaxel discontinued after 3 doses, stopped 11/23/2017, due to cytopenias  (b) post chemo MRI 12/04/2017 findings a complete imaging response  (5) status post left lumpectomy and sentinel lymph node sampling 12/15/2017 showing  a complete pathologic response (ypT0, ypN0)  (6) not a candidate for SWOG J0964 study (post-op pembrolizumab)   (7) adjuvant radiation 02/07/18 through 03/04/2018:   Left breast 40.05 Gy in 15 fractions, boost: 10 Gy in 5 fractions   PLAN: Virjean is just about a year out from definitive surgery for her breast cancer, with no evidence of disease recurrence.  This is favorable as triple negative breast cancer if it is going to recur tends to recur early  She did very well with her radiosurgery at [facility] and is being closely followed there by Dr. Vallarie Mare.  Hopefully she will be able to keep her hearing long-term  She is taking appropriate  pandemic precautions and exercising appropriately  I reassured her that the discomfort she occasionally feels in the right breast is benign and does not indicate recurrent breast cancer  I am not sure what she is feeling in the right lower quadrant.  The palpation today in that area showed absolutely no abnormality and no tenderness.  She does have a history of constipation and she is not taking stool softeners.  I encouraged her to do that  Otherwise she will return to see me in 1 year, shortly after her next mammogram  She knows to call before that visit if any problems develop  Sarajane Jews C. Arnice Vanepps, MD 10/31/18 11:55 AM Medical Oncology and Hematology [facility] [address] [place], [place] [address] Tel. [phone]    Fax. [phone]   I, Wilburn Mylar, am acting as scribe for Dr. Virgie Bautista. Tysha Grismore.  I, Lurline Del MD, have reviewed the above documentation for accuracy and completeness, and I agree with the above.

## 2018-11-01 ENCOUNTER — Telehealth: Payer: Self-pay | Admitting: Oncology

## 2018-11-01 NOTE — Telephone Encounter (Signed)
Left a message regarding schedule

## 2018-12-02 ENCOUNTER — Encounter: Payer: Self-pay | Admitting: Oncology

## 2019-02-08 DIAGNOSIS — M545 Low back pain: Secondary | ICD-10-CM | POA: Diagnosis not present

## 2019-02-28 DIAGNOSIS — M5136 Other intervertebral disc degeneration, lumbar region: Secondary | ICD-10-CM | POA: Diagnosis not present

## 2019-02-28 DIAGNOSIS — M47816 Spondylosis without myelopathy or radiculopathy, lumbar region: Secondary | ICD-10-CM | POA: Diagnosis not present

## 2019-02-28 DIAGNOSIS — M545 Low back pain: Secondary | ICD-10-CM | POA: Diagnosis not present

## 2019-03-01 DIAGNOSIS — M810 Age-related osteoporosis without current pathological fracture: Secondary | ICD-10-CM | POA: Diagnosis not present

## 2019-03-01 DIAGNOSIS — E559 Vitamin D deficiency, unspecified: Secondary | ICD-10-CM | POA: Diagnosis not present

## 2019-03-01 DIAGNOSIS — R5383 Other fatigue: Secondary | ICD-10-CM | POA: Diagnosis not present

## 2019-03-01 DIAGNOSIS — M47816 Spondylosis without myelopathy or radiculopathy, lumbar region: Secondary | ICD-10-CM | POA: Diagnosis not present

## 2019-03-03 DIAGNOSIS — Z90710 Acquired absence of both cervix and uterus: Secondary | ICD-10-CM | POA: Diagnosis not present

## 2019-03-03 DIAGNOSIS — Z853 Personal history of malignant neoplasm of breast: Secondary | ICD-10-CM | POA: Diagnosis not present

## 2019-03-03 DIAGNOSIS — M810 Age-related osteoporosis without current pathological fracture: Secondary | ICD-10-CM | POA: Diagnosis not present

## 2019-03-03 DIAGNOSIS — Z8262 Family history of osteoporosis: Secondary | ICD-10-CM | POA: Diagnosis not present

## 2019-03-03 DIAGNOSIS — D333 Benign neoplasm of cranial nerves: Secondary | ICD-10-CM | POA: Diagnosis not present

## 2019-03-07 DIAGNOSIS — R2689 Other abnormalities of gait and mobility: Secondary | ICD-10-CM | POA: Diagnosis not present

## 2019-03-07 DIAGNOSIS — D333 Benign neoplasm of cranial nerves: Secondary | ICD-10-CM | POA: Diagnosis not present

## 2019-03-07 DIAGNOSIS — H918X2 Other specified hearing loss, left ear: Secondary | ICD-10-CM | POA: Diagnosis not present

## 2019-03-07 DIAGNOSIS — Z923 Personal history of irradiation: Secondary | ICD-10-CM | POA: Diagnosis not present

## 2019-03-09 DIAGNOSIS — M810 Age-related osteoporosis without current pathological fracture: Secondary | ICD-10-CM | POA: Diagnosis not present

## 2019-03-20 DIAGNOSIS — M545 Low back pain: Secondary | ICD-10-CM | POA: Diagnosis not present

## 2019-03-22 DIAGNOSIS — H903 Sensorineural hearing loss, bilateral: Secondary | ICD-10-CM | POA: Diagnosis not present

## 2019-03-28 DIAGNOSIS — M47816 Spondylosis without myelopathy or radiculopathy, lumbar region: Secondary | ICD-10-CM | POA: Diagnosis not present

## 2019-03-28 DIAGNOSIS — M545 Low back pain: Secondary | ICD-10-CM | POA: Diagnosis not present

## 2019-04-03 DIAGNOSIS — Z87442 Personal history of urinary calculi: Secondary | ICD-10-CM | POA: Diagnosis not present

## 2019-04-03 DIAGNOSIS — K219 Gastro-esophageal reflux disease without esophagitis: Secondary | ICD-10-CM | POA: Diagnosis not present

## 2019-04-03 DIAGNOSIS — Z923 Personal history of irradiation: Secondary | ICD-10-CM | POA: Diagnosis not present

## 2019-04-03 DIAGNOSIS — M810 Age-related osteoporosis without current pathological fracture: Secondary | ICD-10-CM | POA: Diagnosis not present

## 2019-04-03 DIAGNOSIS — Z853 Personal history of malignant neoplasm of breast: Secondary | ICD-10-CM | POA: Diagnosis not present

## 2019-04-03 DIAGNOSIS — D352 Benign neoplasm of pituitary gland: Secondary | ICD-10-CM | POA: Diagnosis not present

## 2019-04-11 DIAGNOSIS — Z86018 Personal history of other benign neoplasm: Secondary | ICD-10-CM | POA: Diagnosis not present

## 2019-04-11 DIAGNOSIS — D2261 Melanocytic nevi of right upper limb, including shoulder: Secondary | ICD-10-CM | POA: Diagnosis not present

## 2019-04-11 DIAGNOSIS — D225 Melanocytic nevi of trunk: Secondary | ICD-10-CM | POA: Diagnosis not present

## 2019-04-11 DIAGNOSIS — D2221 Melanocytic nevi of right ear and external auricular canal: Secondary | ICD-10-CM | POA: Diagnosis not present

## 2019-04-11 DIAGNOSIS — Z23 Encounter for immunization: Secondary | ICD-10-CM | POA: Diagnosis not present

## 2019-04-11 DIAGNOSIS — L723 Sebaceous cyst: Secondary | ICD-10-CM | POA: Diagnosis not present

## 2019-04-11 DIAGNOSIS — Z808 Family history of malignant neoplasm of other organs or systems: Secondary | ICD-10-CM | POA: Diagnosis not present

## 2019-04-11 DIAGNOSIS — L731 Pseudofolliculitis barbae: Secondary | ICD-10-CM | POA: Diagnosis not present

## 2019-04-11 DIAGNOSIS — R208 Other disturbances of skin sensation: Secondary | ICD-10-CM | POA: Diagnosis not present

## 2019-04-11 DIAGNOSIS — D224 Melanocytic nevi of scalp and neck: Secondary | ICD-10-CM | POA: Diagnosis not present

## 2019-04-12 DIAGNOSIS — M47816 Spondylosis without myelopathy or radiculopathy, lumbar region: Secondary | ICD-10-CM | POA: Diagnosis not present

## 2019-04-12 DIAGNOSIS — M545 Low back pain: Secondary | ICD-10-CM | POA: Diagnosis not present

## 2019-05-04 DIAGNOSIS — M4316 Spondylolisthesis, lumbar region: Secondary | ICD-10-CM | POA: Diagnosis not present

## 2019-05-04 DIAGNOSIS — M545 Low back pain: Secondary | ICD-10-CM | POA: Diagnosis not present

## 2019-06-19 ENCOUNTER — Ambulatory Visit: Payer: Medicare Other | Attending: Internal Medicine

## 2019-06-19 DIAGNOSIS — Z23 Encounter for immunization: Secondary | ICD-10-CM

## 2019-06-19 NOTE — Progress Notes (Signed)
   Covid-19 Vaccination Clinic  Name:  Delissa B Kight    MRN: [id number] DOB: 1950/09/25  06/19/2019  Ms. Lardner was observed post Covid-19 immunization for 15 minutes without incidence. She was provided with Vaccine Information Sheet and instruction to access the V-Safe system.   Ms. Rando was instructed to call 911 with any severe reactions post vaccine: Marland Kitchen Difficulty breathing  . Swelling of your face and throat  . A fast heartbeat  . A bad rash all over your body  . Dizziness and weakness    Immunizations Administered    Name Date Dose VIS Date Route   Pfizer COVID-19 Vaccine 06/19/2019  3:46 PM 0.3 mL 05/05/2019 Intramuscular   Manufacturer: [place]   Lot: [id number]   [place]: [id number]

## 2019-07-07 ENCOUNTER — Ambulatory Visit: Payer: Medicare Other

## 2019-07-10 ENCOUNTER — Ambulatory Visit: Payer: Medicare Other | Attending: Internal Medicine

## 2019-07-10 DIAGNOSIS — Z23 Encounter for immunization: Secondary | ICD-10-CM

## 2019-07-10 NOTE — Progress Notes (Signed)
   Covid-19 Vaccination Clinic  Name:  Stephanie Bautista    MRN: [id number] DOB: [birth year]  07/10/2019  [name] was observed post Covid-19 immunization for 15 minutes without incidence. She was provided with Vaccine Information Sheet and instruction to access the V-Safe system.   [name] was instructed to call 911 with any severe reactions post vaccine: Marland Kitchen Difficulty breathing  . Swelling of your face and throat  . A fast heartbeat  . A bad rash all over your body  . Dizziness and weakness    Immunizations Administered    Name Date Dose VIS Date Route   Pfizer COVID-19 Vaccine 07/10/2019 12:30 PM 0.3 mL 05/05/2019 Intramuscular   Manufacturer: [place]   Lot: [id number]   [place]: [id number]

## 2019-07-20 DIAGNOSIS — C50412 Malignant neoplasm of upper-outer quadrant of left female breast: Secondary | ICD-10-CM | POA: Diagnosis not present

## 2019-08-13 NOTE — Progress Notes (Signed)
[place]  [phone] [phone]     ID: Stephanie Bautista DOB: Oct 02, 1950  MR#: [id number]  OEH#:[id number]  Patient Care Team: Haywood Pao, MD as PCP - General (Internal Medicine) Maelys Kinnick, Virgie Dad, MD as Consulting Physician (Oncology) Rolm Bookbinder, MD as Consulting Physician (General Surgery) Maisie Fus, MD as Consulting Physician (Obstetrics and Gynecology) Delrae Rend, MD as Consulting Physician (Endocrinology) Verner Chol, MD as Consulting Physician (Sports Medicine) Lavonia Dana, MD as Referring Physician (Otolaryngology) Eppie Gibson, MD as Attending Physician (Radiation Oncology) Georgina Quint, MD as Referring Physician (Radiology) OTHER MD:  CHIEF COMPLAINT: Triple negative breast cancer  CURRENT TREATMENT: observation   INTERVAL HISTORY: Stephanie Bautista is here today for follow up of her triple negative breast cancer. She continues on observation.  She had her Covid vaccine shots about 5 weeks ago and as a result has not yet had her mammography.  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    REVIEW OF SYSTEMS: Stephanie Bautista tells me since she had her gamma knife treatment for the schwannoma she has had some cognitive dysfunction issues.  Mostly this is name recall.  She also has had some balance problems which are not getting better.  She does have an appointment at [facility] in the near future to discuss that further.  Aside from that detailed review of systems today was stable.   HISTORY OF CURRENT ILLNESS: From the original intake note:  I saw Stephanie Bautista remotely for her history of ductal carcinoma in situ of the right breast, treated with lumpectomy 09/02/1994, with the pathology showing ([id number]) ductal carcinoma in situ.   There was no invasive component.  This was followed by radiation given between 09/21/94 and 11/04/94, with a [place] 03704 centigray to the right breast and a total of 604 0 cGy to the tumor bed.  She notes that she didn't take any anti-estrogen therapy at that time.  More recently, Stephanie Bautista had routine screening mammography at [facility] on 07/16/2017 showing a possible abnormality in the left breast.  The breast density was category B.  She underwent left breast ultrasound on 07/16/2017 showing: a 1.5 cm irregular mass in the left breast upper outer quadrant posterior depth is consistent with carcinoma. A 6 mm irregular mass in the left breast upper outer quadrant posterior depth was also highly suggestive for malignancy. A lymph node in the left axillary tail was read as suspicious of malignancy.   Accordingly on 07/22/2017 she proceeded to biopsy of the left breast area in question as well as the suspicious left axillary lymph node. The pathology from this procedure showed ([id number]): Lymph node, needle/core biopsy, left axilla with no evidence of carcinoma-- concordant. Breast, left, needle core biopsy, invasive ductal carcinoma, grade 3, prognostic indicators significant for: ER, 0% negative and PR, 0% negative. Proliferation marker Ki67 at 50%. HER2 negative signals ratio is 1.36, and the number per cell 1.70 Breast, left, needle core biopsy, architectural distortion consistent with fibroadenoma--discordant.  Bilateral breast MRI on 07/28/2017 showed breast density category B. There was a 2.4 x 1.4 x 1.2 cm. enhancing mass in the posterior third of the upper-outer quadrant of the left breast corresponding with the biopsy proven invasive ductal carcinoma. No enlarged adenopathy is visualized.  The patient's subsequent history is as detailed below.   PAST MEDICAL HISTORY: Past  Medical History:  Diagnosis Date  . Acoustic neuroma ([place])   . Adenomatous colon polyp 12/2002  . Arthritis   . Breast cancer  ([place]) 1996   right breast cancer in 1996, left breast cancer in 2019  . Cholelithiases   . Esophageal stricture   . Family history of breast cancer   . Family history of ovarian cancer   . Family history of pancreatic cancer   . Family history of prostate cancer   . GERD (gastroesophageal reflux disease)   . Hemorrhoids   . Hiatal hernia   . History of colon polyps   . History of radiation therapy 02/07/18-03/04/2018   Left Breast/ 40.05 Gy in 15 fractions, Left Breast boost/ 10 Gy in 5 fractions.   . Inguinal hernia   . UTI (lower urinary tract infection)   As far as PMHx she has had intermittent back pain (spinal stenosis) x 2 years,  Osteoporosis, random migraines, early cataracts and early hear loss due to schwannoma.    PAST SURGICAL HISTORY: Past Surgical History:  Procedure Laterality Date  . BREAST LUMPECTOMY    . BREAST LUMPECTOMY WITH RADIOACTIVE SEED AND SENTINEL LYMPH NODE BIOPSY Left 12/15/2017   Procedure: LEFT BREAST LUMPECTOMY WITH RADIOACTIVE SEED AND LEFT SENTINEL LYMPH NODE BIOPSY, LEFT BREAST SEED GUIDED EXCISIONAL BIOPSY;  Surgeon: Rolm Bookbinder, MD;  Location: [place];  Service: General;  Laterality: Left;  . CESAREAN SECTION     2x  . CHOLECYSTECTOMY  2008  . HEMORRHOID SURGERY  2007  . INGUINAL HERNIA REPAIR  1968  . PARTIAL HYSTERECTOMY  1996  . PORT-A-CATH REMOVAL Right 12/15/2017   Procedure: REMOVAL PORT-A-CATH;  Surgeon: Rolm Bookbinder, MD;  Location: [place];  Service: General;  Laterality: Right;  . PORTACATH PLACEMENT Right 08/05/2017   Procedure: INSERTION PORT-A-CATH WITH [place];  Surgeon: Rolm Bookbinder, MD;  Location: [place];  Service: General;  Laterality: Right;  . SHOULDER SURGERY Right 2005   torn muscle  . TONSILLECTOMY      FAMILY HISTORY Family History  Problem Relation Age of Onset  . Heart disease Mother   . Melanoma Mother        dx in her 23s  . Ovarian cancer  Mother 61  . Heart disease Father   . Diabetes Father   . Stroke Father   . Ovarian cancer Sister 54  . Pancreatic cancer Maternal Aunt   . Bone cancer Maternal Uncle   . Breast cancer Paternal Aunt   . Non-Hodgkin's lymphoma Maternal Grandmother   . Heart disease Maternal Grandfather   . Heart disease Paternal Grandmother   . Prostate cancer Paternal Grandfather   . Stroke Maternal Uncle   . Bone cancer Maternal Uncle   . Cancer Maternal Aunt        NOS  . Breast cancer Cousin        daughter of mat aunt with NOS cancer  Her mother died from CHF at age 84. Her father died from cardiac issues at age 74. She doesn't have any brothers, but has 3 sisters. She had one sister and her mother with ovarian cancer. Her sister was age 73 when she was diagnosed with ovarian cancer and her mother was in her late 92's when she was diagnosed with ovarian cancer. Her sister was not tested genetically following this. Her maternal grandmother had non-hodgkins lymphoma. Her paternal grandfather had prostate cancer. Her paternal aunt had breast cancer. Her paternal aunt's daughter had  breast cancer. She had several uncles that had prostate cancer that spread to the bones.    GYNECOLOGIC HISTORY:  No LMP recorded. Patient has had a hysterectomy. Menarche: 69 years old Age at first live birth: 69 years old [place] P2 LMP: hysterectomy at age 78 S/p Hysterectomy with USO (L)   SOCIAL HISTORY: She has been retired from Medical sales representative work since 05/26/2017. Her husband, Stephanie Bautista has been retired from the [employer] for the past 9 years. Their son is Stephanie Bautista is 24 and a IT trainer. Their daughter, Stephanie Bautista lives in [place], is 39 and works in child care at [employer]. The patient has 6 grandchildren ( 2 boys and 4 girls). She is a Psychologist, forensic.     ADVANCED DIRECTIVES:    HEALTH MAINTENANCE: Social History   Tobacco Use  . Smoking status: Never Smoker  . Smokeless tobacco: Never Used    Substance Use Topics  . Alcohol use: No  . Drug use: No     Colonoscopy: Dr. Fuller Plan.   PAP: Dr Nori Riis  Bone density: at Dr. Verlon Au office/ osteoporosis.    Allergies  Allergen Reactions  . Amoxicillin Itching and Rash    Current Outpatient Medications  Medication Sig Dispense Refill  . acetaminophen (TYLENOL) 500 MG tablet Take 500 mg by mouth every 6 (six) hours as needed.    Marland Kitchen omeprazole (PRILOSEC) 40 MG capsule Take 1 capsule (40 mg total) by mouth 2 (two) times daily. (Patient taking differently: Take 40 mg by mouth daily. ) 60 capsule 11   No current facility-administered medications for this visit.    OBJECTIVE: White Bautista who appears younger than stated age  70:   08/14/19 0945  BP: (!) 149/45  Pulse: 60  Resp: 18  Temp: 97.8 F (36.6 C)  SpO2: 100%     Body mass index is 22.32 kg/m.   Wt Readings from Last 3 Encounters:  08/14/19 126 lb (57.2 kg)  10/31/18 125 lb 12.8 oz (57.1 kg)  07/07/18 127 lb 1.6 oz (57.7 kg)  ECOG FS:1 - Symptomatic but completely ambulatory  Sclerae unicteric, EOMs intact Wearing a mask No cervical or supraclavicular adenopathy Lungs no rales or rhonchi Heart regular rate and rhythm Abd soft, nontender, positive bowel sounds MSK no focal spinal tenderness, no upper extremity lymphedema Neuro: nonfocal, well oriented, appropriate affect Breasts: Status post right lumpectomy and radiation with no evidence of local recurrence.  Left breast is benign.  Both axillae are benign.   LAB RESULTS:  CMP     Component Value Date/Time   NA 140 08/14/2019 0918   K 3.7 08/14/2019 0918   CL 107 08/14/2019 0918   CO2 25 08/14/2019 0918   GLUCOSE 67 (L) 08/14/2019 0918   BUN 13 08/14/2019 0918   CREATININE 0.79 08/14/2019 0918   CREATININE 0.81 07/27/2017 1007   CREATININE 0.69 11/16/2011 1054   CALCIUM 9.1 08/14/2019 0918   PROT 6.7 08/14/2019 0918   ALBUMIN 4.0 08/14/2019 0918   AST 13 (L) 08/14/2019 0918   AST 16 07/27/2017  1007   ALT 8 08/14/2019 0918   ALT 12 07/27/2017 1007   ALKPHOS 76 08/14/2019 0918   BILITOT 0.6 08/14/2019 0918   BILITOT 0.6 07/27/2017 1007   GFRNONAA >60 08/14/2019 0918   GFRNONAA >60 07/27/2017 1007   GFRAA >60 08/14/2019 0918   GFRAA >60 07/27/2017 1007    No results found for: TOTALPROTELP, ALBUMINELP, A1GS, A2GS, BETS, BETA2SER, GAMS, MSPIKE, SPEI  No results  found for: Nils Pyle, [facility]  Lab Results  Component Value Date   WBC 3.9 (L) 08/14/2019   NEUTROABS 2.0 08/14/2019   HGB 13.5 08/14/2019   HCT 41.8 08/14/2019   MCV 91.1 08/14/2019   PLT 172 08/14/2019   No results found for: LABCA2  No components found for: XIPJAS505  No results for input(s): INR in the last 168 hours.  No results found for: LABCA2  No results found for: LZJ673  No results found for: ALP379  No results found for: KWI097  No results found for: CA2729  No components found for: HGQUANT  No results found for: CEA1 / No results found for: CEA1   No results found for: AFPTUMOR  No results found for: CHROMOGRNA  No results found for: HGBA, HGBA2QUANT, HGBFQUANT, HGBSQUAN (Hemoglobinopathy evaluation)   No results found for: LDH  No results found for: IRON, TIBC, IRONPCTSAT (Iron and TIBC)  No results found for: FERRITIN  Urinalysis    Component Value Date/Time   BILIRUBINUR neg 04/06/2014 1019   PROTEINUR neg 04/06/2014 1019   UROBILINOGEN 0.2 04/06/2014 1019   NITRITE neg 04/06/2014 1019   LEUKOCYTESUR Negative 04/06/2014 1019    STUDIES: No results found.   ELIGIBLE FOR AVAILABLE RESEARCH PROTOCOL: no  ASSESSMENT: 25 y.o. Stephanie Bautista  (1) status post right lumpectomy 09/02/1994 for ductal carcinoma in situ  (a) status post adjuvant radiation 09/21/1994 through 11/04/1994, total 5040 centigrade to the breast and 604 0 cGy to the tumor bed  (b) did not receive adjuvant antiestrogens  (2) status post left breast upper outer quadrant  biopsy 07/22/2017 for a clinical T1c pN0, stage IB invasive ductal carcinoma, grade 3, triple negative, with an MIB-1 of 50%.  (a) lymph node biopsy on the same day was negative for malignancy (concordant)  (b) a second area of architectural distortion was also biopsied, read as fibroadenoma (discordant)  (c) a third area of architectural distortion has not yet been biopsied  (d) breast MRI 07/28/2017 shows a clinical T2 N0 tumor  (3) genetics testing 08/02/2017 through the Multi-Gene Panel offered by [facility] found no deleterious mutations in ALK, APC, ATM, AXIN2,BAP1,  BARD1, BLM, BMPR1A, BRCA1, BRCA2, BRIP1, CASR, CDC73, CDH1, CDK4, CDKN1B, CDKN1C, CDKN2A (p14ARF), CDKN2A (p16INK4a), CEBPA, CHEK2, CTNNA1, DICER1, DIS3L2, EGFR (c.2369C>T, p.Thr790Met variant only), EPCAM (Deletion/duplication testing only), FH, FLCN, GATA2, GPC3, GREM1 (Promoter region deletion/duplication testing only), HOXB13 (c.251G>A, p.Gly84Glu), HRAS, KIT, MAX, MEN1, MET, MITF (c.952G>A, p.Glu318Lys variant only), MLH1, MSH2, MSH3, MSH6, MUTYH, NBN, NF1, NF2, NTHL1, PALB2, PDGFRA, PHOX2B, PMS2, POLD1, POLE, POT1, PRKAR1A, PTCH1, PTEN, RAD50, RAD51C, RAD51D, RB1, RECQL4, RET, RUNX1, SDHAF2, SDHA (sequence changes only), SDHB, SDHC, SDHD, SMAD4, SMARCA4, SMARCB1, SMARCE1, STK11, SUFU, TERT, TERT, TMEM127, TP53, TSC1, TSC2, VHL, WRN and WT1.  The report date is August 02, 2017.  (a) WT1 c.332C>T VUS identified on the Multi-cancer gene panel.    (4) chemotherapy consisting of doxorubicin/ cyclophosphamide in dose dense fashion x4 started 08/24/2017, completed 10/12/2017,  followed by paclitaxel/ carboplatin weekly   (a) carboplatin/paclitaxel discontinued after 3 doses, stopped 11/23/2017, due to cytopenias  (b) post chemo MRI 12/04/2017 findings a complete imaging response  (5) status post left lumpectomy and sentinel lymph node sampling 12/15/2017 showing a complete pathologic response (ypT0, ypN0)  (6) not a candidate for SWOG  D5329 study (post-op pembrolizumab)   (7) adjuvant radiation 02/07/18 through 03/04/2018:   Left breast 40.05 Gy in 15 fractions, boost: 10 Gy in 5 fractions    PLAN: Jonah is  a year and a half out from definitive surgery for her breast cancer with no evidence of disease recurrence.  This is favorable.  She understands that because she had a complete pathologic response to neoadjuvant treatment her prognosis as far as breast cancer goes is excellent.  She is behind on mammography because of the Covid vaccine.  She will call to schedule her mammogram in the next couple of weeks.  As far as the balance problems are concerned she was offered rehab by Dr. Azucena Cecil but she cannot really drive to [facility] all the time.  I am referring her to our balance rehab program here.  I have encouraged her to continue to exercise regularly.  I have reassured her that her cognitive dysfunction is very mild and we discussed how she can deal with that at present.  She has some balance issues and she will be discussing those with Dr. Azucena Cecil when she sees her to discuss the recent MRI.  Otherwise Laketra will return to see me April of next year after her next year's mammography  She knows to call for any other issue that may develop before then.  Total encounter time 25 minutes.Sarajane Jews C. Chrissi Crow, MD 08/14/19 10:12 AM Medical Oncology and Hematology [facility] [place], [place] [address] Tel. [phone]    Fax. [phone]   I, Wilburn Mylar, am acting as scribe for Dr. Virgie Dad. Cris Talavera.  I, Lurline Del MD, have reviewed the above documentation for accuracy and completeness, and I agree with the above.   *Total Encounter Time as defined by the Centers for Medicare and Medicaid Services includes, in addition to the face-to-face time of a patient visit (documented in the note above) non-face-to-face time: obtaining and reviewing outside history, ordering  and reviewing medications, tests or procedures, care coordination (communications with other health care professionals or caregivers) and documentation in the medical record.

## 2019-08-14 ENCOUNTER — Inpatient Hospital Stay: Payer: Medicare Other | Attending: Oncology | Admitting: Oncology

## 2019-08-14 ENCOUNTER — Other Ambulatory Visit: Payer: Self-pay

## 2019-08-14 ENCOUNTER — Inpatient Hospital Stay: Payer: Medicare Other

## 2019-08-14 VITALS — BP 149/45 | HR 60 | Temp 97.8°F | Resp 18 | Ht 63.0 in | Wt 126.0 lb

## 2019-08-14 DIAGNOSIS — D0511 Intraductal carcinoma in situ of right breast: Secondary | ICD-10-CM

## 2019-08-14 DIAGNOSIS — M810 Age-related osteoporosis without current pathological fracture: Secondary | ICD-10-CM | POA: Diagnosis not present

## 2019-08-14 DIAGNOSIS — C50412 Malignant neoplasm of upper-outer quadrant of left female breast: Secondary | ICD-10-CM | POA: Diagnosis not present

## 2019-08-14 DIAGNOSIS — Z86000 Personal history of in-situ neoplasm of breast: Secondary | ICD-10-CM | POA: Insufficient documentation

## 2019-08-14 DIAGNOSIS — Z171 Estrogen receptor negative status [ER-]: Secondary | ICD-10-CM | POA: Diagnosis not present

## 2019-08-14 DIAGNOSIS — R4189 Other symptoms and signs involving cognitive functions and awareness: Secondary | ICD-10-CM | POA: Diagnosis not present

## 2019-08-14 LAB — COMPREHENSIVE METABOLIC PANEL
ALT: 8 U/L (ref 0–44)
AST: 13 U/L — ABNORMAL LOW (ref 15–41)
Albumin: 4 g/dL (ref 3.5–5.0)
Alkaline Phosphatase: 76 U/L (ref 38–126)
Anion gap: 8 (ref 5–15)
BUN: 13 mg/dL (ref 8–23)
CO2: 25 mmol/L (ref 22–32)
Calcium: 9.1 mg/dL (ref 8.9–10.3)
Chloride: 107 mmol/L (ref 98–111)
Creatinine, Ser: 0.79 mg/dL (ref 0.44–1.00)
GFR calc Af Amer: >60 mL/min (ref >60)
GFR calc non Af Amer: >60 mL/min (ref >60)
Glucose, Bld: 67 mg/dL — ABNORMAL LOW (ref 70–99)
Potassium: 3.7 mmol/L (ref 3.5–5.1)
Sodium: 140 mmol/L (ref 135–145)
Total Bilirubin: 0.6 mg/dL (ref 0.3–1.2)
Total Protein: 6.7 g/dL (ref 6.5–8.1)

## 2019-08-14 LAB — CBC WITH DIFFERENTIAL/PLATELET
Abs Immature Granulocytes: 0.01 10*3/uL (ref 0.00–0.07)
Basophils Absolute: 0 10*3/uL (ref 0.0–0.1)
Basophils Relative: 1 %
Eosinophils Absolute: 0.1 10*3/uL (ref 0.0–0.5)
Eosinophils Relative: 2 %
HCT: 41.8 % (ref 36.0–46.0)
Hemoglobin: 13.5 g/dL (ref 12.0–15.0)
Immature Granulocytes: 0 %
Lymphocytes Relative: 35 %
Lymphs Abs: 1.4 10*3/uL (ref 0.7–4.0)
MCH: 29.4 pg (ref 26.0–34.0)
MCHC: 32.3 g/dL (ref 30.0–36.0)
MCV: 91.1 fL (ref 80.0–100.0)
Monocytes Absolute: 0.4 10*3/uL (ref 0.1–1.0)
Monocytes Relative: 11 %
Neutro Abs: 2 10*3/uL (ref 1.7–7.7)
Neutrophils Relative %: 51 %
Platelets: 172 10*3/uL (ref 150–400)
RBC: 4.59 MIL/uL (ref 3.87–5.11)
RDW: 12.3 % (ref 11.5–15.5)
WBC: 3.9 10*3/uL — ABNORMAL LOW (ref 4.0–10.5)
nRBC: 0 % (ref 0.0–0.2)

## 2019-08-16 ENCOUNTER — Telehealth: Payer: Self-pay | Admitting: Oncology

## 2019-08-16 NOTE — Telephone Encounter (Signed)
Scheduled appt per 3/22 los. Pt confirmed appt date and time.

## 2019-08-21 ENCOUNTER — Other Ambulatory Visit: Payer: Self-pay | Admitting: *Deleted

## 2019-08-21 DIAGNOSIS — D0511 Intraductal carcinoma in situ of right breast: Secondary | ICD-10-CM

## 2019-08-21 DIAGNOSIS — C50412 Malignant neoplasm of upper-outer quadrant of left female breast: Secondary | ICD-10-CM

## 2019-08-21 DIAGNOSIS — Z803 Family history of malignant neoplasm of breast: Secondary | ICD-10-CM

## 2019-08-22 ENCOUNTER — Encounter: Payer: Self-pay | Admitting: Oncology

## 2019-08-22 DIAGNOSIS — R928 Other abnormal and inconclusive findings on diagnostic imaging of breast: Secondary | ICD-10-CM | POA: Diagnosis not present

## 2019-09-07 DIAGNOSIS — R413 Other amnesia: Secondary | ICD-10-CM | POA: Diagnosis not present

## 2019-09-07 DIAGNOSIS — D352 Benign neoplasm of pituitary gland: Secondary | ICD-10-CM | POA: Diagnosis not present

## 2019-09-07 DIAGNOSIS — D333 Benign neoplasm of cranial nerves: Secondary | ICD-10-CM | POA: Diagnosis not present

## 2019-09-07 DIAGNOSIS — E236 Other disorders of pituitary gland: Secondary | ICD-10-CM | POA: Diagnosis not present

## 2019-09-07 DIAGNOSIS — R42 Dizziness and giddiness: Secondary | ICD-10-CM | POA: Diagnosis not present

## 2019-09-07 DIAGNOSIS — H903 Sensorineural hearing loss, bilateral: Secondary | ICD-10-CM | POA: Diagnosis not present

## 2019-09-07 DIAGNOSIS — Z923 Personal history of irradiation: Secondary | ICD-10-CM | POA: Diagnosis not present

## 2019-09-07 DIAGNOSIS — H918X2 Other specified hearing loss, left ear: Secondary | ICD-10-CM | POA: Diagnosis not present

## 2019-10-17 DIAGNOSIS — Z124 Encounter for screening for malignant neoplasm of cervix: Secondary | ICD-10-CM | POA: Diagnosis not present

## 2019-10-17 DIAGNOSIS — Z6821 Body mass index (BMI) 21.0-21.9, adult: Secondary | ICD-10-CM | POA: Diagnosis not present

## 2019-10-29 IMAGING — CR DG CHEST 1V PORT
1 series · 1 of 1 positions shown · non-contrast
Comparison: 12/27/2014

CLINICAL DATA: Status post right-sided catheter placement.

EXAM:
PORTABLE CHEST 1 VIEW

[portable]
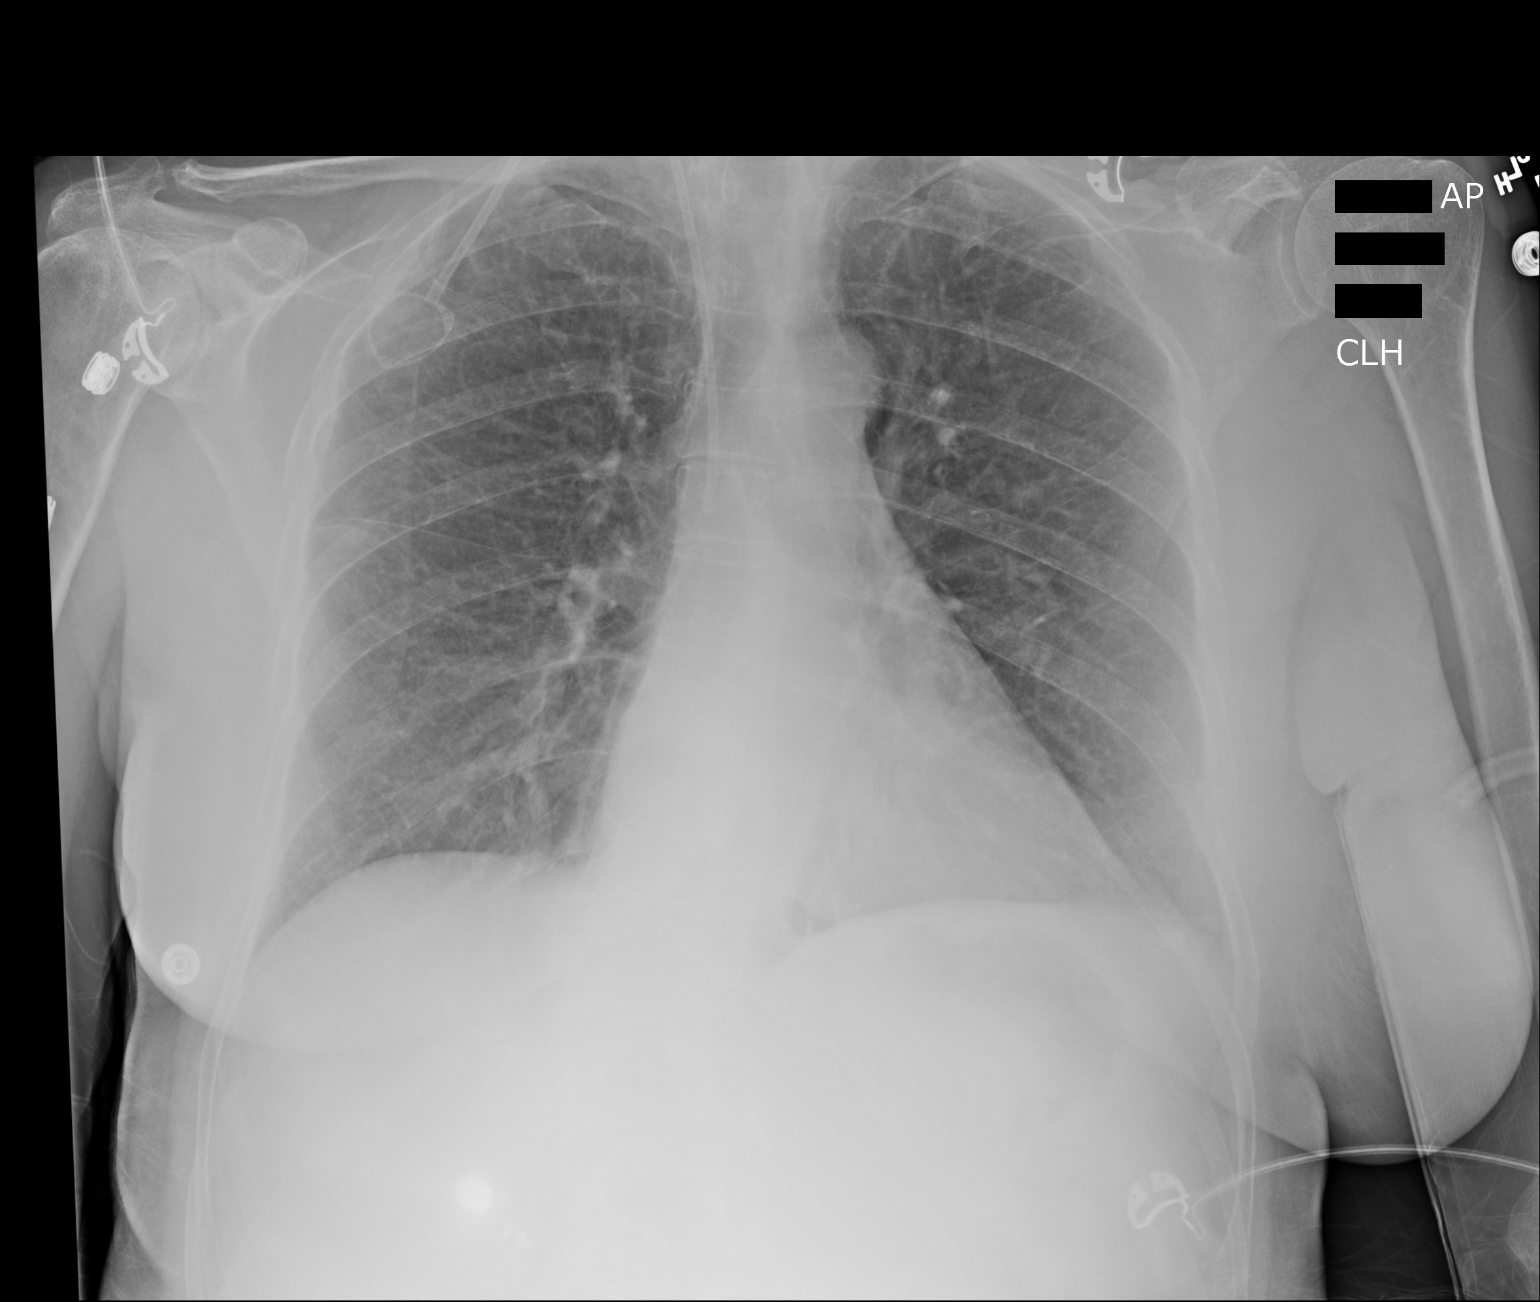

[1 of 1 positions shown; findings below may reference images not displayed]

FINDINGS: The heart size and mediastinal contours are within normal limits.
Right-sided approach port catheter is noted with tip in the mid SVC.
Both lungs are clear. Pneumothorax. The visualized skeletal
structures are unremarkable.
IMPRESSION: New right port catheter with tip in the mid SVC.  No pneumothorax.

## 2019-11-10 ENCOUNTER — Other Ambulatory Visit: Payer: Self-pay | Admitting: Pharmacist

## 2020-01-24 DIAGNOSIS — M25551 Pain in right hip: Secondary | ICD-10-CM | POA: Diagnosis not present

## 2020-01-24 DIAGNOSIS — M4316 Spondylolisthesis, lumbar region: Secondary | ICD-10-CM | POA: Diagnosis not present

## 2020-01-24 DIAGNOSIS — M25552 Pain in left hip: Secondary | ICD-10-CM | POA: Diagnosis not present

## 2020-02-17 DIAGNOSIS — Z23 Encounter for immunization: Secondary | ICD-10-CM | POA: Diagnosis not present

## 2020-05-02 DIAGNOSIS — L658 Other specified nonscarring hair loss: Secondary | ICD-10-CM | POA: Diagnosis not present

## 2020-05-02 DIAGNOSIS — R202 Paresthesia of skin: Secondary | ICD-10-CM | POA: Diagnosis not present

## 2020-05-02 DIAGNOSIS — D225 Melanocytic nevi of trunk: Secondary | ICD-10-CM | POA: Diagnosis not present

## 2020-05-02 DIAGNOSIS — Z808 Family history of malignant neoplasm of other organs or systems: Secondary | ICD-10-CM | POA: Diagnosis not present

## 2020-05-02 DIAGNOSIS — L578 Other skin changes due to chronic exposure to nonionizing radiation: Secondary | ICD-10-CM | POA: Diagnosis not present

## 2020-05-02 DIAGNOSIS — L570 Actinic keratosis: Secondary | ICD-10-CM | POA: Diagnosis not present

## 2020-05-02 DIAGNOSIS — Z86018 Personal history of other benign neoplasm: Secondary | ICD-10-CM | POA: Diagnosis not present

## 2020-05-02 DIAGNOSIS — L503 Dermatographic urticaria: Secondary | ICD-10-CM | POA: Diagnosis not present

## 2020-05-02 DIAGNOSIS — D2221 Melanocytic nevi of right ear and external auricular canal: Secondary | ICD-10-CM | POA: Diagnosis not present

## 2020-05-02 DIAGNOSIS — D2261 Melanocytic nevi of right upper limb, including shoulder: Secondary | ICD-10-CM | POA: Diagnosis not present

## 2020-05-02 DIAGNOSIS — D224 Melanocytic nevi of scalp and neck: Secondary | ICD-10-CM | POA: Diagnosis not present

## 2020-08-02 DIAGNOSIS — C50412 Malignant neoplasm of upper-outer quadrant of left female breast: Secondary | ICD-10-CM | POA: Diagnosis not present

## 2020-08-13 ENCOUNTER — Telehealth: Payer: Self-pay | Admitting: Oncology

## 2020-08-13 NOTE — Telephone Encounter (Signed)
Per sch msg, patient wants to r/s 4/18 appts. Called and left msg to call back when she's ready to r/s

## 2020-08-13 NOTE — Telephone Encounter (Signed)
Patient called in to r/s 4/18 appt. Confirmed new date and time

## 2020-08-22 DIAGNOSIS — Z853 Personal history of malignant neoplasm of breast: Secondary | ICD-10-CM | POA: Diagnosis not present

## 2020-08-27 ENCOUNTER — Encounter: Payer: Self-pay | Admitting: Oncology

## 2020-09-05 DIAGNOSIS — H903 Sensorineural hearing loss, bilateral: Secondary | ICD-10-CM | POA: Diagnosis not present

## 2020-09-05 DIAGNOSIS — D333 Benign neoplasm of cranial nerves: Secondary | ICD-10-CM | POA: Diagnosis not present

## 2020-09-05 DIAGNOSIS — H9041 Sensorineural hearing loss, unilateral, right ear, with unrestricted hearing on the contralateral side: Secondary | ICD-10-CM | POA: Diagnosis not present

## 2020-09-09 ENCOUNTER — Ambulatory Visit: Payer: Medicare Other | Admitting: Oncology

## 2020-09-09 ENCOUNTER — Other Ambulatory Visit: Payer: Medicare Other

## 2020-09-24 ENCOUNTER — Other Ambulatory Visit: Payer: Self-pay

## 2020-09-24 DIAGNOSIS — C50412 Malignant neoplasm of upper-outer quadrant of left female breast: Secondary | ICD-10-CM

## 2020-09-24 DIAGNOSIS — Z171 Estrogen receptor negative status [ER-]: Secondary | ICD-10-CM

## 2020-09-25 ENCOUNTER — Inpatient Hospital Stay (HOSPITAL_BASED_OUTPATIENT_CLINIC_OR_DEPARTMENT_OTHER): Payer: Medicare Other | Admitting: Oncology

## 2020-09-25 ENCOUNTER — Inpatient Hospital Stay: Payer: Medicare Other | Attending: Oncology

## 2020-09-25 ENCOUNTER — Other Ambulatory Visit: Payer: Self-pay

## 2020-09-25 VITALS — BP 121/60 | HR 73 | Temp 97.3°F | Resp 18 | Ht 63.0 in | Wt 122.2 lb

## 2020-09-25 DIAGNOSIS — D0511 Intraductal carcinoma in situ of right breast: Secondary | ICD-10-CM

## 2020-09-25 DIAGNOSIS — R21 Rash and other nonspecific skin eruption: Secondary | ICD-10-CM | POA: Insufficient documentation

## 2020-09-25 DIAGNOSIS — Z86000 Personal history of in-situ neoplasm of breast: Secondary | ICD-10-CM | POA: Diagnosis not present

## 2020-09-25 DIAGNOSIS — C50412 Malignant neoplasm of upper-outer quadrant of left female breast: Secondary | ICD-10-CM | POA: Diagnosis not present

## 2020-09-25 DIAGNOSIS — Z923 Personal history of irradiation: Secondary | ICD-10-CM | POA: Diagnosis not present

## 2020-09-25 DIAGNOSIS — Z171 Estrogen receptor negative status [ER-]: Secondary | ICD-10-CM | POA: Insufficient documentation

## 2020-09-25 DIAGNOSIS — Z9221 Personal history of antineoplastic chemotherapy: Secondary | ICD-10-CM | POA: Diagnosis not present

## 2020-09-25 LAB — CMP (CANCER CENTER ONLY)
ALT: 11 U/L (ref 0–44)
AST: 19 U/L (ref 15–41)
Albumin: 4.3 g/dL (ref 3.5–5.0)
Alkaline Phosphatase: 56 U/L (ref 38–126)
Anion gap: 8 (ref 5–15)
BUN: 17 mg/dL (ref 8–23)
CO2: 27 mmol/L (ref 22–32)
Calcium: 9.5 mg/dL (ref 8.9–10.3)
Chloride: 110 mmol/L (ref 98–111)
Creatinine: 0.85 mg/dL (ref 0.44–1.00)
GFR, Estimated: >60 mL/min (ref >60)
Glucose, Bld: 112 mg/dL — ABNORMAL HIGH (ref 70–99)
Potassium: 3.9 mmol/L (ref 3.5–5.1)
Sodium: 145 mmol/L (ref 135–145)
Total Bilirubin: 0.2 mg/dL — ABNORMAL LOW (ref 0.3–1.2)
Total Protein: 7.3 g/dL (ref 6.5–8.1)

## 2020-09-25 LAB — CBC WITH DIFFERENTIAL (CANCER CENTER ONLY)
Abs Immature Granulocytes: 0.01 10*3/uL (ref 0.00–0.07)
Basophils Absolute: 0 10*3/uL (ref 0.0–0.1)
Basophils Relative: 1 %
Eosinophils Absolute: 0.1 10*3/uL (ref 0.0–0.5)
Eosinophils Relative: 2 %
HCT: 39.9 % (ref 36.0–46.0)
Hemoglobin: 12.9 g/dL (ref 12.0–15.0)
Immature Granulocytes: 0 %
Lymphocytes Relative: 31 %
Lymphs Abs: 1.7 10*3/uL (ref 0.7–4.0)
MCH: 28.5 pg (ref 26.0–34.0)
MCHC: 32.3 g/dL (ref 30.0–36.0)
MCV: 88.1 fL (ref 80.0–100.0)
Monocytes Absolute: 0.5 10*3/uL (ref 0.1–1.0)
Monocytes Relative: 8 %
Neutro Abs: 3.1 10*3/uL (ref 1.7–7.7)
Neutrophils Relative %: 58 %
Platelet Count: 194 10*3/uL (ref 150–400)
RBC: 4.53 MIL/uL (ref 3.87–5.11)
RDW: 12.8 % (ref 11.5–15.5)
WBC Count: 5.3 10*3/uL (ref 4.0–10.5)
nRBC: 0 % (ref 0.0–0.2)

## 2020-09-25 MED ORDER — KETOCONAZOLE 2 % EX CREA: 1.0000 "application " | TOPICAL_CREAM | Freq: Every day | CUTANEOUS | 0 refills | Status: DC

## 2020-09-25 NOTE — Progress Notes (Signed)
[place]  [phone] [phone]     ID: Stephanie Bautista DOB: [birth year]  MR#: [id number]  [id number]#:[id number]  Patient Care Team: Haywood Pao, MD as PCP - General (Internal Medicine) Arvind Mexicano, Virgie Dad, MD as Consulting Physician (Oncology) Rolm Bookbinder, MD as Consulting Physician (General Surgery) Maisie Fus, MD as Consulting Physician (Obstetrics and Gynecology) Delrae Rend, MD as Consulting Physician (Endocrinology) Verner Chol, MD as Consulting Physician (Sports Medicine) Lavonia Dana, MD as Referring Physician (Otolaryngology) Eppie Gibson, MD as Attending Physician (Radiation Oncology) Georgina Quint, MD as Referring Physician (Radiology) OTHER MD:  CHIEF COMPLAINT: Triple negative breast cancer  CURRENT TREATMENT: observation   INTERVAL HISTORY: Stephanie Bautista is here today for follow up of her triple negative breast cancer. She continues on observation.  Since her last visit, she underwent bilateral diagnostic mammography with tomography at [facility] on 08/22/2020 showing: breast density category B; no evidence of malignancy in either breast.     REVIEW OF SYSTEMS: Stephanie Bautista is concerned about her husband who is having some new medical issues.  She herself tells me her hair is thinning a bit.  She is trying Rogaine.  She is going to consider some biotin.  She exercises by walking and she tries to do 3 to 4 miles every day.  A detailed review of systems today was otherwise stable  COVID 19 VACCINATION STATUS: [place] x2, most recently 06/2019   HISTORY OF CURRENT ILLNESS: From the original intake note:  I saw Stephanie Bautista remotely for her history of ductal carcinoma in situ of the right breast, treated with lumpectomy 09/02/1994, with the pathology showing ([id number]) ductal carcinoma in situ.  There was no invasive component.  This was followed by radiation given between 09/21/94 and 11/04/94, with a [place] of 88502 centigray to the  right breast and a total of 604 0 cGy to the tumor bed.  She notes that she didn't take any anti-estrogen therapy at that time.  More recently, Stephanie Bautista had routine screening mammography at [facility] on 07/16/2017 showing a possible abnormality in the left breast.  The breast density was category B.  She underwent left breast ultrasound on 07/16/2017 showing: a 1.5 cm irregular mass in the left breast upper outer quadrant posterior depth is consistent with carcinoma. A 6 mm irregular mass in the left breast upper outer quadrant posterior depth was also highly suggestive for malignancy. A lymph node in the left axillary tail was read as suspicious of malignancy.   Accordingly on 07/22/2017 she proceeded to biopsy of the left breast area in question as well as the suspicious left axillary lymph node. The pathology from this procedure showed ([id number]): Lymph node, needle/core biopsy, left axilla with no evidence of carcinoma-- concordant. Breast, left, needle core biopsy, invasive ductal carcinoma, grade 3, prognostic indicators significant for: ER, 0% negative and PR, 0% negative. Proliferation marker Ki67 at 50%. HER2 negative signals ratio is 1.36, and the number per cell 1.70 Breast, left, needle core biopsy, architectural distortion consistent with fibroadenoma--discordant.  Bilateral breast MRI on 07/28/2017 showed breast density category B. There was a 2.4 x 1.4 x 1.2 cm. enhancing mass in the posterior third of the upper-outer quadrant of the left breast corresponding with the biopsy proven invasive ductal carcinoma. No enlarged adenopathy is visualized.  The patient's subsequent history is as detailed below.   PAST MEDICAL HISTORY: Past Medical History:  Diagnosis Date  . Acoustic neuroma ([place])   . Adenomatous colon polyp 12/2002  .  Arthritis   . Breast cancer ([place]) 1996   right breast cancer in 1996, left breast cancer in 2019  . Cholelithiases   . Esophageal stricture   . Family history of breast  cancer   . Family history of ovarian cancer   . Family history of pancreatic cancer   . Family history of prostate cancer   . GERD (gastroesophageal reflux disease)   . Hemorrhoids   . Hiatal hernia   . History of colon polyps   . History of radiation therapy 02/07/18-03/04/2018   Left Breast/ 40.05 Gy in 15 fractions, Left Breast boost/ 10 Gy in 5 fractions.   . Inguinal hernia   . UTI (lower urinary tract infection)   As far as PMHx she has had intermittent back pain (spinal stenosis) x 2 years,  Osteoporosis, random migraines, early cataracts and early hear loss due to schwannoma.    PAST SURGICAL HISTORY: Past Surgical History:  Procedure Laterality Date  . BREAST LUMPECTOMY    . BREAST LUMPECTOMY WITH RADIOACTIVE SEED AND SENTINEL LYMPH NODE BIOPSY Left 12/15/2017   Procedure: LEFT BREAST LUMPECTOMY WITH RADIOACTIVE SEED AND LEFT SENTINEL LYMPH NODE BIOPSY, LEFT BREAST SEED GUIDED EXCISIONAL BIOPSY;  Surgeon: Rolm Bookbinder, MD;  Location: [facility];  Service: General;  Laterality: Left;  . CESAREAN SECTION     2x  . CHOLECYSTECTOMY  2008  . HEMORRHOID SURGERY  2007  . INGUINAL HERNIA REPAIR  1968  . PARTIAL HYSTERECTOMY  1996  . PORT-A-CATH REMOVAL Right 12/15/2017   Procedure: REMOVAL PORT-A-CATH;  Surgeon: Rolm Bookbinder, MD;  Location: [facility];  Service: General;  Laterality: Right;  . PORTACATH PLACEMENT Right 08/05/2017   Procedure: INSERTION PORT-A-CATH WITH [place];  Surgeon: Rolm Bookbinder, MD;  Location: [facility];  Service: General;  Laterality: Right;  . SHOULDER SURGERY Right 2005   torn muscle  . TONSILLECTOMY      FAMILY HISTORY Family History  Problem Relation Age of Onset  . Heart disease Mother   . Melanoma Mother        dx in her 50s  . Ovarian cancer Mother 85  . Heart disease Father   . Diabetes Father   . Stroke Father   . Ovarian cancer Sister 14  . Pancreatic cancer Maternal Aunt   . Bone  cancer Maternal Uncle   . Breast cancer Paternal Aunt   . Non-Hodgkin's lymphoma Maternal Grandmother   . Heart disease Maternal Grandfather   . Heart disease Paternal Grandmother   . Prostate cancer Paternal Grandfather   . Stroke Maternal Uncle   . Bone cancer Maternal Uncle   . Cancer Maternal Aunt        NOS  . Breast cancer Cousin        daughter of mat aunt with NOS cancer  Her mother died from CHF at age 46. Her father died from cardiac issues at age 75. She doesn't have any brothers, but has 3 sisters. She had one sister and her mother with ovarian cancer. Her sister was age 53 when she was diagnosed with ovarian cancer and her mother was in her late 46's when she was diagnosed with ovarian cancer. Her sister was not tested genetically following this. Her maternal grandmother had non-hodgkins lymphoma. Her paternal grandfather had prostate cancer. Her paternal aunt had breast cancer. Her paternal aunt's daughter had breast cancer. She had several uncles that had prostate cancer that spread to the bones.    GYNECOLOGIC  HISTORY:  No LMP recorded. Patient has had a hysterectomy. Menarche: 70 years old Age at first live birth: 70 years old [place] P2 LMP: hysterectomy at age 34 S/p Hysterectomy with USO (L)   SOCIAL HISTORY: (Updated May 2022)  She has been retired from clerical work since 05/26/2017. Her husband, Kasandra Knudsen has been retired from the [employer] for the past 9 years. Their son is Leroy Sea is 37 and a IT trainer. Their daughter, Adonis Brook lives in [place], is 56 and works in child care at [employer]. The patient has 6 grandchildren ( 2 boys and 4 girls) between the ages of 37 and 44. She is a Psychologist, forensic.     ADVANCED DIRECTIVES: In the absence of any documents to the contrary the patient's husband is her healthcare power of attorney.   HEALTH MAINTENANCE: Social History   Tobacco Use  . Smoking status: Never Smoker  . Smokeless tobacco: Never Used   Vaping Use  . Vaping Use: Never used  Substance Use Topics  . Alcohol use: No  . Drug use: No     Colonoscopy: Dr. Fuller Plan.   PAP: Dr Nori Riis  Bone density: at Dr. Verlon Au office/ osteoporosis.    Allergies  Allergen Reactions  . Amoxicillin Itching and Rash    Current Outpatient Medications  Medication Sig Dispense Refill  . acetaminophen (TYLENOL) 500 MG tablet Take 500 mg by mouth every 6 (six) hours as needed.    Marland Kitchen omeprazole (PRILOSEC) 40 MG capsule Take 1 capsule (40 mg total) by mouth 2 (two) times daily. (Patient taking differently: Take 40 mg by mouth daily. ) 60 capsule 11   No current facility-administered medications for this visit.    OBJECTIVE: White woman who appears younger than stated age  57:   09/25/20 1510  BP: 121/60  Pulse: 73  Resp: 18  Temp: (!) 97.3 F (36.3 C)  SpO2: 100%     Body mass index is 21.65 kg/m.   Wt Readings from Last 3 Encounters:  09/25/20 122 lb 3.2 oz (55.4 kg)  08/14/19 126 lb (57.2 kg)  10/31/18 125 lb 12.8 oz (57.1 kg)  ECOG FS:1 - Symptomatic but completely ambulatory  Sclerae unicteric, EOMs intact Wearing a mask No cervical or supraclavicular adenopathy Lungs no rales or rhonchi Heart regular rate and rhythm Abd soft, nontender, positive bowel sounds MSK no focal spinal tenderness, no upper extremity lymphedema Neuro: nonfocal, well oriented, appropriate affect Breasts: The right breast is status postlumpectomy and radiation with no evidence of disease recurrence.  The left breast and both axillae are benign   LAB RESULTS:  CMP     Component Value Date/Time   NA 140 08/14/2019 0918   K 3.7 08/14/2019 0918   CL 107 08/14/2019 0918   CO2 25 08/14/2019 0918   GLUCOSE 67 (L) 08/14/2019 0918   BUN 13 08/14/2019 0918   CREATININE 0.79 08/14/2019 0918   CREATININE 0.81 07/27/2017 1007   CREATININE 0.69 11/16/2011 1054   CALCIUM 9.1 08/14/2019 0918   PROT 6.7 08/14/2019 0918   ALBUMIN 4.0 08/14/2019 0918    AST 13 (L) 08/14/2019 0918   AST 16 07/27/2017 1007   ALT 8 08/14/2019 0918   ALT 12 07/27/2017 1007   ALKPHOS 76 08/14/2019 0918   BILITOT 0.6 08/14/2019 0918   BILITOT 0.6 07/27/2017 1007   GFRNONAA >60 08/14/2019 0918   GFRNONAA >60 07/27/2017 1007   GFRAA >60 08/14/2019 0918   GFRAA >60 07/27/2017 1007  No results found for: TOTALPROTELP, ALBUMINELP, A1GS, A2GS, BETS, BETA2SER, GAMS, MSPIKE, SPEI  No results found for: KPAFRELGTCHN, LAMBDASER, KAPLAMBRATIO  Lab Results  Component Value Date   WBC 5.3 09/25/2020   NEUTROABS 3.1 09/25/2020   HGB 12.9 09/25/2020   HCT 39.9 09/25/2020   MCV 88.1 09/25/2020   PLT 194 09/25/2020   No results found for: LABCA2  No components found for: LMBEML544  No results for input(s): INR in the last 168 hours.  No results found for: LABCA2  No results found for: BEE100  No results found for: FHQ197  No results found for: JOI325  No results found for: CA2729  No components found for: HGQUANT  No results found for: CEA1 / No results found for: CEA1   No results found for: AFPTUMOR  No results found for: CHROMOGRNA  No results found for: HGBA, HGBA2QUANT, HGBFQUANT, HGBSQUAN (Hemoglobinopathy evaluation)   No results found for: LDH  No results found for: IRON, TIBC, IRONPCTSAT (Iron and TIBC)  No results found for: FERRITIN  Urinalysis    Component Value Date/Time   BILIRUBINUR neg 04/06/2014 1019   PROTEINUR neg 04/06/2014 1019   UROBILINOGEN 0.2 04/06/2014 1019   NITRITE neg 04/06/2014 1019   LEUKOCYTESUR Negative 04/06/2014 1019    STUDIES: No results found.   ELIGIBLE FOR AVAILABLE RESEARCH PROTOCOL: no  ASSESSMENT: 70 y.o. [place] woman  (1) status post right lumpectomy 09/02/1994 for ductal carcinoma in situ  (a) status post adjuvant radiation 09/21/1994 through 11/04/1994, total 5040 centigrade to the breast and 604 0 cGy to the tumor bed  (b) did not receive adjuvant antiestrogens  (2)  status post left breast upper outer quadrant biopsy 07/22/2017 for a clinical T1c pN0, stage IB invasive ductal carcinoma, grade 3, triple negative, with an MIB-1 of 50%.  (a) lymph node biopsy on the same day was negative for malignancy (concordant)  (b) a second area of architectural distortion was also biopsied, read as fibroadenoma (discordant)  (c) a third area of architectural distortion has not yet been biopsied  (d) breast MRI 07/28/2017 shows a clinical T2 N0 tumor  (3) genetics testing 08/02/2017 through the Multi-Gene Panel offered by [facility] found no deleterious mutations in ALK, APC, ATM, AXIN2,BAP1,  BARD1, BLM, BMPR1A, BRCA1, BRCA2, BRIP1, CASR, CDC73, CDH1, CDK4, CDKN1B, CDKN1C, CDKN2A (p14ARF), CDKN2A (p16INK4a), CEBPA, CHEK2, CTNNA1, DICER1, DIS3L2, EGFR (c.2369C>T, p.Thr790Met variant only), EPCAM (Deletion/duplication testing only), FH, FLCN, GATA2, GPC3, GREM1 (Promoter region deletion/duplication testing only), HOXB13 (c.251G>A, p.Gly84Glu), HRAS, KIT, MAX, MEN1, MET, MITF (c.952G>A, p.Glu318Lys variant only), MLH1, MSH2, MSH3, MSH6, MUTYH, NBN, NF1, NF2, NTHL1, PALB2, PDGFRA, PHOX2B, PMS2, POLD1, POLE, POT1, PRKAR1A, PTCH1, PTEN, RAD50, RAD51C, RAD51D, RB1, RECQL4, RET, RUNX1, SDHAF2, SDHA (sequence changes only), SDHB, SDHC, SDHD, SMAD4, SMARCA4, SMARCB1, SMARCE1, STK11, SUFU, TERT, TERT, TMEM127, TP53, TSC1, TSC2, VHL, WRN and WT1.  The report date is August 02, 2017.  (a) WT1 c.332C>T VUS identified on the Multi-cancer gene panel.    (4) chemotherapy consisting of doxorubicin/ cyclophosphamide in dose dense fashion x4 started 08/24/2017, completed 10/12/2017,  followed by paclitaxel/ carboplatin weekly   (a) carboplatin/paclitaxel discontinued after 3 doses, stopped 11/23/2017, due to cytopenias  (b) post chemo MRI 12/04/2017 findings a complete imaging response  (5) status post left lumpectomy and sentinel lymph node sampling 12/15/2017 showing a complete pathologic response  (ypT0, ypN0)  (6) not a candidate for SWOG Q9826 study (post-op pembrolizumab)   (7) adjuvant radiation 02/07/18 through 03/04/2018:   Left breast 40.05 Gy  in 15 fractions, boost: 10 Gy in 5 fractions    PLAN: Stephanie Bautista is just about 3 years out from definitive surgery for her breast cancer with no evidence of disease recurrence.  This is very favorable.  She has a wonderful exercise program and I commended her for that.  She is already on Rogaine for the hair loss issue.  I suggested she could try biotin.  She has a little bit of a rash in the left inframammary fold and I wrote for Nizoral she can use as needed.  I am hopeful her husband will do well given his new medical issues  She will return to see as May 2023.  In May 2024 she will "graduate" from follow-up  Total encounter time 20 minutes.Sarajane Jews C. Charna Neeb, MD 09/25/20 3:24 PM [facility] [place], [place] [address] Tel. [phone]    Fax. [phone]   I, Wilburn Mylar, am acting as scribe for Dr. Virgie Dad. Gilford Lardizabal.  I, Lurline Del MD, have reviewed the above documentation for accuracy and completeness, and I agree with the above.   *Total Encounter Time as defined by the Centers for Medicare and Medicaid Services includes, in addition to the face-to-face time of a patient visit (documented in the note above) non-face-to-face time: obtaining and reviewing outside history, ordering and reviewing medications, tests or procedures, care coordination (communications with other health care professionals or caregivers) and documentation in the medical record.

## 2021-02-05 DIAGNOSIS — R5383 Other fatigue: Secondary | ICD-10-CM | POA: Diagnosis not present

## 2021-02-05 DIAGNOSIS — M810 Age-related osteoporosis without current pathological fracture: Secondary | ICD-10-CM | POA: Diagnosis not present

## 2021-02-05 DIAGNOSIS — E559 Vitamin D deficiency, unspecified: Secondary | ICD-10-CM | POA: Diagnosis not present

## 2021-02-05 DIAGNOSIS — M5030 Other cervical disc degeneration, unspecified cervical region: Secondary | ICD-10-CM | POA: Diagnosis not present

## 2021-02-05 DIAGNOSIS — M25511 Pain in right shoulder: Secondary | ICD-10-CM | POA: Diagnosis not present

## 2021-02-05 DIAGNOSIS — M546 Pain in thoracic spine: Secondary | ICD-10-CM | POA: Diagnosis not present

## 2021-05-06 DIAGNOSIS — L658 Other specified nonscarring hair loss: Secondary | ICD-10-CM | POA: Diagnosis not present

## 2021-05-06 DIAGNOSIS — M79674 Pain in right toe(s): Secondary | ICD-10-CM | POA: Diagnosis not present

## 2021-05-06 DIAGNOSIS — Z23 Encounter for immunization: Secondary | ICD-10-CM | POA: Diagnosis not present

## 2021-05-06 DIAGNOSIS — L821 Other seborrheic keratosis: Secondary | ICD-10-CM | POA: Diagnosis not present

## 2021-05-06 DIAGNOSIS — D225 Melanocytic nevi of trunk: Secondary | ICD-10-CM | POA: Diagnosis not present

## 2021-05-06 DIAGNOSIS — D485 Neoplasm of uncertain behavior of skin: Secondary | ICD-10-CM | POA: Diagnosis not present

## 2021-05-06 DIAGNOSIS — L720 Epidermal cyst: Secondary | ICD-10-CM | POA: Diagnosis not present

## 2021-05-06 DIAGNOSIS — R202 Paresthesia of skin: Secondary | ICD-10-CM | POA: Diagnosis not present

## 2021-05-06 DIAGNOSIS — L578 Other skin changes due to chronic exposure to nonionizing radiation: Secondary | ICD-10-CM | POA: Diagnosis not present

## 2021-05-06 DIAGNOSIS — Z86018 Personal history of other benign neoplasm: Secondary | ICD-10-CM | POA: Diagnosis not present

## 2021-05-06 DIAGNOSIS — L570 Actinic keratosis: Secondary | ICD-10-CM | POA: Diagnosis not present

## 2021-05-06 DIAGNOSIS — D224 Melanocytic nevi of scalp and neck: Secondary | ICD-10-CM | POA: Diagnosis not present

## 2021-08-26 ENCOUNTER — Encounter: Payer: Self-pay | Admitting: Adult Health

## 2021-08-26 DIAGNOSIS — Z1231 Encounter for screening mammogram for malignant neoplasm of breast: Secondary | ICD-10-CM | POA: Diagnosis not present

## 2021-09-10 NOTE — Progress Notes (Signed)
? ?Patient Care Team: ?Tisovec, Fransico Him, MD as PCP - General (Internal Medicine) ?Magrinat, Virgie Dad, MD (Inactive) as Consulting Physician (Oncology) ?Rolm Bookbinder, MD as Consulting Physician (General Surgery) ?Maisie Fus, MD as Consulting Physician (Obstetrics and Gynecology) ?Delrae Rend, MD as Consulting Physician (Endocrinology) ?Verner Chol, MD as Consulting Physician (Sports Medicine) ?Lavonia Dana, MD as Referring Physician (Otolaryngology) ?Eppie Gibson, MD as Attending Physician (Radiation Oncology) ?Georgina Quint, MD as Referring Physician (Radiology) ? ?DIAGNOSIS:  ?Encounter Diagnosis  ?Name Primary?  ? Malignant neoplasm of upper-outer quadrant of left breast in female, estrogen receptor negative ([place]) Yes  ? ? ?SUMMARY OF ONCOLOGIC HISTORY: ?Oncology History  ?Malignant neoplasm of upper-outer quadrant of left breast in female, estrogen receptor negative ([place])  ?09/02/1994 Surgery  ? status post right lumpectomy 09/02/1994 for ductal carcinoma in situ ?            (a) status post adjuvant radiation 09/21/1994 through 11/04/1994, total 5040 centigrade to the breast and 604 0 cGy to the tumor bed ?            (b) did not receive adjuvant antiestrogens ? ?  ?07/22/2017 Initial Biopsy  ?  status post left breast upper outer quadrant biopsy for a clinical T1c pN0, stage IB invasive ductal carcinoma, grade 3, triple negative, with an MIB-1 of 50%. ?            (a) lymph node biopsy on the same day was negative for malignancy (concordant) ?            (b) a second area of architectural distortion was also biopsied, read as fibroadenoma (discordant) ?            (c) a third area of architectural distortion has not yet been biopsied ?            (d) breast MRI 07/28/2017 shows a clinical T2 N0 tumor ? ?  ?08/02/2017 Genetic Testing  ? WT1 c.332C>T VUS identified on the Multi-cancer gene panel.  The Multi-Gene Panel offered by [facility] includes sequencing and/or deletion duplication  testing of the following 83 genes: ALK, APC, ATM, AXIN2,BAP1,  BARD1, BLM, BMPR1A, BRCA1, BRCA2, BRIP1, CASR, CDC73, CDH1, CDK4, CDKN1B, CDKN1C, CDKN2A (p14ARF), CDKN2A (p16INK4a), CEBPA, CHEK2, CTNNA1, DICER1, DIS3L2, EGFR (c.2369C>T, p.Thr790Met variant only), EPCAM (Deletion/duplication testing only), FH, FLCN, GATA2, GPC3, GREM1 (Promoter region deletion/duplication testing only), HOXB13 (c.251G>A, p.Gly84Glu), HRAS, KIT, MAX, MEN1, MET, MITF (c.952G>A, p.Glu318Lys variant only), MLH1, MSH2, MSH3, MSH6, MUTYH, NBN, NF1, NF2, NTHL1, PALB2, PDGFRA, PHOX2B, PMS2, POLD1, POLE, POT1, PRKAR1A, PTCH1, PTEN, RAD50, RAD51C, RAD51D, RB1, RECQL4, RET, RUNX1, SDHAF2, SDHA (sequence changes only), SDHB, SDHC, SDHD, SMAD4, SMARCA4, SMARCB1, SMARCE1, STK11, SUFU, TERT, TERT, TMEM127, TP53, TSC1, TSC2, VHL, WRN and WT1.  The report date is August 02, 2017. ? ? ?  ?08/24/2017 - 11/23/2017 Neo-Adjuvant Chemotherapy  ?  chemotherapy consisting of doxorubicin/ cyclophosphamide in dose dense fashion x4 started 08/24/2017, completed 10/12/2017,  followed by paclitaxel/ carboplatin weekly  ?            (a) carboplatin/paclitaxel discontinued after 3 doses, stopped 11/23/2017, due to cytopenias ?            (b) post chemo MRI 12/04/2017 findings a complete imaging response ? ?  ?12/15/2017 Surgery  ? status post left lumpectomy and sentinel lymph node sampling showing a complete pathologic response (ypT0, ypN0) ? ?  ?02/07/2018 - 03/04/2018 Radiation Therapy  ? adjuvant radiation with Dr. Isidore Moos: Left breast 40.05  Gy in 15 fractions, boost: 10 Gy in 5 fractions  ? ?  ? ? ?CHIEF COMPLIANT: Triple negative breast cancer ? ?INTERVAL HISTORY: Stephanie Bautista is a 71 y.o. with the above mention Triple negative breast cancer. She presents to the clinic today for a annual follow-up.  Recently she had a brain MRI which was a follow-up that she gets every year for acoustic neuroma.  The scan revealed a right frontal bone lesion measuring 1.6 cm.  A  bone scan was ordered which showed nonspecific right frontal abnormality.  No other areas of bone disease were identified.  She is completely asymptomatic. ? ? ?ALLERGIES:  is allergic to amoxicillin. ? ?MEDICATIONS:  ?Current Outpatient Medications  ?Medication Sig Dispense Refill  ? pramoxine (CERAVE ITCH RELIEF) 1 % LOTN 1 application as needed    ? acetaminophen (TYLENOL) 500 MG tablet Take 500 mg by mouth every 6 (six) hours as needed.    ? ketoconazole (NIZORAL) 2 % cream Apply 1 application topically daily. 15 g 0  ? omeprazole (PRILOSEC) 40 MG capsule Take 1 capsule (40 mg total) by mouth 2 (two) times daily. (Patient taking differently: Take 40 mg by mouth daily. ) 60 capsule 11  ? ?No current facility-administered medications for this visit.  ? ? ?PHYSICAL EXAMINATION: ?ECOG PERFORMANCE STATUS: 1 - Symptomatic but completely ambulatory ? ?Vitals:  ? 09/24/21 1126  ?BP: (!) 152/64  ?Pulse: 65  ?Resp: 18  ?Temp: 97.8 ?F (36.6 ?C)  ?SpO2: 100%  ? ?Filed Weights  ? 09/24/21 1126  ?Weight: 119 lb 14.4 oz (54.4 kg)  ? ? ?BREAST: No palpable masses or nodules in either right or left breasts. No palpable axillary supraclavicular or infraclavicular adenopathy no breast tenderness or nipple discharge. (exam performed in the presence of a chaperone) ? ?LABORATORY DATA:  ?I have reviewed the data as listed ? ?  Latest Ref Rng & Units 09/24/2021  ? 10:54 AM 09/25/2020  ?  2:37 PM 08/14/2019  ?  9:18 AM  ?CMP  ?Glucose 70 - 99 mg/dL 89   112   67    ?BUN 8 - 23 mg/dL $Remove'12   17   13    'aTHQazt$ ?Creatinine 0.44 - 1.00 mg/dL 0.80   0.85   0.79    ?Sodium 135 - 145 mmol/L 139   145   140    ?Potassium 3.5 - 5.1 mmol/L 4.2   3.9   3.7    ?Chloride 98 - 111 mmol/L 106   110   107    ?CO2 22 - 32 mmol/L $RemoveB'27   27   25    'dwgEKDlH$ ?Calcium 8.9 - 10.3 mg/dL 9.2   9.5   9.1    ?Total Protein 6.5 - 8.1 g/dL 6.7   7.3   6.7    ?Total Bilirubin 0.3 - 1.2 mg/dL 0.7   0.2   0.6    ?Alkaline Phos 38 - 126 U/L 51   56   76    ?AST 15 - 41 U/L $Remo'18   19   13     'sgcMn$ ?ALT 0 - 44 U/L $Remo'11   11   8    'DZXNC$ ? ? ?Lab Results  ?Component Value Date  ? WBC 4.3 09/24/2021  ? HGB 12.6 09/24/2021  ? HCT 39.1 09/24/2021  ? MCV 87.3 09/24/2021  ? PLT 173 09/24/2021  ? NEUTROABS 2.4 09/24/2021  ? ? ?ASSESSMENT & PLAN:  ?Malignant neoplasm of upper-outer quadrant of left breast  in female, estrogen receptor negative ([place]) ?09/02/1994: Rt Lumpectomy: DCIS ?09/21/1994-11/04/1994: XRT (did not receive Anti estrogens) ? ?07/22/2017: T1c N0 Satge 1B: Grade 3 IDC TNBC Ki 67: 50% ?08/02/2017: Genetics Neg ?08/24/17-11/23/2017:Neo Adj chemo DD AC x 4, Taxol-carbo X 3 (stopped for cytopenias) ?12/15/2017: Left Lumpectomy: Path CR ?02/07/2018-03/04/2018: XRT ? ?Breast Cancer Surveillance: ?1. Breast exam 09/24/2021: Normal ?2. Mammogram 08/26/21 No abnormalities. Postsurgical changes. Breast Density Category B. ? ?Right frontal bone lesion: Bone scan does not reveal any clear-cut abnormalities.  It is nonspecific changes noted.  Therefore I do not believe that she has metastatic disease.  We will recheck with another bone scan in 6 months and follow-up after that. ? ?RTC in 6 months after bone scan and follow-up ?  ? ? ? ?Orders Placed This Encounter  ?Procedures  ? NM Bone Scan Whole Body  ?  Standing Status:   Future  ?  Standing Expiration Date:   09/25/2022  ?  Order Specific Question:   If indicated for the ordered procedure, I authorize the administration of a radiopharmaceutical per Radiology protocol  ?  Answer:   Yes  ?  Order Specific Question:   Preferred imaging location?  ?  Answer:   [facility]  ?  Order Specific Question:   Release to patient  ?  Answer:   Immediate  ? ?The patient has a good understanding of the overall plan. she agrees with it. she will call with any problems that may develop before the next visit here. ?Total time spent: 30 mins including face to face time and time spent for planning, charting and co-ordination of care ? ? Harriette Ohara, MD ?09/24/21 ? ? ? I Gardiner Coins am  scribing for Dr. Lindi Adie ? ?I have reviewed the above documentation for accuracy and completeness, and I agree with the above. ?  ?

## 2021-09-11 DIAGNOSIS — M899 Disorder of bone, unspecified: Secondary | ICD-10-CM | POA: Diagnosis not present

## 2021-09-11 DIAGNOSIS — D333 Benign neoplasm of cranial nerves: Secondary | ICD-10-CM | POA: Diagnosis not present

## 2021-09-11 DIAGNOSIS — R413 Other amnesia: Secondary | ICD-10-CM | POA: Diagnosis not present

## 2021-09-11 DIAGNOSIS — Z880 Allergy status to penicillin: Secondary | ICD-10-CM | POA: Diagnosis not present

## 2021-09-11 DIAGNOSIS — H9041 Sensorineural hearing loss, unilateral, right ear, with unrestricted hearing on the contralateral side: Secondary | ICD-10-CM | POA: Diagnosis not present

## 2021-09-11 DIAGNOSIS — Z923 Personal history of irradiation: Secondary | ICD-10-CM | POA: Diagnosis not present

## 2021-09-11 DIAGNOSIS — H903 Sensorineural hearing loss, bilateral: Secondary | ICD-10-CM | POA: Diagnosis not present

## 2021-09-11 DIAGNOSIS — L659 Nonscarring hair loss, unspecified: Secondary | ICD-10-CM | POA: Diagnosis not present

## 2021-09-16 DIAGNOSIS — M899 Disorder of bone, unspecified: Secondary | ICD-10-CM | POA: Diagnosis not present

## 2021-09-16 DIAGNOSIS — R930 Abnormal findings on diagnostic imaging of skull and head, not elsewhere classified: Secondary | ICD-10-CM | POA: Diagnosis not present

## 2021-09-23 ENCOUNTER — Other Ambulatory Visit: Payer: Self-pay

## 2021-09-23 DIAGNOSIS — C50412 Malignant neoplasm of upper-outer quadrant of left female breast: Secondary | ICD-10-CM

## 2021-09-24 ENCOUNTER — Inpatient Hospital Stay: Payer: Medicare Other | Attending: Hematology and Oncology

## 2021-09-24 ENCOUNTER — Other Ambulatory Visit: Payer: Self-pay

## 2021-09-24 ENCOUNTER — Inpatient Hospital Stay (HOSPITAL_BASED_OUTPATIENT_CLINIC_OR_DEPARTMENT_OTHER): Payer: Medicare Other | Admitting: Hematology and Oncology

## 2021-09-24 VITALS — BP 152/64 | HR 65 | Temp 97.8°F | Resp 18 | Ht 63.0 in | Wt 119.9 lb

## 2021-09-24 DIAGNOSIS — C50412 Malignant neoplasm of upper-outer quadrant of left female breast: Secondary | ICD-10-CM | POA: Diagnosis not present

## 2021-09-24 DIAGNOSIS — Z171 Estrogen receptor negative status [ER-]: Secondary | ICD-10-CM

## 2021-09-24 DIAGNOSIS — M899 Disorder of bone, unspecified: Secondary | ICD-10-CM | POA: Insufficient documentation

## 2021-09-24 DIAGNOSIS — C50411 Malignant neoplasm of upper-outer quadrant of right female breast: Secondary | ICD-10-CM | POA: Diagnosis not present

## 2021-09-24 LAB — CBC WITH DIFFERENTIAL (CANCER CENTER ONLY)
Abs Immature Granulocytes: 0.01 10*3/uL (ref 0.00–0.07)
Basophils Absolute: 0.1 10*3/uL (ref 0.0–0.1)
Basophils Relative: 1 %
Eosinophils Absolute: 0.1 10*3/uL (ref 0.0–0.5)
Eosinophils Relative: 1 %
HCT: 39.1 % (ref 36.0–46.0)
Hemoglobin: 12.6 g/dL (ref 12.0–15.0)
Immature Granulocytes: 0 %
Lymphocytes Relative: 32 %
Lymphs Abs: 1.4 10*3/uL (ref 0.7–4.0)
MCH: 28.1 pg (ref 26.0–34.0)
MCHC: 32.2 g/dL (ref 30.0–36.0)
MCV: 87.3 fL (ref 80.0–100.0)
Monocytes Absolute: 0.4 10*3/uL (ref 0.1–1.0)
Monocytes Relative: 9 %
Neutro Abs: 2.4 10*3/uL (ref 1.7–7.7)
Neutrophils Relative %: 57 %
Platelet Count: 173 10*3/uL (ref 150–400)
RBC: 4.48 MIL/uL (ref 3.87–5.11)
RDW: 13.1 % (ref 11.5–15.5)
WBC Count: 4.3 10*3/uL (ref 4.0–10.5)
nRBC: 0 % (ref 0.0–0.2)

## 2021-09-24 LAB — CMP (CANCER CENTER ONLY)
ALT: 11 U/L (ref 0–44)
AST: 18 U/L (ref 15–41)
Albumin: 3.9 g/dL (ref 3.5–5.0)
Alkaline Phosphatase: 51 U/L (ref 38–126)
Anion gap: 6 (ref 5–15)
BUN: 12 mg/dL (ref 8–23)
CO2: 27 mmol/L (ref 22–32)
Calcium: 9.2 mg/dL (ref 8.9–10.3)
Chloride: 106 mmol/L (ref 98–111)
Creatinine: 0.8 mg/dL (ref 0.44–1.00)
GFR, Estimated: >60 mL/min (ref >60)
Glucose, Bld: 89 mg/dL (ref 70–99)
Potassium: 4.2 mmol/L (ref 3.5–5.1)
Sodium: 139 mmol/L (ref 135–145)
Total Bilirubin: 0.7 mg/dL (ref 0.3–1.2)
Total Protein: 6.7 g/dL (ref 6.5–8.1)

## 2021-09-24 NOTE — Assessment & Plan Note (Signed)
09/02/1994: Rt Lumpectomy: DCIS ?09/21/1994-11/04/1994: XRT (did not receive Anti estrogens) ? ?07/22/2017: T1c N0 Satge 1B: Grade 3 IDC TNBC Ki 67: 50% ?08/02/2017: Genetics Neg ?08/24/17-11/23/2017:Neo Adj chemo DD AC x 4, Taxol-carbo X 3 (stopped for cytopenias) ?12/15/2017: Left Lumpectomy: Path CR ?02/07/2018-03/04/2018: XRT ? ?Breast Cancer Surveillance: ?1. Breast exam 09/24/2021: Normal ?2. Mammogram 08/26/21 No abnormalities. Postsurgical changes. Breast Density Category B. ? ?RTC in 1 year and after that she can be followed on an as needed basis ?  ? ?

## 2021-09-25 ENCOUNTER — Telehealth: Payer: Self-pay | Admitting: Hematology and Oncology

## 2021-09-25 NOTE — Telephone Encounter (Signed)
Per 5/3 los called and spoke to pt about 6 moth appointment.  Pt confirmed appointment  ?

## 2021-11-12 DIAGNOSIS — Z86010 Personal history of colonic polyps: Secondary | ICD-10-CM | POA: Diagnosis not present

## 2022-02-11 DIAGNOSIS — N393 Stress incontinence (female) (male): Secondary | ICD-10-CM | POA: Diagnosis not present

## 2022-02-11 DIAGNOSIS — K644 Residual hemorrhoidal skin tags: Secondary | ICD-10-CM | POA: Diagnosis not present

## 2022-02-11 DIAGNOSIS — Z86010 Personal history of colonic polyps: Secondary | ICD-10-CM | POA: Diagnosis not present

## 2022-02-11 DIAGNOSIS — K642 Third degree hemorrhoids: Secondary | ICD-10-CM | POA: Diagnosis not present

## 2022-03-19 ENCOUNTER — Ambulatory Visit: Payer: Medicare Other | Admitting: Obstetrics and Gynecology

## 2022-03-23 ENCOUNTER — Telehealth: Payer: Self-pay | Admitting: Hematology and Oncology

## 2022-03-23 NOTE — Telephone Encounter (Signed)
Rescheduled appointment per provider [facility]. Patient is aware of the changes made to her upcoming appointment. 

## 2022-03-24 ENCOUNTER — Encounter (HOSPITAL_COMMUNITY)
Admission: RE | Admit: 2022-03-24 | Discharge: 2022-03-24 | Disposition: A | Discharge status: Home / Self Care | Payer: Medicare Other | Source: Ambulatory Visit | Attending: Hematology and Oncology | Admitting: Hematology and Oncology

## 2022-03-24 DIAGNOSIS — Z171 Estrogen receptor negative status [ER-]: Secondary | ICD-10-CM | POA: Diagnosis not present

## 2022-03-24 DIAGNOSIS — C50412 Malignant neoplasm of upper-outer quadrant of left female breast: Secondary | ICD-10-CM | POA: Insufficient documentation

## 2022-03-24 DIAGNOSIS — M419 Scoliosis, unspecified: Secondary | ICD-10-CM | POA: Diagnosis not present

## 2022-03-24 DIAGNOSIS — C50919 Malignant neoplasm of unspecified site of unspecified female breast: Secondary | ICD-10-CM | POA: Diagnosis not present

## 2022-03-24 MED ORDER — TECHNETIUM TC 99M MEDRONATE IV KIT
20.0000 | PACK | Freq: Once | INTRAVENOUS | Status: AC | PRN
  Administered 2022-03-24: 20 via INTRAVENOUS

## 2022-03-31 ENCOUNTER — Encounter: Payer: Self-pay | Admitting: Gastroenterology

## 2022-03-31 ENCOUNTER — Inpatient Hospital Stay: Payer: Medicare Other | Attending: Hematology and Oncology | Admitting: Hematology and Oncology

## 2022-03-31 VITALS — BP 166/70 | HR 66 | Temp 97.2°F | Resp 18 | Ht 63.0 in | Wt 124.2 lb

## 2022-03-31 DIAGNOSIS — Z171 Estrogen receptor negative status [ER-]: Secondary | ICD-10-CM | POA: Insufficient documentation

## 2022-03-31 DIAGNOSIS — M899 Disorder of bone, unspecified: Secondary | ICD-10-CM | POA: Diagnosis not present

## 2022-03-31 DIAGNOSIS — Z9221 Personal history of antineoplastic chemotherapy: Secondary | ICD-10-CM | POA: Insufficient documentation

## 2022-03-31 DIAGNOSIS — C50412 Malignant neoplasm of upper-outer quadrant of left female breast: Secondary | ICD-10-CM | POA: Diagnosis not present

## 2022-03-31 DIAGNOSIS — Z79899 Other long term (current) drug therapy: Secondary | ICD-10-CM | POA: Diagnosis not present

## 2022-03-31 DIAGNOSIS — Z923 Personal history of irradiation: Secondary | ICD-10-CM | POA: Insufficient documentation

## 2022-03-31 NOTE — Progress Notes (Signed)
Patient Care Team: Tisovec, Fransico Him, MD as PCP - General (Internal Medicine) Magrinat, Virgie Dad, MD (Inactive) as Consulting Physician (Oncology) Rolm Bookbinder, MD as Consulting Physician (General Surgery) Maisie Fus, MD (Inactive) as Consulting Physician (Obstetrics and Gynecology) Delrae Rend, MD as Consulting Physician (Endocrinology) Verner Chol, MD as Consulting Physician (Sports Medicine) Lavonia Dana, MD as Referring Physician (Otolaryngology) Eppie Gibson, MD as Attending Physician (Radiation Oncology) Georgina Quint, MD as Referring Physician (Radiology)  DIAGNOSIS:  Encounter Diagnosis  Name Primary?   Malignant neoplasm of upper-outer quadrant of left breast in female, estrogen receptor negative ([place]) Yes    SUMMARY OF ONCOLOGIC HISTORY: Oncology History  Malignant neoplasm of upper-outer quadrant of left breast in female, estrogen receptor negative ([place])  09/02/1994 Surgery   status post right lumpectomy 09/02/1994 for ductal carcinoma in situ             (a) status post adjuvant radiation 09/21/1994 through 11/04/1994, total 5040 centigrade to the breast and 604 0 cGy to the tumor bed             (b) did not receive adjuvant antiestrogens   07/22/2017 Initial Biopsy    status post left breast upper outer quadrant biopsy for a clinical T1c pN0, stage IB invasive ductal carcinoma, grade 3, triple negative, with an MIB-1 of 50%.             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  The Multi-Gene Panel offered by [facility] includes sequencing and/or deletion  duplication testing of the following 83 genes: ALK, APC, ATM, AXIN2,BAP1,  BARD1, BLM, BMPR1A, BRCA1, BRCA2, BRIP1, CASR, CDC73, CDH1, CDK4, CDKN1B, CDKN1C, CDKN2A (p14ARF), CDKN2A (p16INK4a), CEBPA, CHEK2, CTNNA1, DICER1, DIS3L2, EGFR (c.2369C>T, p.Thr790Met variant only), EPCAM (Deletion/duplication testing only), FH, FLCN, GATA2, GPC3, GREM1 (Promoter region deletion/duplication testing only), HOXB13 (c.251G>A, p.Gly84Glu), HRAS, KIT, MAX, MEN1, MET, MITF (c.952G>A, p.Glu318Lys variant only), MLH1, MSH2, MSH3, MSH6, MUTYH, NBN, NF1, NF2, NTHL1, PALB2, PDGFRA, PHOX2B, PMS2, POLD1, POLE, POT1, PRKAR1A, PTCH1, PTEN, RAD50, RAD51C, RAD51D, RB1, RECQL4, RET, RUNX1, SDHAF2, SDHA (sequence changes only), SDHB, SDHC, SDHD, SMAD4, SMARCA4, SMARCB1, SMARCE1, STK11, SUFU, TERT, TERT, TMEM127, TP53, TSC1, TSC2, VHL, WRN and WT1.  The report date is August 02, 2017.    08/24/2017 - 11/23/2017 Neo-Adjuvant Chemotherapy    chemotherapy consisting of doxorubicin/ cyclophosphamide in dose dense fashion x4 started 08/24/2017, completed 10/12/2017,  followed by paclitaxel/ carboplatin weekly              (a) carboplatin/paclitaxel discontinued after 3 doses, stopped 11/23/2017, due to cytopenias             (b) post chemo MRI 12/04/2017 findings a complete imaging response   12/15/2017 Surgery   status post left lumpectomy and sentinel lymph node sampling showing a complete pathologic response (ypT0, ypN0)   02/07/2018 - 03/04/2018 Radiation Therapy   adjuvant radiation with Dr. Isidore Moos: Left breast 40.05 Gy in 15 fractions,  boost: 10 Gy in 5 fractions      CHIEF COMPLIANT: Follow-up triple negative breast cancer and bone scan  INTERVAL HISTORY: Stephanie Bautista is a 71 y.o. with the above mention Triple negative breast cancer. She presents to the clinic today for a follow-up to review scans. She denies any pain in the rib. She states that she feels ok.   ALLERGIES:  is allergic to amoxicillin.  MEDICATIONS:  Current  Outpatient Medications  Medication Sig Dispense Refill   ascorbic acid (VITAMIN C) 500 MG tablet Take 500 mg by mouth daily.     Multiple Vitamin (MULTI-VITAMIN) tablet Take 1 tablet by mouth daily.     acetaminophen (TYLENOL) 500 MG tablet Take 500 mg by mouth every 6 (six) hours as needed.     ketoconazole (NIZORAL) 2 % cream Apply 1 application topically daily. 15 g 0   omeprazole (PRILOSEC) 40 MG capsule Take 1 capsule (40 mg total) by mouth 2 (two) times daily. (Patient taking differently: Take 40 mg by mouth daily. ) 60 capsule 11   pramoxine (CERAVE ITCH RELIEF) 1 % LOTN 1 application as needed     No current facility-administered medications for this visit.    PHYSICAL EXAMINATION: ECOG PERFORMANCE STATUS: 1 - Symptomatic but completely ambulatory  Vitals:   03/31/22 0924  BP: (!) 166/70  Pulse: 66  Resp: 18  Temp: (!) 97.2 F (36.2 C)  SpO2: 100%   Filed Weights   03/31/22 0924  Weight: 124 lb 3.2 oz (56.3 kg)      LABORATORY DATA:  I have reviewed the data as listed    Latest Ref Rng & Units 09/24/2021   10:54 AM 09/25/2020    2:37 PM 08/14/2019    9:18 AM  CMP  Glucose 70 - 99 mg/dL 89  112  67   BUN 8 - 23 mg/dL _0 Creatinine 0.44 - 1.00 mg/dL 0.80  0.85  0.79   Sodium 135 - 145 mmol/L 139  145  140   Potassium 3.5 - 5.1 mmol/L 4.2  3.9  3.7   Chloride 98 - 111 mmol/L 106  110  107   CO2 22 - 32 mmol/L _1 Calcium 8.9 - 10.3 mg/dL 9.2  9.5  9.1   Total Protein 6.5 - 8.1 g/dL 6.7  7.3  6.7   Total Bilirubin 0.3 - 1.2 mg/dL 0.7  0.2  0.6   Alkaline Phos 38 - 126 U/L 51  56  76   AST 15 - 41 U/L _2 ALT 0 - 44 U/L _3 Lab Results  Component Value Date   WBC 4.3 09/24/2021   HGB 12.6 09/24/2021   HCT 39.1 09/24/2021   MCV 87.3 09/24/2021   PLT 173 09/24/2021   NEUTROABS 2.4 09/24/2021    ASSESSMENT & PLAN:  Malignant neoplasm of upper-outer quadrant of left breast in female, estrogen receptor negative  ([place]) 09/02/1994: Rt Lumpectomy: DCIS 09/21/1994-11/04/1994: XRT (did not receive Anti estrogens)   07/22/2017: T1c N0 Satge 1B: Grade 3 IDC TNBC Ki 67: 50% 08/02/2017: Genetics Neg 08/24/17-11/23/2017:Neo Adj chemo DD AC x 4, Taxol-carbo X 3 (stopped for cytopenias) 12/15/2017: Left Lumpectomy: Path CR 02/07/2018-03/04/2018: XRT   Breast Cancer Surveillance: 1. Breast exam 03/31/2022: Normal 2. Mammogram 08/26/21 No abnormalities. Postsurgical changes. Breast Density Category B. 3.  Bone scan 03/26/2022: Small foci  of uptake in the right side of the calvarium, C7 vertebral body, anterior right fifth rib suggesting possible metastatic disease.  Subtle foci of uptake L4 and L5 may be metastatic disease versus degenerative arthritis   I requested radiology to obtain images from [facility] and compare the 2 bone scans. Depending on that we can decide if she needs a biopsy. The frontal bone lesion could be related to the screws that she had placed on the skull from the acoustic neuroma stereotactic radiosurgery.  RTC based upon the findings of the comparison bone scan.   No orders of the defined types were placed in this encounter.  The patient has a good understanding of the overall plan. she agrees with it. she will call with any problems that may develop before the next visit here. Total time spent: 30 mins including face to face time and time spent for planning, charting and co-ordination of care   Harriette Ohara, MD 03/31/22    I Gardiner Coins am scribing for Dr. Lindi Adie  I have reviewed the above documentation for accuracy and completeness, and I agree with the above.

## 2022-03-31 NOTE — Assessment & Plan Note (Addendum)
09/02/1994: Rt Lumpectomy: DCIS 09/21/1994-11/04/1994: XRT (did not receive Anti estrogens)   07/22/2017: T1c N0 Satge 1B: Grade 3 IDC TNBC Ki 67: 50% 08/02/2017: Genetics Neg 08/24/17-11/23/2017:Neo Adj chemo DD AC x 4, Taxol-carbo X 3 (stopped for cytopenias) 12/15/2017: Left Lumpectomy: Path CR 02/07/2018-03/04/2018: XRT   Breast Cancer Surveillance: 1. Breast exam 03/31/2022: Normal 2. Mammogram 08/26/21 No abnormalities. Postsurgical changes. Breast Density Category B. 3.  Bone scan 03/26/2022: Small foci of uptake in the right side of the calvarium, C7 vertebral body, anterior right fifth rib suggesting possible metastatic disease.  Subtle foci of uptake L4 and L5 may be metastatic disease versus degenerative arthritis   I recommended obtaining a biopsy of one of the bone lesions.  I will discuss with radiology

## 2022-04-01 ENCOUNTER — Ambulatory Visit: Payer: Medicare Other | Admitting: Hematology and Oncology

## 2022-04-03 ENCOUNTER — Other Ambulatory Visit: Payer: Self-pay | Admitting: Hematology and Oncology

## 2022-04-03 ENCOUNTER — Telehealth: Payer: Self-pay | Admitting: Hematology and Oncology

## 2022-04-03 DIAGNOSIS — Z171 Estrogen receptor negative status [ER-]: Secondary | ICD-10-CM

## 2022-04-03 NOTE — Telephone Encounter (Signed)
I discussed the radiologist findings from her bone scan.  Upon comparison to the [facility] bone scan nodular changes noted on the current bone scan were noted before.  The right fifth rib could be a bruise.  We will plan to recheck with another bone scan in 6 months and follow-up after that.

## 2022-05-14 ENCOUNTER — Ambulatory Visit (INDEPENDENT_AMBULATORY_CARE_PROVIDER_SITE_OTHER): Payer: Medicare Other | Admitting: Gastroenterology

## 2022-05-14 ENCOUNTER — Encounter: Payer: Self-pay | Admitting: Gastroenterology

## 2022-05-14 VITALS — BP 122/82 | HR 77 | Ht 63.0 in | Wt 122.0 lb

## 2022-05-14 DIAGNOSIS — K642 Third degree hemorrhoids: Secondary | ICD-10-CM

## 2022-05-14 DIAGNOSIS — K219 Gastro-esophageal reflux disease without esophagitis: Secondary | ICD-10-CM

## 2022-05-14 DIAGNOSIS — Z86010 Personal history of colonic polyps: Secondary | ICD-10-CM

## 2022-05-14 MED ORDER — NA SULFATE-K SULFATE-MG SULF 17.5-3.13-1.6 GM/177ML PO SOLN: 1.0000 | Freq: Once | ORAL | 0 refills | Status: AC

## 2022-05-14 NOTE — Progress Notes (Signed)
Assessment     Personal history of adenomatous colon polyps Grade 3 internal hemorrhoids and external hemorrhoid tags GERD Constipation History of H. pylori gastritis treated in 2018   Recommendations    Schedule surveillance colonoscopy. The risks (including bleeding, perforation, infection, missed lesions, medication reactions and possible hospitalization or surgery if complications occur), benefits, and alternatives to colonoscopy with possible biopsy and possible polypectomy were discussed with the patient and they consent to proceed.   Rectal care instructions. 1% hydrocortisone cream topically bid prn, witch hazel topically bid prn, RectiCare topically bid prn. If hemorrhoid symptoms persist follow-up with Dr. Dema Severin Continue omeprazole 40 mg daily and follow antireflux measures Increase Miralax to bid or tid. Senakot qd prn.   HPI    This is a 71 year old female with a personal history of adenomatous colon polyps and hemorrhoids.  She relates infrequent hemorrhoid bleeding and prolapse.  She notes her most persistent symptom related to her external hemorrhoids are frequent pain, itching. She underwent hemorrhoidopexy by Dr. Bubba Camp in 2006.  She was evaluated by Dr. Annye English in [place] and September 2023. Anoscopy performed in [place] 2023 showed 3 columns of grade 3 internal hemorrhoids with external tags.  She has persistent problems with constipation and takes Senokot every few days with hard bowel movements noted.  Colonoscopy Mar 2018 - One 7 mm polyp in the transverse colon, removed with a cold snare. Resected and retrieved. - Two 5 mm polyps at the hepatic flexure and in the cecum, removed with a cold biopsy forceps. Resected and retrieved. - Surgical changes and suture tips noted in the distal rectum - Internal hemorrhoids. - The examination was otherwise normal on direct and retroflexion views.  EGD Mar 2018 - No endoscopic esophageal abnormality to explain patient's  dysphagia. Esophagus dilated.  - Erythematous mucosa in the gastric body and antrum. Biopsied. - Normal duodenal bulb and second portion of the duodenum.  Labs / Imaging       Latest Ref Rng & Units 09/24/2021   10:54 AM 09/25/2020    2:37 PM 08/14/2019    9:18 AM  Hepatic Function  Total Protein 6.5 - 8.1 g/dL 6.7  7.3  6.7   Albumin 3.5 - 5.0 g/dL 3.9  4.3  4.0   AST 15 - 41 U/L _0 ALT 0 - 44 U/L _1 Alk Phosphatase 38 - 126 U/L 51  56  76   Total Bilirubin 0.3 - 1.2 mg/dL 0.7  0.2  0.6        Latest Ref Rng & Units 09/24/2021   10:54 AM 09/25/2020    2:37 PM 08/14/2019    9:18 AM  CBC  WBC 4.0 - 10.5 K/uL 4.3  5.3  3.9   Hemoglobin 12.0 - 15.0 g/dL 12.6  12.9  13.5   Hematocrit 36.0 - 46.0 % 39.1  39.9  41.8   Platelets 150 - 400 K/uL 173  194  172     Current Medications, Allergies, Past Medical History, Past Surgical History, Family History and Social History were reviewed in [employer] record.   Physical Exam: General: Well developed, well nourished, no acute distress Head: Normocephalic and atraumatic Eyes: Sclerae anicteric, EOMI Ears: Normal auditory acuity Mouth: No deformities or lesions noted Lungs: Clear throughout to auscultation Heart: Regular rate and rhythm; No murmurs, rubs or bruits Abdomen: Soft, non tender and non distended. No masses, hepatosplenomegaly  or hernias noted. Normal Bowel sounds Rectal: Deferred to colonoscopy  Musculoskeletal: Symmetrical with no gross deformities  Pulses:  Normal pulses noted Extremities: No edema or deformities noted Neurological: Alert oriented x 4, grossly nonfocal Psychological:  Alert and cooperative. Normal mood and affect   Sheila Ocasio T. Fuller Plan, MD 05/14/2022, 8:51 AM

## 2022-05-14 NOTE — Patient Instructions (Addendum)
Increase your Miralax to titrate to have a adequate bowel movement.   Start over the counter hydrocortisone 1 % cream to apply to rectum as needed for hemorrhoidal symptoms.   You can also use over the counter witch hazel as needed.   RECTAL CARE INSTRUCTIONS:  1. Sitz Baths twice a day for 10 minutes each. 2. Thoroughly clean and dry the rectum. 3. Put Tucks pad against the rectum at night. 4. Clean the rectum with Balenol lotion after each bowel movement.  You have been scheduled for a colonoscopy. Please follow written instructions given to you at your visit today.  Please pick up your prep supplies at the pharmacy within the next 1-3 days. If you use inhalers (even only as needed), please bring them with you on the day of your procedure.   The [facility] providers would like to encourage you to use [facility] to communicate with providers for non-urgent requests or questions.  Due to long hold times on the telephone, sending your provider a message by [facility] may be a faster and more efficient way to get a response.  Please allow 48 business hours for a response.  Please remember that this is for non-urgent requests.   Due to recent changes in healthcare laws, you may see the results of your imaging and laboratory studies on MyChart before your provider has had a chance to review them.  We understand that in some cases there may be results that are confusing or concerning to you. Not all laboratory results come back in the same time frame and the provider may be waiting for multiple results in order to interpret others.  Please give [place] 48 hours in order for your provider to thoroughly review all the results before contacting the office for clarification of your results.   Thank you for choosing me and [facility].  Pricilla Riffle. Dagoberto Ligas., MD., Mar[facility] Regal

## 2022-05-15 ENCOUNTER — Ambulatory Visit: Payer: Medicare Other | Admitting: Obstetrics and Gynecology

## 2022-05-28 DIAGNOSIS — L650 Telogen effluvium: Secondary | ICD-10-CM | POA: Diagnosis not present

## 2022-05-28 DIAGNOSIS — D224 Melanocytic nevi of scalp and neck: Secondary | ICD-10-CM | POA: Diagnosis not present

## 2022-05-28 DIAGNOSIS — R202 Paresthesia of skin: Secondary | ICD-10-CM | POA: Diagnosis not present

## 2022-05-28 DIAGNOSIS — Z86018 Personal history of other benign neoplasm: Secondary | ICD-10-CM | POA: Diagnosis not present

## 2022-05-28 DIAGNOSIS — L821 Other seborrheic keratosis: Secondary | ICD-10-CM | POA: Diagnosis not present

## 2022-05-28 DIAGNOSIS — D3617 Benign neoplasm of peripheral nerves and autonomic nervous system of trunk, unspecified: Secondary | ICD-10-CM | POA: Diagnosis not present

## 2022-05-28 DIAGNOSIS — L658 Other specified nonscarring hair loss: Secondary | ICD-10-CM | POA: Diagnosis not present

## 2022-05-28 DIAGNOSIS — L570 Actinic keratosis: Secondary | ICD-10-CM | POA: Diagnosis not present

## 2022-05-28 DIAGNOSIS — D225 Melanocytic nevi of trunk: Secondary | ICD-10-CM | POA: Diagnosis not present

## 2022-05-28 DIAGNOSIS — L578 Other skin changes due to chronic exposure to nonionizing radiation: Secondary | ICD-10-CM | POA: Diagnosis not present

## 2022-05-28 DIAGNOSIS — D485 Neoplasm of uncertain behavior of skin: Secondary | ICD-10-CM | POA: Diagnosis not present

## 2022-06-11 ENCOUNTER — Encounter: Payer: Self-pay | Admitting: Gastroenterology

## 2022-06-11 ENCOUNTER — Ambulatory Visit (AMBULATORY_SURGERY_CENTER): Payer: Medicare Other | Admitting: Gastroenterology

## 2022-06-11 VITALS — BP 157/78 | HR 61 | Temp 95.9°F | Resp 17 | Ht 63.0 in | Wt 122.0 lb

## 2022-06-11 DIAGNOSIS — Z09 Encounter for follow-up examination after completed treatment for conditions other than malignant neoplasm: Secondary | ICD-10-CM

## 2022-06-11 DIAGNOSIS — Z86010 Personal history of colonic polyps: Secondary | ICD-10-CM

## 2022-06-11 DIAGNOSIS — D123 Benign neoplasm of transverse colon: Secondary | ICD-10-CM

## 2022-06-11 DIAGNOSIS — D124 Benign neoplasm of descending colon: Secondary | ICD-10-CM | POA: Diagnosis not present

## 2022-06-11 DIAGNOSIS — K635 Polyp of colon: Secondary | ICD-10-CM | POA: Diagnosis not present

## 2022-06-11 MED ORDER — SODIUM CHLORIDE 0.9 % IV SOLN: 500.0000 mL | Freq: Once | INTRAVENOUS | Status: DC

## 2022-06-11 NOTE — Progress Notes (Signed)
Vitals-DT  Pt's states no medical or surgical changes since previsit or office visit.  

## 2022-06-11 NOTE — Progress Notes (Signed)
History & Physical  Primary Care Physician:  Tisovec, Fransico Him, MD Primary Gastroenterologist: Lucio Edward, MD  CHIEF COMPLAINT:  Personal history of colon polyps   HPI: Stephanie Bautista is a 72 y.o. female with a personal history of adenomatous colon polyps and Grade 3 internal hemorrhoids and external hemorrhoid tags for colonoscopy.   Past Medical History:  Diagnosis Date   Acoustic neuroma ([place])    Adenomatous colon polyp 12/2002   Arthritis    Breast cancer ([place]) 1996   right breast cancer in 1996, left breast cancer in 2019   Cancer ([place])    Cholelithiases    Esophageal stricture    Family history of breast cancer    Family history of ovarian cancer    Family history of pancreatic cancer    Family history of prostate cancer    GERD (gastroesophageal reflux disease)    Hemorrhoids    Hiatal hernia    History of colon polyps    History of radiation therapy 02/07/18-03/04/2018   Left Breast/ 40.05 Gy in 15 fractions, Left Breast boost/ 10 Gy in 5 fractions.    Inguinal hernia    UTI (lower urinary tract infection)     Past Surgical History:  Procedure Laterality Date   accoustic neuroma     BREAST LUMPECTOMY     BREAST LUMPECTOMY WITH RADIOACTIVE SEED AND SENTINEL LYMPH NODE BIOPSY Left 12/15/2017   Procedure: LEFT BREAST LUMPECTOMY WITH RADIOACTIVE SEED AND LEFT SENTINEL LYMPH NODE BIOPSY, LEFT BREAST SEED GUIDED EXCISIONAL BIOPSY;  Surgeon: Rolm Bookbinder, MD;  Location: [facility];  Service: General;  Laterality: Left;   CESAREAN SECTION     2x   CHOLECYSTECTOMY  2008   HEMORRHOID SURGERY  2007   [place]   PARTIAL HYSTERECTOMY  1996   PORT-A-CATH REMOVAL Right 12/15/2017   Procedure: REMOVAL PORT-A-CATH;  Surgeon: Rolm Bookbinder, MD;  Location: [facility];  Service: General;  Laterality: Right;   PORTACATH PLACEMENT Right 08/05/2017   Procedure: INSERTION PORT-A-CATH WITH [place];  Surgeon: Rolm Bookbinder, MD;  Location: [facility];  Service: General;  Laterality: Right;   SHOULDER SURGERY Right 2005   torn muscle   TONSILLECTOMY      Prior to Admission medications   Medication Sig Start Date End Date Taking? Authorizing Provider  acetaminophen (TYLENOL) 500 MG tablet Take 500 mg by mouth every 6 (six) hours as needed.    [provider]  ascorbic acid (VITAMIN C) 500 MG tablet Take 500 mg by mouth daily.    [provider]  ketoconazole (NIZORAL) 2 % cream Apply 1 application topically daily. 09/25/20   Magrinat, Virgie Dad, MD  Multiple Vitamin (MULTI-VITAMIN) tablet Take 1 tablet by mouth daily.    [provider]  omeprazole (PRILOSEC) 40 MG capsule Take 1 capsule (40 mg total) by mouth 2 (two) times daily. Patient taking differently: Take 40 mg by mouth daily.  10/05/17   Magrinat, Virgie Dad, MD  Polyethylene Glycol 3350 (MIRALAX PO) Take by mouth.    [provider]  pramoxine (CERAVE ITCH RELIEF) 1 % LOTN 1 application as needed 92/4/26   [provider]    Current Outpatient Medications  Medication Sig Dispense Refill   acetaminophen (TYLENOL) 500 MG tablet Take 500 mg by mouth every 6 (six) hours as needed.     ascorbic acid (VITAMIN C) 500 MG tablet Take 500 mg by mouth daily.  ketoconazole (NIZORAL) 2 % cream Apply 1 application topically daily. 15 g 0   Multiple Vitamin (MULTI-VITAMIN) tablet Take 1 tablet by mouth daily.     omeprazole (PRILOSEC) 40 MG capsule Take 1 capsule (40 mg total) by mouth 2 (two) times daily. (Patient taking differently: Take 40 mg by mouth daily. ) 60 capsule 11   Polyethylene Glycol 3350 (MIRALAX PO) Take by mouth.     pramoxine (CERAVE ITCH RELIEF) 1 % LOTN 1 application as needed     Current Facility-Administered Medications  Medication Dose Route Frequency Provider Last Rate Last Admin   0.9 %  sodium chloride infusion  500 mL Intravenous Once Ladene Artist, MD         Allergies as of 06/11/2022 - Review Complete 06/11/2022  Allergen Reaction Noted   Biotin  05/14/2022   Amoxicillin Itching, Rash, and Nausea Only 11/16/2011    Family History  Problem Relation Age of Onset   Heart disease Mother    Melanoma Mother        dx in her 15s   Ovarian cancer Mother 64   Heart disease Father    Diabetes Father    Stroke Father    Ovarian cancer Sister 31   Non-Hodgkin's lymphoma Maternal Grandmother    Heart disease Maternal Grandfather    Heart disease Paternal Grandmother    Prostate cancer Paternal Grandfather    Pancreatic cancer Maternal Aunt    Cancer Maternal Aunt        NOS   Bone cancer Maternal Uncle    Stroke Maternal Uncle    Bone cancer Maternal Uncle    Breast cancer Paternal Aunt    Breast cancer Cousin        daughter of mat aunt with NOS cancer   Colon cancer Neg Hx    Esophageal cancer Neg Hx    Stomach cancer Neg Hx     Social History   Socioeconomic History   Marital status: Married    Spouse name: Not on file   Number of children: 2   Years of education: Not on file   Highest education level: Not on file  Occupational History   Occupation: office    Employer: [employer]   Occupation: retired  Tobacco Use   Smoking status: Never   Smokeless tobacco: Never  Scientific laboratory technician Use: Never used  Substance and Sexual Activity   Alcohol use: No   Drug use: No   Sexual activity: Not on file  Other Topics Concern   Not on file  Social History Narrative   Not on file   Social Determinants of Health   Financial Resource Strain: Not on file  Food Insecurity: Not on file  Transportation Needs: No Transportation Needs (04/08/2018)   PRAPARE - Hydrologist (Medical): No    Lack of Transportation (Non-Medical): No  Physical Activity: Not on file  Stress: Not on file  Social Connections: Not on file  Intimate Partner Violence: Not At Risk (01/18/2018)   Humiliation, Afraid, Rape,  and Kick questionnaire    Fear of Current or Ex-Partner: No    Emotionally Abused: No    Physically Abused: No    Sexually Abused: No    Review of Systems:  All systems reviewed were negative except where noted in HPI.   Physical Exam: General:  Alert, well-developed, in NAD Head:  Normocephalic and atraumatic. Eyes:  Sclera clear, no icterus.   Conjunctiva  pink. Ears:  Normal auditory acuity. Mouth:  No deformity or lesions.  Neck:  Supple; no masses . Lungs:  Clear throughout to auscultation.   No wheezes, crackles, or rhonchi. No acute distress. Heart:  Regular rate and rhythm; no murmurs. Abdomen:  Soft, nondistended, nontender. No masses, hepatomegaly. No obvious masses.  Normal bowel .    Rectal:  Deferred   Msk:  Symmetrical without gross deformities.. Pulses:  Normal pulses noted. Extremities:  Without edema. Neurologic:  Alert and  oriented x4;  grossly normal neurologically. Skin:  Intact without significant lesions or rashes. Psych:  Alert and cooperative. Normal mood and affect.   Impression / Plan:   Personal history of adenomatous colon polyps and Grade 3 internal hemorrhoids and external hemorrhoid tags for colonoscopy.  Pricilla Riffle. Fuller Plan  06/11/2022, 11:36 AM See Shea Evans, [facility], to contact our on call provider

## 2022-06-11 NOTE — Op Note (Signed)
[place] Patient Name: Stephanie Bautista Procedure Date: 06/11/2022 11:17 AM MRN: [id number] Endoscopist: Ladene Artist , MD, [id number] Age: 72 Referring MD:  Date of Birth: [birth year] Gender: Female Account #: [id number] Procedure:                Colonoscopy Indications:              Surveillance: Personal history of adenomatous                            polyps on last colonoscopy 5 years ago Medicines:                Monitored Anesthesia Care Procedure:                Pre-Anesthesia Assessment:                           - Prior to the procedure, a History and Physical                            was performed, and patient medications and                            allergies were reviewed. The patient's tolerance of                            previous anesthesia was also reviewed. The risks                            and benefits of the procedure and the sedation                            options and risks were discussed with the patient.                            All questions were answered, and informed consent                            was obtained. Prior Anticoagulants: The patient has                            taken no anticoagulant or antiplatelet agents. ASA                            Grade Assessment: II - A patient with mild systemic                            disease. After reviewing the risks and benefits,                            the patient was deemed in satisfactory condition to                            undergo the procedure.  After obtaining informed consent, the colonoscope                            was passed under direct vision. Throughout the                            procedure, the patient's blood pressure, pulse, and                            oxygen saturations were monitored continuously. The                            Olympus PCF-H190DL (#[id number]) Colonoscope was                            introduced through the  anus and advanced to the the                            cecum, identified by appendiceal orifice and                            ileocecal valve. The ileocecal valve, appendiceal                            orifice, and rectum were photographed. The quality                            of the bowel preparation was good. The colonoscopy                            was performed without difficulty. The patient                            tolerated the procedure well. Scope In: 11:38:58 AM Scope Out: 11:57:07 AM Scope Withdrawal Time: 0 hours 14 minutes 30 seconds  Total Procedure Duration: 0 hours 18 minutes 9 seconds  Findings:                 Skin tags were found on perianal exam.                           Four sessile polyps were found in the descending                            colon, transverse colon and hepatic flexure (2).                            The polyps were 5 to 8 mm in size. These polyps                            were removed with a cold snare. Resection and                            retrieval were complete.  There was evidence of a prior surgery in the distal                            rectum. This was patent and was characterized by                            healthy appearing mucosa. Two staples were noted.                           Internal hemorrhoids were found during                            retroflexion. The hemorrhoids were Grade III                            (internal hemorrhoids that prolapse but require                            manual reduction).                           The exam was otherwise without abnormality on                            direct and retroflexion views. Complications:            No immediate complications. Estimated blood loss:                            None. Estimated Blood Loss:     Estimated blood loss: none. Impression:               Skin tags were found on perianal exam.                           There was  evidence of a prior surgery in the distal                            rectum. Two staples noted.                           Four sessile polyps in the descending colon,                            hepatic flexure and transverse colon. Removed and                            retrieved.                           Internal hemorrhoids, Grade III.                           The exam was otherwise without abnormality on  direct and retroflexion views. Recommendation:           - Repeat colonoscopy vs no repeat due to age after                            studies are complete for surveillance based on                            pathology results.                           - Patient has a contact number available for                            emergencies. The signs and symptoms of potential                            delayed complications were discussed with the                            patient. Return to normal activities tomorrow.                            Written discharge instructions were provided to the                            patient.                           - Resume previous diet.                           - Continue present medications.                           - Await pathology results. Ladene Artist, MD 06/11/2022 12:06:01 PM This report has been signed electronically.

## 2022-06-11 NOTE — Progress Notes (Signed)
Report to PACU, RN, vss, BBS= Clear.  

## 2022-06-11 NOTE — Patient Instructions (Signed)
Handouts on hemorrhoids and polyps given to patient. Await pathology results. Resume previous diet and continue present medications. Repeat vs no repeat colonoscopy for surveillance due to age - can discuss further with Dr. Fuller Plan    YOU HAD AN ENDOSCOPIC PROCEDURE TODAY AT THE [facility]:   Refer to the procedure report that was given to you for any specific questions about what was found during the examination.  If the procedure report does not answer your questions, please call your gastroenterologist to clarify.  If you requested that your care partner not be given the details of your procedure findings, then the procedure report has been included in a sealed envelope for you to review at your convenience later.  YOU SHOULD EXPECT: Some feelings of bloating in the abdomen. Passage of more gas than usual.  Walking can help get rid of the air that was put into your GI tract during the procedure and reduce the bloating. If you had a lower endoscopy (such as a colonoscopy or flexible sigmoidoscopy) you may notice spotting of blood in your stool or on the toilet paper. If you underwent a bowel prep for your procedure, you may not have a normal bowel movement for a few days.  Please Note:  You might notice some irritation and congestion in your nose or some drainage.  This is from the oxygen used during your procedure.  There is no need for concern and it should clear up in a day or so.  SYMPTOMS TO REPORT IMMEDIATELY:  Following lower endoscopy (colonoscopy or flexible sigmoidoscopy):  Excessive amounts of blood in the stool  Significant tenderness or worsening of abdominal pains  Swelling of the abdomen that is new, acute  Fever of 100F or higher  For urgent or emergent issues, a gastroenterologist can be reached at any hour by calling [phone]. Do not use MyChart messaging for urgent concerns.    DIET:  We do recommend a small meal at first, but then you may proceed to  your regular diet.  Drink plenty of fluids but you should avoid alcoholic beverages for 24 hours.  ACTIVITY:  You should plan to take it easy for the rest of today and you should NOT DRIVE or use heavy machinery until tomorrow (because of the sedation medicines used during the test).    FOLLOW UP: Our staff will call the number listed on your records the next business day following your procedure.  We will call around 7:15- 8:00 am to check on you and address any questions or concerns that you may have regarding the information given to you following your procedure. If we do not reach you, we will leave a message.     If any biopsies were taken you will be contacted by phone or by letter within the next 1-3 weeks.  Please call us at [phone] if you have not heard about the biopsies in 3 weeks.    SIGNATURES/CONFIDENTIALITY: You and/or your care partner have signed paperwork which will be entered into your electronic medical record.  These signatures attest to the fact that that the information above on your After Visit Summary has been reviewed and is understood.  Full responsibility of the confidentiality of this discharge information lies with you and/or your care-partner.

## 2022-06-11 NOTE — Progress Notes (Signed)
Called to room to assist during endoscopic procedure.  Patient ID and intended procedure confirmed with present staff. Received instructions for my participation in the procedure from the performing physician.  

## 2022-06-12 ENCOUNTER — Telehealth: Payer: Self-pay | Admitting: *Deleted

## 2022-06-12 NOTE — Telephone Encounter (Signed)
  Follow up Call-     06/11/2022   10:56 AM 06/11/2022   10:48 AM  Call back number  Post procedure Call Back phone  # [phone]   Permission to leave phone message  Yes     Patient questions:  Do you have a fever, pain , or abdominal swelling? No. Pain Score  0 *  Have you tolerated food without any problems? Yes.    Have you been able to return to your normal activities? Yes.    Do you have any questions about your discharge instructions: Diet   No. Medications  No. Follow up visit  No.  Do you have questions or concerns about your Care? No.  Actions: * If pain score is 4 or above: No action needed, pain <4.

## 2022-06-25 ENCOUNTER — Encounter: Payer: Self-pay | Admitting: Gastroenterology

## 2022-06-29 ENCOUNTER — Encounter (HOSPITAL_BASED_OUTPATIENT_CLINIC_OR_DEPARTMENT_OTHER): Payer: Self-pay | Admitting: Surgery

## 2022-06-29 DIAGNOSIS — K625 Hemorrhage of anus and rectum: Secondary | ICD-10-CM | POA: Diagnosis not present

## 2022-06-29 DIAGNOSIS — K644 Residual hemorrhoidal skin tags: Secondary | ICD-10-CM | POA: Diagnosis not present

## 2022-06-29 DIAGNOSIS — K642 Third degree hemorrhoids: Secondary | ICD-10-CM | POA: Diagnosis not present

## 2022-06-29 NOTE — Progress Notes (Signed)
Spoke w/ via phone for pre-op interview--- Stephanie Bautista needs dos---- NONE              Bautista results------ COVID test -----patient states asymptomatic no test needed Arrive at -------0530 NPO after MN NO Solid Food.   Med rec completed Medications to take morning of surgery -----NONE Diabetic medication ----- Patient instructed no nail polish to be worn day of surgery Patient instructed to bring photo id and insurance card day of surgery Patient aware to have Driver (ride ) / caregiver Husband Shealyn Sean   for 24 hours after surgery  Patient Special Instructions ----- Pre-Op special Istructions ----- Patient verbalized understanding of instructions that were given at this phone interview. Patient denies shortness of breath, chest pain, fever, cough at this phone interview.

## 2022-07-02 ENCOUNTER — Ambulatory Visit: Payer: Self-pay | Admitting: Surgery

## 2022-07-02 NOTE — Anesthesia Preprocedure Evaluation (Addendum)
Anesthesia Evaluation  Patient identified by MRN, date of birth, ID band Patient awake    Reviewed: Allergy & Precautions, H&P , NPO status , Patient's Chart, lab work & pertinent test results  Airway Mallampati: II  TM Distance: >3 FB Neck ROM: Full    Dental no notable dental hx.    Pulmonary neg pulmonary ROS   Pulmonary exam normal breath sounds clear to auscultation       Cardiovascular Exercise Tolerance: Good negative cardio ROS Normal cardiovascular exam Rhythm:Regular Rate:Normal     Neuro/Psych  Neuromuscular disease negative neurological ROS  negative psych ROS   GI/Hepatic negative GI ROS, Neg liver ROS, hiatal hernia,GERD  ,Patient received Oral Contrast Agents,  Endo/Other  negative endocrine ROS    Renal/GU negative Renal ROS  negative genitourinary   Musculoskeletal negative musculoskeletal ROS (+) Arthritis ,    Abdominal   Peds negative pediatric ROS (+)  Hematology negative hematology ROS (+)   Anesthesia Other Findings Lbreast CA S/P rad tx  Reproductive/Obstetrics negative OB ROS                             Anesthesia Physical Anesthesia Plan  ASA: 2  Anesthesia Plan: General   Post-op Pain Management: Tylenol PO (pre-op)* and Precedex   Induction: Intravenous  PONV Risk Score and Plan: Treatment may vary due to age or medical condition, Ondansetron and Midazolam  Airway Management Planned: Oral ETT  Additional Equipment: None  Intra-op Plan:   Post-operative Plan: Extubation in OR  Informed Consent: I have reviewed the patients History and Physical, chart, labs and discussed the procedure including the risks, benefits and alternatives for the proposed anesthesia with the patient or authorized representative who has indicated his/her understanding and acceptance.     Dental advisory given  Plan Discussed with: CRNA, Anesthesiologist and  Surgeon  Anesthesia Plan Comments:         Anesthesia Quick Evaluation

## 2022-07-03 ENCOUNTER — Ambulatory Visit (HOSPITAL_BASED_OUTPATIENT_CLINIC_OR_DEPARTMENT_OTHER)
Admission: RE | Admit: 2022-07-03 | Discharge: 2022-07-03 | Disposition: A | Discharge status: Home / Self Care | Payer: Medicare Other | Attending: Surgery | Admitting: Surgery

## 2022-07-03 ENCOUNTER — Ambulatory Visit (HOSPITAL_BASED_OUTPATIENT_CLINIC_OR_DEPARTMENT_OTHER): Payer: Medicare Other | Admitting: Anesthesiology

## 2022-07-03 ENCOUNTER — Encounter (HOSPITAL_BASED_OUTPATIENT_CLINIC_OR_DEPARTMENT_OTHER)
Admission: RE | Disposition: A | Discharge status: Home / Self Care | Payer: Self-pay | Source: Home / Self Care | Attending: Surgery

## 2022-07-03 ENCOUNTER — Encounter (HOSPITAL_BASED_OUTPATIENT_CLINIC_OR_DEPARTMENT_OTHER): Payer: Self-pay | Admitting: Surgery

## 2022-07-03 ENCOUNTER — Other Ambulatory Visit: Payer: Self-pay

## 2022-07-03 DIAGNOSIS — K644 Residual hemorrhoidal skin tags: Secondary | ICD-10-CM | POA: Insufficient documentation

## 2022-07-03 DIAGNOSIS — M1990 Unspecified osteoarthritis, unspecified site: Secondary | ICD-10-CM | POA: Diagnosis not present

## 2022-07-03 DIAGNOSIS — K642 Third degree hemorrhoids: Secondary | ICD-10-CM | POA: Diagnosis not present

## 2022-07-03 DIAGNOSIS — K649 Unspecified hemorrhoids: Secondary | ICD-10-CM

## 2022-07-03 DIAGNOSIS — K625 Hemorrhage of anus and rectum: Secondary | ICD-10-CM | POA: Diagnosis not present

## 2022-07-03 DIAGNOSIS — K449 Diaphragmatic hernia without obstruction or gangrene: Secondary | ICD-10-CM | POA: Diagnosis not present

## 2022-07-03 DIAGNOSIS — K219 Gastro-esophageal reflux disease without esophagitis: Secondary | ICD-10-CM | POA: Diagnosis not present

## 2022-07-03 HISTORY — PX: HEMORRHOID SURGERY: SHX153

## 2022-07-03 HISTORY — PX: RECTAL EXAM UNDER ANESTHESIA: SHX6399

## 2022-07-03 SURGERY — HEMORRHOIDECTOMY
Anesthesia: General | Site: Rectum

## 2022-07-03 MED ORDER — MIDAZOLAM HCL 5 MG/5ML IJ SOLN
INTRAMUSCULAR | Status: DC | PRN
  Administered 2022-07-03: 2 mg via INTRAVENOUS

## 2022-07-03 MED ORDER — LIDOCAINE 2% (20 MG/ML) 5 ML SYRINGE
INTRAMUSCULAR | Status: DC | PRN
  Administered 2022-07-03: 80 mg via INTRAVENOUS

## 2022-07-03 MED ORDER — PROPOFOL 10 MG/ML IV BOLUS
INTRAVENOUS | Status: DC | PRN
  Administered 2022-07-03: 120 mg via INTRAVENOUS

## 2022-07-03 MED ORDER — 0.9 % SODIUM CHLORIDE (POUR BTL) OPTIME
TOPICAL | Status: DC | PRN
  Administered 2022-07-03: 500 mL

## 2022-07-03 MED ORDER — BUPIVACAINE LIPOSOME 1.3 % IJ SUSP
INTRAMUSCULAR | Status: DC | PRN
  Administered 2022-07-03: 40 mL

## 2022-07-03 MED ORDER — CEFAZOLIN SODIUM-DEXTROSE 2-4 GM/100ML-% IV SOLN
INTRAVENOUS | Status: AC
  Filled 2022-07-03: qty 100

## 2022-07-03 MED ORDER — LIDOCAINE HCL (PF) 2 % IJ SOLN
INTRAMUSCULAR | Status: AC
  Filled 2022-07-03: qty 5

## 2022-07-03 MED ORDER — FENTANYL CITRATE (PF) 100 MCG/2ML IJ SOLN
INTRAMUSCULAR | Status: AC
  Filled 2022-07-03: qty 2

## 2022-07-03 MED ORDER — TRAMADOL HCL 50 MG PO TABS: 50.0000 mg | ORAL_TABLET | Freq: Four times a day (QID) | ORAL | 0 refills | Status: AC | PRN

## 2022-07-03 MED ORDER — ROCURONIUM BROMIDE 10 MG/ML (PF) SYRINGE
PREFILLED_SYRINGE | INTRAVENOUS | Status: AC
  Filled 2022-07-03: qty 10

## 2022-07-03 MED ORDER — FENTANYL CITRATE (PF) 100 MCG/2ML IJ SOLN: 25.0000 ug | INTRAMUSCULAR | Status: DC | PRN

## 2022-07-03 MED ORDER — POLYETHYLENE GLYCOL 3350 17 G PO PACK: 17.0000 g | PACK | Freq: Two times a day (BID) | ORAL | 1 refills | Status: AC

## 2022-07-03 MED ORDER — ONDANSETRON HCL 4 MG/2ML IJ SOLN
INTRAMUSCULAR | Status: AC
  Filled 2022-07-03: qty 2

## 2022-07-03 MED ORDER — ONDANSETRON HCL 4 MG/2ML IJ SOLN: 4.0000 mg | Freq: Once | INTRAMUSCULAR | Status: DC | PRN

## 2022-07-03 MED ORDER — DEXAMETHASONE SODIUM PHOSPHATE 10 MG/ML IJ SOLN
INTRAMUSCULAR | Status: AC
  Filled 2022-07-03: qty 1

## 2022-07-03 MED ORDER — LACTATED RINGERS IV SOLN: INTRAVENOUS | Status: DC

## 2022-07-03 MED ORDER — BUPIVACAINE LIPOSOME 1.3 % IJ SUSP: 20.0000 mL | Freq: Once | INTRAMUSCULAR | Status: DC

## 2022-07-03 MED ORDER — SUGAMMADEX SODIUM 200 MG/2ML IV SOLN
INTRAVENOUS | Status: DC | PRN
  Administered 2022-07-03: 200 mg via INTRAVENOUS

## 2022-07-03 MED ORDER — ACETAMINOPHEN 10 MG/ML IV SOLN: 1000.0000 mg | Freq: Once | INTRAVENOUS | Status: DC | PRN

## 2022-07-03 MED ORDER — ROCURONIUM BROMIDE 10 MG/ML (PF) SYRINGE
PREFILLED_SYRINGE | INTRAVENOUS | Status: DC | PRN
  Administered 2022-07-03: 40 mg via INTRAVENOUS

## 2022-07-03 MED ORDER — PROPOFOL 10 MG/ML IV BOLUS
INTRAVENOUS | Status: AC
  Filled 2022-07-03: qty 20

## 2022-07-03 MED ORDER — DIBUCAINE (PERIANAL) 1 % EX OINT
TOPICAL_OINTMENT | CUTANEOUS | Status: DC | PRN
  Administered 2022-07-03: 1 via RECTAL

## 2022-07-03 MED ORDER — CEFAZOLIN SODIUM-DEXTROSE 2-4 GM/100ML-% IV SOLN
2.0000 g | INTRAVENOUS | Status: AC
  Administered 2022-07-03: 2 g via INTRAVENOUS

## 2022-07-03 MED ORDER — ONDANSETRON HCL 4 MG/2ML IJ SOLN
INTRAMUSCULAR | Status: DC | PRN
  Administered 2022-07-03: 4 mg via INTRAVENOUS

## 2022-07-03 MED ORDER — DEXAMETHASONE SODIUM PHOSPHATE 10 MG/ML IJ SOLN
INTRAMUSCULAR | Status: DC | PRN
  Administered 2022-07-03: 4 mg via INTRAVENOUS

## 2022-07-03 MED ORDER — FENTANYL CITRATE (PF) 100 MCG/2ML IJ SOLN
INTRAMUSCULAR | Status: DC | PRN
  Administered 2022-07-03 (×2): 50 ug via INTRAVENOUS

## 2022-07-03 MED ORDER — MIDAZOLAM HCL 2 MG/2ML IJ SOLN
INTRAMUSCULAR | Status: AC
  Filled 2022-07-03: qty 2

## 2022-07-03 SURGICAL SUPPLY — 71 items
APL SKNCLS STERI-STRIP NONHPOA (GAUZE/BANDAGES/DRESSINGS) ×1
BENZOIN TINCTURE PRP APPL 2/3 (GAUZE/BANDAGES/DRESSINGS) ×1 IMPLANT
BLADE EXTENDED COATED 6.5IN (ELECTRODE) ×1 IMPLANT
BLADE SURG 10 STRL SS (BLADE) IMPLANT
BLADE SURG 15 STRL LF DISP TIS (BLADE) ×1 IMPLANT
BLADE SURG 15 STRL SS (BLADE) ×1
BRIEF MESH DISP LRG (UNDERPADS AND DIAPERS) ×1 IMPLANT
COVER BACK TABLE 60X90IN (DRAPES) ×1 IMPLANT
COVER MAYO STAND STRL (DRAPES) ×1 IMPLANT
DRAPE HYSTEROSCOPY (MISCELLANEOUS) IMPLANT
DRAPE LAPAROTOMY 100X72 PEDS (DRAPES) ×1 IMPLANT
DRAPE SHEET LG 3/4 BI-LAMINATE (DRAPES) IMPLANT
DRAPE UTILITY XL STRL (DRAPES) ×1 IMPLANT
ELECT REM PT RETURN 9FT ADLT (ELECTROSURGICAL) ×1
ELECTRODE REM PT RTRN 9FT ADLT (ELECTROSURGICAL) ×1 IMPLANT
GAUZE 4X4 16PLY ~~LOC~~+RFID DBL (SPONGE) ×1 IMPLANT
GAUZE PAD ABD 8X10 STRL (GAUZE/BANDAGES/DRESSINGS) ×1 IMPLANT
GAUZE SPONGE 4X4 12PLY STRL (GAUZE/BANDAGES/DRESSINGS) ×1 IMPLANT
GLOVE BIO SURGEON STRL SZ7.5 (GLOVE) ×1 IMPLANT
GLOVE BIOGEL PI IND STRL 7.0 (GLOVE) IMPLANT
GLOVE INDICATOR 8.0 STRL GRN (GLOVE) ×1 IMPLANT
GOWN STRL REUS W/TWL LRG LVL3 (GOWN DISPOSABLE) IMPLANT
GOWN STRL REUS W/TWL XL LVL3 (GOWN DISPOSABLE) ×1 IMPLANT
HYDROGEN PEROXIDE 16OZ (MISCELLANEOUS) IMPLANT
IV CATH 14GX2 1/4 (CATHETERS) IMPLANT
IV CATH 18G SAFETY (IV SOLUTION) IMPLANT
KIT SIGMOIDOSCOPE (SET/KITS/TRAYS/PACK) IMPLANT
KIT TURNOVER CYSTO (KITS) ×1 IMPLANT
LEGGING LITHOTOMY PAIR STRL (DRAPES) IMPLANT
LIGASURE 7.4 SM JAW OPEN (ELECTROSURGICAL) IMPLANT
LOOP VASCLR MAXI BLUE 18IN ST (MISCELLANEOUS) IMPLANT
LOOP VASCULAR MAXI 18 BLUE (MISCELLANEOUS)
LOOPS VASCLR MAXI BLUE 18IN ST (MISCELLANEOUS) IMPLANT
NDL HYPO 25X1 1.5 SAFETY (NEEDLE) IMPLANT
NDL SAFETY ECLIP 18X1.5 (MISCELLANEOUS) IMPLANT
NEEDLE HYPO 22GX1.5 SAFETY (NEEDLE) ×1 IMPLANT
NEEDLE HYPO 25X1 1.5 SAFETY (NEEDLE) IMPLANT
NS IRRIG 500ML POUR BTL (IV SOLUTION) ×1 IMPLANT
PACK BASIN DAY SURGERY FS (CUSTOM PROCEDURE TRAY) ×1 IMPLANT
PAD ARMBOARD 7.5X6 YLW CONV (MISCELLANEOUS) IMPLANT
PENCIL SMOKE EVACUATOR (MISCELLANEOUS) ×1 IMPLANT
SPIKE FLUID TRANSFER (MISCELLANEOUS) ×1 IMPLANT
SPONGE HEMORRHOID 8X3CM (HEMOSTASIS) IMPLANT
SPONGE SURGIFOAM ABS GEL 100 (HEMOSTASIS) IMPLANT
SPONGE SURGIFOAM ABS GEL 12-7 (HEMOSTASIS) IMPLANT
SUT CHROMIC 2 0 SH (SUTURE) IMPLANT
SUT CHROMIC 3 0 SH 27 (SUTURE) IMPLANT
SUT MNCRL AB 4-0 PS2 18 (SUTURE) IMPLANT
SUT SILK 0 TIES 10X30 (SUTURE) ×1 IMPLANT
SUT SILK 2 0 (SUTURE)
SUT SILK 2 0 SH (SUTURE) ×1 IMPLANT
SUT SILK 2-0 18XBRD TIE 12 (SUTURE) IMPLANT
SUT VIC AB 2-0 SH 27 (SUTURE)
SUT VIC AB 2-0 SH 27XBRD (SUTURE) IMPLANT
SUT VIC AB 2-0 UR6 27 (SUTURE) ×1 IMPLANT
SUT VIC AB 3-0 SH 18 (SUTURE) IMPLANT
SUT VIC AB 3-0 SH 27 (SUTURE) ×2
SUT VIC AB 3-0 SH 27X BRD (SUTURE) ×1 IMPLANT
SUT VIC AB 3-0 SH 27XBRD (SUTURE) IMPLANT
SUT VIC AB 4-0 P-3 18XBRD (SUTURE) IMPLANT
SUT VIC AB 4-0 P3 18 (SUTURE)
SYR 20ML LL LF (SYRINGE) IMPLANT
SYR BULB EAR ULCER 3OZ GRN STR (SYRINGE) IMPLANT
SYR BULB IRRIG 60ML STRL (SYRINGE) ×1 IMPLANT
SYR CONTROL 10ML LL (SYRINGE) ×1 IMPLANT
SYR TB 1ML LL NO SAFETY (SYRINGE) IMPLANT
TOWEL OR 17X26 10 PK STRL BLUE (TOWEL DISPOSABLE) ×1 IMPLANT
TRAY DSU PREP LF (CUSTOM PROCEDURE TRAY) ×1 IMPLANT
TUBE CONNECTING 12X1/4 (SUCTIONS) ×1 IMPLANT
VASCULAR TIE MAXI BLUE 18IN ST (MISCELLANEOUS)
YANKAUER SUCT BULB TIP NO VENT (SUCTIONS) ×1 IMPLANT

## 2022-07-03 NOTE — Discharge Instructions (Addendum)
ANORECTAL SURGERY: POST OP INSTRUCTIONS  DIET: Follow a light bland diet the first 24 hours after arrival home, such as soup, liquids, crackers, etc.  Be sure to include lots of fluids daily.  Avoid fast food or heavy meals as your are more likely to get nauseated.  Eat a low fat diet the next few days after surgery.   Some bleeding with bowel movements is expected for the first couple of days but this should stop in between bowel movements  Take your usually prescribed home medications unless otherwise directed. No foreign bodies per rectum for the next 3 months (enemas, etc)  PAIN CONTROL: It is helpful to take an over-the-counter pain medication regularly for the first few days/weeks.  Choose from the following that works best for you: Ibuprofen (Advil, etc) Three 200mg tabs every 6 hours as needed. Acetaminophen (Tylenol, etc) 500-650mg every 6 hours as needed NOTE: You may take both of these medications together - most patients find it most helpful when alternating between the two (i.e. Ibuprofen at 6am, tylenol at 9am, ibuprofen at 12pm ...) A  prescription for pain medication may have been prescribed for you at discharge.  Take your pain medication as prescribed.  If you are having problems/concerns with the prescription medicine, please call us for further advice.  Avoid getting constipated.  Between the surgery and the pain medications, it is common to experience some constipation.  Increasing fluid intake (64oz of water per day) and taking a fiber supplement (such as Metamucil, Citrucel, FiberCon) 1-2 times a day regularly will usually help prevent this problem from occurring.  Take Miralax (over the counter) 1-2x/day while taking a narcotic pain medication. If no bowel movement after 48hours, you may additionally take a laxative like a bottle of Milk of Magnesia which can be purchased over the counter. Avoid enemas.   Watch out for diarrhea.  If you have many loose bowel movements,  simplify your diet to bland foods.  Stop any stool softeners and decrease your fiber supplement. If this worsens or does not improve, please call us.  Wash / shower every day.  If you were discharged with a dressing, you may remove this the day after your surgery. You may shower normally, getting soap/water on your wound, particularly after bowel movements.  Soaking in a warm bath filled a couple inches ("Sitz bath") is a great way to clean the area after a bowel movement and many patients find it is a way to soothe the area.  ACTIVITIES as tolerated:   You may resume regular (light) daily activities beginning the next day--such as daily self-care, walking, climbing stairs--gradually increasing activities as tolerated.  If you can walk 30 minutes without difficulty, it is safe to try more intense activity such as jogging, treadmill, bicycling, low-impact aerobics, etc. Refrain from any heavy lifting or straining for the first 2 weeks after your procedure, particularly if your surgery was for hemorrhoids. Avoid activities that make your pain worse You may drive when you are no longer taking prescription pain medication, you can comfortably wear a seatbelt, and you can safely maneuver your car and apply brakes.  FOLLOW UP in our office Please call [facility] at [phone] to set up an appointment to see your surgeon in the office for a follow-up appointment approximately 2 weeks after your surgery. Make sure that you call for this appointment the day you arrive home to insure a convenient appointment time.  9. If you have disability or family leave forms   that need to be completed, you may have them completed by your primary care physician's office; for return to work instructions, please ask our office staff and they will be happy to assist you in obtaining this documentation   When to call us [phone]: Poor pain control Reactions / problems with new medications (rash/itching, etc)  Fever over  101.5 F (38.5 C) Inability to urinate Nausea/vomiting Worsening swelling or bruising Continued bleeding from incision. Increased pain, redness, or drainage from the incision  The clinic staff is available to answer your questions during regular business hours (8:30am-5pm).  Please don't hesitate to call and ask to speak to one of our nurses for clinical concerns.   A surgeon from [facility] is always on call at the hospitals   If you have a medical emergency, go to the nearest emergency room or call 911.   [facility] [address] [place], [place], [place]  [address] MAIN: [phone] FAX: [phone] [email or web address]  Post Anesthesia Home Care Instructions  Activity: Get plenty of rest for the remainder of the day. A responsible adult should stay with you for 24 hours following the procedure.  For the next 24 hours, DO NOT: -Drive a car -Paediatric nurse -Drink alcoholic beverages -Take any medication unless instructed by your physician -Make any legal decisions or sign important papers.  Meals: Start with liquid foods such as gelatin or soup. Progress to regular foods as tolerated. Avoid greasy, spicy, heavy foods. If nausea and/or vomiting occur, drink only clear liquids until the nausea and/or vomiting subsides. Call your physician if vomiting continues.  Special Instructions/Symptoms: Your throat may feel dry or sore from the anesthesia or the breathing tube placed in your throat during surgery. If this causes discomfort, gargle with warm salt water. The discomfort should disappear within 24 hours.    Information for Discharge Teaching: EXPAREL (bupivacaine liposome injectable suspension)   Your surgeon gave you EXPAREL(bupivacaine) in your surgical incision to help control your pain after surgery.  EXPAREL is a local anesthetic that provides pain relief by numbing the tissue around the  surgical site. EXPAREL is designed to release pain medication over time and can control pain for up to 72 hours. Depending on how you respond to EXPAREL, you may require less pain medication during your recovery.  Possible side effects: Temporary loss of sensation or ability to move in the area where bupivacaine was injected. Nausea, vomiting, constipation Rarely, numbness and tingling in your mouth or lips, lightheadedness, or anxiety may occur. Call your doctor right away if you think you may be experiencing any of these sensations, or if you have other questions regarding possible side effects.  Follow all other discharge instructions given to you by your surgeon or nurse. Eat a healthy diet and drink plenty of water or other fluids.  If you return to the hospital for any reason within 96 hours following the administration of EXPAREL, please inform your health care providers.

## 2022-07-03 NOTE — Anesthesia Postprocedure Evaluation (Signed)
Anesthesia Post Note  Patient: Stephanie Bautista  Procedure(s) Performed: HEMORRHOIDECTOMY (Rectum) RECTAL EXAM UNDER ANESTHESIA (Rectum)     Patient location during evaluation: PACU Anesthesia Type: General Level of consciousness: awake and alert Pain management: pain level controlled Vital Signs Assessment: post-procedure vital signs reviewed and stable Respiratory status: spontaneous breathing, nonlabored ventilation, respiratory function stable and patient connected to nasal cannula oxygen Cardiovascular status: blood pressure returned to baseline and stable Postop Assessment: no apparent nausea or vomiting Anesthetic complications: no  No notable events documented.  Last Vitals:  Vitals:   07/03/22 0948 07/03/22 1015  BP:  (!) 172/78  Pulse: (!) 58 72  Resp: 15 16  Temp: 36.6 C   SpO2: 98% 100%    Last Pain:  Vitals:   07/03/22 1015  TempSrc:   PainSc: 0-No pain                 Barnet Glasgow

## 2022-07-03 NOTE — Transfer of Care (Signed)
Immediate Anesthesia Transfer of Care Note  Patient: Stephanie Bautista  Procedure(s) Performed: HEMORRHOIDECTOMY (Rectum) RECTAL EXAM UNDER ANESTHESIA (Rectum)  Patient Location: PACU  Anesthesia Type:General  Level of Consciousness: drowsy, patient cooperative, and responds to stimulation  Airway & Oxygen Therapy: Patient Spontanous Breathing  Post-op Assessment: Report given to RN and Post -op Vital signs reviewed and stable  Post vital signs: Reviewed and stable  Last Vitals:  Vitals Value Taken Time  BP 178/71 07/03/22 0837  Temp    Pulse 64 07/03/22 0838  Resp 9 07/03/22 0837  SpO2 99 % 07/03/22 0838  Vitals shown include unvalidated device data.  Last Pain:  Vitals:   07/03/22 0556  TempSrc: Oral  PainSc: 0-No pain      Patients Stated Pain Goal: 5 ([id number])  Complications: No notable events documented.

## 2022-07-03 NOTE — H&P (Signed)
CC: Here today for surgery  HPI: Stephanie Bautista is an 72 y.o. female with history of GERD, whom is seen in the office today for possible hemorrhoids.  History of stapled hemorrhoidopexy 04/15/2005 by Dr. Kathrin Penner.   She for a number of months/years now has dealt with external tags and some prolapsing tissue with bowel movements. She has a bowel movement every 3-4 days and struggles with constipation. She takes Sennakot every 3 days. She does not take a fiber supplement. Her main issue with all of this has been ongoing perianal itching. She denies any incontinence to gas, liquid, or solid stool. She does not wear a pad or diaper. She spends approximately 2 to 5 minutes on the commode to have a bowel movement and often times drinks only 16 ounces of water per day.  She has not started taking any of her MiraLAX or fiber supplement. She returns for follow-up. She continues to drink about 60 ounces of water per day. She still has some degree of perianal itching/burning. She does have notable tags and some prolapsing internal hemorrhoidal tissue. She is in the process of scheduling a colonoscopy.   She does also note today some issues with urinary incontinence. She will also have to get up at least 5 times at night to go to the restroom. Substantial quality of life related issues regarding her urinary issues.  Cscope with Dr. Fuller Plan 06/11/22 -  Skin tags on perianal exam Evidence of prior surgery the distal rectum with some staples evident 4 sessile polyps removed. Internal hemorrhoids, grade 3 Exam otherwise normal.  Reports ongoing issues from her hemorrhoids. She has been taking MiraLAX upwards now twice a day. Her stools are generally soft. She did recently travel and had some constipation with it but overall, her symptoms or not much improved. She still notes itching and burning and some discomfort. No significant prolapsing tissue per se. Occasional bright red blood per rectum. A few  weeks ago she had a significant amount and had to wear a pad to keep from soiling her close from bleeding. She denies any incontinence to gas, liquid, or solid stool.  Denies any changes in her health or health hx since we met in the office. States she is ready for surgery.  PMH: GERD, constipation  PSH: Stable hemorrhoidopexy, 2006; breast cancer; cholecystectomy; hysterectomy; C-section x2  FHx: Denies any known family history of colorectal, breast, endometrial or ovarian cancer  Social Hx: Denies use of tobacco/EtOH/illicit drug.   Past Medical History:  Diagnosis Date   Acoustic neuroma ([place])    Adenomatous colon polyp 12/2002   Arthritis    Breast cancer ([place]) 1996   right breast cancer in 1996, left breast cancer in 2019   Cancer ([place])    Cholelithiases    Esophageal stricture    Family history of breast cancer    Family history of ovarian cancer    Family history of pancreatic cancer    Family history of prostate cancer    GERD (gastroesophageal reflux disease)    Hemorrhoids    Hiatal hernia    History of colon polyps    History of radiation therapy 02/07/18-03/04/2018   Left Breast/ 40.05 Gy in 15 fractions, Left Breast boost/ 10 Gy in 5 fractions.    Inguinal hernia    UTI (lower urinary tract infection)     Past Surgical History:  Procedure Laterality Date   accoustic neuroma     BREAST LUMPECTOMY  BREAST LUMPECTOMY WITH RADIOACTIVE SEED AND SENTINEL LYMPH NODE BIOPSY Left 12/15/2017   Procedure: LEFT BREAST LUMPECTOMY WITH RADIOACTIVE SEED AND LEFT SENTINEL LYMPH NODE BIOPSY, LEFT BREAST SEED GUIDED EXCISIONAL BIOPSY;  Surgeon: Rolm Bookbinder, MD;  Location: [facility];  Service: General;  Laterality: Left;   CESAREAN SECTION     2x   CHOLECYSTECTOMY  2008   HEMORRHOID SURGERY  2007   [place]   PARTIAL HYSTERECTOMY  1996   PORT-A-CATH REMOVAL Right 12/15/2017   Procedure: REMOVAL PORT-A-CATH;  Surgeon: Rolm Bookbinder, MD;  Location: [facility];  Service: General;  Laterality: Right;   PORTACATH PLACEMENT Right 08/05/2017   Procedure: INSERTION PORT-A-CATH WITH [place];  Surgeon: Rolm Bookbinder, MD;  Location: [facility];  Service: General;  Laterality: Right;   SHOULDER SURGERY Right 2005   torn muscle   TONSILLECTOMY      Family History  Problem Relation Age of Onset   Heart disease Mother    Melanoma Mother        dx in her 28s   Ovarian cancer Mother 83   Heart disease Father    Diabetes Father    Stroke Father    Ovarian cancer Sister 7   Non-Hodgkin's lymphoma Maternal Grandmother    Heart disease Maternal Grandfather    Heart disease Paternal Grandmother    Prostate cancer Paternal Grandfather    Pancreatic cancer Maternal Aunt    Cancer Maternal Aunt        NOS   Bone cancer Maternal Uncle    Stroke Maternal Uncle    Bone cancer Maternal Uncle    Breast cancer Paternal Aunt    Breast cancer Cousin        daughter of mat aunt with NOS cancer   Colon cancer Neg Hx    Esophageal cancer Neg Hx    Stomach cancer Neg Hx     Social:  reports that she has never smoked. She has never used smokeless tobacco. She reports that she does not drink alcohol and does not use drugs.  Allergies:  Allergies  Allergen Reactions   Amoxicillin Itching, Rash and Nausea Only    Medications: I have reviewed the patient's current medications.  No results found for this or any previous visit (from the past 48 hour(s)).  No results found.  ROS - all of the below systems have been reviewed with the patient and positives are indicated with bold text General: chills, fever or night sweats Eyes: blurry vision or double vision ENT: epistaxis or sore throat Allergy/Immunology: itchy/watery eyes or nasal congestion Hematologic/Lymphatic: bleeding problems, blood clots or swollen lymph nodes Endocrine: temperature intolerance or unexpected weight changes Breast:  new or changing breast lumps or nipple discharge Resp: cough, shortness of breath, or wheezing CV: chest pain or dyspnea on exertion GI: as per HPI GU: dysuria, trouble voiding, or hematuria MSK: joint pain or joint stiffness Neuro: TIA or stroke symptoms Derm: pruritus and skin lesion changes Psych: anxiety and depression  PE Pulse 68, temperature 98 F (36.7 C), temperature source Oral, resp. rate 16, height 5' 3.5" (1.613 m), weight 55 kg. Constitutional: NAD; conversant Eyes: Moist conjunctiva Lungs: Normal respiratory effort CV: RRR MSK: Normal range of motion of extremities Psychiatric: Appropriate affect  No results found for this or any previous visit (from the past 48 hour(s)).  No results found.  A/P: Stephanie Bautista is an 72 y.o. female with hx of prior  stapled hemorrhoidopexy 03/2005 by Dr. Kathrin Penner here for evaluation of recurrent mixed internal/external hemorrhoids-3 column  Cscope updated 05/2022  -The anatomy and physiology of the anal canal was discussed with the patient with associated pictures. The pathophysiology of hemorrhoids was discussed at length with associated pictures and illustrations. -We have reviewed options going forward including further observation vs surgery -hemorrhoidectomy; anorectal exam under anesthesia. -The planned procedure, material risks (including, but not limited to, pain, bleeding, infection, scarring, need for blood transfusion, damage to anal sphincter, incontinence of gas and/or stool, need for additional procedures, anal stenosis, rare cases of pelvic sepsis which in severe cases may require things like a colostomy, recurrence, pneumonia, heart attack, stroke, death) benefits and alternatives to surgery were discussed at length. I noted a good probability that the procedure would help improve their symptoms. The patient's questions were answered to her satisfaction, she voiced understanding and elected to proceed with surgery.  Additionally, we discussed typical postoperative expectations and the recovery process.   Nadeen Landau, [facility], [place]

## 2022-07-03 NOTE — Anesthesia Procedure Notes (Signed)
Procedure Name: Intubation Date/Time: 07/03/2022 7:37 AM  Performed by: Rogers Blocker, CRNAPre-anesthesia Checklist: Patient identified, Emergency Drugs available, Suction available and Patient being monitored Patient Re-evaluated:Patient Re-evaluated prior to induction Oxygen Delivery Method: Circle System Utilized Preoxygenation: Pre-oxygenation with 100% oxygen Induction Type: IV induction Ventilation: Mask ventilation without difficulty Laryngoscope Size: Mac and 3 Grade View: Grade I Tube type: Oral Tube size: 7.0 mm Number of attempts: 1 Airway Equipment and Method: Stylet and Bite block Placement Confirmation: ETT inserted through vocal cords under direct vision, positive ETCO2 and breath sounds checked- equal and bilateral Secured at: 22 cm Tube secured with: Tape Dental Injury: Teeth and Oropharynx as per pre-operative assessment

## 2022-07-03 NOTE — Op Note (Signed)
07/03/2022  8:25 AM  PATIENT:  Stephanie Bautista  72 y.o. female  Patient Care Team: Tisovec, Fransico Him, MD as PCP - General (Internal Medicine) Magrinat, Virgie Dad, MD (Inactive) as Consulting Physician (Oncology) Rolm Bookbinder, MD as Consulting Physician (General Surgery) Maisie Fus, MD (Inactive) as Consulting Physician (Obstetrics and Gynecology) Delrae Rend, MD as Consulting Physician (Endocrinology) Verner Chol, MD as Consulting Physician (Sports Medicine) Lavonia Dana, MD as Referring Physician (Otolaryngology) Eppie Gibson, MD as Attending Physician (Radiation Oncology) Georgina Quint, MD as Referring Physician (Radiology)  PRE-OPERATIVE DIAGNOSIS:  Hemorrhoids  POST-OPERATIVE DIAGNOSIS:  Same  PROCEDURE:   Hemorrhoidectomy right posterior; hemorrhoidectomy left posterior; hemorrhoidectomy left anterior tag Anorectal exam under anesthesia  SURGEON:  Surgeon(s): Ileana Roup, MD  ASSISTANT: OR staff   ANESTHESIA:   local and general  SPECIMEN:   Right posterior hemorrhoidal tissue Left posterior hemorrhoidal tissue Left anterior hemorrhoidal tag  DISPOSITION OF SPECIMEN:  PATHOLOGY  COUNTS:  Sponge, needle, and instrument counts were reported correct x2 at conclusion.  EBL: 5 mL  Drains: None  PLAN OF CARE: Discharge to home after PACU  PATIENT DISPOSITION:  PACU - hemodynamically stable.  OR FINDINGS: Mixed internal/external hemorrhoids in both the right posterior and left posterior positions.  External hemorrhoidal tag in the left anterior position.  All hemorrhoidal tissue was removed.  Anorectal exam demonstrated no other pertinent findings.  DESCRIPTION: The patient was identified in the preoperative holding area and taken to the OR. SCDs were applied. She then underwent general endotracheal anesthesia without difficulty. The patient was then rolled onto the OR table in the prone jackknife position. Pressure points were then  evaluated and padded. Benzoin was applied to the buttocks and they were gently taped apart.  She was then prepped and draped in usual sterile fashion.  A surgical timeout was performed indicating the correct patient, procedure, and positioning.  A perianal block was then created using a dilute mixture of 0.25% Marcaine with epinephrine and Exparel.  After ascertaining an appropriate level of anesthesia had been achieved, a well lubricated digital rectal exam was performed. This demonstrated no palpable masses.  A Hill-Ferguson anoscope was into the anal canal and circumferential inspection demonstrated healthy appearing anoderm without any ulcerations or fissures.  There are no intra-anal lesions are identified.  She does have mixed internal and external hemorrhoids in both the right posterior and left posterior positions.  Anteriorly there is a decompressed hemorrhoidal tag.    Attention was first directed the largest hemorrhoidal component which is in the right posterior position.  With an anoscope in place, this is elevated with a DeBakey forcep.  Borders of excision are marked including both the internal and external component.  The skin is then incised sharply and the hemorrhoidal tissue was removed with combination of sharp dissection and electrocautery teasing away from sphincter muscle.  No muscle was divided with this approach.  All hemorrhoidal tissue at this point is removed and passed off the specimen.  Hemorrhoidal varicosities were then fulgurated using electrocautery.  Hemostasis is verified.  The hemorrhoidal defect was then closed using a 2-0 Vicryl figure-of-eight stitch at the apex followed by running 2-0 Vicryl and transitioning to a 3-0 Vicryl suture.  The anal canal was irrigated and hemostasis is verified.  Attention is then directed at the left posterior hemorrhoidal component. With an anoscope in place, this is elevated with a DeBakey forcep.  Borders of excision are marked including  both the internal and external  component. The skin is then incised sharply and the hemorrhoidal tissue was removed with combination of sharp dissection and electrocautery teasing away from sphincter muscle.  No muscle was divided with this approach.  The skin is then incised sharply and the hemorrhoidal tissue was removed with combination of sharp dissection and electrocautery.  All hemorrhoidal tissue at this point is removed and passed off the specimen.  Hemorrhoidal varicosities were then fulgurated using electrocautery.  Hemostasis is verified.  The hemorrhoidal defect was then closed using a 2-0 Vicryl figure-of-eight stitch at the apex followed by running 2-0 Vicryl and transitioning to a 3-0 Vicryl suture.  The anal canal was irrigated and hemostasis is verified.  Attention is then directed at the hemorrhoidal tag in the left anterior position.  This is elevated with an Adson forcep.  This is then excised sharply after dissecting free from the underlying external sphincter muscle passed off the specimen.  Hemostasis is achieved electrocautery.  Skin 3-0 chromic suture.   The anal canal was then reinspected and irrigated.  Hemostasis is verified.  All sponge, needle, and instrument counts are reported correct.  Additional local anesthetic is infiltrated surgical site with topical dedicated applied.  A dressing consisting of 4 x 4's, ABD, and mesh underwear is placed.  DISPOSITION: PACU in satisfactory condition.

## 2022-07-06 ENCOUNTER — Encounter (HOSPITAL_BASED_OUTPATIENT_CLINIC_OR_DEPARTMENT_OTHER): Payer: Self-pay | Admitting: Surgery

## 2022-07-06 LAB — SURGICAL PATHOLOGY

## 2022-09-01 DIAGNOSIS — Z1231 Encounter for screening mammogram for malignant neoplasm of breast: Secondary | ICD-10-CM | POA: Diagnosis not present

## 2022-09-01 DIAGNOSIS — M810 Age-related osteoporosis without current pathological fracture: Secondary | ICD-10-CM | POA: Diagnosis not present

## 2022-09-01 DIAGNOSIS — Z780 Asymptomatic menopausal state: Secondary | ICD-10-CM | POA: Diagnosis not present

## 2022-09-03 DIAGNOSIS — D333 Benign neoplasm of cranial nerves: Secondary | ICD-10-CM | POA: Diagnosis not present

## 2022-09-03 DIAGNOSIS — H903 Sensorineural hearing loss, bilateral: Secondary | ICD-10-CM | POA: Diagnosis not present

## 2022-09-03 DIAGNOSIS — Z483 Aftercare following surgery for neoplasm: Secondary | ICD-10-CM | POA: Diagnosis not present

## 2022-09-03 DIAGNOSIS — L659 Nonscarring hair loss, unspecified: Secondary | ICD-10-CM | POA: Diagnosis not present

## 2022-09-03 DIAGNOSIS — Z923 Personal history of irradiation: Secondary | ICD-10-CM | POA: Diagnosis not present

## 2022-09-03 DIAGNOSIS — Z01118 Encounter for examination of ears and hearing with other abnormal findings: Secondary | ICD-10-CM | POA: Diagnosis not present

## 2022-09-03 DIAGNOSIS — M899 Disorder of bone, unspecified: Secondary | ICD-10-CM | POA: Diagnosis not present

## 2022-09-03 DIAGNOSIS — R42 Dizziness and giddiness: Secondary | ICD-10-CM | POA: Diagnosis not present

## 2022-09-03 DIAGNOSIS — R413 Other amnesia: Secondary | ICD-10-CM | POA: Diagnosis not present

## 2022-09-03 DIAGNOSIS — E236 Other disorders of pituitary gland: Secondary | ICD-10-CM | POA: Diagnosis not present

## 2022-09-03 DIAGNOSIS — R2689 Other abnormalities of gait and mobility: Secondary | ICD-10-CM | POA: Diagnosis not present

## 2022-09-03 DIAGNOSIS — R29898 Other symptoms and signs involving the musculoskeletal system: Secondary | ICD-10-CM | POA: Diagnosis not present

## 2022-10-02 ENCOUNTER — Encounter (HOSPITAL_COMMUNITY)
Admission: RE | Admit: 2022-10-02 | Discharge: 2022-10-02 | Disposition: A | Discharge status: Home / Self Care | Payer: Medicare Other | Source: Ambulatory Visit | Attending: Hematology and Oncology | Admitting: Hematology and Oncology

## 2022-10-02 DIAGNOSIS — C50919 Malignant neoplasm of unspecified site of unspecified female breast: Secondary | ICD-10-CM | POA: Diagnosis not present

## 2022-10-02 DIAGNOSIS — C50412 Malignant neoplasm of upper-outer quadrant of left female breast: Secondary | ICD-10-CM | POA: Diagnosis not present

## 2022-10-02 DIAGNOSIS — Z171 Estrogen receptor negative status [ER-]: Secondary | ICD-10-CM | POA: Insufficient documentation

## 2022-10-02 DIAGNOSIS — C7951 Secondary malignant neoplasm of bone: Secondary | ICD-10-CM | POA: Diagnosis not present

## 2022-10-02 MED ORDER — TECHNETIUM TC 99M MEDRONATE IV KIT
20.0000 | PACK | Freq: Once | INTRAVENOUS | Status: AC | PRN
  Administered 2022-10-02: 22 via INTRAVENOUS

## 2022-10-05 NOTE — Progress Notes (Signed)
Patient Care Team: Tisovec, Adelfa Koh, MD as PCP - General (Internal Medicine) Magrinat, Valentino Hue, MD (Inactive) as Consulting Physician (Oncology) Emelia Loron, MD as Consulting Physician (General Surgery) Freddy Finner, MD (Inactive) as Consulting Physician (Obstetrics and Gynecology) Talmage Coin, MD as Consulting Physician (Endocrinology) Melina Fiddler, MD as Consulting Physician (Sports Medicine) Nigel Sloop, MD as Referring Physician (Otolaryngology) Lonie Peak, MD as Attending Physician (Radiation Oncology) Shann Medal, MD as Referring Physician (Radiology)  DIAGNOSIS: No diagnosis found.  SUMMARY OF ONCOLOGIC HISTORY: Oncology History  Malignant neoplasm of upper-outer quadrant of left breast in female, estrogen receptor negative (HCC)  09/02/1994 Surgery   status post right lumpectomy 09/02/1994 for ductal carcinoma in situ             (a) status post adjuvant radiation 09/21/1994 through 11/04/1994, total 5040 centigrade to the breast and 604 0 cGy to the tumor bed             (b) did not receive adjuvant antiestrogens   07/22/2017 Initial Biopsy    status post left breast upper outer quadrant biopsy for a clinical T1c pN0, stage IB invasive ductal carcinoma, grade 3, triple negative, with an MIB-1 of 50%.             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  The Multi-Gene Panel offered by [facility] includes sequencing and/or deletion duplication testing of the following 83 genes: ALK, APC, ATM, AXIN2,BAP1,  BARD1, BLM, BMPR1A, BRCA1, BRCA2, BRIP1, CASR, CDC73, CDH1,  CDK4, CDKN1B, CDKN1C, CDKN2A (p14ARF), CDKN2A (p16INK4a), CEBPA, CHEK2, CTNNA1, DICER1, DIS3L2, EGFR (c.2369C>T, p.Thr790Met variant only), EPCAM (Deletion/duplication testing only), FH, FLCN, GATA2, GPC3, GREM1 (Promoter region deletion/duplication testing only), HOXB13 (c.251G>A, p.Gly84Glu), HRAS, KIT, MAX, MEN1, MET, MITF (c.952G>A, p.Glu318Lys variant only), MLH1, MSH2, MSH3, MSH6, MUTYH, NBN, NF1, NF2, NTHL1, PALB2, PDGFRA, PHOX2B, PMS2, POLD1, POLE, POT1, PRKAR1A, PTCH1, PTEN, RAD50, RAD51C, RAD51D, RB1, RECQL4, RET, RUNX1, SDHAF2, SDHA (sequence changes only), SDHB, SDHC, SDHD, SMAD4, SMARCA4, SMARCB1, SMARCE1, STK11, SUFU, TERT, TERT, TMEM127, TP53, TSC1, TSC2, VHL, WRN and WT1.  The report date is August 02, 2017.    08/24/2017 - 11/23/2017 Neo-Adjuvant Chemotherapy    chemotherapy consisting of doxorubicin/ cyclophosphamide in dose dense fashion x4 started 08/24/2017, completed 10/12/2017,  followed by paclitaxel/ carboplatin weekly              (a) carboplatin/paclitaxel discontinued after 3 doses, stopped 11/23/2017, due to cytopenias             (b) post chemo MRI 12/04/2017 findings a complete imaging response   12/15/2017 Surgery   status post left lumpectomy and sentinel lymph node sampling showing a complete pathologic response (ypT0, ypN0)   02/07/2018 - 03/04/2018 Radiation Therapy   adjuvant radiation with Dr. Basilio Cairo: Left breast 40.05 Gy in 15 fractions, boost: 10 Gy in 5 fractions      CHIEF COMPLIANT: T: Follow-up triple negative breast cancer  INTERVAL HISTORY: Stephanie Bautista is a 72 y.o. with the above mention Triple negative breast cancer. She presents to the clinic today for a follow-up.   ALLERGIES:  is allergic to amoxicillin.  MEDICATIONS:  Current Outpatient Medications  Medication Sig Dispense Refill   ascorbic acid (VITAMIN C) 500 MG tablet Take 500 mg by mouth daily.     BIOTIN 5000 5 MG CAPS Take by mouth.     Inulin-Calcium-Vitamin D 2-250-100 GM-MG-UNIT  CHEW Chew by mouth daily.     Multiple Vitamin (MULTI-VITAMIN) tablet Take 1 tablet by mouth daily.     Polyethylene Glycol 3350 (MIRALAX PO) Take by mouth.     No current facility-administered medications for this visit.    PHYSICAL EXAMINATION: ECOG PERFORMANCE STATUS: {CHL ONC ECOG [phone]}  There were no vitals filed for this visit. There were no vitals filed for this visit.  BREAST:*** No palpable masses or nodules in either right or left breasts. No palpable axillary supraclavicular or infraclavicular adenopathy no breast tenderness or nipple discharge. (exam performed in the presence of a chaperone)  LABORATORY DATA:  I have reviewed the data as listed    Latest Ref Rng & Units 09/24/2021   10:54 AM 09/25/2020    2:37 PM 08/14/2019    9:18 AM  CMP  Glucose 70 - 99 mg/dL 89  098  67   BUN 8 - 23 mg/dL 12  17  13    Creatinine 0.44 - 1.00 mg/dL 1.19  1.47  8.29   Sodium 135 - 145 mmol/L 139  145  140   Potassium 3.5 - 5.1 mmol/L 4.2  3.9  3.7   Chloride 98 - 111 mmol/L 106  110  107   CO2 22 - 32 mmol/L 27  27  25    Calcium 8.9 - 10.3 mg/dL 9.2  9.5  9.1   Total Protein 6.5 - 8.1 g/dL 6.7  7.3  6.7   Total Bilirubin 0.3 - 1.2 mg/dL 0.7  0.2  0.6   Alkaline Phos 38 - 126 U/L 51  56  76   AST 15 - 41 U/L 18  19  13    ALT 0 - 44 U/L 11  11  8      Lab Results  Component Value Date   WBC 4.3 09/24/2021   HGB 12.6 09/24/2021   HCT 39.1 09/24/2021   MCV 87.3 09/24/2021   PLT 173 09/24/2021   NEUTROABS 2.4 09/24/2021    ASSESSMENT & PLAN:  No problem-specific Assessment & Plan notes found for this encounter.    No orders of the defined types were placed in this encounter.  The patient has a good understanding of the overall plan. she agrees with it. she will call with any problems that may develop before the next visit here. Total time spent: 30 mins including face to face time and time spent for planning, charting and co-ordination of care   Sherlyn Lick,  CMA 10/05/22    I Janan Ridge am acting as a Neurosurgeon for [employer]  ***

## 2022-10-06 ENCOUNTER — Inpatient Hospital Stay: Payer: Medicare Other | Attending: Hematology and Oncology | Admitting: Hematology and Oncology

## 2022-10-06 VITALS — BP 172/63 | HR 84 | Temp 97.8°F | Resp 18 | Ht 63.5 in | Wt 124.0 lb

## 2022-10-06 DIAGNOSIS — Z9221 Personal history of antineoplastic chemotherapy: Secondary | ICD-10-CM | POA: Diagnosis not present

## 2022-10-06 DIAGNOSIS — Z86000 Personal history of in-situ neoplasm of breast: Secondary | ICD-10-CM | POA: Diagnosis not present

## 2022-10-06 DIAGNOSIS — C7951 Secondary malignant neoplasm of bone: Secondary | ICD-10-CM | POA: Insufficient documentation

## 2022-10-06 DIAGNOSIS — Z171 Estrogen receptor negative status [ER-]: Secondary | ICD-10-CM | POA: Insufficient documentation

## 2022-10-06 DIAGNOSIS — C50412 Malignant neoplasm of upper-outer quadrant of left female breast: Secondary | ICD-10-CM | POA: Diagnosis not present

## 2022-10-06 DIAGNOSIS — Z923 Personal history of irradiation: Secondary | ICD-10-CM | POA: Insufficient documentation

## 2022-10-06 NOTE — Assessment & Plan Note (Signed)
09/02/1994: Rt Lumpectomy: DCIS 09/21/1994-11/04/1994: XRT (did not receive Anti estrogens)   07/22/2017: T1c N0 Satge 1B: Grade 3 IDC TNBC Ki 67: 50% 08/02/2017: Genetics Neg 08/24/17-11/23/2017:Neo Adj chemo DD AC x 4, Taxol-carbo X 3 (stopped for cytopenias) 12/15/2017: Left Lumpectomy: Path CR 02/07/2018-03/04/2018: XRT   Breast Cancer Surveillance: 1. Breast exam 03/31/2022: Normal 2. Mammogram 08/26/21 No abnormalities. Postsurgical changes. Breast Density Category B. 3.  Bone scan 10/02/2022: Bone mets right calvarium, resolution of uptake in the lumbar spine and anterior fifth rib   Continue watchful monitoring RTC in 1 year for follow-up

## 2022-10-07 ENCOUNTER — Telehealth: Payer: Self-pay | Admitting: Hematology and Oncology

## 2022-10-07 NOTE — Telephone Encounter (Signed)
Scheduled appointment per 5/14 los. Patient is aware of the made appointment.

## 2023-02-08 ENCOUNTER — Emergency Department (HOSPITAL_BASED_OUTPATIENT_CLINIC_OR_DEPARTMENT_OTHER): Payer: Medicare Other

## 2023-02-08 ENCOUNTER — Other Ambulatory Visit: Payer: Self-pay

## 2023-02-08 ENCOUNTER — Emergency Department (HOSPITAL_BASED_OUTPATIENT_CLINIC_OR_DEPARTMENT_OTHER)
Admission: EM | Admit: 2023-02-08 | Discharge: 2023-02-08 | Disposition: A | Discharge status: Home / Self Care | Payer: Medicare Other | Attending: Emergency Medicine | Admitting: Emergency Medicine

## 2023-02-08 ENCOUNTER — Encounter (HOSPITAL_BASED_OUTPATIENT_CLINIC_OR_DEPARTMENT_OTHER): Payer: Self-pay | Admitting: Urology

## 2023-02-08 DIAGNOSIS — S52352A Displaced comminuted fracture of shaft of radius, left arm, initial encounter for closed fracture: Secondary | ICD-10-CM | POA: Diagnosis not present

## 2023-02-08 DIAGNOSIS — Y9373 Activity, racquet and hand sports: Secondary | ICD-10-CM | POA: Insufficient documentation

## 2023-02-08 DIAGNOSIS — S52532A Colles' fracture of left radius, initial encounter for closed fracture: Secondary | ICD-10-CM | POA: Diagnosis not present

## 2023-02-08 DIAGNOSIS — S52572A Other intraarticular fracture of lower end of left radius, initial encounter for closed fracture: Secondary | ICD-10-CM | POA: Diagnosis not present

## 2023-02-08 DIAGNOSIS — W1839XA Other fall on same level, initial encounter: Secondary | ICD-10-CM | POA: Insufficient documentation

## 2023-02-08 DIAGNOSIS — M25532 Pain in left wrist: Secondary | ICD-10-CM | POA: Diagnosis not present

## 2023-02-08 DIAGNOSIS — S52502D Unspecified fracture of the lower end of left radius, subsequent encounter for closed fracture with routine healing: Secondary | ICD-10-CM | POA: Diagnosis not present

## 2023-02-08 DIAGNOSIS — S6992XA Unspecified injury of left wrist, hand and finger(s), initial encounter: Secondary | ICD-10-CM | POA: Diagnosis present

## 2023-02-08 DIAGNOSIS — S6292XD Unspecified fracture of left wrist and hand, subsequent encounter for fracture with routine healing: Secondary | ICD-10-CM | POA: Diagnosis not present

## 2023-02-08 DIAGNOSIS — Z853 Personal history of malignant neoplasm of breast: Secondary | ICD-10-CM | POA: Insufficient documentation

## 2023-02-08 DIAGNOSIS — S52612A Displaced fracture of left ulna styloid process, initial encounter for closed fracture: Secondary | ICD-10-CM | POA: Diagnosis not present

## 2023-02-08 DIAGNOSIS — S52612D Displaced fracture of left ulna styloid process, subsequent encounter for closed fracture with routine healing: Secondary | ICD-10-CM | POA: Diagnosis not present

## 2023-02-08 MED ORDER — LIDOCAINE HCL (PF) 1 % IJ SOLN
30.0000 mL | Freq: Once | INTRAMUSCULAR | Status: AC
  Administered 2023-02-08: 30 mL
  Filled 2023-02-08: qty 30

## 2023-02-08 MED ORDER — HYDROCODONE-ACETAMINOPHEN 5-325 MG PO TABS: 1.0000 | ORAL_TABLET | Freq: Four times a day (QID) | ORAL | 0 refills | Status: DC | PRN

## 2023-02-08 MED ORDER — MORPHINE SULFATE (PF) 4 MG/ML IV SOLN
4.0000 mg | Freq: Once | INTRAVENOUS | Status: AC
  Administered 2023-02-08: 4 mg via INTRAMUSCULAR
  Filled 2023-02-08: qty 1

## 2023-02-08 MED ORDER — SENNOSIDES-DOCUSATE SODIUM 8.6-50 MG PO TABS: 1.0000 | ORAL_TABLET | Freq: Every day | ORAL | 0 refills | Status: DC

## 2023-02-08 NOTE — ED Notes (Signed)
ED Provider at bedside. 

## 2023-02-08 NOTE — ED Triage Notes (Addendum)
Fall today playing pickle ball, left wrist injury, OBVIOUS Deformity Swelling and pain, no circulation impairment, good radial pulse Supported with sling while waiting

## 2023-02-08 NOTE — Discharge Instructions (Addendum)
You were seen in the emergency department for your wrist pain after your fall.  You did break both of the bones in your wrist.  We were able to straighten out the bones and placed you in a wrist splint.  You should keep this on at all times until you are further instructed by orthopedics.  You can continue to ice your wrist and keep it elevated to help with the swelling.  You can take Tylenol and Motrin every 6 hours as needed for pain and I have given you Vicodin as needed for breakthrough pain.  This does contain Tylenol as well so make sure you do not exceed 4000 mg in 24 hours.  I have also given you a stool softener to take with this as it can make you constipated.  It can also make you drowsy so do not take it before driving, working or operating heavy machinery.  You should follow-up with your orthopedic doctor on Wednesday as planned.  You should return to the emergency department if you have significantly worsening pain, numbness in your hand or fingers turn blue or if you have any other new or concerning symptoms.

## 2023-02-08 NOTE — ED Notes (Signed)
Pt is comfortably finger trapped starting 1445. Will splint after EDP sees progress

## 2023-02-08 NOTE — ED Provider Notes (Signed)
EMERGENCY DEPARTMENT AT [facility] Provider Note   CSN: [id number] Arrival date & time: 02/08/23  1203     History  Chief Complaint  Patient presents with   Wrist Injury    Abrianna B Bosch is a 72 y.o. female with a past medical history of breast cancer and osteoporosis who presents to the emergency department for left wrist injury after she fell while playing pickle ball this afternoon. Patient reports 8/10 left wrist pain that improved with morphine.   Patient only takes vitamin D for osteoporosis.      Home Medications Prior to Admission medications   Medication Sig Start Date End Date Taking? Authorizing Provider  ascorbic acid (VITAMIN C) 500 MG tablet Take 500 mg by mouth daily.    [provider]  BIOTIN 5000 5 MG CAPS Take by mouth.    [provider]  Inulin-Calcium-Vitamin D 2-250-100 GM-MG-UNIT CHEW Chew by mouth daily.    [provider]  Multiple Vitamin (MULTI-VITAMIN) tablet Take 1 tablet by mouth daily.    [provider]  Polyethylene Glycol 3350 (MIRALAX PO) Take by mouth.    [provider]      Allergies    Amoxicillin    Review of Systems   Review of Systems  Physical Exam Updated Vital Signs BP (!) 186/88   Pulse 64   Temp 97.8 F (36.6 C)   Resp 16   Ht 5\' 4"  (1.626 m)   Wt 56.2 kg   SpO2 100%   BMI 21.27 kg/m  Physical Exam Vitals reviewed.  Cardiovascular:     Rate and Rhythm: Normal rate and regular rhythm.     Heart sounds: Normal heart sounds.  Pulmonary:     Effort: Pulmonary effort is normal.     Breath sounds: Normal breath sounds.  Musculoskeletal:        General: Swelling (Left wrist swelling), tenderness (Left wrist tender to palpation) and deformity (Left wrist deforomity) present.     Left wrist: Swelling, deformity and tenderness present. No lacerations.     Right lower leg: No edema.     Left lower leg: No edema.     Comments: Normal sensation present  on left hand  Neurological:     Mental Status: She is alert.     ED Results / Procedures / Treatments   Labs (all labs ordered are listed, but only abnormal results are displayed) Labs Reviewed - No data to display  EKG None  Radiology DG Wrist Complete Left  Result Date: 02/08/2023 CLINICAL DATA:  Obvious deformity. Fall today playing pickleball. Left wrist injury. Swelling and pain. EXAM: LEFT WRIST - COMPLETE 3+ VIEW COMPARISON:  None available FINDINGS: There is diffuse decreased bone mineralization. There is a comminuted, predominantly transverse fracture of the distal radial metaphysis through the articular surface with up to 9 mm lateral and 10 mm volar displacement of the distal fracture components with respect to the proximal fracture component. There is fracture line extension to the distal articular surface. Moderate impaction. The proximal carpal row articulates with the distal radial fracture component. Tiny mildly displaced ulnar styloid fracture. Mild-to-moderate thumb carpometacarpal osteoarthritis. Moderate wrist soft tissue swelling. IMPRESSION: 1. Comminuted, displaced and impacted intra-articular fracture of the distal radius. 2. Tiny mildly displaced ulnar styloid fracture. Electronically Signed   By: Neita Garnet M.D.   On: 02/08/2023 14:10    Procedures Procedures    Medications Ordered in ED Medications  morphine (PF) 4 MG/ML  injection 4 mg (has no administration in time range)  morphine (PF) 4 MG/ML injection 4 mg (4 mg Intramuscular Given 02/08/23 1219)  lidocaine (PF) (XYLOCAINE) 1 % injection 30 mL (30 mLs Infiltration Given 02/08/23 1353)    ED Course/ Medical Decision Making/ A&P                                 Medical Decision Making Patient is a 72 year old female with a past medical history of osteoporosis who presents to the emergency department for evaluation of left wrist deformity after a fall during pickleball.  Physical exam is remarkable for  left wrist deformity, edema, and tenderness to palpation.  Left wrist x-ray is remarkable for comminuted, displaced and impacted intra-articular fracture of the distal radius and tiny mildly displaced ulnar styloid fracture.   Left hematoma block with 10cc of lidocaine was administered followed by a finger splint. Please see attendings attestation for further treatment plan.   Patient was instructed to follow up with orthopedics within the next week for further treatment of fracture.    Amount and/or Complexity of Data Reviewed Radiology: ordered and independent interpretation performed.    Details: Agree with findings of radiologist   Risk Prescription drug management.           Final Clinical Impression(s) / ED Diagnoses Final diagnoses:  None    Rx / DC Orders ED Discharge Orders     None         Faith Rogue, DO 02/08/23 1503    Elayne Snare K, DO 02/08/23 1522

## 2023-02-08 NOTE — ED Provider Notes (Signed)
Handoff received from prior provider.  Patient with mechanical fall and comminuted, displaced left distal radius and ulnar fracture.  Reportedly neurovascular intact.  She is currently in finger traps after hematoma block.  Plan for reassessment and likely discharge with orthopedics follow-up.  Reassessment she is significant improvement and realignment.  I did perform some manipulation at bedside as well although was not able to completely realign.  She remains neurovascular intact with brisk capillary refill, 2+ radial pulse and normal strength and sensation.  Repeat x-ray shows significant improvement.  She was placed in a sugar-tong splint, remains neurovascularly intact.  She already has follow-up with hand surgery on Wednesday.  Strict return precautions given.   Laurence Spates, MD 02/08/23 [phone]

## 2023-02-08 NOTE — ED Notes (Signed)
Patient transported to X-ray 

## 2023-02-10 DIAGNOSIS — S52502A Unspecified fracture of the lower end of left radius, initial encounter for closed fracture: Secondary | ICD-10-CM | POA: Diagnosis not present

## 2023-02-10 DIAGNOSIS — D333 Benign neoplasm of cranial nerves: Secondary | ICD-10-CM | POA: Diagnosis not present

## 2023-02-10 DIAGNOSIS — S52602A Unspecified fracture of lower end of left ulna, initial encounter for closed fracture: Secondary | ICD-10-CM | POA: Diagnosis not present

## 2023-02-10 DIAGNOSIS — Z853 Personal history of malignant neoplasm of breast: Secondary | ICD-10-CM | POA: Diagnosis not present

## 2023-02-12 DIAGNOSIS — Y999 Unspecified external cause status: Secondary | ICD-10-CM | POA: Diagnosis not present

## 2023-02-12 DIAGNOSIS — X58XXXA Exposure to other specified factors, initial encounter: Secondary | ICD-10-CM | POA: Diagnosis not present

## 2023-02-12 DIAGNOSIS — G8918 Other acute postprocedural pain: Secondary | ICD-10-CM | POA: Diagnosis not present

## 2023-02-12 DIAGNOSIS — S52572A Other intraarticular fracture of lower end of left radius, initial encounter for closed fracture: Secondary | ICD-10-CM | POA: Diagnosis not present

## 2023-02-17 DIAGNOSIS — Z4789 Encounter for other orthopedic aftercare: Secondary | ICD-10-CM | POA: Diagnosis not present

## 2023-02-25 DIAGNOSIS — Z4789 Encounter for other orthopedic aftercare: Secondary | ICD-10-CM | POA: Diagnosis not present

## 2023-03-10 DIAGNOSIS — M25532 Pain in left wrist: Secondary | ICD-10-CM | POA: Diagnosis not present

## 2023-03-10 DIAGNOSIS — S52602A Unspecified fracture of lower end of left ulna, initial encounter for closed fracture: Secondary | ICD-10-CM | POA: Diagnosis not present

## 2023-03-10 DIAGNOSIS — Z4789 Encounter for other orthopedic aftercare: Secondary | ICD-10-CM | POA: Diagnosis not present

## 2023-03-10 DIAGNOSIS — S52502A Unspecified fracture of the lower end of left radius, initial encounter for closed fracture: Secondary | ICD-10-CM | POA: Diagnosis not present

## 2023-03-17 DIAGNOSIS — M25632 Stiffness of left wrist, not elsewhere classified: Secondary | ICD-10-CM | POA: Diagnosis not present

## 2023-03-23 DIAGNOSIS — M25632 Stiffness of left wrist, not elsewhere classified: Secondary | ICD-10-CM | POA: Diagnosis not present

## 2023-03-23 DIAGNOSIS — M25642 Stiffness of left hand, not elsewhere classified: Secondary | ICD-10-CM | POA: Diagnosis not present

## 2023-03-24 DIAGNOSIS — Z4789 Encounter for other orthopedic aftercare: Secondary | ICD-10-CM | POA: Diagnosis not present

## 2023-03-24 DIAGNOSIS — M25642 Stiffness of left hand, not elsewhere classified: Secondary | ICD-10-CM | POA: Diagnosis not present

## 2023-03-24 DIAGNOSIS — M25632 Stiffness of left wrist, not elsewhere classified: Secondary | ICD-10-CM | POA: Diagnosis not present

## 2023-03-30 DIAGNOSIS — M25642 Stiffness of left hand, not elsewhere classified: Secondary | ICD-10-CM | POA: Diagnosis not present

## 2023-03-30 DIAGNOSIS — M25632 Stiffness of left wrist, not elsewhere classified: Secondary | ICD-10-CM | POA: Diagnosis not present

## 2023-04-01 DIAGNOSIS — M25642 Stiffness of left hand, not elsewhere classified: Secondary | ICD-10-CM | POA: Diagnosis not present

## 2023-04-01 DIAGNOSIS — M25632 Stiffness of left wrist, not elsewhere classified: Secondary | ICD-10-CM | POA: Diagnosis not present

## 2023-04-05 DIAGNOSIS — M25632 Stiffness of left wrist, not elsewhere classified: Secondary | ICD-10-CM | POA: Diagnosis not present

## 2023-04-08 DIAGNOSIS — M25632 Stiffness of left wrist, not elsewhere classified: Secondary | ICD-10-CM | POA: Diagnosis not present

## 2023-04-09 ENCOUNTER — Ambulatory Visit (HOSPITAL_COMMUNITY)
Admission: RE | Admit: 2023-04-09 | Discharge: 2023-04-09 | Disposition: A | Discharge status: Home / Self Care | Payer: Medicare Other | Source: Ambulatory Visit | Attending: Hematology and Oncology

## 2023-04-09 ENCOUNTER — Ambulatory Visit (HOSPITAL_COMMUNITY)
Admission: RE | Admit: 2023-04-09 | Discharge: 2023-04-09 | Disposition: A | Discharge status: Home / Self Care | Payer: Medicare Other | Source: Ambulatory Visit | Attending: Hematology and Oncology | Admitting: Hematology and Oncology

## 2023-04-09 DIAGNOSIS — C50412 Malignant neoplasm of upper-outer quadrant of left female breast: Secondary | ICD-10-CM | POA: Insufficient documentation

## 2023-04-09 DIAGNOSIS — Z9221 Personal history of antineoplastic chemotherapy: Secondary | ICD-10-CM | POA: Diagnosis not present

## 2023-04-09 DIAGNOSIS — Z171 Estrogen receptor negative status [ER-]: Secondary | ICD-10-CM | POA: Insufficient documentation

## 2023-04-09 DIAGNOSIS — Z923 Personal history of irradiation: Secondary | ICD-10-CM | POA: Diagnosis not present

## 2023-04-09 DIAGNOSIS — C50919 Malignant neoplasm of unspecified site of unspecified female breast: Secondary | ICD-10-CM | POA: Diagnosis not present

## 2023-04-09 MED ORDER — TECHNETIUM TC 99M MEDRONATE IV KIT
18.6000 | PACK | Freq: Once | INTRAVENOUS | Status: AC
  Administered 2023-04-09: 18.6 via INTRAVENOUS

## 2023-04-12 DIAGNOSIS — M25642 Stiffness of left hand, not elsewhere classified: Secondary | ICD-10-CM | POA: Diagnosis not present

## 2023-04-12 NOTE — Assessment & Plan Note (Signed)
09/02/1994: Rt Lumpectomy: DCIS 09/21/1994-11/04/1994: XRT (did not receive Anti estrogens)   07/22/2017: T1c N0 Satge 1B: Grade 3 IDC TNBC Ki 67: 50% 08/02/2017: Genetics Neg 08/24/17-11/23/2017:Neo Adj chemo DD AC x 4, Taxol-carbo X 3 (stopped for cytopenias) 12/15/2017: Left Lumpectomy: Path CR 02/07/2018-03/04/2018: XRT   Breast Cancer Surveillance: 1. Breast exam 04/13/2023: Normal 2. Mammogram 09/01/2022 no abnormalities. Postsurgical changes. Breast Density Category B. 3.  Bone scan 10/02/2022: Bone mets right calvarium, resolution of uptake in the lumbar spine and anterior fifth rib 4.  Bone scan 04/13/2023: Increased uptake in the distal left radius consistent with known subacute fracture.  2 foci in the right calvarium no other suspicious findings.   Continue watchful monitoring Recheck with another bone scan 6 months and follow-up after that.

## 2023-04-13 ENCOUNTER — Inpatient Hospital Stay: Payer: Medicare Other

## 2023-04-13 ENCOUNTER — Inpatient Hospital Stay: Payer: Medicare Other | Attending: Hematology and Oncology | Admitting: Hematology and Oncology

## 2023-04-13 VITALS — BP 180/78 | HR 68 | Temp 97.8°F | Resp 18 | Ht 64.0 in | Wt 126.7 lb

## 2023-04-13 DIAGNOSIS — Z171 Estrogen receptor negative status [ER-]: Secondary | ICD-10-CM

## 2023-04-13 DIAGNOSIS — C50412 Malignant neoplasm of upper-outer quadrant of left female breast: Secondary | ICD-10-CM

## 2023-04-13 DIAGNOSIS — Z9221 Personal history of antineoplastic chemotherapy: Secondary | ICD-10-CM | POA: Insufficient documentation

## 2023-04-13 DIAGNOSIS — C7951 Secondary malignant neoplasm of bone: Secondary | ICD-10-CM | POA: Diagnosis not present

## 2023-04-13 DIAGNOSIS — Z923 Personal history of irradiation: Secondary | ICD-10-CM | POA: Insufficient documentation

## 2023-04-13 LAB — CBC WITH DIFFERENTIAL (CANCER CENTER ONLY)
Abs Immature Granulocytes: 0.01 10*3/uL (ref 0.00–0.07)
Basophils Absolute: 0 10*3/uL (ref 0.0–0.1)
Basophils Relative: 1 %
Eosinophils Absolute: 0.1 10*3/uL (ref 0.0–0.5)
Eosinophils Relative: 1 %
HCT: 41.9 % (ref 36.0–46.0)
Hemoglobin: 13.7 g/dL (ref 12.0–15.0)
Immature Granulocytes: 0 %
Lymphocytes Relative: 30 %
Lymphs Abs: 1.6 10*3/uL (ref 0.7–4.0)
MCH: 29 pg (ref 26.0–34.0)
MCHC: 32.7 g/dL (ref 30.0–36.0)
MCV: 88.8 fL (ref 80.0–100.0)
Monocytes Absolute: 0.5 10*3/uL (ref 0.1–1.0)
Monocytes Relative: 9 %
Neutro Abs: 3.3 10*3/uL (ref 1.7–7.7)
Neutrophils Relative %: 59 %
Platelet Count: 194 10*3/uL (ref 150–400)
RBC: 4.72 MIL/uL (ref 3.87–5.11)
RDW: 12.7 % (ref 11.5–15.5)
WBC Count: 5.5 10*3/uL (ref 4.0–10.5)
nRBC: 0 % (ref 0.0–0.2)

## 2023-04-13 LAB — CMP (CANCER CENTER ONLY)
ALT: 9 U/L (ref 0–44)
AST: 17 U/L (ref 15–41)
Albumin: 4.3 g/dL (ref 3.5–5.0)
Alkaline Phosphatase: 61 U/L (ref 38–126)
Anion gap: 6 (ref 5–15)
BUN: 12 mg/dL (ref 8–23)
CO2: 29 mmol/L (ref 22–32)
Calcium: 9.7 mg/dL (ref 8.9–10.3)
Chloride: 106 mmol/L (ref 98–111)
Creatinine: 0.79 mg/dL (ref 0.44–1.00)
GFR, Estimated: >60 mL/min (ref >60)
Glucose, Bld: 88 mg/dL (ref 70–99)
Potassium: 3.7 mmol/L (ref 3.5–5.1)
Sodium: 141 mmol/L (ref 135–145)
Total Bilirubin: 0.6 mg/dL (ref <1.2)
Total Protein: 7.2 g/dL (ref 6.5–8.1)

## 2023-04-13 NOTE — Progress Notes (Signed)
Patient Care Team: Tisovec, Adelfa Koh, MD as PCP - General (Internal Medicine) Magrinat, Valentino Hue, MD (Inactive) as Consulting Physician (Oncology) Emelia Loron, MD as Consulting Physician (General Surgery) Freddy Finner, MD (Inactive) as Consulting Physician (Obstetrics and Gynecology) Talmage Coin, MD as Consulting Physician (Endocrinology) Melina Fiddler, MD as Consulting Physician (Sports Medicine) Nigel Sloop, MD as Referring Physician (Otolaryngology) Lonie Peak, MD as Attending Physician (Radiation Oncology) Shann Medal, MD as Referring Physician (Radiology)  DIAGNOSIS:  Encounter Diagnosis  Name Primary?   Malignant neoplasm of upper-outer quadrant of left breast in female, estrogen receptor negative (HCC) Yes    SUMMARY OF ONCOLOGIC HISTORY: Oncology History  Malignant neoplasm of upper-outer quadrant of left breast in female, estrogen receptor negative (HCC)  09/02/1994 Surgery   status post right lumpectomy 09/02/1994 for ductal carcinoma in situ             (a) status post adjuvant radiation 09/21/1994 through 11/04/1994, total 5040 centigrade to the breast and 604 0 cGy to the tumor bed             (b) did not receive adjuvant antiestrogens   07/22/2017 Initial Biopsy    status post left breast upper outer quadrant biopsy for a clinical T1c pN0, stage IB invasive ductal carcinoma, grade 3, triple negative, with an MIB-1 of 50%.             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  The Multi-Gene Panel offered by [facility] includes sequencing and/or deletion  duplication testing of the following 83 genes: ALK, APC, ATM, AXIN2,BAP1,  BARD1, BLM, BMPR1A, BRCA1, BRCA2, BRIP1, CASR, CDC73, CDH1, CDK4, CDKN1B, CDKN1C, CDKN2A (p14ARF), CDKN2A (p16INK4a), CEBPA, CHEK2, CTNNA1, DICER1, DIS3L2, EGFR (c.2369C>T, p.Thr790Met variant only), EPCAM (Deletion/duplication testing only), FH, FLCN, GATA2, GPC3, GREM1 (Promoter region deletion/duplication testing only), HOXB13 (c.251G>A, p.Gly84Glu), HRAS, KIT, MAX, MEN1, MET, MITF (c.952G>A, p.Glu318Lys variant only), MLH1, MSH2, MSH3, MSH6, MUTYH, NBN, NF1, NF2, NTHL1, PALB2, PDGFRA, PHOX2B, PMS2, POLD1, POLE, POT1, PRKAR1A, PTCH1, PTEN, RAD50, RAD51C, RAD51D, RB1, RECQL4, RET, RUNX1, SDHAF2, SDHA (sequence changes only), SDHB, SDHC, SDHD, SMAD4, SMARCA4, SMARCB1, SMARCE1, STK11, SUFU, TERT, TERT, TMEM127, TP53, TSC1, TSC2, VHL, WRN and WT1.  The report date is August 02, 2017.    08/24/2017 - 11/23/2017 Neo-Adjuvant Chemotherapy    chemotherapy consisting of doxorubicin/ cyclophosphamide in dose dense fashion x4 started 08/24/2017, completed 10/12/2017,  followed by paclitaxel/ carboplatin weekly              (a) carboplatin/paclitaxel discontinued after 3 doses, stopped 11/23/2017, due to cytopenias             (b) post chemo MRI 12/04/2017 findings a complete imaging response   12/15/2017 Surgery   status post left lumpectomy and sentinel lymph node sampling showing a complete pathologic response (ypT0, ypN0)   02/07/2018 - 03/04/2018 Radiation Therapy   adjuvant radiation with Dr. Basilio Cairo: Left breast 40.05 Gy in 15 fractions,  boost: 10 Gy in 5 fractions      CHIEF COMPLIANT: Follow-up to review the result of bone scan  HISTORY OF PRESENT ILLNESS:   History of Present Illness   The patient, with a history of breast cancer sustained a fracture in the left arm due to a fall while playing pickleball, is currently undergoing rehabilitation. The fracture occurred in September and was severe enough to require a plate and  thirteen pins. The patient reports that the last couple of days have been the worst in terms of pain, despite being eight weeks post-injury. The patient is due for a follow-up with the orthopedic doctor.  The patient also has two spots on the right side, likely related to a previous condition. These spots have remained unchanged, and no other findings were reported in the recent bone scan. The patient has not had a blood work done in a year and a half and is currently looking for a new family doctor.         ALLERGIES:  is allergic to amoxicillin.  MEDICATIONS:  Current Outpatient Medications  Medication Sig Dispense Refill   ascorbic acid (VITAMIN C) 500 MG tablet Take 500 mg by mouth daily.     BIOTIN 5000 5 MG CAPS Take by mouth.     HYDROcodone-acetaminophen (NORCO/VICODIN) 5-325 MG tablet Take 1 tablet by mouth every 6 (six) hours as needed. 10 tablet 0   Inulin-Calcium-Vitamin D 2-250-100 GM-MG-UNIT CHEW Chew by mouth daily.     Multiple Vitamin (MULTI-VITAMIN) tablet Take 1 tablet by mouth daily.     Polyethylene Glycol 3350 (MIRALAX PO) Take by mouth.     senna-docusate (SENOKOT-S) 8.6-50 MG tablet Take 1 tablet by mouth daily. 30 tablet 0   No current facility-administered medications for this visit.    PHYSICAL EXAMINATION: ECOG PERFORMANCE STATUS: 1 - Symptomatic but completely ambulatory  Vitals:   04/13/23 1051  BP: (!) 180/78  Pulse: 68  Resp: 18  Temp: 97.8 F (36.6 C)  SpO2: 100%   Filed Weights   04/13/23 1051  Weight: 126 lb 11.2 oz (57.5 kg)     LABORATORY DATA:  I have reviewed the data as listed    Latest Ref Rng & Units 09/24/2021   10:54 AM 09/25/2020    2:37 PM 08/14/2019    9:18 AM  CMP  Glucose 70 - 99 mg/dL 89  161  67   BUN 8 - 23 mg/dL 12  17  13    Creatinine 0.44 - 1.00 mg/dL 0.96  0.45  4.09   Sodium 135 - 145 mmol/L 139  145  140   Potassium 3.5 - 5.1 mmol/L 4.2  3.9  3.7   Chloride 98 - 111 mmol/L 106  110  107   CO2 22 - 32 mmol/L 27   27  25    Calcium 8.9 - 10.3 mg/dL 9.2  9.5  9.1   Total Protein 6.5 - 8.1 g/dL 6.7  7.3  6.7   Total Bilirubin 0.3 - 1.2 mg/dL 0.7  0.2  0.6   Alkaline Phos 38 - 126 U/L 51  56  76   AST 15 - 41 U/L 18  19  13    ALT 0 - 44 U/L 11  11  8      Lab Results  Component Value Date   WBC 5.5 04/13/2023   HGB 13.7 04/13/2023   HCT 41.9 04/13/2023   MCV 88.8 04/13/2023   PLT 194 04/13/2023   NEUTROABS 3.3  04/13/2023    ASSESSMENT & PLAN:  Malignant neoplasm of upper-outer quadrant of left breast in female, estrogen receptor negative (HCC) 09/02/1994: Rt Lumpectomy: DCIS 09/21/1994-11/04/1994: XRT (did not receive Anti estrogens)   07/22/2017: T1c N0 Satge 1B: Grade 3 IDC TNBC Ki 67: 50% 08/02/2017: Genetics Neg 08/24/17-11/23/2017:Neo Adj chemo DD AC x 4, Taxol-carbo X 3 (stopped for cytopenias) 12/15/2017: Left Lumpectomy: Path CR 02/07/2018-03/04/2018: XRT   Breast Cancer Surveillance: 1. Breast exam 04/13/2023: Normal 2. Mammogram 09/01/2022 no abnormalities. Postsurgical changes. Breast Density Category B. 3.  Bone scan 10/02/2022: Bone mets right calvarium, resolution of uptake in the lumbar spine and anterior fifth rib 4.  Bone scan 04/13/2023: Increased uptake in the distal left radius consistent with known subacute fracture.  2 foci in the right calvarium no other suspicious findings.       Left Radius Fracture Healing from a fracture sustained in September due to a fall during a pickleball game. Currently experiencing increased pain and undergoing rehabilitation. -Continue with rehabilitation. -Follow-up with orthopedic surgeon.  Bone Lesions Stable bone lesions on the right side, with no new findings on the recent bone scan. Previous spots on the rib and back are no longer visible. -Continue monitoring with bone scans every six months.  General Health Maintenance -Order routine blood work today to check liver and kidney function. -Schedule a follow-up appointment in six months.       Orders Placed This Encounter  Procedures   NM Bone Scan Whole Body    Standing Status:   Future    Standing Expiration Date:   04/12/2024    Order Specific Question:   If indicated for the ordered procedure, I authorize the administration of a radiopharmaceutical per Radiology protocol    Answer:   Yes    Order Specific Question:   Preferred imaging location?    Answer:   [facility]    Order Specific Question:   Release to patient    Answer:   Immediate   CBC with Differential (Cancer Center Only)    Standing Status:   Future    Number of Occurrences:   1    Standing Expiration Date:   04/12/2024   CMP (Cancer Center only)    Standing Status:   Future    Number of Occurrences:   1    Standing Expiration Date:   04/12/2024   The patient has a good understanding of the overall plan. she agrees with it. she will call with any problems that may develop before the next visit here. Total time spent: 30 mins including face to face time and time spent for planning, charting and co-ordination of care   Tamsen Meek, MD 04/13/23

## 2023-04-14 ENCOUNTER — Encounter: Payer: Self-pay | Admitting: Internal Medicine

## 2023-04-14 DIAGNOSIS — S52502D Unspecified fracture of the lower end of left radius, subsequent encounter for closed fracture with routine healing: Secondary | ICD-10-CM | POA: Diagnosis not present

## 2023-04-14 DIAGNOSIS — M25642 Stiffness of left hand, not elsewhere classified: Secondary | ICD-10-CM | POA: Diagnosis not present

## 2023-04-14 DIAGNOSIS — Z4789 Encounter for other orthopedic aftercare: Secondary | ICD-10-CM | POA: Diagnosis not present

## 2023-04-14 DIAGNOSIS — M25512 Pain in left shoulder: Secondary | ICD-10-CM | POA: Diagnosis not present

## 2023-04-14 DIAGNOSIS — G90519 Complex regional pain syndrome I of unspecified upper limb: Secondary | ICD-10-CM | POA: Diagnosis not present

## 2023-04-14 DIAGNOSIS — M25632 Stiffness of left wrist, not elsewhere classified: Secondary | ICD-10-CM | POA: Diagnosis not present

## 2023-04-14 DIAGNOSIS — S52602A Unspecified fracture of lower end of left ulna, initial encounter for closed fracture: Secondary | ICD-10-CM | POA: Diagnosis not present

## 2023-04-19 DIAGNOSIS — M25632 Stiffness of left wrist, not elsewhere classified: Secondary | ICD-10-CM | POA: Diagnosis not present

## 2023-04-19 DIAGNOSIS — M25642 Stiffness of left hand, not elsewhere classified: Secondary | ICD-10-CM | POA: Diagnosis not present

## 2023-04-20 DIAGNOSIS — M25632 Stiffness of left wrist, not elsewhere classified: Secondary | ICD-10-CM | POA: Diagnosis not present

## 2023-04-20 DIAGNOSIS — M25642 Stiffness of left hand, not elsewhere classified: Secondary | ICD-10-CM | POA: Diagnosis not present

## 2023-04-28 DIAGNOSIS — M25632 Stiffness of left wrist, not elsewhere classified: Secondary | ICD-10-CM | POA: Diagnosis not present

## 2023-04-28 DIAGNOSIS — M25642 Stiffness of left hand, not elsewhere classified: Secondary | ICD-10-CM | POA: Diagnosis not present

## 2023-04-28 DIAGNOSIS — Z4789 Encounter for other orthopedic aftercare: Secondary | ICD-10-CM | POA: Diagnosis not present

## 2023-04-28 DIAGNOSIS — G90519 Complex regional pain syndrome I of unspecified upper limb: Secondary | ICD-10-CM | POA: Diagnosis not present

## 2023-04-28 DIAGNOSIS — M7502 Adhesive capsulitis of left shoulder: Secondary | ICD-10-CM | POA: Diagnosis not present

## 2023-04-28 DIAGNOSIS — S52602D Unspecified fracture of lower end of left ulna, subsequent encounter for closed fracture with routine healing: Secondary | ICD-10-CM | POA: Diagnosis not present

## 2023-04-28 DIAGNOSIS — S52502D Unspecified fracture of the lower end of left radius, subsequent encounter for closed fracture with routine healing: Secondary | ICD-10-CM | POA: Diagnosis not present

## 2023-04-30 DIAGNOSIS — M25632 Stiffness of left wrist, not elsewhere classified: Secondary | ICD-10-CM | POA: Diagnosis not present

## 2023-04-30 DIAGNOSIS — M25642 Stiffness of left hand, not elsewhere classified: Secondary | ICD-10-CM | POA: Diagnosis not present

## 2023-05-03 DIAGNOSIS — M25632 Stiffness of left wrist, not elsewhere classified: Secondary | ICD-10-CM | POA: Diagnosis not present

## 2023-05-03 DIAGNOSIS — M25642 Stiffness of left hand, not elsewhere classified: Secondary | ICD-10-CM | POA: Diagnosis not present

## 2023-05-05 DIAGNOSIS — M25632 Stiffness of left wrist, not elsewhere classified: Secondary | ICD-10-CM | POA: Diagnosis not present

## 2023-05-05 DIAGNOSIS — M25642 Stiffness of left hand, not elsewhere classified: Secondary | ICD-10-CM | POA: Diagnosis not present

## 2023-05-12 DIAGNOSIS — M25632 Stiffness of left wrist, not elsewhere classified: Secondary | ICD-10-CM | POA: Diagnosis not present

## 2023-05-13 DIAGNOSIS — M25632 Stiffness of left wrist, not elsewhere classified: Secondary | ICD-10-CM | POA: Diagnosis not present

## 2023-05-17 DIAGNOSIS — M25632 Stiffness of left wrist, not elsewhere classified: Secondary | ICD-10-CM | POA: Diagnosis not present

## 2023-05-20 DIAGNOSIS — S52502D Unspecified fracture of the lower end of left radius, subsequent encounter for closed fracture with routine healing: Secondary | ICD-10-CM | POA: Diagnosis not present

## 2023-05-20 DIAGNOSIS — S52602D Unspecified fracture of lower end of left ulna, subsequent encounter for closed fracture with routine healing: Secondary | ICD-10-CM | POA: Diagnosis not present

## 2023-05-20 DIAGNOSIS — G90519 Complex regional pain syndrome I of unspecified upper limb: Secondary | ICD-10-CM | POA: Diagnosis not present

## 2023-05-20 DIAGNOSIS — M7502 Adhesive capsulitis of left shoulder: Secondary | ICD-10-CM | POA: Diagnosis not present

## 2023-05-20 DIAGNOSIS — Z4789 Encounter for other orthopedic aftercare: Secondary | ICD-10-CM | POA: Diagnosis not present

## 2023-05-20 DIAGNOSIS — M25632 Stiffness of left wrist, not elsewhere classified: Secondary | ICD-10-CM | POA: Diagnosis not present

## 2023-05-24 DIAGNOSIS — M25632 Stiffness of left wrist, not elsewhere classified: Secondary | ICD-10-CM | POA: Diagnosis not present

## 2023-05-31 DIAGNOSIS — M25632 Stiffness of left wrist, not elsewhere classified: Secondary | ICD-10-CM | POA: Diagnosis not present

## 2023-05-31 DIAGNOSIS — M25512 Pain in left shoulder: Secondary | ICD-10-CM | POA: Diagnosis not present

## 2023-06-02 DIAGNOSIS — M25632 Stiffness of left wrist, not elsewhere classified: Secondary | ICD-10-CM | POA: Diagnosis not present

## 2023-06-07 DIAGNOSIS — M25632 Stiffness of left wrist, not elsewhere classified: Secondary | ICD-10-CM | POA: Diagnosis not present

## 2023-06-08 DIAGNOSIS — L578 Other skin changes due to chronic exposure to nonionizing radiation: Secondary | ICD-10-CM | POA: Diagnosis not present

## 2023-06-08 DIAGNOSIS — D225 Melanocytic nevi of trunk: Secondary | ICD-10-CM | POA: Diagnosis not present

## 2023-06-08 DIAGNOSIS — L570 Actinic keratosis: Secondary | ICD-10-CM | POA: Diagnosis not present

## 2023-06-08 DIAGNOSIS — D485 Neoplasm of uncertain behavior of skin: Secondary | ICD-10-CM | POA: Diagnosis not present

## 2023-06-08 DIAGNOSIS — L820 Inflamed seborrheic keratosis: Secondary | ICD-10-CM | POA: Diagnosis not present

## 2023-06-08 DIAGNOSIS — L821 Other seborrheic keratosis: Secondary | ICD-10-CM | POA: Diagnosis not present

## 2023-06-08 DIAGNOSIS — Z86018 Personal history of other benign neoplasm: Secondary | ICD-10-CM | POA: Diagnosis not present

## 2023-06-08 DIAGNOSIS — L309 Dermatitis, unspecified: Secondary | ICD-10-CM | POA: Diagnosis not present

## 2023-06-08 DIAGNOSIS — D224 Melanocytic nevi of scalp and neck: Secondary | ICD-10-CM | POA: Diagnosis not present

## 2023-06-10 DIAGNOSIS — M25632 Stiffness of left wrist, not elsewhere classified: Secondary | ICD-10-CM | POA: Diagnosis not present

## 2023-06-14 DIAGNOSIS — M7502 Adhesive capsulitis of left shoulder: Secondary | ICD-10-CM | POA: Diagnosis not present

## 2023-06-14 DIAGNOSIS — Z4789 Encounter for other orthopedic aftercare: Secondary | ICD-10-CM | POA: Diagnosis not present

## 2023-06-14 DIAGNOSIS — G90519 Complex regional pain syndrome I of unspecified upper limb: Secondary | ICD-10-CM | POA: Diagnosis not present

## 2023-06-14 DIAGNOSIS — M25632 Stiffness of left wrist, not elsewhere classified: Secondary | ICD-10-CM | POA: Diagnosis not present

## 2023-06-21 DIAGNOSIS — M25632 Stiffness of left wrist, not elsewhere classified: Secondary | ICD-10-CM | POA: Diagnosis not present

## 2023-06-24 DIAGNOSIS — M25632 Stiffness of left wrist, not elsewhere classified: Secondary | ICD-10-CM | POA: Diagnosis not present

## 2023-06-29 DIAGNOSIS — M25632 Stiffness of left wrist, not elsewhere classified: Secondary | ICD-10-CM | POA: Diagnosis not present

## 2023-07-01 DIAGNOSIS — M25632 Stiffness of left wrist, not elsewhere classified: Secondary | ICD-10-CM | POA: Diagnosis not present

## 2023-07-06 DIAGNOSIS — S52501A Unspecified fracture of the lower end of right radius, initial encounter for closed fracture: Secondary | ICD-10-CM | POA: Diagnosis not present

## 2023-07-06 DIAGNOSIS — M79641 Pain in right hand: Secondary | ICD-10-CM | POA: Insufficient documentation

## 2023-07-06 DIAGNOSIS — M25632 Stiffness of left wrist, not elsewhere classified: Secondary | ICD-10-CM | POA: Diagnosis not present

## 2023-07-07 DIAGNOSIS — M65332 Trigger finger, left middle finger: Secondary | ICD-10-CM | POA: Diagnosis not present

## 2023-07-07 DIAGNOSIS — M7502 Adhesive capsulitis of left shoulder: Secondary | ICD-10-CM | POA: Diagnosis not present

## 2023-07-07 DIAGNOSIS — S52501A Unspecified fracture of the lower end of right radius, initial encounter for closed fracture: Secondary | ICD-10-CM | POA: Diagnosis not present

## 2023-07-09 DIAGNOSIS — S52571A Other intraarticular fracture of lower end of right radius, initial encounter for closed fracture: Secondary | ICD-10-CM | POA: Diagnosis not present

## 2023-07-09 DIAGNOSIS — X58XXXA Exposure to other specified factors, initial encounter: Secondary | ICD-10-CM | POA: Diagnosis not present

## 2023-07-09 DIAGNOSIS — Y999 Unspecified external cause status: Secondary | ICD-10-CM | POA: Diagnosis not present

## 2023-07-22 DIAGNOSIS — Z4789 Encounter for other orthopedic aftercare: Secondary | ICD-10-CM | POA: Diagnosis not present

## 2023-08-05 DIAGNOSIS — Z4789 Encounter for other orthopedic aftercare: Secondary | ICD-10-CM | POA: Diagnosis not present

## 2023-08-05 DIAGNOSIS — M25631 Stiffness of right wrist, not elsewhere classified: Secondary | ICD-10-CM | POA: Diagnosis not present

## 2023-08-11 DIAGNOSIS — M25631 Stiffness of right wrist, not elsewhere classified: Secondary | ICD-10-CM | POA: Diagnosis not present

## 2023-08-17 DIAGNOSIS — M25631 Stiffness of right wrist, not elsewhere classified: Secondary | ICD-10-CM | POA: Diagnosis not present

## 2023-08-24 DIAGNOSIS — M25631 Stiffness of right wrist, not elsewhere classified: Secondary | ICD-10-CM | POA: Diagnosis not present

## 2023-08-31 DIAGNOSIS — M25631 Stiffness of right wrist, not elsewhere classified: Secondary | ICD-10-CM | POA: Diagnosis not present

## 2023-09-02 DIAGNOSIS — M25531 Pain in right wrist: Secondary | ICD-10-CM | POA: Diagnosis not present

## 2023-09-02 DIAGNOSIS — H903 Sensorineural hearing loss, bilateral: Secondary | ICD-10-CM | POA: Diagnosis not present

## 2023-09-02 DIAGNOSIS — M79641 Pain in right hand: Secondary | ICD-10-CM | POA: Diagnosis not present

## 2023-09-02 DIAGNOSIS — G90519 Complex regional pain syndrome I of unspecified upper limb: Secondary | ICD-10-CM | POA: Diagnosis not present

## 2023-09-02 DIAGNOSIS — Z4789 Encounter for other orthopedic aftercare: Secondary | ICD-10-CM | POA: Diagnosis not present

## 2023-09-02 DIAGNOSIS — Z9889 Other specified postprocedural states: Secondary | ICD-10-CM | POA: Diagnosis not present

## 2023-09-02 DIAGNOSIS — H6123 Impacted cerumen, bilateral: Secondary | ICD-10-CM | POA: Diagnosis not present

## 2023-09-02 DIAGNOSIS — S52501D Unspecified fracture of the lower end of right radius, subsequent encounter for closed fracture with routine healing: Secondary | ICD-10-CM | POA: Diagnosis not present

## 2023-09-02 DIAGNOSIS — M25631 Stiffness of right wrist, not elsewhere classified: Secondary | ICD-10-CM | POA: Diagnosis not present

## 2023-09-02 DIAGNOSIS — D333 Benign neoplasm of cranial nerves: Secondary | ICD-10-CM | POA: Diagnosis not present

## 2023-09-02 DIAGNOSIS — M25512 Pain in left shoulder: Secondary | ICD-10-CM | POA: Diagnosis not present

## 2023-09-07 DIAGNOSIS — Z1231 Encounter for screening mammogram for malignant neoplasm of breast: Secondary | ICD-10-CM | POA: Diagnosis not present

## 2023-09-10 ENCOUNTER — Emergency Department (HOSPITAL_BASED_OUTPATIENT_CLINIC_OR_DEPARTMENT_OTHER)

## 2023-09-10 ENCOUNTER — Emergency Department (HOSPITAL_BASED_OUTPATIENT_CLINIC_OR_DEPARTMENT_OTHER)
Admission: EM | Admit: 2023-09-10 | Discharge: 2023-09-10 | Disposition: A | Discharge status: Home / Self Care | Attending: Emergency Medicine | Admitting: Emergency Medicine

## 2023-09-10 ENCOUNTER — Other Ambulatory Visit: Payer: Self-pay

## 2023-09-10 DIAGNOSIS — Z853 Personal history of malignant neoplasm of breast: Secondary | ICD-10-CM | POA: Diagnosis not present

## 2023-09-10 DIAGNOSIS — R42 Dizziness and giddiness: Secondary | ICD-10-CM | POA: Diagnosis not present

## 2023-09-10 DIAGNOSIS — Z794 Long term (current) use of insulin: Secondary | ICD-10-CM | POA: Insufficient documentation

## 2023-09-10 DIAGNOSIS — R519 Headache, unspecified: Secondary | ICD-10-CM | POA: Insufficient documentation

## 2023-09-10 DIAGNOSIS — I10 Essential (primary) hypertension: Secondary | ICD-10-CM | POA: Diagnosis not present

## 2023-09-10 LAB — CBC WITH DIFFERENTIAL/PLATELET
Abs Immature Granulocytes: 0.02 10*3/uL (ref 0.00–0.07)
Basophils Absolute: 0 10*3/uL (ref 0.0–0.1)
Basophils Relative: 0 %
Eosinophils Absolute: 0.1 10*3/uL (ref 0.0–0.5)
Eosinophils Relative: 1 %
HCT: 43 % (ref 36.0–46.0)
Hemoglobin: 14.7 g/dL (ref 12.0–15.0)
Immature Granulocytes: 0 %
Lymphocytes Relative: 31 %
Lymphs Abs: 2.1 10*3/uL (ref 0.7–4.0)
MCH: 29.5 pg (ref 26.0–34.0)
MCHC: 34.2 g/dL (ref 30.0–36.0)
MCV: 86.3 fL (ref 80.0–100.0)
Monocytes Absolute: 0.5 10*3/uL (ref 0.1–1.0)
Monocytes Relative: 8 %
Neutro Abs: 4.1 10*3/uL (ref 1.7–7.7)
Neutrophils Relative %: 60 %
Platelets: 214 10*3/uL (ref 150–400)
RBC: 4.98 MIL/uL (ref 3.87–5.11)
RDW: 12.6 % (ref 11.5–15.5)
WBC: 6.9 10*3/uL (ref 4.0–10.5)
nRBC: 0 % (ref 0.0–0.2)

## 2023-09-10 LAB — BASIC METABOLIC PANEL WITH GFR
Anion gap: 9 (ref 5–15)
BUN: 15 mg/dL (ref 8–23)
CO2: 26 mmol/L (ref 22–32)
Calcium: 9.2 mg/dL (ref 8.9–10.3)
Chloride: 106 mmol/L (ref 98–111)
Creatinine, Ser: 0.79 mg/dL (ref 0.44–1.00)
GFR, Estimated: >60 mL/min (ref >60)
Glucose, Bld: 80 mg/dL (ref 70–99)
Potassium: 3.7 mmol/L (ref 3.5–5.1)
Sodium: 141 mmol/L (ref 135–145)

## 2023-09-10 MED ORDER — AMLODIPINE BESYLATE 5 MG PO TABS
5.0000 mg | ORAL_TABLET | Freq: Once | ORAL | Status: AC
  Administered 2023-09-10: 5 mg via ORAL
  Filled 2023-09-10: qty 1

## 2023-09-10 MED ORDER — PROCHLORPERAZINE MALEATE 10 MG PO TABS
10.0000 mg | ORAL_TABLET | Freq: Once | ORAL | Status: AC
  Administered 2023-09-10: 10 mg via ORAL
  Filled 2023-09-10: qty 1

## 2023-09-10 MED ORDER — MECLIZINE HCL 12.5 MG PO TABS: 12.5000 mg | ORAL_TABLET | Freq: Three times a day (TID) | ORAL | 0 refills | Status: DC | PRN

## 2023-09-10 MED ORDER — DIPHENHYDRAMINE HCL 25 MG PO CAPS
25.0000 mg | ORAL_CAPSULE | Freq: Once | ORAL | Status: AC
  Administered 2023-09-10: 25 mg via ORAL
  Filled 2023-09-10: qty 1

## 2023-09-10 MED ORDER — AMLODIPINE BESYLATE 5 MG PO TABS: 5.0000 mg | ORAL_TABLET | Freq: Every day | ORAL | 0 refills | Status: DC

## 2023-09-10 NOTE — ED Notes (Signed)
 Pt aware of the need for a urine... Unable to currently provide the sample.Stephanie KitchenMarland Bautista

## 2023-09-10 NOTE — ED Triage Notes (Signed)
 C/o headache/migraine since Wednesday morning.  Husbands states yesterday morning around 11 am, patient became "dizzy and unable to walk"  States she has had "brain tumor" in past.

## 2023-09-10 NOTE — ED Provider Notes (Signed)
 [place] EMERGENCY DEPARTMENT AT [facility] Provider Note   CSN: [id number] Arrival date & time: 09/10/23  1025     History  Chief Complaint  Patient presents with   Dizziness   Headache    Stephanie Bautista is a 73 y.o. female history of breast cancer, unilateral sensorineural hearing loss, swollen tumor of cranial nerve presented for dizziness that has been present since Wednesday.  Dizziness began at 10:30 AM and patient feels that she is unstable.  Patient denies any falls or being on blood thinners but states that she has a tumor in her head.  Patient states that she does have decreased hearing bilaterally as everything sounds like it is coming through water but no tinnitus.  Patient does not have a recent illnesses, chest pain or shortness of breath or urinary symptoms.  Patient denies any vision changes.  Patient states she has a headache that feels like a pressure on both sides of her forehead that is constant and was not maximal and sudden in onset.    Home Medications Prior to Admission medications   Medication Sig Start Date End Date Taking? Authorizing Provider  ascorbic acid (VITAMIN C) 500 MG tablet Take 500 mg by mouth daily.    [provider]  BIOTIN 5000 5 MG CAPS Take by mouth.    [provider]  HYDROcodone -acetaminophen  (NORCO/VICODIN) 5-325 MG tablet Take 1 tablet by mouth every 6 (six) hours as needed. 02/08/23   Kingsley, Victoria K, DO  Inulin-Calcium-Vitamin D 2-250-100 GM-MG-UNIT CHEW Chew by mouth daily.    [provider]  Multiple Vitamin (MULTI-VITAMIN) tablet Take 1 tablet by mouth daily.    [provider]  Polyethylene Glycol 3350  (MIRALAX  PO) Take by mouth.    [provider]  senna-docusate (SENOKOT-S) 8.6-50 MG tablet Take 1 tablet by mouth daily. 02/08/23   Kingsley, Victoria K, DO      Allergies    Amoxicillin    Review of Systems   Review of Systems  Neurological:  Positive for dizziness  and headaches.    Physical Exam Updated Vital Signs BP (!) 180/88 (BP Location: Left Arm)   Pulse 73   Temp 98 F (36.7 C) (Oral)   Resp 18   SpO2 99%  Physical Exam Constitutional:      Appearance: She is normal weight.     Comments: Unable to reproduce headache when palpating sinuses  HENT:     Head: Normocephalic.     Comments: No temporal artery tenderness No jaw claudication Eyes:     Extraocular Movements: Extraocular movements intact.     Conjunctiva/sclera: Conjunctivae normal.     Pupils: Pupils are equal, round, and reactive to light.  Cardiovascular:     Rate and Rhythm: Normal rate and regular rhythm.     Pulses: Normal pulses.     Heart sounds: Normal heart sounds.  Pulmonary:     Effort: Pulmonary effort is normal.     Breath sounds: Normal breath sounds.  Abdominal:     Palpations: Abdomen is soft.     Tenderness: There is no abdominal tenderness. There is no guarding or rebound.  Musculoskeletal:        General: Normal range of motion.     Cervical back: Normal range of motion. No rigidity or tenderness.  Skin:    General: Skin is warm and dry.     Capillary Refill: Capillary refill takes less than 2 seconds.  Neurological:     General:  No focal deficit present.     Mental Status: She is alert.     Sensory: Sensation is intact.     Motor: Motor function is intact.     Coordination: Coordination is intact.     Gait: Gait is intact.     Comments: Cranial nerves III through XII intact Vision grossly intact Sensation tact in all 4 extremities  Psychiatric:        Mood and Affect: Mood normal.     ED Results / Procedures / Treatments   Labs (all labs ordered are listed, but only abnormal results are displayed) Labs Reviewed  BASIC METABOLIC PANEL WITH GFR  CBC WITH DIFFERENTIAL/PLATELET  URINALYSIS, ROUTINE W REFLEX MICROSCOPIC    EKG None  Radiology No results found.  Procedures Procedures    Medications Ordered in ED Medications -  No data to display  ED Course/ Medical Decision Making/ A&P                                 Medical Decision Making Amount and/or Complexity of Data Reviewed Labs: ordered. Radiology: ordered.  Risk Prescription drug management.   Stephanie Bautista 81 y.o. presented today for headache. Working DDx that I considered at this time includes, but not limited to, sinusitis, secondary to allergies, tension headache, migraine, intracranial mass, intracranial hemorrhage, intracranial infection including meningitis vs encephalitis, GCA, trigeminal neuralgia, AVM, sinusitis, cerebral aneurysm, muscular headache, cavernous sinus thrombosis, carotid artery dissection.  R/o DDx: intracranial mass, intracranial hemorrhage, intracranial infection including meningitis vs encephalitis, GCA, trigeminal neuralgia,  AVM, sinusitis, cerebral aneurysm, muscular headache, cavernous sinus thrombosis, carotid artery dissection:  less likely due to history of present illness, physical exam, labs/imaging findings  Review of prior external notes: 09/02/2023 office visit  Unique Tests and My Independent Interpretation:  CBC: Unremarkable BMP: Unremarkable CT Head w/o Contrast: Unremarkable  Social Determinants of Health: none  Discussion with Independent Historian:  Husband  Discussion of Management of Tests: None  Risk: Medium: prescription drug management  Risk Stratification Score: None  Staffed with Efraim Grange, MD  Plan: On exam patient was no acute distress with hypertension of 180 systolic. Physical exam was remarkable for fluid behind both eardrums that was not purulent and seen that she has a frontal headache suspicious of possible sinusitis.  Patient was able to bear weight and walk without abnormalities and has no neurologic symptoms on exam. Pt is afebrile with no focal neuro deficits, nuchal rigidity, or change in vision. Labs and imaging will be ordered. Patient will be given Compazine  and Benadryl   for HA treatment. Patient stable at this time.  Patient headache treated and improved while in ED. labs and imaging are reassuring and after I spoke with the patient patient and husband are comfortable being discharged.  Patient was hypertensive here and after speaking with the husband this has happened before where she has had blood pressures in the 170s at previous visits but is not currently on any blood pressure meds.  After discussion with both of them we agreed to start patient on amlodipine  and to follow-up closely with her primary care provider as she will need long-term treatment for her hypertension.  Patient is to follow up with PCP to discuss prophylactic medication.  Discussed taking Tylenol  every 6 hours as needed for pain.  After shared decision making we did agreed on meclizine  for the next few days and so we will order this.  I did give the patient the option to use either meclizine  or other antihistamines but do not use both as this could cause anticholinergic side effects to which the patient and husband both verbalized understanding and acceptance of.  Patient was given return precautions. Patient stable for discharge at this time.  Patient verbalized understanding of plan.  This chart was dictated using voice recognition software.  Despite best efforts to proofread,  errors can occur which can change the documentation meaning.        Final Clinical Impression(s) / ED Diagnoses Final diagnoses:  None    Rx / DC Orders ED Discharge Orders     None         Elex Grimmer 09/10/23 1256    Ninetta Basket, MD 09/10/23 [phone]

## 2023-09-10 NOTE — Discharge Instructions (Addendum)
 Please follow-up with your primary care provider in regards to recent symptoms and ER visit.  Today your labs and imaging are reassuring and you may have a version of sinusitis or seasonal allergies causing your symptoms as there does appear to be fluid behind your ears.  Please take Tylenol  every 6 hours needed for pain along with antihistamine such as Claritin or Zyrtec.  I have also prescribed meclizine  to help if you want to take that instead however I would be cautious taking this with other antihistamines as this could cause unwanted side effects.  Your blood pressure was also elevated today and since you have had a few visits with high blood pressure we have started you on blood pressure meds but you will need to follow-up with your primary care provider for long-term management of this.  If symptoms change or worsen please return to the ER.

## 2023-09-14 ENCOUNTER — Telehealth: Payer: Self-pay | Admitting: Internal Medicine

## 2023-09-14 NOTE — Telephone Encounter (Signed)
 Copied from CRM [id number]. Topic: General - Other >> Sep 14, 2023  2:02 PM Adonis Hoot wrote: Reason for CRM: Patient is friends of patient Stephanie Bautista and her husband skip Bautista.She would like to know if Dr Donnette Gal would take her as a new patient of hers?She came highly recommended by her friends.

## 2023-09-15 NOTE — Telephone Encounter (Signed)
 Yes, I can except her.  11 AM slot ideally.

## 2023-09-16 DIAGNOSIS — M25631 Stiffness of right wrist, not elsewhere classified: Secondary | ICD-10-CM | POA: Diagnosis not present

## 2023-09-21 DIAGNOSIS — M25631 Stiffness of right wrist, not elsewhere classified: Secondary | ICD-10-CM | POA: Diagnosis not present

## 2023-09-21 DIAGNOSIS — I10 Essential (primary) hypertension: Secondary | ICD-10-CM | POA: Insufficient documentation

## 2023-09-21 NOTE — Patient Instructions (Incomplete)
   Try the Epley maneuver  for your ears.      Your BP goal is 130/80   Medications changes include :   increase amlodipine  to 7.5 mg daily.  Try stopping the meclizine   -  you can take as needed    Return in about 6 months (around 03/23/2024) for follow up, hypertension.

## 2023-09-21 NOTE — Assessment & Plan Note (Signed)
 Chronic Following with ENT MRI of brain 08/2023 - stable appearance

## 2023-09-21 NOTE — Progress Notes (Unsigned)
      Subjective:    Patient ID: Stephanie Bautista, female    DOB: 06/13/1950, 73 y.o.   MRN: [id number]     HPI Stephanie Bautista is here for follow up of her chronic medical problems.  She is here to establish with a new pcp.    Went to the ED/15/25 for intractable headache and new onset hypertension with systolic BP at 180.  She was started on amlodipine .  She received Compazine  and Benadryl  for her headache which did improve.  Medications and allergies reviewed with patient and updated if appropriate.  Current Outpatient Medications on File Prior to Visit  Medication Sig Dispense Refill   amLODipine  (NORVASC ) 5 MG tablet Take 1 tablet (5 mg total) by mouth daily. 30 tablet 0   ascorbic acid (VITAMIN C) 500 MG tablet Take 500 mg by mouth daily.     BIOTIN 5000 5 MG CAPS Take by mouth.     HYDROcodone -acetaminophen  (NORCO/VICODIN) 5-325 MG tablet Take 1 tablet by mouth every 6 (six) hours as needed. 10 tablet 0   Inulin-Calcium-Vitamin D 2-250-100 GM-MG-UNIT CHEW Chew by mouth daily.     meclizine  (ANTIVERT ) 12.5 MG tablet Take 1 tablet (12.5 mg total) by mouth 3 (three) times daily as needed for dizziness. 30 tablet 0   Multiple Vitamin (MULTI-VITAMIN) tablet Take 1 tablet by mouth daily.     Polyethylene Glycol 3350  (MIRALAX  PO) Take by mouth.     senna-docusate (SENOKOT-S) 8.6-50 MG tablet Take 1 tablet by mouth daily. 30 tablet 0   No current facility-administered medications on file prior to visit.     Review of Systems     Objective:  There were no vitals filed for this visit. BP Readings from Last 3 Encounters:  09/10/23 (!) 180/89  04/13/23 (!) 180/78  02/08/23 (!) 178/75   Wt Readings from Last 3 Encounters:  04/13/23 126 lb 11.2 oz (57.5 kg)  02/08/23 123 lb 14.4 oz (56.2 kg)  10/06/22 124 lb (56.2 kg)   There is no height or weight on file to calculate BMI.    Physical Exam     Lab Results  Component Value Date   WBC 6.9 09/10/2023   HGB 14.7 09/10/2023   HCT  43.0 09/10/2023   PLT 214 09/10/2023   GLUCOSE 80 09/10/2023   ALT 9 04/13/2023   AST 17 04/13/2023   NA 141 09/10/2023   K 3.7 09/10/2023   CL 106 09/10/2023   CREATININE 0.79 09/10/2023   BUN 15 09/10/2023   CO2 26 09/10/2023   TSH 1.036 11/16/2011     Assessment & Plan:    See Problem List for Assessment and Plan of chronic medical problems.

## 2023-09-21 NOTE — Assessment & Plan Note (Signed)
 New Diagnosed 09/07/2023 in the ED-had several elevated BP measures in the past Started on amlodipine  5 mg daily Blood pressure initially elevated here, but improved Advised to start monitoring BP at home Increase amlodipine  to 7.5 mg daily

## 2023-09-22 ENCOUNTER — Encounter: Payer: Self-pay | Admitting: Internal Medicine

## 2023-09-22 ENCOUNTER — Ambulatory Visit (INDEPENDENT_AMBULATORY_CARE_PROVIDER_SITE_OTHER): Admitting: Internal Medicine

## 2023-09-22 VITALS — BP 134/80 | HR 80 | Temp 98.0°F | Ht 64.0 in | Wt 127.0 lb

## 2023-09-22 DIAGNOSIS — M810 Age-related osteoporosis without current pathological fracture: Secondary | ICD-10-CM | POA: Insufficient documentation

## 2023-09-22 DIAGNOSIS — I10 Essential (primary) hypertension: Secondary | ICD-10-CM

## 2023-09-22 DIAGNOSIS — R2689 Other abnormalities of gait and mobility: Secondary | ICD-10-CM | POA: Insufficient documentation

## 2023-09-22 DIAGNOSIS — D333 Benign neoplasm of cranial nerves: Secondary | ICD-10-CM

## 2023-09-22 DIAGNOSIS — R42 Dizziness and giddiness: Secondary | ICD-10-CM | POA: Diagnosis not present

## 2023-09-22 MED ORDER — AMLODIPINE BESYLATE 5 MG PO TABS: 7.5000 mg | ORAL_TABLET | Freq: Every day | ORAL | 5 refills | Status: DC

## 2023-09-22 NOTE — Assessment & Plan Note (Signed)
 Chronic Has been following with Dr. Perri Brake Was recommended to consider Reclast, but is reluctant to take medications Advise discussing with her oncologist

## 2023-09-22 NOTE — Assessment & Plan Note (Signed)
 She states poor balance She had 2 falls last year-with the first 1 she broke her left wrist and with the second 1 she broke her right wrist Following with orthopedics, still in PT She is afraid of falling and that is likely part of what is contributing to the sensation of poor balance She did have some recent dizziness and was prescribed meclizine  in the ED Can try Epley maneuver to see if that helps She has not been exercising and I think doing some regular exercise and also specific balance exercises may help If there is no improvement in balance may need further evaluation-no back pain, lower extremity osteoarthritis/numbness/tingling contributing

## 2023-09-22 NOTE — Assessment & Plan Note (Signed)
 Did have some dizziness when she was in the emergency room and was prescribed meclizine  12.5 mg 3 times daily as needed She has been taking meclizine  once a day since then She denies any true dizziness now Recommended stopping meclizine  to see if she still needs a send can always restart if needed Discussed dizziness may be from the middle ear you could cause a spinning sensation-can take meclizine  as needed Try Epley maneuver at home Can refer to vestibular PT if needed

## 2023-09-23 DIAGNOSIS — S52501D Unspecified fracture of the lower end of right radius, subsequent encounter for closed fracture with routine healing: Secondary | ICD-10-CM | POA: Diagnosis not present

## 2023-09-23 DIAGNOSIS — G90519 Complex regional pain syndrome I of unspecified upper limb: Secondary | ICD-10-CM | POA: Diagnosis not present

## 2023-09-23 DIAGNOSIS — Z4789 Encounter for other orthopedic aftercare: Secondary | ICD-10-CM | POA: Diagnosis not present

## 2023-09-23 DIAGNOSIS — M25631 Stiffness of right wrist, not elsewhere classified: Secondary | ICD-10-CM | POA: Diagnosis not present

## 2023-09-23 DIAGNOSIS — M25531 Pain in right wrist: Secondary | ICD-10-CM | POA: Diagnosis not present

## 2023-09-28 DIAGNOSIS — M25631 Stiffness of right wrist, not elsewhere classified: Secondary | ICD-10-CM | POA: Diagnosis not present

## 2023-09-28 DIAGNOSIS — M25641 Stiffness of right hand, not elsewhere classified: Secondary | ICD-10-CM | POA: Diagnosis not present

## 2023-10-05 DIAGNOSIS — M25631 Stiffness of right wrist, not elsewhere classified: Secondary | ICD-10-CM | POA: Diagnosis not present

## 2023-10-05 DIAGNOSIS — M25641 Stiffness of right hand, not elsewhere classified: Secondary | ICD-10-CM | POA: Diagnosis not present

## 2023-10-06 ENCOUNTER — Telehealth: Payer: Self-pay | Admitting: *Deleted

## 2023-10-06 NOTE — Telephone Encounter (Signed)
 Received call from pt requesting office visit with MD to further discuss the need for a bone scan.  Pt states she recently had a brain MRI with [facility] which showed a decrease in the lesions sizes on bone.  F/u appt scheduled for further discussion.  Pt notified of appt and verbalized understanding.

## 2023-10-07 DIAGNOSIS — M25631 Stiffness of right wrist, not elsewhere classified: Secondary | ICD-10-CM | POA: Diagnosis not present

## 2023-10-12 ENCOUNTER — Other Ambulatory Visit (HOSPITAL_COMMUNITY)

## 2023-10-12 ENCOUNTER — Encounter (HOSPITAL_COMMUNITY)

## 2023-10-12 DIAGNOSIS — M25631 Stiffness of right wrist, not elsewhere classified: Secondary | ICD-10-CM | POA: Diagnosis not present

## 2023-10-14 DIAGNOSIS — M25631 Stiffness of right wrist, not elsewhere classified: Secondary | ICD-10-CM | POA: Diagnosis not present

## 2023-10-19 ENCOUNTER — Inpatient Hospital Stay

## 2023-10-19 ENCOUNTER — Inpatient Hospital Stay: Attending: Hematology and Oncology | Admitting: Hematology and Oncology

## 2023-10-19 VITALS — BP 144/78 | HR 71 | Temp 98.0°F | Resp 18 | Ht 64.0 in | Wt 127.5 lb

## 2023-10-19 DIAGNOSIS — Z79899 Other long term (current) drug therapy: Secondary | ICD-10-CM | POA: Diagnosis not present

## 2023-10-19 DIAGNOSIS — Z9221 Personal history of antineoplastic chemotherapy: Secondary | ICD-10-CM | POA: Insufficient documentation

## 2023-10-19 DIAGNOSIS — Z1722 Progesterone receptor negative status: Secondary | ICD-10-CM | POA: Diagnosis not present

## 2023-10-19 DIAGNOSIS — Z79620 Long term (current) use of immunosuppressive biologic: Secondary | ICD-10-CM | POA: Diagnosis not present

## 2023-10-19 DIAGNOSIS — D333 Benign neoplasm of cranial nerves: Secondary | ICD-10-CM | POA: Insufficient documentation

## 2023-10-19 DIAGNOSIS — Z923 Personal history of irradiation: Secondary | ICD-10-CM | POA: Diagnosis not present

## 2023-10-19 DIAGNOSIS — R2689 Other abnormalities of gait and mobility: Secondary | ICD-10-CM

## 2023-10-19 DIAGNOSIS — C50412 Malignant neoplasm of upper-outer quadrant of left female breast: Secondary | ICD-10-CM | POA: Diagnosis not present

## 2023-10-19 DIAGNOSIS — Z1732 Human epidermal growth factor receptor 2 negative status: Secondary | ICD-10-CM | POA: Diagnosis not present

## 2023-10-19 DIAGNOSIS — C7951 Secondary malignant neoplasm of bone: Secondary | ICD-10-CM | POA: Diagnosis not present

## 2023-10-19 DIAGNOSIS — Z171 Estrogen receptor negative status [ER-]: Secondary | ICD-10-CM | POA: Insufficient documentation

## 2023-10-19 LAB — CBC WITH DIFFERENTIAL (CANCER CENTER ONLY)
Abs Immature Granulocytes: 0 10*3/uL (ref 0.00–0.07)
Basophils Absolute: 0 10*3/uL (ref 0.0–0.1)
Basophils Relative: 1 %
Eosinophils Absolute: 0 10*3/uL (ref 0.0–0.5)
Eosinophils Relative: 0 %
HCT: 41.3 % (ref 36.0–46.0)
Hemoglobin: 14.1 g/dL (ref 12.0–15.0)
Immature Granulocytes: 0 %
Lymphocytes Relative: 26 %
Lymphs Abs: 1.2 10*3/uL (ref 0.7–4.0)
MCH: 29.2 pg (ref 26.0–34.0)
MCHC: 34.1 g/dL (ref 30.0–36.0)
MCV: 85.5 fL (ref 80.0–100.0)
Monocytes Absolute: 0.3 10*3/uL (ref 0.1–1.0)
Monocytes Relative: 7 %
Neutro Abs: 3 10*3/uL (ref 1.7–7.7)
Neutrophils Relative %: 66 %
Platelet Count: 192 10*3/uL (ref 150–400)
RBC: 4.83 MIL/uL (ref 3.87–5.11)
RDW: 12.6 % (ref 11.5–15.5)
WBC Count: 4.6 10*3/uL (ref 4.0–10.5)
nRBC: 0 % (ref 0.0–0.2)

## 2023-10-19 LAB — CMP (CANCER CENTER ONLY)
ALT: 7 U/L (ref 0–44)
AST: 14 U/L — ABNORMAL LOW (ref 15–41)
Albumin: 4.5 g/dL (ref 3.5–5.0)
Alkaline Phosphatase: 57 U/L (ref 38–126)
Anion gap: 7 (ref 5–15)
BUN: 11 mg/dL (ref 8–23)
CO2: 27 mmol/L (ref 22–32)
Calcium: 9.7 mg/dL (ref 8.9–10.3)
Chloride: 107 mmol/L (ref 98–111)
Creatinine: 0.73 mg/dL (ref 0.44–1.00)
GFR, Estimated: >60 mL/min (ref >60)
Glucose, Bld: 93 mg/dL (ref 70–99)
Potassium: 3.8 mmol/L (ref 3.5–5.1)
Sodium: 141 mmol/L (ref 135–145)
Total Bilirubin: 0.7 mg/dL (ref 0.0–1.2)
Total Protein: 7.2 g/dL (ref 6.5–8.1)

## 2023-10-19 LAB — CORTISOL: Cortisol, Plasma: 9.7 ug/dL

## 2023-10-19 NOTE — Progress Notes (Signed)
 Patient Care Team: Colene Dauphin, MD as PCP - General (Internal Medicine) Magrinat, Rozella Cornfield, MD (Inactive) as Consulting Physician (Oncology) Enid Harry, MD as Consulting Physician (General Surgery) Leontine Rana, MD (Inactive) as Consulting Physician (Obstetrics and Gynecology) Gordy Lauber, MD as Consulting Physician (Endocrinology) Shermon Divine, MD as Consulting Physician (Sports Medicine) Hilarie Lovely, MD as Referring Physician (Otolaryngology) Colie Dawes, MD as Attending Physician (Radiation Oncology) Arcelia Bean, MD as Referring Physician (Radiology)  DIAGNOSIS:  Encounter Diagnosis  Name Primary?   Malignant neoplasm of upper-outer quadrant of left breast in female, estrogen receptor negative (HCC) Yes    SUMMARY OF ONCOLOGIC HISTORY: Oncology History  Malignant neoplasm of upper-outer quadrant of left breast in female, estrogen receptor negative (HCC)  09/02/1994 Surgery   status post right lumpectomy 09/02/1994 for ductal carcinoma in situ             (a) status post adjuvant radiation 09/21/1994 through 11/04/1994, total 5040 centigrade to the breast and 604 0 cGy to the tumor bed             (b) did not receive adjuvant antiestrogens   07/22/2017 Initial Biopsy    status post left breast upper outer quadrant biopsy for a clinical T1c pN0, stage IB invasive ductal carcinoma, grade 3, triple negative, with an MIB-1 of 50%.             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  The Multi-Gene Panel offered by [facility] includes sequencing and/or deletion  duplication testing of the following 83 genes: ALK, APC, ATM, AXIN2,BAP1,  BARD1, BLM, BMPR1A, BRCA1, BRCA2, BRIP1, CASR, CDC73, CDH1, CDK4, CDKN1B, CDKN1C, CDKN2A (p14ARF), CDKN2A (p16INK4a), CEBPA, CHEK2, CTNNA1, DICER1, DIS3L2, EGFR (c.2369C>T, p.Thr790Met variant only), EPCAM (Deletion/duplication testing only), FH, FLCN, GATA2, GPC3, GREM1 (Promoter region deletion/duplication testing only), HOXB13 (c.251G>A, p.Gly84Glu), HRAS, KIT, MAX, MEN1, MET, MITF (c.952G>A, p.Glu318Lys variant only), MLH1, MSH2, MSH3, MSH6, MUTYH, NBN, NF1, NF2, NTHL1, PALB2, PDGFRA, PHOX2B, PMS2, POLD1, POLE, POT1, PRKAR1A, PTCH1, PTEN, RAD50, RAD51C, RAD51D, RB1, RECQL4, RET, RUNX1, SDHAF2, SDHA (sequence changes only), SDHB, SDHC, SDHD, SMAD4, SMARCA4, SMARCB1, SMARCE1, STK11, SUFU, TERT, TERT, TMEM127, TP53, TSC1, TSC2, VHL, WRN and WT1.  The report date is August 02, 2017.    08/24/2017 - 11/23/2017 Neo-Adjuvant Chemotherapy    chemotherapy consisting of doxorubicin / cyclophosphamide  in dose dense fashion x4 started 08/24/2017, completed 10/12/2017,  followed by paclitaxel / carboplatin  weekly              (a) carboplatin /paclitaxel  discontinued after 3 doses, stopped 11/23/2017, due to cytopenias             (b) post chemo MRI 12/04/2017 findings a complete imaging response   12/15/2017 Surgery   status post left lumpectomy and sentinel lymph node sampling showing a complete pathologic response (ypT0, ypN0)   02/07/2018 - 03/04/2018 Radiation Therapy   adjuvant radiation with Dr. 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  She has experienced balance issues and dizziness for two months, starting after a severe migraine. Fluid was found behind her ears in the emergency room.  Vertigo has eased, but balance issues persist.  Her vestibular schwannoma is stable on imaging. Lying on the side of the tumor causes discomfort. She underwent gamma knife treatment for a pituitary tumor five years ago. She also has breast cancer.  Balance issues have significantly impacted her daily activities. She struggles to walk without assistance and has fallen twice. A persistent feeling of fullness in her head contributes to her instability.     ALLERGIES:  is allergic to amoxicillin.  MEDICATIONS:  Current Outpatient Medications  Medication Sig Dispense Refill   amLODipine  (NORVASC ) 5 MG tablet Take 1.5 tablets (7.5 mg total) by mouth daily. 45 tablet 5   ascorbic acid (VITAMIN C) 500 MG tablet Take 500 mg by mouth daily.     BIOTIN 5000 5 MG CAPS Take by mouth.     Inulin-Calcium-Vitamin D 2-250-100 GM-MG-UNIT CHEW Chew by mouth daily.     meclizine  (ANTIVERT ) 12.5 MG tablet Take 1 tablet (12.5 mg total) by mouth 3 (three) times daily as needed for dizziness. 30 tablet 0   Multiple Vitamin (MULTI-VITAMIN) tablet Take 1 tablet by mouth daily.     Polyethylene Glycol 3350  (MIRALAX  PO) Take by mouth.     senna-docusate (SENOKOT-S) 8.6-50 MG tablet Take 1 tablet by mouth daily. 30 tablet 0   No current facility-administered medications for this visit.    PHYSICAL EXAMINATION: ECOG PERFORMANCE STATUS: 1 - Symptomatic but completely ambulatory  There were no vitals filed for this visit. There were no vitals filed for this visit.  Physical Exam   (exam performed in the presence of a chaperone)  LABORATORY DATA:  I have reviewed the data as listed    Latest Ref Rng & Units 09/10/2023   11:09 AM 04/13/2023   11:31 AM 09/24/2021   10:54 AM  CMP  Glucose 70 - 99 mg/dL 80  88  89   BUN 8 - 23 mg/dL 15  12  12    Creatinine 0.44 - 1.00 mg/dL 1.61  0.96  0.45   Sodium 135 - 145 mmol/L 141  141  139   Potassium 3.5 - 5.1 mmol/L 3.7  3.7  4.2   Chloride 98 - 111 mmol/L 106  106   106   CO2 22 - 32 mmol/L 26  29  27    Calcium 8.9 - 10.3 mg/dL 9.2  9.7  9.2   Total Protein 6.5 - 8.1 g/dL  7.2  6.7   Total Bilirubin <1.2 mg/dL  0.6  0.7   Alkaline Phos 38 - 126 U/L  61  51   AST 15 - 41 U/L  17  18   ALT 0 - 44 U/L  9  11     Lab Results  Component Value Date   WBC 6.9 09/10/2023   HGB 14.7 09/10/2023   HCT 43.0 09/10/2023   MCV 86.3 09/10/2023   PLT 214 09/10/2023   NEUTROABS 4.1 09/10/2023    ASSESSMENT & PLAN:  Malignant neoplasm of upper-outer quadrant of left breast in female, estrogen receptor negative (HCC) 09/02/1994: Rt Lumpectomy:  DCIS 09/21/1994-11/04/1994: XRT (did not receive Anti estrogens)   07/22/2017: T1c N0 Satge 1B: Grade 3 IDC TNBC Ki 67: 50% 08/02/2017: Genetics Neg 08/24/17-11/23/2017:Neo Adj chemo DD AC x 4, Taxol -carbo X 3 (stopped for cytopenias) 12/15/2017: Left Lumpectomy: Path CR 02/07/2018-03/04/2018: XRT   Breast Cancer Surveillance: 1. Breast exam 04/13/2023: Normal 2. Mammogram 09/01/2022 no abnormalities. Postsurgical changes. Breast Density Category B. 3.  Bone scan 10/02/2022: Bone mets right calvarium, resolution of uptake in the lumbar spine and anterior fifth rib 4.  Bone scan 04/13/2023: Increased uptake in the distal left radius consistent with known subacute fracture.  2 foci in the right calvarium no other suspicious findings. 5.  09/10/2023: CT head: Done for headache: No acute intracranial abnormality 6.  09/02/2023: MRI brain: Unchanged left vestibular schwannoma, enhancing lesion right frontal calvarium and heterogeneously enhancing pituitary gland lesion also unchanged.  Radiology review and counseling: The MRI report seems to suggest that the calvarial metastases is stable and hence no immediate additional treatment is warranted.  Because she had triple negative breast cancer, oral treatments would not be beneficial.  Assessment & Plan Malignant neoplasm of upper-outer quadrant of left breast Condition stable with  unchanged bone lesions. Stability is positive. - Order CBC and CMP. - Check hormone levels including thyroid  panel. - Refer to neuro-oncologist Dr. Mark Sil for vestibular schwannoma evaluation.  Vestibular schwannoma MRI shows no growth. Tumor may cause imbalance. Differential includes vestibular issues, eye problems, hypoglycemia. - Refer to neuro-oncologist Dr. Mark Sil. - Order CBC and CMP. - Check hormone levels including thyroid  panel. - Follow up with ophthalmologist.      No orders of the defined types were placed in this encounter.  The patient has a good understanding of the overall plan. she agrees with it. she will call with any problems that may develop before the next visit here. Total time spent: 30 mins including face to face time and time spent for planning, charting and co-ordination of care   Viinay K Shari Natt, MD 10/19/23

## 2023-10-19 NOTE — Assessment & Plan Note (Signed)
 09/02/1994: Rt Lumpectomy: DCIS 09/21/1994-11/04/1994: XRT (did not receive Anti estrogens)   07/22/2017: T1c N0 Satge 1B: Grade 3 IDC TNBC Ki 67: 50% 08/02/2017: Genetics Neg 08/24/17-11/23/2017:Neo Adj chemo DD AC x 4, Taxol -carbo X 3 (stopped for cytopenias) 12/15/2017: Left Lumpectomy: Path CR 02/07/2018-03/04/2018: XRT   Breast Cancer Surveillance: 1. Breast exam 04/13/2023: Normal 2. Mammogram 09/01/2022 no abnormalities. Postsurgical changes. Breast Density Category B. 3.  Bone scan 10/02/2022: Bone mets right calvarium, resolution of uptake in the lumbar spine and anterior fifth rib 4.  Bone scan 04/13/2023: Increased uptake in the distal left radius consistent with known subacute fracture.  2 foci in the right calvarium no other suspicious findings. 5.  09/10/2023: CT head: Done for headache: No acute intracranial abnormality 6.  09/02/2023: MRI brain: Unchanged left vestibular schwannoma, enhancing lesion right frontal calvarium and heterogeneously enhancing pituitary gland lesion also unchanged.  Radiology review and counseling: The MRI report seems to suggest that the calvarial metastases is stable and hence no immediate additional treatment is warranted.  Because she had triple negative breast cancer, oral treatments would not be beneficial.

## 2023-10-20 ENCOUNTER — Inpatient Hospital Stay: Admitting: Hematology and Oncology

## 2023-10-20 DIAGNOSIS — C50412 Malignant neoplasm of upper-outer quadrant of left female breast: Secondary | ICD-10-CM | POA: Diagnosis not present

## 2023-10-20 DIAGNOSIS — Z171 Estrogen receptor negative status [ER-]: Secondary | ICD-10-CM | POA: Diagnosis not present

## 2023-10-20 LAB — THYROID PANEL WITH TSH
Free Thyroxine Index: 1.8 (ref 1.2–4.9)
T3 Uptake Ratio: 26 % (ref 24–39)
T4, Total: 7.1 ug/dL (ref 4.5–12.0)
TSH: 1.64 u[IU]/mL (ref 0.450–4.500)

## 2023-10-20 LAB — ACTH: C206 ACTH: 9.6 pg/mL (ref 7.2–63.3)

## 2023-10-20 NOTE — Progress Notes (Signed)
 HEMATOLOGY-ONCOLOGY TELEPHONE VISIT PROGRESS NOTE  I connected with our patient on 10/20/23 at  7:45 AM EDT by telephone and verified that I am speaking with the correct person using two identifiers.  I discussed the limitations, risks, security and privacy concerns of performing an evaluation and management service by telephone and the availability of in person appointments.  I also discussed with the patient that there may be a patient responsible charge related to this service. The patient expressed understanding and agreed to proceed.   History of Present Illness:    History of Present Illness Stephanie Bautista is a 73 year old female with vestibular schwannoma and a history of breast cancer who presents for follow-up to review yesterday's blood work.  She was experiencing instability of the gait and frequent falls and wanted to be checked for any abnormalities in her blood work.  Recent blood work shows normal white blood cell count, red blood cells, and platelets, indicating no signs of anemia. Cortisone levels are normal. Thyroid  function test results are pending. Electrolytes, kidney, and liver function tests are normal, with AST levels better than normal.    Oncology History  Malignant neoplasm of upper-outer quadrant of left breast in female, estrogen receptor negative (HCC)  09/02/1994 Surgery   status post right lumpectomy 09/02/1994 for ductal carcinoma in situ             (a) status post adjuvant radiation 09/21/1994 through 11/04/1994, total 5040 centigrade to the breast and 604 0 cGy to the tumor bed             (b) did not receive adjuvant antiestrogens   07/22/2017 Initial Biopsy    status post left breast upper outer quadrant biopsy for a clinical T1c pN0, stage IB invasive ductal carcinoma, grade 3, triple negative, with an MIB-1 of 50%.             (a) lymph node biopsy on the same day was negative for malignancy (concordant)             (b) a second area of architectural  distortion was also biopsied, read as fibroadenoma (discordant)             (c) a third area of architectural distortion has not yet been biopsied             (d) breast MRI 07/28/2017 shows a clinical T2 N0 tumor   08/02/2017 Genetic Testing   WT1 c.332C>T VUS identified on the Multi-cancer gene panel.  The Multi-Gene Panel offered by [facility] includes sequencing and/or deletion duplication testing of the following 83 genes: ALK, APC, ATM, AXIN2,BAP1,  BARD1, BLM, BMPR1A, BRCA1, BRCA2, BRIP1, CASR, CDC73, CDH1, CDK4, CDKN1B, CDKN1C, CDKN2A (p14ARF), CDKN2A (p16INK4a), CEBPA, CHEK2, CTNNA1, DICER1, DIS3L2, EGFR (c.2369C>T, p.Thr790Met variant only), EPCAM (Deletion/duplication testing only), FH, FLCN, GATA2, GPC3, GREM1 (Promoter region deletion/duplication testing only), HOXB13 (c.251G>A, p.Gly84Glu), HRAS, KIT, MAX, MEN1, MET, MITF (c.952G>A, p.Glu318Lys variant only), MLH1, MSH2, MSH3, MSH6, MUTYH, NBN, NF1, NF2, NTHL1, PALB2, PDGFRA, PHOX2B, PMS2, POLD1, POLE, POT1, PRKAR1A, PTCH1, PTEN, RAD50, RAD51C, RAD51D, RB1, RECQL4, RET, RUNX1, SDHAF2, SDHA (sequence changes only), SDHB, SDHC, SDHD, SMAD4, SMARCA4, SMARCB1, SMARCE1, STK11, SUFU, TERT, TERT, TMEM127, TP53, TSC1, TSC2, VHL, WRN and WT1.  The report date is August 02, 2017.    08/24/2017 - 11/23/2017 Neo-Adjuvant Chemotherapy    chemotherapy consisting of doxorubicin / cyclophosphamide  in dose dense fashion x4 started 08/24/2017, completed 10/12/2017,  followed by paclitaxel / carboplatin  weekly              (  a) carboplatin /paclitaxel  discontinued after 3 doses, stopped 11/23/2017, due to cytopenias             (b) post chemo MRI 12/04/2017 findings a complete imaging response   12/15/2017 Surgery   status post left lumpectomy and sentinel lymph node sampling showing a complete pathologic response (ypT0, ypN0)   02/07/2018 - 03/04/2018 Radiation Therapy   adjuvant radiation with Dr. Lurena Sally: Left breast 40.05 Gy in 15 fractions, boost: 10 Gy in 5  fractions      REVIEW OF SYSTEMS:   Constitutional: Denies fevers, chills or abnormal weight loss All other systems were reviewed with the patient and are negative. Observations/Objective:     Assessment Plan:  Malignant neoplasm of upper-outer quadrant of left breast in female, estrogen receptor negative (HCC) 09/02/1994: Rt Lumpectomy: DCIS 09/21/1994-11/04/1994: XRT (did not receive Anti estrogens)   07/22/2017: T1c N0 Satge 1B: Grade 3 IDC TNBC Ki 67: 50% 08/02/2017: Genetics Neg 08/24/17-11/23/2017:Neo Adj chemo DD AC x 4, Taxol -carbo X 3 (stopped for cytopenias) 12/15/2017: Left Lumpectomy: Path CR 02/07/2018-03/04/2018: XRT   Breast Cancer Surveillance: 1. Breast exam 04/13/2023: Normal 2. Mammogram 09/01/2022 no abnormalities. Postsurgical changes. Breast Density Category B. 3.  Bone scan 10/02/2022: Bone mets right calvarium, resolution of uptake in the lumbar spine and anterior fifth rib 4.  Bone scan 04/13/2023: Increased uptake in the distal left radius consistent with known subacute fracture.  2 foci in the right calvarium no other suspicious findings. 5.  09/10/2023: CT head: Done for headache: No acute intracranial abnormality 6.  09/02/2023: MRI brain: Unchanged left vestibular schwannoma, enhancing lesion right frontal calvarium and heterogeneously enhancing pituitary gland lesion also unchanged. ------------------------------------------------------------------------------------------------------------------------------------------- Falls due to unsteadiness of gait: Referring patient to Dr. Mark Sil Lab review 10/19/2023: CBC, CMP: Normal, thyroid  panel pending, cortisol 9.7, ACTH  pending At this point there is no clear reason for the unsteadiness of gait other than the vestibular schwannoma. Follow-up in 1 year     I discussed the assessment and treatment plan with the patient. The patient was provided an opportunity to ask questions and all were answered. The patient agreed with  the plan and demonstrated an understanding of the instructions. The patient was advised to call back or seek an in-person evaluation if the symptoms worsen or if the condition fails to improve as anticipated.   I provided 20 minutes of non-face-to-face time during this encounter.  This includes time for charting and coordination of care   Margert Sheerer, MD

## 2023-10-20 NOTE — Assessment & Plan Note (Signed)
 09/02/1994: Rt Lumpectomy: DCIS 09/21/1994-11/04/1994: XRT (did not receive Anti estrogens)   07/22/2017: T1c N0 Satge 1B: Grade 3 IDC TNBC Ki 67: 50% 08/02/2017: Genetics Neg 08/24/17-11/23/2017:Neo Adj chemo DD AC x 4, Taxol -carbo X 3 (stopped for cytopenias) 12/15/2017: Left Lumpectomy: Path CR 02/07/2018-03/04/2018: XRT   Breast Cancer Surveillance: 1. Breast exam 04/13/2023: Normal 2. Mammogram 09/01/2022 no abnormalities. Postsurgical changes. Breast Density Category B. 3.  Bone scan 10/02/2022: Bone mets right calvarium, resolution of uptake in the lumbar spine and anterior fifth rib 4.  Bone scan 04/13/2023: Increased uptake in the distal left radius consistent with known subacute fracture.  2 foci in the right calvarium no other suspicious findings. 5.  09/10/2023: CT head: Done for headache: No acute intracranial abnormality 6.  09/02/2023: MRI brain: Unchanged left vestibular schwannoma, enhancing lesion right frontal calvarium and heterogeneously enhancing pituitary gland lesion also unchanged. ------------------------------------------------------------------------------------------------------------------------------------------- Falls due to unsteadiness of gait: Referring patient to Dr. Mark Sil Lab review 10/19/2023: CBC, CMP: Normal, thyroid  panel pending, cortisol 9.7, ACTH  pending At this point there is no clear reason for the unsteadiness of gait other than the vestibular schwannoma. Follow-up in 1 year

## 2023-10-26 ENCOUNTER — Ambulatory Visit (INDEPENDENT_AMBULATORY_CARE_PROVIDER_SITE_OTHER)

## 2023-10-26 VITALS — Ht 64.0 in | Wt 127.0 lb

## 2023-10-26 DIAGNOSIS — Z1159 Encounter for screening for other viral diseases: Secondary | ICD-10-CM

## 2023-10-26 DIAGNOSIS — Z780 Asymptomatic menopausal state: Secondary | ICD-10-CM | POA: Diagnosis not present

## 2023-10-26 DIAGNOSIS — H9193 Unspecified hearing loss, bilateral: Secondary | ICD-10-CM | POA: Diagnosis not present

## 2023-10-26 DIAGNOSIS — M25631 Stiffness of right wrist, not elsewhere classified: Secondary | ICD-10-CM | POA: Diagnosis not present

## 2023-10-26 DIAGNOSIS — Z0000 Encounter for general adult medical examination without abnormal findings: Secondary | ICD-10-CM | POA: Diagnosis not present

## 2023-10-26 NOTE — Progress Notes (Signed)
 Subjective:   Stephanie Bautista is a 73 y.o. who presents for a Medicare Wellness preventive visit.  As a reminder, Annual Wellness Visits don't include a physical exam, and some assessments may be limited, especially if this visit is performed virtually. We may recommend an in-person follow-up visit with your provider if needed.  Visit Complete: Virtual I connected with  Stephanie Bautista on 10/26/23 by a audio enabled telemedicine application and verified that I am speaking with the correct person using two identifiers.  Patient Location: Home  Provider Location: Office/Clinic  I discussed the limitations of evaluation and management by telemedicine. The patient expressed understanding and agreed to proceed.  Vital Signs: Because this visit was a virtual/telehealth visit, some criteria may be missing or patient reported. Any vitals not documented were not able to be obtained and vitals that have been documented are patient reported.  VideoDeclined- This patient declined Librarian, academic. Therefore the visit was completed with audio only.  Persons Participating in Visit: Patient.  AWV Questionnaire: No: Patient Medicare AWV questionnaire was not completed prior to this visit.  Cardiac Risk Factors include: advanced age (>61men, >65 women);hypertension     Objective:     Today's Vitals   10/26/23 1310  Weight: 127 lb (57.6 kg)  Height: 5\' 4"  (1.626 m)   Body mass index is 21.8 kg/m.     10/26/2023    1:10 PM 09/10/2023   10:35 AM 02/08/2023   12:14 PM 10/06/2022   11:28 AM 07/03/2022    5:53 AM 03/31/2022    9:26 AM 09/24/2021   11:39 AM  Advanced Directives  Does Patient Have a Medical Advance Directive? No No No No No No No  Would patient like information on creating a medical advance directive? Yes (MAU/Ambulatory/Procedural Areas - Information given) No - Patient declined  No - Patient declined No - Patient declined No - Patient declined No -  Patient declined    Current Medications (verified) Outpatient Encounter Medications as of 10/26/2023  Medication Sig   ascorbic acid (VITAMIN C) 500 MG tablet Take 500 mg by mouth daily.   BIOTIN 5000 5 MG CAPS Take by mouth.   Inulin-Calcium-Vitamin D 2-250-100 GM-MG-UNIT CHEW Chew by mouth daily.   meclizine  (ANTIVERT ) 12.5 MG tablet Take 1 tablet (12.5 mg total) by mouth 3 (three) times daily as needed for dizziness.   Multiple Vitamin (MULTI-VITAMIN) tablet Take 1 tablet by mouth daily.   Polyethylene Glycol 3350  (MIRALAX  PO) Take by mouth.   senna-docusate (SENOKOT-S) 8.6-50 MG tablet Take 1 tablet by mouth daily.   amLODipine  (NORVASC ) 5 MG tablet Take 1.5 tablets (7.5 mg total) by mouth daily.   No facility-administered encounter medications on file as of 10/26/2023.    Allergies (verified) Amoxicillin   History: Past Medical History:  Diagnosis Date   Acoustic neuroma (HCC)    Adenomatous colon polyp 12/2002   Arthritis    Breast cancer (HCC) 1996   right breast cancer in 1996, left breast cancer in 2019   Cancer (HCC)    Cholelithiases    Esophageal stricture    Family history of breast cancer    Family history of ovarian cancer    Family history of pancreatic cancer    Family history of prostate cancer    GERD (gastroesophageal reflux disease)    Hemorrhoids    Hiatal hernia    History of colon polyps    History of radiation therapy 02/07/18-03/04/2018   Left Breast/  40.05 Gy in 15 fractions, Left Breast boost/ 10 Gy in 5 fractions.    Inguinal hernia    UTI (lower urinary tract infection)    Past Surgical History:  Procedure Laterality Date   accoustic neuroma     BREAST LUMPECTOMY     BREAST LUMPECTOMY WITH RADIOACTIVE SEED AND SENTINEL LYMPH NODE BIOPSY Left 12/15/2017   Procedure: LEFT BREAST LUMPECTOMY WITH RADIOACTIVE SEED AND LEFT SENTINEL LYMPH NODE BIOPSY, LEFT BREAST SEED GUIDED EXCISIONAL BIOPSY;  Surgeon: Enid Harry, MD;  Location: [facility];  Service: General;  Laterality: Left;   CESAREAN SECTION     2x   CHOLECYSTECTOMY  2008   HEMORRHOID SURGERY  2007   HEMORRHOID SURGERY N/A 07/03/2022   Procedure: HEMORRHOIDECTOMY;  Surgeon: Melvenia Stabs, MD;  Location: [facility];  Service: General;  Laterality: N/A;   INGUINAL HERNIA REPAIR  1968   PARTIAL HYSTERECTOMY  1996   PORT-A-CATH REMOVAL Right 12/15/2017   Procedure: REMOVAL PORT-A-CATH;  Surgeon: Enid Harry, MD;  Location: [facility];  Service: General;  Laterality: Right;   PORTACATH PLACEMENT Right 08/05/2017   Procedure: INSERTION PORT-A-CATH WITH US ;  Surgeon: Enid Harry, MD;  Location: [facility];  Service: General;  Laterality: Right;   RECTAL EXAM UNDER ANESTHESIA N/A 07/03/2022   Procedure: RECTAL EXAM UNDER ANESTHESIA;  Surgeon: Melvenia Stabs, MD;  Location: [facility];  Service: General;  Laterality: N/A;   SHOULDER SURGERY Right 2005   torn muscle   TONSILLECTOMY     Family History  Problem Relation Age of Onset   Heart disease Mother    Melanoma Mother        dx in her 6s   Ovarian cancer Mother 36   Heart disease Father    Diabetes Father    Stroke Father    Ovarian cancer Sister 73   Non-Hodgkin's lymphoma Maternal Grandmother    Heart disease Maternal Grandfather    Heart disease Paternal Grandmother    Prostate cancer Paternal Grandfather    Pancreatic cancer Maternal Aunt    Cancer Maternal Aunt        NOS   Bone cancer Maternal Uncle    Stroke Maternal Uncle    Bone cancer Maternal Uncle    Breast cancer Paternal Aunt    Breast cancer Cousin        daughter of mat aunt with NOS cancer   Colon cancer Neg Hx    Esophageal cancer Neg Hx    Stomach cancer Neg Hx    Social History   Socioeconomic History   Marital status: Married    Spouse name: Not on file   Number of children: 2   Years of education: Not on file   Highest education  level: 12th grade  Occupational History   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, political party   Occupation: retired  Tobacco Use   Smoking status: Never   Smokeless tobacco: Never  Vaping Use   Vaping status: Never Used  Substance and Sexual Activity   Alcohol use: No   Drug use: No   Sexual activity: Not Currently    Birth control/protection: Surgical, Post-menopausal  Other Topics Concern   Not on file  Social History Narrative   Married   Social Drivers of Health   Financial Resource Strain: Low Risk  (10/26/2023)   Overall Financial Resource Strain (CARDIA)    Difficulty of Paying Living Expenses: Not hard  at all  Food Insecurity: No Food Insecurity (10/26/2023)   Hunger Vital Sign    Worried About Running Out of Food in the Last Year: Never true    Ran Out of Food in the Last Year: Never true  Transportation Needs: No Transportation Needs (10/26/2023)   PRAPARE - Administrator, Civil Service (Medical): No    Lack of Transportation (Non-Medical): No  Physical Activity: Sufficiently Active (10/26/2023)   Exercise Vital Sign    Days of Exercise per Week: 6 days    Minutes of Exercise per Session: 120 min  Stress: Stress Concern Present (10/26/2023)   [employer] of Occupational Health - Occupational Stress Questionnaire    Feeling of Stress : To some extent  Social Connections: Socially Integrated (10/26/2023)   Social Connection and Isolation Panel [NHANES]    Frequency of Communication with Friends and Family: Three times a week    Frequency of Social Gatherings with Friends and Family: Once a week    Attends Religious Services: More than 4 times per year    Active Member of [employer] or Organizations: Yes    Attends Engineer, structural: More than 4 times per year    Marital Status: Married    Tobacco Counseling Counseling given: No    Clinical Intake:  Pre-visit preparation completed: Yes  Pain : No/denies pain     BMI - recorded:  21.8 Nutritional Status: BMI of 19-24  Normal Nutritional Risks: None Diabetes: No  No results found for: "HGBA1C"   How often do you need to have someone help you when you read instructions, pamphlets, or other written materials from your doctor or pharmacy?: 1 - Never  Interpreter Needed?: No  Information entered by :: Kandy Orris, CMA   Activities of Daily Living     10/26/2023    1:14 PM  In your present state of health, do you have any difficulty performing the following activities:  Hearing? 1  Comment referral to an Audiologist for an exam  Vision? 0  Difficulty concentrating or making decisions? 0  Walking or climbing stairs? 0  Dressing or bathing? 0  Doing errands, shopping? 0  Preparing Food and eating ? N  Using the Toilet? N  In the past six months, have you accidently leaked urine? N  Do you have problems with loss of bowel control? N  Managing your Medications? N  Managing your Finances? N  Housekeeping or managing your Housekeeping? N    Patient Care Team: Colene Dauphin, MD as PCP - General (Internal Medicine) Magrinat, Rozella Cornfield, MD (Inactive) as Consulting Physician (Oncology) Enid Harry, MD as Consulting Physician (General Surgery) Leontine Rana, MD (Inactive) as Consulting Physician (Obstetrics and Gynecology) Gordy Lauber, MD as Consulting Physician (Endocrinology) Shermon Divine, MD as Consulting Physician (Sports Medicine) Hilarie Lovely, MD as Referring Physician (Otolaryngology) Colie Dawes, MD as Attending Physician (Radiation Oncology) Arcelia Bean, MD as Referring Physician (Radiology) Thurmon Florida., MD (Ophthalmology)  I have updated your Care Teams any recent Medical Services you may have received from other providers in the past year.     Assessment:    This is a routine wellness examination for Stephanie Bautista.  Hearing/Vision screen Hearing Screening - Comments:: Referral to an Audiologist  Vision Screening -  Comments:: Wears rx glasses - appt w/Dr Junette Older   Goals Addressed               This Visit's Progress  Patient Stated (pt-stated)        Patient stated staying active is important to keep doing       Depression Screen     10/26/2023    1:18 PM 09/22/2023   11:26 AM 05/18/2015   11:10 AM 12/27/2014    9:08 AM  PHQ 2/9 Scores  PHQ - 2 Score 0 0 0 0  PHQ- 9 Score 0       Fall Risk     10/26/2023    1:15 PM 09/22/2023   11:26 AM  Fall Risk   Falls in the past year? 1 0  Number falls in past yr: 0 0  Comment fractured 2 wrists in 2024   Injury with Fall? 1 0  Risk for fall due to : Impaired balance/gait No Fall Risks  Follow up Falls evaluation completed;Falls prevention discussed Falls evaluation completed    MEDICARE RISK AT HOME:  Medicare Risk at Home Any stairs in or around the home?: Yes If so, are there any without handrails?: Yes Home free of loose throw rugs in walkways, pet beds, electrical cords, etc?: Yes Adequate lighting in your home to reduce risk of falls?: Yes Life alert?: No Use of a cane, walker or w/c?: No Grab bars in the bathroom?: No Shower chair or bench in shower?: No Elevated toilet seat or a handicapped toilet?: Yes  TIMED UP AND GO:  Was the test performed?  No  Cognitive Function: 6CIT completed        10/26/2023    1:18 PM  6CIT Screen  What Year? 0 points  What month? 0 points  What time? 0 points  Count back from 20 0 points  Months in reverse 0 points  Repeat phrase 0 points  Total Score 0 points    Immunizations Immunization History  Administered Date(s) Administered   PFIZER(Purple Top)SARS-COV-2 Vaccination 06/19/2019, 07/10/2019    Screening Tests Health Maintenance  Topic Date Due   Hepatitis C Screening  Never done   DTaP/Tdap/Td (1 - Tdap) Never done   Pneumonia Vaccine 74+ Years old (1 of 2 - PCV) Never done   Zoster Vaccines- Shingrix (1 of 2) Never done   DEXA SCAN  06/05/2015   COVID-19 Vaccine  (3 - Pfizer risk series) 08/07/2019   INFLUENZA VACCINE  12/24/2023   MAMMOGRAM  08/31/2024   Medicare Annual Wellness (AWV)  10/25/2024   Colonoscopy  06/11/2025   HPV VACCINES  Aged Out   Meningococcal B Vaccine  Aged Out    Health Maintenance  Health Maintenance Due  Topic Date Due   Hepatitis C Screening  Never done   DTaP/Tdap/Td (1 - Tdap) Never done   Pneumonia Vaccine 82+ Years old (1 of 2 - PCV) Never done   Zoster Vaccines- Shingrix (1 of 2) Never done   DEXA SCAN  06/05/2015   COVID-19 Vaccine (3 - Pfizer risk series) 08/07/2019   Health Maintenance Items Addressed:  DEXA ordered, Labs Ordered: Hepatitis C Screening test, and a referral to an Audiologist due to difficulty hearing  Additional Screening:  Vision Screening: Recommended annual ophthalmology exams for early detection of glaucoma and other disorders of the eye. Pt stated has an appt w/Dr Junette Older on 10/27/2023 for routine annual eye exam.   Dental Screening: Recommended annual dental exams for proper oral hygiene  Community Resource Referral / Chronic Care Management: CRR required this visit?  No   CCM required this visit?  No   Plan:  I have personally reviewed and noted the following in the patient's chart:   Medical and social history Use of alcohol, tobacco or illicit drugs  Current medications and supplements including opioid prescriptions. Patient is not currently taking opioid prescriptions. Functional ability and status Nutritional status Physical activity Advanced directives List of other physicians Hospitalizations, surgeries, and ER visits in previous 12 months Vitals Screenings to include cognitive, depression, and falls Referrals and appointments  In addition, I have reviewed and discussed with patient certain preventive protocols, quality metrics, and best practice recommendations. A written personalized care plan for preventive services as well as general preventive health  recommendations were provided to patient.   Patria Bookbinder, CMA   10/26/2023   After Visit Summary: (MyChart) Due to this being a telephonic visit, the after visit summary with patients personalized plan was offered to patient via MyChart   Notes: Nothing significant to report at this time.

## 2023-10-26 NOTE — Patient Instructions (Signed)
 Stephanie Bautista , Thank you for taking time out of your busy schedule to complete your Annual Wellness Visit with me. I enjoyed our conversation and look forward to speaking with you again next year. I, as well as your care team,  appreciate your ongoing commitment to your health goals. Please review the following plan we discussed and let me know if I can assist you in the future. Your Game plan/ To Do List    Referrals: If you haven't heard from the office you've been referred to, please reach out to them at the phone provided.  Referral to an Audiologist; Ordered a Hepatitis C Screening test and a DEXA scan. Follow up Visits: Next Medicare AWV with our clinical staff: 10/30/2024   Have you seen your provider in the last 6 months (3 months if uncontrolled diabetes)? Yes Next Office Visit with your provider: 02/2024  Clinician Recommendations:  Aim for 30 minutes of exercise or brisk walking, 6-8 glasses of water, and 5 servings of fruits and vegetables each day. Educated and advised on getting the Shingles, Pneumonia, Tdap (Tetenus), and COVID vaccines at local pharmacy in 2025.      This is a list of the screening recommended for you and due dates:  Health Maintenance  Topic Date Due   Hepatitis C Screening  Never done   DTaP/Tdap/Td vaccine (1 - Tdap) Never done   Pneumonia Vaccine (1 of 2 - PCV) Never done   Zoster (Shingles) Vaccine (1 of 2) Never done   DEXA scan (bone density measurement)  06/05/2015   COVID-19 Vaccine (3 - Pfizer risk series) 08/07/2019   Flu Shot  12/24/2023   Mammogram  08/31/2024   Medicare Annual Wellness Visit  10/25/2024   Colon Cancer Screening  06/11/2025   HPV Vaccine  Aged Out   Meningitis B Vaccine  Aged Out    Advanced directives: (Provided) Advance directive discussed with you today. I have provided a copy for you to complete at home and have notarized. Once this is complete, please bring a copy in to our office so we can scan it into your chart.   Advance Care Planning is important because it:  [x]  Makes sure you receive the medical care that is consistent with your values, goals, and preferences  [x]  It provides guidance to your family and loved ones and reduces their decisional burden about whether or not they are making the right decisions based on your wishes.  Follow the link provided in your after visit summary or read over the paperwork we have mailed to you to help you started getting your Advance Directives in place. If you need assistance in completing these, please reach out to us  so that we can help you!

## 2023-10-27 DIAGNOSIS — H2513 Age-related nuclear cataract, bilateral: Secondary | ICD-10-CM | POA: Diagnosis not present

## 2023-10-28 DIAGNOSIS — M25631 Stiffness of right wrist, not elsewhere classified: Secondary | ICD-10-CM | POA: Diagnosis not present

## 2023-11-01 ENCOUNTER — Inpatient Hospital Stay: Attending: Hematology and Oncology | Admitting: Internal Medicine

## 2023-11-01 VITALS — BP 130/66 | HR 76 | Temp 98.2°F | Resp 14 | Ht 64.0 in | Wt 127.7 lb

## 2023-11-01 DIAGNOSIS — Z807 Family history of other malignant neoplasms of lymphoid, hematopoietic and related tissues: Secondary | ICD-10-CM | POA: Insufficient documentation

## 2023-11-01 DIAGNOSIS — D333 Benign neoplasm of cranial nerves: Secondary | ICD-10-CM | POA: Diagnosis not present

## 2023-11-01 DIAGNOSIS — Z800 Family history of malignant neoplasm of digestive organs: Secondary | ICD-10-CM | POA: Diagnosis not present

## 2023-11-01 DIAGNOSIS — Z808 Family history of malignant neoplasm of other organs or systems: Secondary | ICD-10-CM | POA: Diagnosis not present

## 2023-11-01 DIAGNOSIS — Z809 Family history of malignant neoplasm, unspecified: Secondary | ICD-10-CM | POA: Insufficient documentation

## 2023-11-01 DIAGNOSIS — Z8042 Family history of malignant neoplasm of prostate: Secondary | ICD-10-CM | POA: Insufficient documentation

## 2023-11-01 DIAGNOSIS — Z79899 Other long term (current) drug therapy: Secondary | ICD-10-CM | POA: Diagnosis not present

## 2023-11-01 DIAGNOSIS — C50412 Malignant neoplasm of upper-outer quadrant of left female breast: Secondary | ICD-10-CM | POA: Diagnosis not present

## 2023-11-01 DIAGNOSIS — Z8041 Family history of malignant neoplasm of ovary: Secondary | ICD-10-CM | POA: Diagnosis not present

## 2023-11-01 DIAGNOSIS — C7951 Secondary malignant neoplasm of bone: Secondary | ICD-10-CM | POA: Insufficient documentation

## 2023-11-01 DIAGNOSIS — Z803 Family history of malignant neoplasm of breast: Secondary | ICD-10-CM | POA: Diagnosis not present

## 2023-11-01 DIAGNOSIS — Z171 Estrogen receptor negative status [ER-]: Secondary | ICD-10-CM | POA: Diagnosis not present

## 2023-11-01 MED ORDER — DEXAMETHASONE 2 MG PO TABS: 4.0000 mg | ORAL_TABLET | Freq: Every day | ORAL | 0 refills | Status: DC

## 2023-11-01 NOTE — Progress Notes (Signed)
 [facility] [address]  [place], [place] [address] [phone]   New Patient Evaluation  Date of Service: 11/01/23 Patient Name: Stephanie Bautista Patient MRN: [id number] Patient DOB: 31-Dec-1950 Provider: Mamie Searles, MD  Identifying Statement:  Stephanie Bautista is a 73 y.o. female with Schwannoma of cranial nerve (HCC), left who Bautista for initial consultation and evaluation regarding cancer associated neurologic deficits.    Referring Provider: Cameron Cea, MD [address] [place],  [place] [address]  Primary Cancer:  Oncologic History: Oncology History  Malignant neoplasm of upper-outer quadrant of left breast in female, estrogen receptor negative (HCC)  09/02/1994 Surgery   status post right lumpectomy 09/02/1994 for ductal carcinoma in situ             (a) status post adjuvant radiation 09/21/1994 through 11/04/1994, total 5040 centigrade to the breast and 604 0 cGy to the tumor bed             (b) did not receive adjuvant antiestrogens   07/22/2017 Initial Biopsy    status post left breast upper outer quadrant biopsy for a clinical T1c pN0, stage IB invasive ductal carcinoma, grade 3, triple negative, with an MIB-1 of 50%.             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  The Multi-Gene Panel offered by [facility] includes sequencing and/or deletion duplication testing of the following 83 genes: ALK, APC, ATM, AXIN2,BAP1,  BARD1, BLM, BMPR1A, BRCA1, BRCA2, BRIP1, CASR, CDC73, CDH1, CDK4, CDKN1B, CDKN1C, CDKN2A (p14ARF), CDKN2A  (p16INK4a), CEBPA, CHEK2, CTNNA1, DICER1, DIS3L2, EGFR (c.2369C>T, p.Thr790Met variant only), EPCAM (Deletion/duplication testing only), FH, FLCN, GATA2, GPC3, GREM1 (Promoter region deletion/duplication testing only), HOXB13 (c.251G>A, p.Gly84Glu), HRAS, KIT, MAX, MEN1, MET, MITF (c.952G>A, p.Glu318Lys variant only), MLH1, MSH2, MSH3, MSH6, MUTYH, NBN, NF1, NF2, NTHL1, PALB2, PDGFRA, PHOX2B, PMS2, POLD1, POLE, POT1, PRKAR1A, PTCH1, PTEN, RAD50, RAD51C, RAD51D, RB1, RECQL4, RET, RUNX1, SDHAF2, SDHA (sequence changes only), SDHB, SDHC, SDHD, SMAD4, SMARCA4, SMARCB1, SMARCE1, STK11, SUFU, TERT, TERT, TMEM127, TP53, TSC1, TSC2, VHL, WRN and WT1.  The report date is August 02, 2017.    08/24/2017 - 11/23/2017 Neo-Adjuvant Chemotherapy    chemotherapy consisting of doxorubicin / cyclophosphamide  in dose dense fashion x4 started 08/24/2017, completed 10/12/2017,  followed by paclitaxel / carboplatin  weekly              (a) carboplatin /paclitaxel  discontinued after 3 doses, stopped 11/23/2017, due to cytopenias             (b) post chemo MRI 12/04/2017 findings a complete imaging response   12/15/2017 Surgery   status post left lumpectomy and sentinel lymph node sampling showing a complete pathologic response (ypT0, ypN0)   02/07/2018 - 03/04/2018 Radiation Therapy   adjuvant radiation with Dr. Lurena Sally: Left breast 40.05 Gy in 15 fractions, boost: 10 Gy in 5 fractions     CNS Oncologic History 08/24/18: Undergoes SRS  for progressive L vestibular schwannoma at [facility])  History of Present Illness: The patient's records from the referring physician were obtained and reviewed and the patient interviewed to confirm this HPI.  Stephanie Bautista today for evaluation of dizziness, unsteady gait.  She and her husband describe sudden onset of imbalance, undsteady gait without frank vertigo on 09/08/23.  This was preceded by "severe headache, like a migraine" which is unusual for her.  She did improve with her gait  and balance over the next few days, but without return to prior baseline.  Even now almost two months later, she stays away from driving and is walking often with a cane.  Prior she was driving independently and walking multiple miles per day.  She has vestibular schwannoma that was treated at [facility] in 2020 with radiosurgery.  She continues to have hearing impairment in the left ear, follows with audiology.  Also has had both wrists fractured in past year, bilateral cataracts.  Medications: Current Outpatient Medications on File Prior to Visit  Medication Sig Dispense Refill   ascorbic acid (VITAMIN C) 500 MG tablet Take 500 mg by mouth daily.     Inulin-Calcium-Vitamin D 2-250-100 GM-MG-UNIT CHEW Chew by mouth daily.     Multiple Vitamin (MULTI-VITAMIN) tablet Take 1 tablet by mouth daily.     Polyethylene Glycol 3350  (MIRALAX  PO) Take by mouth.     amLODipine  (NORVASC ) 5 MG tablet Take 1.5 tablets (7.5 mg total) by mouth daily. 45 tablet 5   BIOTIN 5000 5 MG CAPS Take by mouth. (Patient not taking: Reported on 11/01/2023)     meclizine  (ANTIVERT ) 12.5 MG tablet Take 1 tablet (12.5 mg total) by mouth 3 (three) times daily as needed for dizziness. (Patient not taking: Reported on 11/01/2023) 30 tablet 0   senna-docusate (SENOKOT-S) 8.6-50 MG tablet Take 1 tablet by mouth daily. (Patient not taking: Reported on 11/01/2023) 30 tablet 0   No current facility-administered medications on file prior to visit.    Allergies:  Allergies  Allergen Reactions   Amoxicillin Itching, Rash and Nausea Only   Past Medical History:  Past Medical History:  Diagnosis Date   Acoustic neuroma (HCC)    Adenomatous colon polyp 12/2002   Arthritis    Breast cancer (HCC) 1996   right breast cancer in 1996, left breast cancer in 2019   Cancer (HCC)    Cholelithiases    Esophageal stricture    Family history of breast cancer    Family history of ovarian cancer    Family history of pancreatic cancer    Family  history of prostate cancer    GERD (gastroesophageal reflux disease)    Hemorrhoids    Hiatal hernia    History of colon polyps    History of radiation therapy 02/07/18-03/04/2018   Left Breast/ 40.05 Gy in 15 fractions, Left Breast boost/ 10 Gy in 5 fractions.    Inguinal hernia    UTI (lower urinary tract infection)    Past Surgical History:  Past Surgical History:  Procedure Laterality Date   accoustic neuroma     BREAST LUMPECTOMY     BREAST LUMPECTOMY WITH RADIOACTIVE SEED AND SENTINEL LYMPH NODE BIOPSY Left 12/15/2017   Procedure: LEFT BREAST LUMPECTOMY WITH RADIOACTIVE SEED AND LEFT SENTINEL LYMPH NODE BIOPSY, LEFT BREAST SEED GUIDED EXCISIONAL BIOPSY;  Surgeon: Enid Harry, MD;  Location: [facility];  Service: General;  Laterality: Left;   CESAREAN SECTION     2x  CHOLECYSTECTOMY  2008   HEMORRHOID SURGERY  2007   HEMORRHOID SURGERY N/A 07/03/2022   Procedure: HEMORRHOIDECTOMY;  Surgeon: Melvenia Stabs, MD;  Location: [facility];  Service: General;  Laterality: N/A;   INGUINAL HERNIA REPAIR  1968   PARTIAL HYSTERECTOMY  1996   PORT-A-CATH REMOVAL Right 12/15/2017   Procedure: REMOVAL PORT-A-CATH;  Surgeon: Enid Harry, MD;  Location: [facility];  Service: General;  Laterality: Right;   PORTACATH PLACEMENT Right 08/05/2017   Procedure: INSERTION PORT-A-CATH WITH US ;  Surgeon: Enid Harry, MD;  Location: [facility];  Service: General;  Laterality: Right;   RECTAL EXAM UNDER ANESTHESIA N/A 07/03/2022   Procedure: RECTAL EXAM UNDER ANESTHESIA;  Surgeon: Melvenia Stabs, MD;  Location: [facility];  Service: General;  Laterality: N/A;   SHOULDER SURGERY Right 2005   torn muscle   TONSILLECTOMY     Social History:  Social History   Socioeconomic History   Marital status: Married    Spouse name: Not on file   Number of children: 2   Years of education: Not on file    Highest education level: 12th grade  Occupational History   Occupation: office    Employer: [employer]   Occupation: retired  Tobacco Use   Smoking status: Never   Smokeless tobacco: Never  Vaping Use   Vaping status: Never Used  Substance and Sexual Activity   Alcohol use: No   Drug use: No   Sexual activity: Not Currently    Birth control/protection: Surgical, Post-menopausal  Other Topics Concern   Not on file  Social History Narrative   Married   Social Drivers of Health   Financial Resource Strain: Low Risk  (10/26/2023)   Overall Financial Resource Strain (CARDIA)    Difficulty of Paying Living Expenses: Not hard at all  Food Insecurity: No Food Insecurity (10/26/2023)   Hunger Vital Sign    Worried About Running Out of Food in the Last Year: Never true    Ran Out of Food in the Last Year: Never true  Transportation Needs: No Transportation Needs (10/26/2023)   PRAPARE - Administrator, Civil Service (Medical): No    Lack of Transportation (Non-Medical): No  Physical Activity: Sufficiently Active (10/26/2023)   Exercise Vital Sign    Days of Exercise per Week: 6 days    Minutes of Exercise per Session: 120 min  Stress: Stress Concern Present (10/26/2023)   [employer] of Occupational Health - Occupational Stress Questionnaire    Feeling of Stress : To some extent  Social Connections: Socially Integrated (10/26/2023)   Social Connection and Isolation Panel [NHANES]    Frequency of Communication with Friends and Family: Three times a week    Frequency of Social Gatherings with Friends and Family: Once a week    Attends Religious Services: More than 4 times per year    Active Member of [employer] or Organizations: Yes    Attends Engineer, structural: More than 4 times per year    Marital Status: Married  Catering manager Violence: Not At Risk (10/26/2023)   Humiliation, Afraid, Rape, and Kick questionnaire    Fear of Current or Ex-Partner: No     Emotionally Abused: No    Physically Abused: No    Sexually Abused: No   Family History:  Family History  Problem Relation Age of Onset   Heart disease Mother    Melanoma Mother  dx in her 83s   Ovarian cancer Mother 65   Heart disease Father    Diabetes Father    Stroke Father    Ovarian cancer Sister 59   Non-Hodgkin's lymphoma Maternal Grandmother    Heart disease Maternal Grandfather    Heart disease Paternal Grandmother    Prostate cancer Paternal Grandfather    Pancreatic cancer Maternal Aunt    Cancer Maternal Aunt        NOS   Bone cancer Maternal Uncle    Stroke Maternal Uncle    Bone cancer Maternal Uncle    Breast cancer Paternal Aunt    Breast cancer Cousin        daughter of mat aunt with NOS cancer   Colon cancer Neg Hx    Esophageal cancer Neg Hx    Stomach cancer Neg Hx     Review of Systems: Constitutional: Doesn't report fevers, chills or abnormal weight loss Eyes: Doesn't report blurriness of vision Ears, nose, mouth, throat, and face: Doesn't report sore throat Respiratory: Doesn't report cough, dyspnea or wheezes Cardiovascular: Doesn't report palpitation, chest discomfort  Gastrointestinal:  Doesn't report nausea, constipation, diarrhea GU: Doesn't report incontinence Skin: Doesn't report skin rashes Neurological: Per HPI Musculoskeletal: Doesn't report joint pain Behavioral/Psych: Doesn't report anxiety  Physical Exam: Vitals:   11/01/23 1355  BP: 130/66  Pulse: 76  Resp: 14  Temp: 98.2 F (36.8 C)  SpO2: 98%   KPS: 80. General: Alert, cooperative, pleasant, in no acute distress Head: Normal EENT: No conjunctival injection or scleral icterus.  Lungs: Resp effort normal Cardiac: Regular rate Abdomen: Non-distended abdomen Skin: No rashes cyanosis or petechiae. Extremities: No clubbing or edema  Neurologic Exam: Mental Status: Awake, alert, attentive to examiner. Oriented to self and environment. Language is fluent with  intact comprehension.  Cranial Nerves: Visual acuity is grossly normal. Visual fields are full. Extra-ocular movements intact. No ptosis. Face is symmetric Motor: Tone and bulk are normal. Power is full in both arms and legs. Reflexes are symmetric, no pathologic reflexes present.  Sensory: Intact to light touch Gait: Dystaxic   Labs: I have reviewed the data as listed    Component Value Date/Time   NA 141 10/19/2023 0846   K 3.8 10/19/2023 0846   CL 107 10/19/2023 0846   CO2 27 10/19/2023 0846   GLUCOSE 93 10/19/2023 0846   BUN 11 10/19/2023 0846   CREATININE 0.73 10/19/2023 0846   CREATININE 0.69 11/16/2011 1054   CALCIUM 9.7 10/19/2023 0846   PROT 7.2 10/19/2023 0846   ALBUMIN 4.5 10/19/2023 0846   AST 14 (L) 10/19/2023 0846   ALT 7 10/19/2023 0846   ALKPHOS 57 10/19/2023 0846   BILITOT 0.7 10/19/2023 0846   GFRNONAA >60 10/19/2023 0846   GFRAA >60 08/14/2019 0918   GFRAA >60 07/27/2017 1007   Lab Results  Component Value Date   WBC 4.6 10/19/2023   NEUTROABS 3.0 10/19/2023   HGB 14.1 10/19/2023   HCT 41.3 10/19/2023   MCV 85.5 10/19/2023   PLT 192 10/19/2023    Imaging:  1.  Unchanged size and appearance of a left vestibular schwannoma. 2.  The additional findings of an enhancing lesion in the right frontal calvarium and a heterogeneously enhancing pituitary lesion also appear unchanged. Narrative  MR TEMPORAL BONE/IAC (HI-RES) WWO CONTRAST, 09/02/2023 11:40 AM  INDICATION: vestibular evaluation, Benign neoplasm of cranial nerves (CMD) \ D33.3 Benign neoplasm of cranial nerves (CMD) COMPARISON: Temporal bone MR 09/03/2022  TECHNIQUE: Multiplanar multisequence  MR imaging of the brain was obtained before and after intravenous administration of gadolinium-based contrast, including axial and coronal thin-section images of the petrous temporal bones. By design, the brain was incompletely imaged.   FINDINGS:  Skull base/soft tissues: Unchanged T1 hypointense,  enhancing lesion in the right frontal calvarium. Mastoid air cells and middle ears are normally aerated bilaterally.  Brainstem/cerebellum: No cerebellopontine angle mass.  Internal auditory canals/inner ears: Unchanged size and appearance of a heterogeneously enhancing lesion in the left internal auditory canal and cerebellopontine angle cistern, which measures 13 x 9 mm in transaxial dimensions (series 5, image 82). No new enhancing lesions identified.  Imaged brain: No evidence of acute infarct. No acute hemorrhage, mass effect, or hydrocephalus. Unchanged size and appearance of a T2 hyperintense, heterogeneously enhancing sellar lesion. Procedure Note  Manual Self, MD - 09/05/2023 Formatting of this note might be different from the original. MR TEMPORAL BONE/IAC (HI-RES) WWO CONTRAST, 09/02/2023 11:40 AM  INDICATION: vestibular evaluation, Benign neoplasm of cranial nerves (CMD) \ D33.3 Benign neoplasm of cranial nerves (CMD) COMPARISON: Temporal bone MR 09/03/2022  TECHNIQUE: Multiplanar multisequence MR imaging of the brain was obtained before and after intravenous administration of gadolinium-based contrast, including axial and coronal thin-section images of the petrous temporal bones. By design, the brain was incompletely imaged.   FINDINGS:  Skull base/soft tissues: Unchanged T1 hypointense, enhancing lesion in the right frontal calvarium. Mastoid air cells and middle ears are normally aerated bilaterally.  Brainstem/cerebellum: No cerebellopontine angle mass.  Internal auditory canals/inner ears: Unchanged size and appearance of a heterogeneously enhancing lesion in the left internal auditory canal and cerebellopontine angle cistern, which measures 13 x 9 mm in transaxial dimensions (series 5, image 82). No new enhancing lesions identified.  Imaged brain: No evidence of acute infarct. No acute hemorrhage, mass effect, or hydrocephalus. Unchanged size and appearance of a T2  hyperintense, heterogeneously enhancing sellar lesion.   IMPRESSION:  1.  Unchanged size and appearance of a left vestibular schwannoma. 2.  The additional findings of an enhancing lesion in the right frontal calvarium and a heterogeneously enhancing pituitary lesion also appear unchanged. Exam End: 09/02/23 11:40   Specimen Collected: 09/05/23 12:50     Assessment/Plan Schwannoma of cranial nerve [facility]), left  Stephanie Bautista Bautista with clinical syndrome of dystaxia.  Etiology is unclear but may be multifactorial; known treated schwannoma, visual impairment, psychogenic unsteadiness.    Given acute onset and lack of full recovery over 2 months, as well as noted elevated blood pressure during presentation, there is some suspicion for posterior fossa infarct.  She only obtained a CT head on 4/18, last MRI was back on 09/02/23 at [facility].    Other possibility is inflammation, such as vestibular neuritis or delayed radiation treatment effect.  This would have also been likely visible on an MRI.  Discussed and recommended trial of dexamethasone  4mg  daily x7 days.  If inflammatory etiology, we would expect to see some improvement.  If no improvement, stroke will be deemed more likely.  Would then consider repeat MRI brain and referral to neuro-PT for gait training.  We will touch base with her via phone in 1 week to assess response to decadron .  She is agreeable with this.  We spent twenty additional minutes teaching regarding the natural history, biology, and historical experience in the treatment of neurologic complications of cancer.   We appreciate the opportunity to participate in the care of Stephanie Bautista.   All questions were answered. The  patient knows to call the clinic with any problems, questions or concerns. No barriers to learning were detected.  The total time spent in the encounter was 40 minutes and more than 50% was on counseling and review of test results   Mamie Searles, MD Medical Director of Neuro-Oncology [facility] 11/01/23 2:05 PM

## 2023-11-02 DIAGNOSIS — M25631 Stiffness of right wrist, not elsewhere classified: Secondary | ICD-10-CM | POA: Diagnosis not present

## 2023-11-08 ENCOUNTER — Other Ambulatory Visit: Payer: Self-pay | Admitting: *Deleted

## 2023-11-08 ENCOUNTER — Inpatient Hospital Stay (HOSPITAL_BASED_OUTPATIENT_CLINIC_OR_DEPARTMENT_OTHER): Admitting: Internal Medicine

## 2023-11-08 DIAGNOSIS — R42 Dizziness and giddiness: Secondary | ICD-10-CM | POA: Diagnosis not present

## 2023-11-08 DIAGNOSIS — D333 Benign neoplasm of cranial nerves: Secondary | ICD-10-CM

## 2023-11-08 MED ORDER — DEXAMETHASONE 1 MG PO TABS: ORAL_TABLET | ORAL | 0 refills | Status: AC

## 2023-11-08 NOTE — Progress Notes (Signed)
 I connected with Stephanie Bautista on 11/08/23 at  3:30 PM EDT by telephone visit and verified that I am speaking with the correct person using two identifiers.  I discussed the limitations, risks, security and privacy concerns of performing an evaluation and management service by telemedicine and the availability of in-person appointments. I also discussed with the patient that there may be a patient responsible charge related to this service. The patient expressed understanding and agreed to proceed.   Other persons participating in the visit and their role in the encounter:  n/a   Patient's location:  Home Provider's location:  Office Chief Complaint:  Schwannoma of cranial nerve Stephanie Bautista), left  Dizziness  History of Present Ilness: Stephanie Bautista reports clear improvement in dizziness and balance during the week of decadron  4mg  therapy.  No side effects or issues otherwise.  She is anxious to continue to improve and work with physical therapy on her gait and balance.  Observations: Language and cognition at baseline  Assessment and Plan: Schwannoma of cranial nerve (HCC), left  Dizziness  Clinically improved subjectively.  Recommended resuming steroids with a prolonged taper given suspicion for inflammatory etiology.  Recommended decreasing decadron  by 1mg  each week, starting with 3mg  daily tomorrow.  Dose may be modified if focal symptoms recur.  Will place referral for neuro-PT for dystaxia, gait training.  She is agreeable.  Follow Up Instructions: We ask that Stephanie Bautista return to clinic in 2 months, or sooner as needed.  I discussed the assessment and treatment plan with the patient.  The patient was provided an opportunity to ask questions and all were answered.  The patient agreed with the plan and demonstrated understanding of the instructions.    The patient was advised to call back or seek an in-person evaluation if the symptoms worsen or if the condition fails to improve as  anticipated.    Eulas Schweitzer K April Colter, MD   I provided 20 minutes of non face-to-face telephone visit time during this encounter, and > 50% was spent counseling as documented under my assessment & plan.

## 2023-11-12 ENCOUNTER — Telehealth: Payer: Self-pay | Admitting: Internal Medicine

## 2023-11-12 NOTE — Telephone Encounter (Signed)
 Scheduled appointment per 6/20 staff message. Called and left a VM with appointment details for the patient.

## 2023-11-16 DIAGNOSIS — M25631 Stiffness of right wrist, not elsewhere classified: Secondary | ICD-10-CM | POA: Diagnosis not present

## 2023-11-18 ENCOUNTER — Ambulatory Visit: Attending: Internal Medicine | Admitting: Audiology

## 2023-11-18 DIAGNOSIS — D333 Benign neoplasm of cranial nerves: Secondary | ICD-10-CM | POA: Insufficient documentation

## 2023-11-18 DIAGNOSIS — H9313 Tinnitus, bilateral: Secondary | ICD-10-CM | POA: Insufficient documentation

## 2023-11-18 DIAGNOSIS — H903 Sensorineural hearing loss, bilateral: Secondary | ICD-10-CM | POA: Insufficient documentation

## 2023-11-18 NOTE — Procedures (Signed)
 [facility] [address] [place], [place]  [address] [phone]  AUDIOLOGICAL  EVALUATION  NAME: Stephanie Bautista     DOB:   [birth year]      MRN: [id number]                                                                                     DATE: 11/18/2023     REFERENT: Geofm Glade PARAS, MD STATUS: Outpatient DIAGNOSIS: vestibular schwannoma   History: Stephanie Bautista was seen for an audiological evaluation due to concerns regarding her hearing sensitivity. Stephanie Bautista has a history of left sided vestibular schwannoma. Stephanie Bautista is followed by [facility].  [place] underwent gamma knife radiation in 2020. Stephanie Bautista reports balance issues and dizziness. She reports she is falling more often. Stephanie Bautista reports increased concerns regarding her hearing sensitivity. Stephanie Bautista was last seen for an audiological evaluation in April at [facility] and reports her hearing sensitivity has decreased since that evaluation. Stephanie Bautista reports bilateral tinnitus. She describes the tinnitus as management and non-bothersome during the day however at night the tinnitus is severe and interferes with her sleep.    Evaluation:  Otoscopy showed a clear view of the tympanic membranes, bilaterally Tympanometry results were consistent with normal middle ear function (Type A), bilaterally.  Audiometric testing was completed using Conventional Audiometry techniques with insert earphones and TDH headphones. Test results are consistent in the right ear with a mild to moderate sensorineural hearing loss and in the left ear with a moderate to severe sensorineural hearing loss. There is a sensorineural asymmetry noted at [phone] Hz worse in the left ear. Speech Recognition Thresholds were obtained at 35 dB HL in the right ear and at 60  dB HL in the left ear. Word Recognition Testing was completed at 75 dB HL and Stephanie Bautista scored 100% in the right ear and was completed at 90 dB HL and Stephanie Bautista scored 75% in the left ear.     Results:  The test results were reviewed with Stephanie Bautista. Test results are consistent in the right ear with a mild to moderate sensorineural hearing loss and in the left ear with a moderate to severe sensorineural hearing loss. There is a sensorineural asymmetry noted at [phone] Hz worse in the left ear. In comparison to Stephanie Bautista last audiogram completed on 09/02/23 at [facility], hearing sensitivity in the right ear is stable and hearing sensitivity in the left ear has decreased slightly.  Stephanie Bautista will have hearing and communication difficulty in all listening environments. She will benefit from the use of hearing aids in both ears and the use of good communication strategies. Tinnitus management strategies were reviewed. Stephanie Bautista was given handouts on tinnitus and was recommended to schedule an appointment with the [facility] Tinnitus clinic if her tinnitus worsens.   Recommendations: 1.   Communication Needs Assessment with a hearing aid dispensing audiologist to further pursue hearing aids and amplification.  2.   Monitor hearing sensitivity 3.   Continue follow up with ENT monitoring 4.   Tinnitus evaluation at the [facility] tinnitus clinic if tinnitus worsens.    40 minutes spent testing and counseling on results.  If you have any questions please feel free to contact me at [phone].  Darryle Posey Audiologist, Au.D., CCC-A 11/18/2023  10:10 AM  Cc: Geofm Glade PARAS, MD

## 2023-11-19 DIAGNOSIS — H02831 Dermatochalasis of right upper eyelid: Secondary | ICD-10-CM | POA: Diagnosis not present

## 2023-11-19 DIAGNOSIS — I10 Essential (primary) hypertension: Secondary | ICD-10-CM | POA: Diagnosis not present

## 2023-11-19 DIAGNOSIS — H2511 Age-related nuclear cataract, right eye: Secondary | ICD-10-CM | POA: Diagnosis not present

## 2023-11-19 DIAGNOSIS — H2513 Age-related nuclear cataract, bilateral: Secondary | ICD-10-CM | POA: Diagnosis not present

## 2023-11-19 DIAGNOSIS — H02834 Dermatochalasis of left upper eyelid: Secondary | ICD-10-CM | POA: Diagnosis not present

## 2023-11-24 ENCOUNTER — Ambulatory Visit: Attending: Internal Medicine | Admitting: Physical Therapy

## 2023-11-24 ENCOUNTER — Other Ambulatory Visit: Payer: Self-pay

## 2023-11-24 ENCOUNTER — Encounter: Payer: Self-pay | Admitting: Physical Therapy

## 2023-11-24 DIAGNOSIS — M6281 Muscle weakness (generalized): Secondary | ICD-10-CM | POA: Diagnosis not present

## 2023-11-24 DIAGNOSIS — R2689 Other abnormalities of gait and mobility: Secondary | ICD-10-CM | POA: Diagnosis not present

## 2023-11-24 DIAGNOSIS — R2681 Unsteadiness on feet: Secondary | ICD-10-CM | POA: Diagnosis not present

## 2023-11-24 NOTE — Therapy (Signed)
 OUTPATIENT PHYSICAL THERAPY VESTIBULAR EVALUATION     Patient Name: Stephanie Bautista MRN: [id number] DOB:[birth year], 73 y.o., female Today's Date: 11/24/2023  END OF SESSION:  PT End of Session - 11/24/23 1510     Visit Number 1    Number of Visits 13    Date for PT Re-Evaluation 01/07/24    Authorization Type Medicare/[employer]    Progress Note Due on Visit 10    PT Start Time 0850    PT Stop Time 0930    PT Time Calculation (min) 40 min    Equipment Utilized During Treatment Gait belt    Activity Tolerance Patient tolerated treatment well    Behavior During Therapy WFL for tasks assessed/performed          Past Medical History:  Diagnosis Date   Acoustic neuroma (HCC)    Adenomatous colon polyp 12/2002   Arthritis    Breast cancer (HCC) 1996   right breast cancer in 1996, left breast cancer in 2019   Cancer (HCC)    Cholelithiases    Esophageal stricture    Family history of breast cancer    Family history of ovarian cancer    Family history of pancreatic cancer    Family history of prostate cancer    GERD (gastroesophageal reflux disease)    Hemorrhoids    Hiatal hernia    History of colon polyps    History of radiation therapy 02/07/18-03/04/2018   Left Breast/ 40.05 Gy in 15 fractions, Left Breast boost/ 10 Gy in 5 fractions.    Inguinal hernia    UTI (lower urinary tract infection)    Past Surgical History:  Procedure Laterality Date   accoustic neuroma     BREAST LUMPECTOMY     BREAST LUMPECTOMY WITH RADIOACTIVE SEED AND SENTINEL LYMPH NODE BIOPSY Left 12/15/2017   Procedure: LEFT BREAST LUMPECTOMY WITH RADIOACTIVE SEED AND LEFT SENTINEL LYMPH NODE BIOPSY, LEFT BREAST SEED GUIDED EXCISIONAL BIOPSY;  Surgeon: Ebbie Cough, MD;  Location: [facility];  Service: General;  Laterality: Left;   CESAREAN SECTION     2x   CHOLECYSTECTOMY  2008   HEMORRHOID SURGERY  2007   HEMORRHOID SURGERY N/A 07/03/2022   Procedure: HEMORRHOIDECTOMY;   Surgeon: Teresa Lonni HERO, MD;  Location: [facility];  Service: General;  Laterality: N/A;   INGUINAL HERNIA REPAIR  1968   PARTIAL HYSTERECTOMY  1996   PORT-A-CATH REMOVAL Right 12/15/2017   Procedure: REMOVAL PORT-A-CATH;  Surgeon: Ebbie Cough, MD;  Location: [facility];  Service: General;  Laterality: Right;   PORTACATH PLACEMENT Right 08/05/2017   Procedure: INSERTION PORT-A-CATH WITH US ;  Surgeon: Ebbie Cough, MD;  Location: [facility];  Service: General;  Laterality: Right;   RECTAL EXAM UNDER ANESTHESIA N/A 07/03/2022   Procedure: RECTAL EXAM UNDER ANESTHESIA;  Surgeon: Teresa Lonni HERO, MD;  Location: [facility];  Service: General;  Laterality: N/A;   SHOULDER SURGERY Right 2005   torn muscle   TONSILLECTOMY     Patient Active Problem List   Diagnosis Date Noted   Osteoporosis 09/22/2023   Poor balance 09/22/2023   Dizziness 09/22/2023   Hypertension 09/21/2023   Chemotherapy induced neutropenia (HCC) 10/05/2017   Thrush, oral 10/05/2017   Port-A-Cath in place 08/23/2017   Genetic testing 08/03/2017   Family history of breast cancer 07/27/2017   Ductal carcinoma in situ (DCIS) of right breast 07/27/2017   Schwannoma of cranial nerve (HCC), left  07/27/2017   Family history of prostate cancer    Family history of ovarian cancer    Family history of pancreatic cancer    History of colon polyps    Tinnitus of left ear 08/12/2011   Nailbed injury 08/12/2011   Sensorineural hearing loss, unilateral 08/12/2011   Malignant neoplasm of upper-outer quadrant of left breast in female, estrogen receptor negative (HCC) 10/07/2007   ADENOMATOUS COLONIC POLYP 10/07/2007   Hemorrhoids 10/07/2007   GERD 10/07/2007   Gastritis and gastroduodenitis 10/07/2007   Inguinal hernia 10/07/2007   Calculus of gallbladder 10/07/2007   Arthropathy 10/07/2007    PCP: Geofm Glade PARAS, MD  REFERRING PROVIDER: Vaslow,  Zachary K, MD   REFERRING DIAG:  R42 (ICD-10-CM) - Dizziness  D33.3 (ICD-10-CM) - Schwannoma of cranial nerve (HCC)    THERAPY DIAG:  Unsteadiness on feet  Other abnormalities of gait and mobility  Muscle weakness (generalized)  ONSET DATE: 11/08/2023 MD referral  Rationale for Evaluation and Treatment: Rehabilitation  SUBJECTIVE:   SUBJECTIVE STATEMENT: Broke both wrists and have been going through rehab for that.  All of a sudden, everything has happened overnight-my cataracts are worse, my hearing is worse (due to the schwannoma).  MD wanted me to start to therapy for my balance. Pt accompanied by: self  PERTINENT HISTORY: breast cancer; pt had sudden onset of imbalance, undsteady gait without frank vertigo on 09/08/23.  This was preceded by severe headache, like a migraine which is unusual for her.  She did improve with her gait and balance over the next few days, but without return to prior baseline.  Even now almost two months later, she stays away from driving and is walking often with a cane.  Prior she was driving independently and walking multiple miles per day.  She has vestibular schwannoma that was treated at [facility] in 2020 with radiosurgery.  She continues to have hearing impairment in the left ear, follows with audiology.  Also has had both wrists fractured in past year, bilateral cataracts.   PAIN:  Are you having pain? No  PRECAUTIONS: Fall  RED FLAGS: None   WEIGHT BEARING RESTRICTIONS: No  FALLS: Has patient fallen in last 6 months? No  LIVING ENVIRONMENT: Lives with: lives with their family Lives in: House/apartment Stairs: Steps to enter home, handrails Has following equipment at home: Single point cane  PLOF: Independent; Enjoys walking as exercise  PATIENT GOALS: To get my balance better and walk out the door more steady  OBJECTIVE:  Note: Objective measures were completed at Evaluation unless otherwise noted.  DIAGNOSTIC FINDINGS: No  acute intracranial abnormality-CT 08/2023  COGNITION: Overall cognitive status: Within functional limits for tasks assessed   SENSATION: Light touch: WFL  POSTURE:  rounded shoulders  LOWER EXTREMITY MMT:   MMT Right eval Left eval  Hip flexion 4 4+  Hip abduction 4+ 4+  Hip adduction 5 5  Hip internal rotation    Hip external rotation    Knee flexion 5 5  Knee extension 5 5  Ankle dorsiflexion 4 4  Ankle plantarflexion    Ankle inversion    Ankle eversion    (Blank rows = not tested)  TRANSFERS: Assistive device utilized: None  Sit to stand: Complete Independence Stand to sit: Complete Independence   GAIT: Gait pattern: holds hands together at chest/midline-guarding due to not wanting to fall, step through pattern, decreased arm swing- Left, decreased step length- Right, ataxic, and wide BOS Distance walked: 50 ft Assistive device utilized:  None Level of assistance: SBA Comments: Reports she holds hands at chest/midline due to not wanting to lose balance  FUNCTIONAL TESTS:  5 times sit to stand: 12.97 sec 10 meter walk test: 12 sec (2.73 ft/sec) SLS:  2.47 sec LLE, 0.85 sec RLE Tandem: 1.5 sec LLE in back, 1.3 sec RLE in back FGA:  18/30  M-CTSIB  Condition 1: Firm Surface, EO 30 Sec, Normal Sway  Condition 2: Firm Surface, EC 30 Sec, Normal Sway  Condition 3: Foam Surface, EO 30 Sec, Mild Sway  Condition 4: Foam Surface, EC 30 Sec, Mild Sway    [facility] PT Assessment - 11/24/23 0912       Functional Gait  Assessment   Gait assessed  Yes    Gait Level Surface Walks 20 ft, slow speed, abnormal gait pattern, evidence for imbalance or deviates 10-15 in outside of the 12 in walkway width. Requires more than 7 sec to ambulate 20 ft.   7.75 sec   Change in Gait Speed Able to change speed, demonstrates mild gait deviations, deviates 6-10 in outside of the 12 in walkway width, or no gait deviations, unable to achieve a major change in velocity, or uses a change in  velocity, or uses an assistive device.    Gait with Horizontal Head Turns Performs head turns smoothly with slight change in gait velocity (eg, minor disruption to smooth gait path), deviates 6-10 in outside 12 in walkway width, or uses an assistive device.    Gait with Vertical Head Turns Performs task with slight change in gait velocity (eg, minor disruption to smooth gait path), deviates 6 - 10 in outside 12 in walkway width or uses assistive device    Gait and Pivot Turn Pivot turns safely within 3 sec and stops quickly with no loss of balance.    Step Over Obstacle Is able to step over one shoe box (4.5 in total height) without changing gait speed. No evidence of imbalance.    Gait with Narrow Base of Support Ambulates less than 4 steps heel to toe or cannot perform without assistance.    Gait with Eyes Closed Walks 20 ft, slow speed, abnormal gait pattern, evidence for imbalance, deviates 10-15 in outside 12 in walkway width. Requires more than 9 sec to ambulate 20 ft.   11.53 sec   Ambulating Backwards Walks 20 ft, no assistive devices, good speed, no evidence for imbalance, normal gait   10.5   Steps Alternating feet, must use rail.    Total Score 18    FGA comment: Scores <22/30 indicates increased fall risk            VESTIBULAR ASSESSMENT:  GENERAL OBSERVATION: Pt does not report frank dizziness or spinning; main concerns being balance.  Did not proceed with vestibular portion of eval.  TREATMENT DATE: 11/24/2023    PATIENT EDUCATION: Education details: Eval results, POC, Initial HEP Person educated: Patient Education method: Explanation, Demonstration, Verbal cues, and Handouts Education comprehension: verbalized understanding, returned demonstration, and needs further education  HOME EXERCISE PROGRAM: Access Code: [email or web address] URL:  [email or web address] Date: 11/24/2023 Prepared by: [facility] - Outpatient  Rehab - [facility] *Walk with relaxed arm swing in home, 2-3 minutes/several times per day Exercises - Standing Romberg to 1/2 Tandem Stance  - 1 x daily - 7 x weekly - 1 sets - 3 reps - 15 sec hold - Single Leg Stance with Support  - 1 x daily - 7 x weekly - 1 sets - 3 reps - 10 sec hold GOALS: Goals reviewed with patient? Yes  SHORT TERM GOALS: Target date: 12/24/2023  Pt will be independent with HEP for improved balance, gait. Baseline: Goal status: INITIAL  LONG TERM GOALS: Target date: 01/07/2024  Pt will be independent with HEP for improved balance, gait. Baseline:  Goal status: INITIAL  2.  Pt will improve SLS to at least 3 sec each leg for improved gait, obstacle, stair negotiation. Baseline: 1-2 sec Goal status: INITIAL  3.  Pt will improve tandem stance to at least 15 sec each position, for improved balance. Baseline: 1 sec Goal status: INITIAL  4.  Pt will improve FGA score to at least 23/30 to decrease fall risk. Baseline: 18/30 Goal status: INITIAL  5.  Pt will ambulate at least 1000 ft, independent with least restrictive or no device, for improved return to community gait and exercise. Baseline:  Goal status: INITIAL   ASSESSMENT:  CLINICAL IMPRESSION: Patient is a 73 y.o. female who was seen today for physical therapy evaluation and treatment for dizziness/balance with history of schwannoma of cranial nerve.  She had radiation for this in 2020; she reports having increased dizziness for the past several months, no spinning or vertigo sensations, just more balance issues.  She has had 2 falls in the past 6-12 months, resulting in bilateral wrist fractures.  Prior to the onset of her dizziness/imbalance recently, she was independent with gait, and now she reports using walking stick for her neighborhood walking activities.  She presents today with decreased strength,  decreased balance, abnormal (wide, ataxic) gait pattern.  She has difficulty with maintaining balance in single limb stance or tandem stance.  With gait, she veers with normal pace and with vision removed.  She is at increased fall risk per FGA score of 18/30.  She would benefit from skilled PT to address the above stated deficits to decrease fall risk and improve overall functional mobility.   OBJECTIVE IMPAIRMENTS: Abnormal gait, decreased balance, and decreased strength.   ACTIVITY LIMITATIONS: standing, stairs, locomotion level, and caring for others  PARTICIPATION LIMITATIONS: shopping and community activity  PERSONAL FACTORS: 3+ comorbidities: see above are also affecting patient's functional outcome.   REHAB POTENTIAL: Good  CLINICAL DECISION MAKING: Evolving/moderate complexity  EVALUATION COMPLEXITY: Moderate   PLAN:  PT FREQUENCY: 1-2x/week  PT DURATION: 6 weeks plus eval visit  PLANNED INTERVENTIONS: 97750- Physical Performance Testing, 97110-Therapeutic exercises, 97530- Therapeutic activity, 97112- Neuromuscular re-education, 97535- Self Care, [id number]- Manual therapy, [phone]- Gait training, Patient/Family education, Balance training, and Vestibular training  PLAN FOR NEXT SESSION: Review initial HEP and progress HEP for balance, gait and ankle/hip strength; work on balance strategies   Cashtyn Pouliot W., PT 11/24/2023, 3:11 PM   [facility] [address] [place], [address] [place], [place]  [address] Phone # [phone] Fax # [phone]

## 2023-11-25 NOTE — Therapy (Signed)
 OUTPATIENT PHYSICAL THERAPY VESTIBULAR TREATMENT     Patient Name: Stephanie Bautista MRN: [id number] DOB:[birth year], 73 y.o., female Today's Date: 11/29/2023  END OF SESSION:  PT End of Session - 11/29/23 1013     Visit Number 2    Number of Visits 13    Date for PT Re-Evaluation 01/07/24    Authorization Type Medicare/[employer]    Progress Note Due on Visit 10    PT Start Time 0934    PT Stop Time 1013    PT Time Calculation (min) 39 min    Equipment Utilized During Treatment Gait belt    Activity Tolerance Patient tolerated treatment well    Behavior During Therapy WFL for tasks assessed/performed           Past Medical History:  Diagnosis Date   Acoustic neuroma (HCC)    Adenomatous colon polyp 12/2002   Arthritis    Breast cancer (HCC) 1996   right breast cancer in 1996, left breast cancer in 2019   Cancer (HCC)    Cholelithiases    Esophageal stricture    Family history of breast cancer    Family history of ovarian cancer    Family history of pancreatic cancer    Family history of prostate cancer    GERD (gastroesophageal reflux disease)    Hemorrhoids    Hiatal hernia    History of colon polyps    History of radiation therapy 02/07/18-03/04/2018   Left Breast/ 40.05 Gy in 15 fractions, Left Breast boost/ 10 Gy in 5 fractions.    Inguinal hernia    UTI (lower urinary tract infection)    Past Surgical History:  Procedure Laterality Date   accoustic neuroma     BREAST LUMPECTOMY     BREAST LUMPECTOMY WITH RADIOACTIVE SEED AND SENTINEL LYMPH NODE BIOPSY Left 12/15/2017   Procedure: LEFT BREAST LUMPECTOMY WITH RADIOACTIVE SEED AND LEFT SENTINEL LYMPH NODE BIOPSY, LEFT BREAST SEED GUIDED EXCISIONAL BIOPSY;  Surgeon: Ebbie Cough, MD;  Location: [facility];  Service: General;  Laterality: Left;   CESAREAN SECTION     2x   CHOLECYSTECTOMY  2008   HEMORRHOID SURGERY  2007   HEMORRHOID SURGERY N/A 07/03/2022   Procedure: HEMORRHOIDECTOMY;   Surgeon: Teresa Lonni HERO, MD;  Location: [facility];  Service: General;  Laterality: N/A;   INGUINAL HERNIA REPAIR  1968   PARTIAL HYSTERECTOMY  1996   PORT-A-CATH REMOVAL Right 12/15/2017   Procedure: REMOVAL PORT-A-CATH;  Surgeon: Ebbie Cough, MD;  Location: [facility];  Service: General;  Laterality: Right;   PORTACATH PLACEMENT Right 08/05/2017   Procedure: INSERTION PORT-A-CATH WITH US ;  Surgeon: Ebbie Cough, MD;  Location: [facility];  Service: General;  Laterality: Right;   RECTAL EXAM UNDER ANESTHESIA N/A 07/03/2022   Procedure: RECTAL EXAM UNDER ANESTHESIA;  Surgeon: Teresa Lonni HERO, MD;  Location: [facility];  Service: General;  Laterality: N/A;   SHOULDER SURGERY Right 2005   torn muscle   TONSILLECTOMY     Patient Active Problem List   Diagnosis Date Noted   Osteoporosis 09/22/2023   Poor balance 09/22/2023   Dizziness 09/22/2023   Hypertension 09/21/2023   Chemotherapy induced neutropenia (HCC) 10/05/2017   Thrush, oral 10/05/2017   Port-A-Cath in place 08/23/2017   Genetic testing 08/03/2017   Family history of breast cancer 07/27/2017   Ductal carcinoma in situ (DCIS) of right breast 07/27/2017   Schwannoma of cranial nerve (HCC),  left 07/27/2017   Family history of prostate cancer    Family history of ovarian cancer    Family history of pancreatic cancer    History of colon polyps    Tinnitus of left ear 08/12/2011   Nailbed injury 08/12/2011   Sensorineural hearing loss, unilateral 08/12/2011   Malignant neoplasm of upper-outer quadrant of left breast in female, estrogen receptor negative (HCC) 10/07/2007   ADENOMATOUS COLONIC POLYP 10/07/2007   Hemorrhoids 10/07/2007   GERD 10/07/2007   Gastritis and gastroduodenitis 10/07/2007   Inguinal hernia 10/07/2007   Calculus of gallbladder 10/07/2007   Arthropathy 10/07/2007    PCP: Geofm Glade PARAS, MD  REFERRING PROVIDER:  Vaslow, Zachary K, MD   REFERRING DIAG:  R42 (ICD-10-CM) - Dizziness  D33.3 (ICD-10-CM) - Schwannoma of cranial nerve (HCC)    THERAPY DIAG:  Unsteadiness on feet  Other abnormalities of gait and mobility  Muscle weakness (generalized)  ONSET DATE: 11/08/2023 MD referral  Rationale for Evaluation and Treatment: Rehabilitation  SUBJECTIVE:   SUBJECTIVE STATEMENT: I didn't know my balance was so bad. Reports that she was released from therapy for her wrists. Reports that she walks 10,000 steps a day but wants to work on strengthening. Reports L wrist fx in September 2024 and R wrist fx in February.   Pt accompanied by: self  PERTINENT HISTORY: breast cancer; pt had sudden onset of imbalance, undsteady gait without frank vertigo on 09/08/23.  This was preceded by severe headache, like a migraine which is unusual for her.  She did improve with her gait and balance over the next few days, but without return to prior baseline.  Even now almost two months later, she stays away from driving and is walking often with a cane.  Prior she was driving independently and walking multiple miles per day.  She has vestibular schwannoma that was treated at [facility] in 2020 with radiosurgery.  She continues to have hearing impairment in the left ear, follows with audiology.  Also has had both wrists fractured in past year, bilateral cataracts.   PAIN:  Are you having pain? No  PRECAUTIONS: Fall  RED FLAGS: None   WEIGHT BEARING RESTRICTIONS: No  FALLS: Has patient fallen in last 6 months? No  LIVING ENVIRONMENT: Lives with: lives with their family Lives in: House/apartment Stairs: Steps to enter home, handrails Has following equipment at home: Single point cane  PLOF: Independent; Enjoys walking as exercise  PATIENT GOALS: To get my balance better and walk out the door more steady  OBJECTIVE:      TODAY'S TREATMENT: 11/29/23 Activity Comments  Review of initial HEP: gait with  armswing romberg to 1/2 tandem SLS Cueing to bring feet closer for tandem. Adjusted SLS exercise to reduce reliance on reaching strategy   Alt toe tap to cone (single and double tap) Required cueing and practice performing lateral wt shift, especially to R side   walking on heels/toes No UE support; cues to maintain heels up  squat to chair with green medball 2x10  Some knee valgus which was addressed with red TB above knees on 2nd set   alt step ups holding green medball at chest 3x30 Some difficulty coordinating reciprocal steps      HOME EXERCISE PROGRAM Last updated: 11/29/23' Access Code: [email or web address] URL: [email or web address] Date: 11/29/2023 Prepared by: [facility] - Outpatient  Rehab - [facility]  Exercises - Standing Romberg to 1/2 Tandem Stance  - 1 x daily - 7 x weekly - 1 sets -  3 reps - 15 sec hold - Single Leg Stance with Support  - 1 x daily - 7 x weekly - 1 sets - 3 reps - 10 sec hold - Standing Toe Taps  - 1 x daily - 5 x weekly - 2 sets - 10 reps - Squat with Chair Touch  - 1 x daily - 5 x weekly - 2 sets - 10 reps    PATIENT EDUCATION: Education details: HEP update with edu for safety Person educated: Patient Education method: Explanation, Demonstration, Tactile cues, Verbal cues, and Handouts Education comprehension: verbalized understanding and returned demonstration    Note: Objective measures were completed at Evaluation unless otherwise noted.  DIAGNOSTIC FINDINGS: No acute intracranial abnormality-CT 08/2023  COGNITION: Overall cognitive status: Within functional limits for tasks assessed   SENSATION: Light touch: WFL  POSTURE:  rounded shoulders  LOWER EXTREMITY MMT:   MMT Right eval Left eval  Hip flexion 4 4+  Hip abduction 4+ 4+  Hip adduction 5 5  Hip internal rotation    Hip external rotation    Knee flexion 5 5  Knee extension 5 5  Ankle dorsiflexion 4 4  Ankle plantarflexion    Ankle inversion    Ankle  eversion    (Blank rows = not tested)  TRANSFERS: Assistive device utilized: None  Sit to stand: Complete Independence Stand to sit: Complete Independence   GAIT: Gait pattern: holds hands together at chest/midline-guarding due to not wanting to fall, step through pattern, decreased arm swing- Left, decreased step length- Right, ataxic, and wide BOS Distance walked: 50 ft Assistive device utilized: None Level of assistance: SBA Comments: Reports she holds hands at chest/midline due to not wanting to lose balance  FUNCTIONAL TESTS:  5 times sit to stand: 12.97 sec 10 meter walk test: 12 sec (2.73 ft/sec) SLS:  2.47 sec LLE, 0.85 sec RLE Tandem: 1.5 sec LLE in back, 1.3 sec RLE in back FGA:  18/30  M-CTSIB  Condition 1: Firm Surface, EO 30 Sec, Normal Sway  Condition 2: Firm Surface, EC 30 Sec, Normal Sway  Condition 3: Foam Surface, EO 30 Sec, Mild Sway  Condition 4: Foam Surface, EC 30 Sec, Mild Sway        VESTIBULAR ASSESSMENT:  GENERAL OBSERVATION: Pt does not report frank dizziness or spinning; main concerns being balance.  Did not proceed with vestibular portion of eval.                                                                                                                             TREATMENT DATE: 11/24/2023    PATIENT EDUCATION: Education details: Eval results, POC, Initial HEP Person educated: Patient Education method: Explanation, Demonstration, Verbal cues, and Handouts Education comprehension: verbalized understanding, returned demonstration, and needs further education  HOME EXERCISE PROGRAM: Access Code: [email or web address] URL: [email or web address] Date: 11/24/2023 Prepared by: [facility] - Outpatient  Rehab - [facility] *Walk with relaxed arm swing in home,  2-3 minutes/several times per day Exercises - Standing Romberg to 1/2 Tandem Stance  - 1 x daily - 7 x weekly - 1 sets - 3 reps - 15 sec hold - Single Leg Stance with  Support  - 1 x daily - 7 x weekly - 1 sets - 3 reps - 10 sec hold GOALS: Goals reviewed with patient? Yes  SHORT TERM GOALS: Target date: 12/24/2023  Pt will be independent with HEP for improved balance, gait. Baseline: Goal status: IN PROGRESS  LONG TERM GOALS: Target date: 01/07/2024  Pt will be independent with HEP for improved balance, gait. Baseline:  Goal status: IN PROGRESS  2.  Pt will improve SLS to at least 3 sec each leg for improved gait, obstacle, stair negotiation. Baseline: 1-2 sec Goal status: IN PROGRESS  3.  Pt will improve tandem stance to at least 15 sec each position, for improved balance. Baseline: 1 sec Goal status: IN PROGRESS  4.  Pt will improve FGA score to at least 23/30 to decrease fall risk. Baseline: 18/30 Goal status: IN PROGRESS  5.  Pt will ambulate at least 1000 ft, independent with least restrictive or no device, for improved return to community gait and exercise. Baseline:  Goal status: IN PROGRESS   ASSESSMENT:  CLINICAL IMPRESSION: Patient arrived to session without complaints. Reviewed HEP for max understanding and carryover, providing minor cues as needed. Patient performed SLS activities, functional strengthening in closed chain positions, and narrow BOS balance tasks. Patient required cueing for alignment, pacing, and weight shifting for improved performance. No complaints upon leaving.   OBJECTIVE IMPAIRMENTS: Abnormal gait, decreased balance, and decreased strength.   ACTIVITY LIMITATIONS: standing, stairs, locomotion level, and caring for others  PARTICIPATION LIMITATIONS: shopping and community activity  PERSONAL FACTORS: 3+ comorbidities: see above are also affecting patient's functional outcome.   REHAB POTENTIAL: Good  CLINICAL DECISION MAKING: Evolving/moderate complexity  EVALUATION COMPLEXITY: Moderate   PLAN:  PT FREQUENCY: 1-2x/week  PT DURATION: 6 weeks plus eval visit  PLANNED INTERVENTIONS: [id number]-  Physical Performance Testing, 97110-Therapeutic exercises, 97530- Therapeutic activity, 97112- Neuromuscular re-education, 97535- Self Care, [id number]- Manual therapy, [phone]- Gait training, Patient/Family education, Balance training, and Vestibular training  PLAN FOR NEXT SESSION: Review HEP and progress HEP for balance, gait and ankle/hip strength; work on balance strategies     Louana Terrilyn Christians, PT, DPT 11/29/23 10:15 AM  [facility] [address] [place], [place] [address] Phone # [phone] Fax # [phone]

## 2023-11-29 ENCOUNTER — Ambulatory Visit: Admitting: Physical Therapy

## 2023-11-29 ENCOUNTER — Encounter: Payer: Self-pay | Admitting: Physical Therapy

## 2023-11-29 DIAGNOSIS — R2681 Unsteadiness on feet: Secondary | ICD-10-CM

## 2023-11-29 DIAGNOSIS — M6281 Muscle weakness (generalized): Secondary | ICD-10-CM | POA: Diagnosis not present

## 2023-11-29 DIAGNOSIS — R2689 Other abnormalities of gait and mobility: Secondary | ICD-10-CM

## 2023-12-02 ENCOUNTER — Ambulatory Visit: Admitting: Physical Therapy

## 2023-12-02 ENCOUNTER — Other Ambulatory Visit: Payer: Self-pay | Admitting: Internal Medicine

## 2023-12-02 ENCOUNTER — Encounter: Payer: Self-pay | Admitting: Physical Therapy

## 2023-12-02 DIAGNOSIS — R2689 Other abnormalities of gait and mobility: Secondary | ICD-10-CM | POA: Diagnosis not present

## 2023-12-02 DIAGNOSIS — R2681 Unsteadiness on feet: Secondary | ICD-10-CM

## 2023-12-02 DIAGNOSIS — M6281 Muscle weakness (generalized): Secondary | ICD-10-CM | POA: Diagnosis not present

## 2023-12-02 NOTE — Therapy (Signed)
 OUTPATIENT PHYSICAL THERAPY VESTIBULAR TREATMENT     Patient Name: Stephanie Bautista MRN: [id number] DOB:1951-02-04, 73 y.o., female Today's Date: 12/02/2023  END OF SESSION:  PT End of Session - 12/02/23 0939     Visit Number 3    Number of Visits 13    Date for PT Re-Evaluation 01/07/24    Authorization Type Medicare/[employer]    Progress Note Due on Visit 10    PT Start Time 0936    PT Stop Time 1014    PT Time Calculation (min) 38 min    Equipment Utilized During Treatment Gait belt    Activity Tolerance Patient tolerated treatment well    Behavior During Therapy WFL for tasks assessed/performed            Past Medical History:  Diagnosis Date   Acoustic neuroma (HCC)    Adenomatous colon polyp 12/2002   Arthritis    Breast cancer (HCC) 1996   right breast cancer in 1996, left breast cancer in 2019   Cancer (HCC)    Cholelithiases    Esophageal stricture    Family history of breast cancer    Family history of ovarian cancer    Family history of pancreatic cancer    Family history of prostate cancer    GERD (gastroesophageal reflux disease)    Hemorrhoids    Hiatal hernia    History of colon polyps    History of radiation therapy 02/07/18-03/04/2018   Left Breast/ 40.05 Gy in 15 fractions, Left Breast boost/ 10 Gy in 5 fractions.    Inguinal hernia    UTI (lower urinary tract infection)    Past Surgical History:  Procedure Laterality Date   accoustic neuroma     BREAST LUMPECTOMY     BREAST LUMPECTOMY WITH RADIOACTIVE SEED AND SENTINEL LYMPH NODE BIOPSY Left 12/15/2017   Procedure: LEFT BREAST LUMPECTOMY WITH RADIOACTIVE SEED AND LEFT SENTINEL LYMPH NODE BIOPSY, LEFT BREAST SEED GUIDED EXCISIONAL BIOPSY;  Surgeon: Ebbie Cough, MD;  Location: [facility];  Service: General;  Laterality: Left;   CESAREAN SECTION     2x   CHOLECYSTECTOMY  2008   HEMORRHOID SURGERY  2007   HEMORRHOID SURGERY N/A 07/03/2022   Procedure:  HEMORRHOIDECTOMY;  Surgeon: Teresa Lonni HERO, MD;  Location: [facility];  Service: General;  Laterality: N/A;   INGUINAL HERNIA REPAIR  1968   PARTIAL HYSTERECTOMY  1996   PORT-A-CATH REMOVAL Right 12/15/2017   Procedure: REMOVAL PORT-A-CATH;  Surgeon: Ebbie Cough, MD;  Location: [facility];  Service: General;  Laterality: Right;   PORTACATH PLACEMENT Right 08/05/2017   Procedure: INSERTION PORT-A-CATH WITH US ;  Surgeon: Ebbie Cough, MD;  Location: [facility];  Service: General;  Laterality: Right;   RECTAL EXAM UNDER ANESTHESIA N/A 07/03/2022   Procedure: RECTAL EXAM UNDER ANESTHESIA;  Surgeon: Teresa Lonni HERO, MD;  Location: [facility];  Service: General;  Laterality: N/A;   SHOULDER SURGERY Right 2005   torn muscle   TONSILLECTOMY     Patient Active Problem List   Diagnosis Date Noted   Osteoporosis 09/22/2023   Poor balance 09/22/2023   Dizziness 09/22/2023   Hypertension 09/21/2023   Chemotherapy induced neutropenia (HCC) 10/05/2017   Thrush, oral 10/05/2017   Port-A-Cath in place 08/23/2017   Genetic testing 08/03/2017   Family history of breast cancer 07/27/2017   Ductal carcinoma in situ (DCIS) of right breast 07/27/2017   Schwannoma of cranial nerve (  HCC), left 07/27/2017   Family history of prostate cancer    Family history of ovarian cancer    Family history of pancreatic cancer    History of colon polyps    Tinnitus of left ear 08/12/2011   Nailbed injury 08/12/2011   Sensorineural hearing loss, unilateral 08/12/2011   Malignant neoplasm of upper-outer quadrant of left breast in female, estrogen receptor negative (HCC) 10/07/2007   ADENOMATOUS COLONIC POLYP 10/07/2007   Hemorrhoids 10/07/2007   GERD 10/07/2007   Gastritis and gastroduodenitis 10/07/2007   Inguinal hernia 10/07/2007   Calculus of gallbladder 10/07/2007   Arthropathy 10/07/2007    PCP: Geofm Glade PARAS,  MD  REFERRING PROVIDER: Vaslow, Zachary K, MD   REFERRING DIAG:  R42 (ICD-10-CM) - Dizziness  D33.3 (ICD-10-CM) - Schwannoma of cranial nerve (HCC)    THERAPY DIAG:  Unsteadiness on feet  Other abnormalities of gait and mobility  Muscle weakness (generalized)  ONSET DATE: 11/08/2023 MD referral  Rationale for Evaluation and Treatment: Rehabilitation  SUBJECTIVE:   SUBJECTIVE STATEMENT: Been trying to do my steps and walking-was able to do that without the cane the last few days.   Pt accompanied by: self  PERTINENT HISTORY: breast cancer; pt had sudden onset of imbalance, undsteady gait without frank vertigo on 09/08/23.  This was preceded by severe headache, like a migraine which is unusual for her.  She did improve with her gait and balance over the next few days, but without return to prior baseline.  Even now almost two months later, she stays away from driving and is walking often with a cane.  Prior she was driving independently and walking multiple miles per day.  She has vestibular schwannoma that was treated at [facility] in 2020 with radiosurgery.  She continues to have hearing impairment in the left ear, follows with audiology.  Also has had both wrists fractured in past year, bilateral cataracts.   PAIN:  Are you having pain? No  PRECAUTIONS: Fall  RED FLAGS: None   WEIGHT BEARING RESTRICTIONS: No  FALLS: Has patient fallen in last 6 months? No  LIVING ENVIRONMENT: Lives with: lives with their family Lives in: House/apartment Stairs: Steps to enter home, handrails Has following equipment at home: Single point cane  PLOF: Independent; Enjoys walking as exercise  PATIENT GOALS: To get my balance better and walk out the door more steady  OBJECTIVE:     TODAY'S TREATMENT: 12/02/2023 Activity Comments  Review of HEP additions: Step taps to cones minisquats Cues for UE support as needed with taps Cues to avoid internal rotation   Resisted sidesteps  15 ft x 2 reps Red band at [employer] monster walks 20 ft x 2-forward and back Red band at knees  Tandem gait forward/back More unsteady backwards  Corner balance on foam: Feet apart EO + head turns/nods 30 sec EC 30 sec   Heel toe raises Wall bumps For ankle/hip strategy         HOME EXERCISE PROGRAM  Access Code: [email or web address] URL: [email or web address] Date: 12/02/2023 Prepared by: [facility] - Outpatient  Rehab - [facility]  Exercises - Standing Romberg to 1/2 Tandem Stance  - 1 x daily - 7 x weekly - 1 sets - 3 reps - 15 sec hold - Single Leg Stance with Support  - 1 x daily - 7 x weekly - 1 sets - 3 reps - 10 sec hold - Standing Toe Taps  - 1 x daily - 5 x weekly -  2 sets - 10 reps - Squat with Chair Touch  - 1 x daily - 5 x weekly - 2 sets - 10 reps - Side Stepping with Resistance at Thighs and Counter Support  - 1 x daily - 7 x weekly - 1 sets - 3-5 reps - Forward Backward Monster Walk with Band at Thighs and Counter Support  - 1 x daily - 7 x weekly - 1 sets - 3-5 reps - Tandem Walking with Counter Support  - 1 x daily - 7 x weekly - 1 sets - 3-5 reps      PATIENT EDUCATION: Education details: HEP update with edu for safety Person educated: Patient Education method: Explanation, Demonstration, Tactile cues, Verbal cues, and Handouts Education comprehension: verbalized understanding and returned demonstration    Note: Objective measures were completed at Evaluation unless otherwise noted.  DIAGNOSTIC FINDINGS: No acute intracranial abnormality-CT 08/2023  COGNITION: Overall cognitive status: Within functional limits for tasks assessed   SENSATION: Light touch: WFL  POSTURE:  rounded shoulders  LOWER EXTREMITY MMT:   MMT Right eval Left eval  Hip flexion 4 4+  Hip abduction 4+ 4+  Hip adduction 5 5  Hip internal rotation    Hip external rotation    Knee flexion 5 5  Knee extension 5 5  Ankle dorsiflexion 4 4  Ankle  plantarflexion    Ankle inversion    Ankle eversion    (Blank rows = not tested)  TRANSFERS: Assistive device utilized: None  Sit to stand: Complete Independence Stand to sit: Complete Independence   GAIT: Gait pattern: holds hands together at chest/midline-guarding due to not wanting to fall, step through pattern, decreased arm swing- Left, decreased step length- Right, ataxic, and wide BOS Distance walked: 50 ft Assistive device utilized: None Level of assistance: SBA Comments: Reports she holds hands at chest/midline due to not wanting to lose balance  FUNCTIONAL TESTS:  5 times sit to stand: 12.97 sec 10 meter walk test: 12 sec (2.73 ft/sec) SLS:  2.47 sec LLE, 0.85 sec RLE Tandem: 1.5 sec LLE in back, 1.3 sec RLE in back FGA:  18/30  M-CTSIB  Condition 1: Firm Surface, EO 30 Sec, Normal Sway  Condition 2: Firm Surface, EC 30 Sec, Normal Sway  Condition 3: Foam Surface, EO 30 Sec, Mild Sway  Condition 4: Foam Surface, EC 30 Sec, Mild Sway        VESTIBULAR ASSESSMENT:  GENERAL OBSERVATION: Pt does not report frank dizziness or spinning; main concerns being balance.  Did not proceed with vestibular portion of eval.                                                                                                                             TREATMENT DATE: 11/24/2023    PATIENT EDUCATION: Education details: Eval results, POC, Initial HEP Person educated: Patient Education method: Explanation, Demonstration, Verbal cues, and Handouts Education comprehension: verbalized understanding, returned demonstration, and needs further  education  HOME EXERCISE PROGRAM: Access Code: [email or web address] URL: [email or web address] Date: 11/24/2023 Prepared by: [facility] - Outpatient  Rehab - [facility] *Walk with relaxed arm swing in home, 2-3 minutes/several times per day Exercises - Standing Romberg to 1/2 Tandem Stance  - 1 x daily - 7 x weekly - 1 sets - 3 reps  - 15 sec hold - Single Leg Stance with Support  - 1 x daily - 7 x weekly - 1 sets - 3 reps - 10 sec hold GOALS: Goals reviewed with patient? Yes  SHORT TERM GOALS: Target date: 12/24/2023  Pt will be independent with HEP for improved balance, gait. Baseline: Goal status: IN PROGRESS  LONG TERM GOALS: Target date: 01/07/2024  Pt will be independent with HEP for improved balance, gait. Baseline:  Goal status: IN PROGRESS  2.  Pt will improve SLS to at least 3 sec each leg for improved gait, obstacle, stair negotiation. Baseline: 1-2 sec Goal status: IN PROGRESS  3.  Pt will improve tandem stance to at least 15 sec each position, for improved balance. Baseline: 1 sec Goal status: IN PROGRESS  4.  Pt will improve FGA score to at least 23/30 to decrease fall risk. Baseline: 18/30 Goal status: IN PROGRESS  5.  Pt will ambulate at least 1000 ft, independent with least restrictive or no device, for improved return to community gait and exercise. Baseline:  Goal status: IN PROGRESS   ASSESSMENT:  CLINICAL IMPRESSION: Pt presents today with no new complaints.  Skilled PT session focused on review and progression of HEP. Pt needs cues for correct technique and cues to utilize light UE support for improved stability with exercises.  Initiated corner balance exercises in session today on [employer], and pt has increased unsteadiness with added challenge of vision removed.  Pt will continue to benefit from skilled PT towards goals for improved functional mobility and decreased fall risk.   OBJECTIVE IMPAIRMENTS: Abnormal gait, decreased balance, and decreased strength.   ACTIVITY LIMITATIONS: standing, stairs, locomotion level, and caring for others  PARTICIPATION LIMITATIONS: shopping and community activity  PERSONAL FACTORS: 3+ comorbidities: see above are also affecting patient's functional outcome.   REHAB POTENTIAL: Good  CLINICAL DECISION MAKING: Evolving/moderate  complexity  EVALUATION COMPLEXITY: Moderate   PLAN:  PT FREQUENCY: 1-2x/week  PT DURATION: 6 weeks plus eval visit  PLANNED INTERVENTIONS: 97750- Physical Performance Testing, 97110-Therapeutic exercises, 97530- Therapeutic activity, 97112- Neuromuscular re-education, 97535- Self Care, [id number]- Manual therapy, [phone]- Gait training, Patient/Family education, Balance training, and Vestibular training  PLAN FOR NEXT SESSION: Review HEP and progress HEP for balance, gait and ankle/hip strength; work on balance strategies     Greig Anon, PT 12/02/23 10:14 AM Phone: [phone] Fax: [phone]  [facility] at [facility] [address], [address] [place], [place] [address] Phone # [phone] Fax # [phone]

## 2023-12-06 ENCOUNTER — Ambulatory Visit: Admitting: Physical Therapy

## 2023-12-06 ENCOUNTER — Encounter: Payer: Self-pay | Admitting: Physical Therapy

## 2023-12-06 DIAGNOSIS — R2689 Other abnormalities of gait and mobility: Secondary | ICD-10-CM | POA: Diagnosis not present

## 2023-12-06 DIAGNOSIS — M6281 Muscle weakness (generalized): Secondary | ICD-10-CM

## 2023-12-06 DIAGNOSIS — R2681 Unsteadiness on feet: Secondary | ICD-10-CM

## 2023-12-06 NOTE — Therapy (Signed)
 OUTPATIENT PHYSICAL THERAPY VESTIBULAR TREATMENT     Patient Name: Stephanie Bautista MRN: [id number] DOB:Oct 21, 1950, 73 y.o., female Today's Date: 12/06/2023  END OF SESSION:  PT End of Session - 12/06/23 0936     Visit Number 4    Number of Visits 13    Date for PT Re-Evaluation 01/07/24    Authorization Type Medicare/[employer]    Progress Note Due on Visit 10    PT Start Time 0935    PT Stop Time 1013    PT Time Calculation (min) 38 min    Equipment Utilized During Treatment Gait belt    Activity Tolerance Patient tolerated treatment well    Behavior During Therapy WFL for tasks assessed/performed             Past Medical History:  Diagnosis Date   Acoustic neuroma (HCC)    Adenomatous colon polyp 12/2002   Arthritis    Breast cancer (HCC) 1996   right breast cancer in 1996, left breast cancer in 2019   Cancer (HCC)    Cholelithiases    Esophageal stricture    Family history of breast cancer    Family history of ovarian cancer    Family history of pancreatic cancer    Family history of prostate cancer    GERD (gastroesophageal reflux disease)    Hemorrhoids    Hiatal hernia    History of colon polyps    History of radiation therapy 02/07/18-03/04/2018   Left Breast/ 40.05 Gy in 15 fractions, Left Breast boost/ 10 Gy in 5 fractions.    Inguinal hernia    UTI (lower urinary tract infection)    Past Surgical History:  Procedure Laterality Date   accoustic neuroma     BREAST LUMPECTOMY     BREAST LUMPECTOMY WITH RADIOACTIVE SEED AND SENTINEL LYMPH NODE BIOPSY Left 12/15/2017   Procedure: LEFT BREAST LUMPECTOMY WITH RADIOACTIVE SEED AND LEFT SENTINEL LYMPH NODE BIOPSY, LEFT BREAST SEED GUIDED EXCISIONAL BIOPSY;  Surgeon: Ebbie Cough, MD;  Location: [facility];  Service: General;  Laterality: Left;   CESAREAN SECTION     2x   CHOLECYSTECTOMY  2008   HEMORRHOID SURGERY  2007   HEMORRHOID SURGERY N/A 07/03/2022   Procedure:  HEMORRHOIDECTOMY;  Surgeon: Teresa Lonni HERO, MD;  Location: [facility];  Service: General;  Laterality: N/A;   INGUINAL HERNIA REPAIR  1968   PARTIAL HYSTERECTOMY  1996   PORT-A-CATH REMOVAL Right 12/15/2017   Procedure: REMOVAL PORT-A-CATH;  Surgeon: Ebbie Cough, MD;  Location: [facility];  Service: General;  Laterality: Right;   PORTACATH PLACEMENT Right 08/05/2017   Procedure: INSERTION PORT-A-CATH WITH US ;  Surgeon: Ebbie Cough, MD;  Location: [facility];  Service: General;  Laterality: Right;   RECTAL EXAM UNDER ANESTHESIA N/A 07/03/2022   Procedure: RECTAL EXAM UNDER ANESTHESIA;  Surgeon: Teresa Lonni HERO, MD;  Location: [facility];  Service: General;  Laterality: N/A;   SHOULDER SURGERY Right 2005   torn muscle   TONSILLECTOMY     Patient Active Problem List   Diagnosis Date Noted   Osteoporosis 09/22/2023   Poor balance 09/22/2023   Dizziness 09/22/2023   Hypertension 09/21/2023   Chemotherapy induced neutropenia (HCC) 10/05/2017   Thrush, oral 10/05/2017   Port-A-Cath in place 08/23/2017   Genetic testing 08/03/2017   Family history of breast cancer 07/27/2017   Ductal carcinoma in situ (DCIS) of right breast 07/27/2017   Schwannoma of cranial  nerve (HCC), left 07/27/2017   Family history of prostate cancer    Family history of ovarian cancer    Family history of pancreatic cancer    History of colon polyps    Tinnitus of left ear 08/12/2011   Nailbed injury 08/12/2011   Sensorineural hearing loss, unilateral 08/12/2011   Malignant neoplasm of upper-outer quadrant of left breast in female, estrogen receptor negative (HCC) 10/07/2007   ADENOMATOUS COLONIC POLYP 10/07/2007   Hemorrhoids 10/07/2007   GERD 10/07/2007   Gastritis and gastroduodenitis 10/07/2007   Inguinal hernia 10/07/2007   Calculus of gallbladder 10/07/2007   Arthropathy 10/07/2007    PCP: Geofm Glade PARAS,  MD  REFERRING PROVIDER: Vaslow, Zachary K, MD   REFERRING DIAG:  R42 (ICD-10-CM) - Dizziness  D33.3 (ICD-10-CM) - Schwannoma of cranial nerve (HCC)    THERAPY DIAG:  Unsteadiness on feet  Other abnormalities of gait and mobility  Muscle weakness (generalized)  ONSET DATE: 11/08/2023 MD referral  Rationale for Evaluation and Treatment: Rehabilitation  SUBJECTIVE:   SUBJECTIVE STATEMENT: Doing okay.  Still having difficulty with tandem gait. Going down steps (with no rail at home) is still difficult and scary.  Pt accompanied by: self  PERTINENT HISTORY: breast cancer; pt had sudden onset of imbalance, undsteady gait without frank vertigo on 09/08/23.  This was preceded by severe headache, like a migraine which is unusual for her.  She did improve with her gait and balance over the next few days, but without return to prior baseline.  Even now almost two months later, she stays away from driving and is walking often with a cane.  Prior she was driving independently and walking multiple miles per day.  She has vestibular schwannoma that was treated at [facility] in 2020 with radiosurgery.  She continues to have hearing impairment in the left ear, follows with audiology.  Also has had both wrists fractured in past year, bilateral cataracts.   PAIN:  Are you having pain? No  PRECAUTIONS: Fall  RED FLAGS: None   WEIGHT BEARING RESTRICTIONS: No  FALLS: Has patient fallen in last 6 months? No  LIVING ENVIRONMENT: Lives with: lives with their family Lives in: House/apartment Stairs: Steps to enter home, handrails Has following equipment at home: Single point cane  PLOF: Independent; Enjoys walking as exercise  PATIENT GOALS: To get my balance better and walk out the door more steady  OBJECTIVE:     TODAY'S TREATMENT: 12/06/2023 Activity Comments  Reviewed updates to HEP Good return demo  Forward/back step and weightshift On ground x 10 On Airex x 10   Side step and  weightshift: On Airex x 10 On ground x 10, over obstacle   Heel/toe raises  2 x 10 reps   Forward step ups Forward step downs  6 steps, 10 reps 4 steps, 10 reps  Stair negotiation:  step to pattern, no rail (as at home) Cues for RLE leading up and LLE leading down  Wall bumps x 10 Hip strategy      HOME EXERCISE PROGRAM Access Code: [email or web address] URL: [email or web address] Date: 12/06/2023 Prepared by: [facility] - Outpatient  Rehab - [facility]  Program Notes Forward/back step (FOR BALANCE RECOVERY):  Step forward/back, shifting your weight each direction, 10 reps.  Then repeat with your other leg.Side step and weightshift (FOR BALANCE RECOVERY):  Step out to the side, then return to the middle, 10 repsThen repeat with your other leg.STEPS:  Lead UP with your RIGHT leg.  Lead DOWN with  your LEFT leg.  Exercises - Standing Romberg to 1/2 Tandem Stance  - 1 x daily - 7 x weekly - 1 sets - 3 reps - 15 sec hold - Single Leg Stance with Support  - 1 x daily - 7 x weekly - 1 sets - 3 reps - 10 sec hold - Standing Toe Taps  - 1 x daily - 5 x weekly - 2 sets - 10 reps - Squat with Chair Touch  - 1 x daily - 5 x weekly - 2 sets - 10 reps - Side Stepping with Resistance at Thighs and Counter Support  - 1 x daily - 7 x weekly - 1 sets - 3-5 reps - Forward Backward Monster Walk with Band at Thighs and Counter Support  - 1 x daily - 7 x weekly - 1 sets - 3-5 reps - Tandem Walking with Counter Support  - 1 x daily - 7 x weekly - 1 sets - 3-5 reps              PATIENT EDUCATION: Education details: HEP update, stair negotiation strategy with edu for safety Person educated: Patient Education method: Explanation, Demonstration, Tactile cues, Verbal cues, and Handouts Education comprehension: verbalized understanding and returned demonstration    Note: Objective measures were completed at Evaluation unless otherwise noted.  DIAGNOSTIC FINDINGS: No acute intracranial  abnormality-CT 08/2023  COGNITION: Overall cognitive status: Within functional limits for tasks assessed   SENSATION: Light touch: WFL  POSTURE:  rounded shoulders  LOWER EXTREMITY MMT:   MMT Right eval Left eval  Hip flexion 4 4+  Hip abduction 4+ 4+  Hip adduction 5 5  Hip internal rotation    Hip external rotation    Knee flexion 5 5  Knee extension 5 5  Ankle dorsiflexion 4 4  Ankle plantarflexion    Ankle inversion    Ankle eversion    (Blank rows = not tested)  TRANSFERS: Assistive device utilized: None  Sit to stand: Complete Independence Stand to sit: Complete Independence   GAIT: Gait pattern: holds hands together at chest/midline-guarding due to not wanting to fall, step through pattern, decreased arm swing- Left, decreased step length- Right, ataxic, and wide BOS Distance walked: 50 ft Assistive device utilized: None Level of assistance: SBA Comments: Reports she holds hands at chest/midline due to not wanting to lose balance  FUNCTIONAL TESTS:  5 times sit to stand: 12.97 sec 10 meter walk test: 12 sec (2.73 ft/sec) SLS:  2.47 sec LLE, 0.85 sec RLE Tandem: 1.5 sec LLE in back, 1.3 sec RLE in back FGA:  18/30  M-CTSIB  Condition 1: Firm Surface, EO 30 Sec, Normal Sway  Condition 2: Firm Surface, EC 30 Sec, Normal Sway  Condition 3: Foam Surface, EO 30 Sec, Mild Sway  Condition 4: Foam Surface, EC 30 Sec, Mild Sway        VESTIBULAR ASSESSMENT:  GENERAL OBSERVATION: Pt does not report frank dizziness or spinning; main concerns being balance.  Did not proceed with vestibular portion of eval.  TREATMENT DATE: 11/24/2023    PATIENT EDUCATION: Education details: Eval results, POC, Initial HEP Person educated: Patient Education method: Explanation, Demonstration, Verbal cues, and Handouts Education comprehension:  verbalized understanding, returned demonstration, and needs further education  HOME EXERCISE PROGRAM: Access Code: [email or web address] URL: [email or web address] Date: 11/24/2023 Prepared by: [facility] - Outpatient  Rehab - [facility] *Walk with relaxed arm swing in home, 2-3 minutes/several times per day Exercises - Standing Romberg to 1/2 Tandem Stance  - 1 x daily - 7 x weekly - 1 sets - 3 reps - 15 sec hold - Single Leg Stance with Support  - 1 x daily - 7 x weekly - 1 sets - 3 reps - 10 sec hold GOALS: Goals reviewed with patient? Yes  SHORT TERM GOALS: Target date: 12/24/2023  Pt will be independent with HEP for improved balance, gait. Baseline: Goal status: IN PROGRESS  LONG TERM GOALS: Target date: 01/07/2024  Pt will be independent with HEP for improved balance, gait. Baseline:  Goal status: IN PROGRESS  2.  Pt will improve SLS to at least 3 sec each leg for improved gait, obstacle, stair negotiation. Baseline: 1-2 sec Goal status: IN PROGRESS  3.  Pt will improve tandem stance to at least 15 sec each position, for improved balance. Baseline: 1 sec Goal status: IN PROGRESS  4.  Pt will improve FGA score to at least 23/30 to decrease fall risk. Baseline: 18/30 Goal status: IN PROGRESS  5.  Pt will ambulate at least 1000 ft, independent with least restrictive or no device, for improved return to community gait and exercise. Baseline:  Goal status: IN PROGRESS   ASSESSMENT:  CLINICAL IMPRESSION: Pt presents today with no new complaints, other than tandem gait is still hard.  Reviewed HEP updates and pt return demo understanding; she does need light UE support for stability with tandem gait.  Skilled PT session focused on work on balance strategies for fall prevention and stair negotiation, with pt appearing steadier with descending stairs with step to pattern with LLE leading down (instead of RLE).  Pt will continue to benefit from skilled PT towards goals  for improved functional mobility and decreased fall risk.    OBJECTIVE IMPAIRMENTS: Abnormal gait, decreased balance, and decreased strength.   ACTIVITY LIMITATIONS: standing, stairs, locomotion level, and caring for others  PARTICIPATION LIMITATIONS: shopping and community activity  PERSONAL FACTORS: 3+ comorbidities: see above are also affecting patient's functional outcome.   REHAB POTENTIAL: Good  CLINICAL DECISION MAKING: Evolving/moderate complexity  EVALUATION COMPLEXITY: Moderate   PLAN:  PT FREQUENCY: 1-2x/week  PT DURATION: 6 weeks plus eval visit  PLANNED INTERVENTIONS: 97750- Physical Performance Testing, 97110-Therapeutic exercises, 97530- Therapeutic activity, 97112- Neuromuscular re-education, 97535- Self Care, [id number]- Manual therapy, [phone]- Gait training, Patient/Family education, Balance training, and Vestibular training  PLAN FOR NEXT SESSION: Review HEP updates and progress HEP for balance, gait and ankle/hip strength; work on balance strategies, compliant and unlevel surfaces.     Greig Anon, PT 12/06/23 10:16 AM Phone: [phone] Fax: [phone]  [facility] at [facility] [address] [place], [address] [place], [place] [address] Phone # [phone] Fax # [phone]

## 2023-12-09 ENCOUNTER — Encounter: Payer: Self-pay | Admitting: Physical Therapy

## 2023-12-09 ENCOUNTER — Ambulatory Visit: Admitting: Physical Therapy

## 2023-12-09 DIAGNOSIS — R2681 Unsteadiness on feet: Secondary | ICD-10-CM | POA: Diagnosis not present

## 2023-12-09 DIAGNOSIS — R2689 Other abnormalities of gait and mobility: Secondary | ICD-10-CM | POA: Diagnosis not present

## 2023-12-09 DIAGNOSIS — M6281 Muscle weakness (generalized): Secondary | ICD-10-CM

## 2023-12-09 NOTE — Therapy (Addendum)
 OUTPATIENT PHYSICAL THERAPY VESTIBULAR TREATMENT     Patient Name: Stephanie Bautista MRN: [id number] DOB:03-28-1951, 73 y.o., female Today's Date: 12/09/2023  END OF SESSION:  PT End of Session - 12/09/23 0827     Visit Number 5    Number of Visits 13    Date for PT Re-Evaluation 01/07/24    Authorization Type Medicare/[employer]    Progress Note Due on Visit 10    PT Start Time 0835    PT Stop Time 0922    PT Time Calculation (min) 47 min    Equipment Utilized During Treatment Gait belt    Activity Tolerance Patient tolerated treatment well    Behavior During Therapy WFL for tasks assessed/performed              Past Medical History:  Diagnosis Date   Acoustic neuroma (HCC)    Adenomatous colon polyp 12/2002   Arthritis    Breast cancer (HCC) 1996   right breast cancer in 1996, left breast cancer in 2019   Cancer (HCC)    Cholelithiases    Esophageal stricture    Family history of breast cancer    Family history of ovarian cancer    Family history of pancreatic cancer    Family history of prostate cancer    GERD (gastroesophageal reflux disease)    Hemorrhoids    Hiatal hernia    History of colon polyps    History of radiation therapy 02/07/18-03/04/2018   Left Breast/ 40.05 Gy in 15 fractions, Left Breast boost/ 10 Gy in 5 fractions.    Inguinal hernia    UTI (lower urinary tract infection)    Past Surgical History:  Procedure Laterality Date   accoustic neuroma     BREAST LUMPECTOMY     BREAST LUMPECTOMY WITH RADIOACTIVE SEED AND SENTINEL LYMPH NODE BIOPSY Left 12/15/2017   Procedure: LEFT BREAST LUMPECTOMY WITH RADIOACTIVE SEED AND LEFT SENTINEL LYMPH NODE BIOPSY, LEFT BREAST SEED GUIDED EXCISIONAL BIOPSY;  Surgeon: Ebbie Cough, MD;  Location: [facility];  Service: General;  Laterality: Left;   CESAREAN SECTION     2x   CHOLECYSTECTOMY  2008   HEMORRHOID SURGERY  2007   HEMORRHOID SURGERY N/A 07/03/2022   Procedure:  HEMORRHOIDECTOMY;  Surgeon: Teresa Lonni HERO, MD;  Location: [facility];  Service: General;  Laterality: N/A;   INGUINAL HERNIA REPAIR  1968   PARTIAL HYSTERECTOMY  1996   PORT-A-CATH REMOVAL Right 12/15/2017   Procedure: REMOVAL PORT-A-CATH;  Surgeon: Ebbie Cough, MD;  Location: [facility];  Service: General;  Laterality: Right;   PORTACATH PLACEMENT Right 08/05/2017   Procedure: INSERTION PORT-A-CATH WITH US ;  Surgeon: Ebbie Cough, MD;  Location:  SURGERY CENTER;  Service: General;  Laterality: Right;   RECTAL EXAM UNDER ANESTHESIA N/A 07/03/2022   Procedure: RECTAL EXAM UNDER ANESTHESIA;  Surgeon: Teresa Lonni HERO, MD;  Location: [facility];  Service: General;  Laterality: N/A;   SHOULDER SURGERY Right 2005   torn muscle   TONSILLECTOMY     Patient Active Problem List   Diagnosis Date Noted   Osteoporosis 09/22/2023   Poor balance 09/22/2023   Dizziness 09/22/2023   Hypertension 09/21/2023   Chemotherapy induced neutropenia (HCC) 10/05/2017   Thrush, oral 10/05/2017   Port-A-Cath in place 08/23/2017   Genetic testing 08/03/2017   Family history of breast cancer 07/27/2017   Ductal carcinoma in situ (DCIS) of right breast 07/27/2017   Schwannoma of  cranial nerve (HCC), left 07/27/2017   Family history of prostate cancer    Family history of ovarian cancer    Family history of pancreatic cancer    History of colon polyps    Tinnitus of left ear 08/12/2011   Nailbed injury 08/12/2011   Sensorineural hearing loss, unilateral 08/12/2011   Malignant neoplasm of upper-outer quadrant of left breast in female, estrogen receptor negative (HCC) 10/07/2007   ADENOMATOUS COLONIC POLYP 10/07/2007   Hemorrhoids 10/07/2007   GERD 10/07/2007   Gastritis and gastroduodenitis 10/07/2007   Inguinal hernia 10/07/2007   Calculus of gallbladder 10/07/2007   Arthropathy 10/07/2007    PCP: Geofm Glade PARAS,  MD  REFERRING PROVIDER: Vaslow, Zachary K, MD   REFERRING DIAG:  R42 (ICD-10-CM) - Dizziness  D33.3 (ICD-10-CM) - Schwannoma of cranial nerve (HCC)    THERAPY DIAG:  Unsteadiness on feet  Other abnormalities of gait and mobility  Muscle weakness (generalized)  ONSET DATE: 11/08/2023 MD referral  Rationale for Evaluation and Treatment: Rehabilitation  SUBJECTIVE:   SUBJECTIVE STATEMENT: Doing okay.  Some medical things going on with my husband.  Pt accompanied by: self  PERTINENT HISTORY: breast cancer; pt had sudden onset of imbalance, undsteady gait without frank vertigo on 09/08/23.  This was preceded by severe headache, like a migraine which is unusual for her.  She did improve with her gait and balance over the next few days, but without return to prior baseline.  Even now almost two months later, she stays away from driving and is walking often with a cane.  Prior she was driving independently and walking multiple miles per day.  She has vestibular schwannoma that was treated at [facility] in 2020 with radiosurgery.  She continues to have hearing impairment in the left ear, follows with audiology.  Also has had both wrists fractured in past year, bilateral cataracts.   PAIN:  Are you having pain? No  PRECAUTIONS: Fall  RED FLAGS: None   WEIGHT BEARING RESTRICTIONS: No  FALLS: Has patient fallen in last 6 months? No  LIVING ENVIRONMENT: Lives with: lives with their family Lives in: House/apartment Stairs: Steps to enter home, handrails Has following equipment at home: Single point cane  PLOF: Independent; Enjoys walking as exercise  PATIENT GOALS: To get my balance better and walk out the door more steady  OBJECTIVE:    TODAY'S TREATMENT: 12/09/2023 Activity Comments  Review of HEP: Forward/back step and weightshift Side step and weightshift Min cues for technique  Tandem gait Difficulty with LLE as stance  On Airex:   Side step and weightshift Feet  apart/feet together + EO/EC head turns Forward/back step and weightshift   Mild sway  Strenghtening for BLEs: Marching in place x 10 Hip abduction x 10 Hip extension x 10 Hamstring curls x 10 Seated LAQ x 10 3# Cues for form  Single/double/triple step taps to 4'/8 steps, no UE support, for improved SLS 3#  Step ups-forward x 10 Side x 10 Light UE support  Sit to stand 2 x 5 reps Sit to stand with stagger stance 2 x 5 reps Sit to stand on Airex x 5 reps Holding yellow weighted ball Cues for upright posture and to relax arms by sides   HOME EXERCISE PROGRAM Access Code: [email or web address] URL: [email or web address] Date: 12/09/2023 Prepared by: [facility] - Outpatient  Rehab - [facility]  Program Notes Forward/back step (FOR BALANCE RECOVERY):  Step forward/back, shifting your weight each direction, 10 reps.  Then repeat with your  other leg.Side step and weightshift (FOR BALANCE RECOVERY):  Step out to the side, then return to the middle, 10 repsThen repeat with your other leg.STEPS:  Lead UP with your RIGHT leg.  Lead DOWN with your LEFT leg.  Exercises - Standing Romberg to 1/2 Tandem Stance  - 1 x daily - 7 x weekly - 1 sets - 3 reps - 15 sec hold - Single Leg Stance with Support  - 1 x daily - 7 x weekly - 1 sets - 3 reps - 10 sec hold - Standing Toe Taps  - 1 x daily - 5 x weekly - 2 sets - 10 reps - Squat with Chair Touch  - 1 x daily - 5 x weekly - 2 sets - 10 reps - Side Stepping with Resistance at Thighs and Counter Support  - 1 x daily - 7 x weekly - 1 sets - 3-5 reps - Forward Backward Monster Walk with Band at Thighs and Counter Support  - 1 x daily - 7 x weekly - 1 sets - 3-5 reps - Tandem Walking with Counter Support  - 1 x daily - 7 x weekly - 1 sets - 3-5 reps - Standing Hip Abduction with Ankle Weight  - 1 x daily - 3 x weekly - 3 sets - 10 reps - Standing Hip Extension with Ankle Weight  - 1 x daily - 3 x weekly - 3 sets - 10 reps - Standing Knee  Flexion with Ankle Weight  - 1 x daily - 3 x weekly - 3 sets - 10 reps - Standing Hip Flexion with Ankle Weight  - 1 x daily - 3 x weekly - 3 sets - 10 reps - Step Up  - 1 x daily - 3 x weekly - 3 sets - 10 reps - Lateral Step Up  - 1 x daily - 3 x weekly - 3 sets - 10 reps        PATIENT EDUCATION: Education details: HEP update/progression to add strengthening exercises; benefits of looking into ankle weights for home Person educated: Patient Education method: Explanation, Demonstration, Tactile cues, Verbal cues, and Handouts Education comprehension: verbalized understanding and returned demonstration    Note: Objective measures were completed at Evaluation unless otherwise noted.  DIAGNOSTIC FINDINGS: No acute intracranial abnormality-CT 08/2023  COGNITION: Overall cognitive status: Within functional limits for tasks assessed   SENSATION: Light touch: WFL  POSTURE:  rounded shoulders  LOWER EXTREMITY MMT:   MMT Right eval Left eval  Hip flexion 4 4+  Hip abduction 4+ 4+  Hip adduction 5 5  Hip internal rotation    Hip external rotation    Knee flexion 5 5  Knee extension 5 5  Ankle dorsiflexion 4 4  Ankle plantarflexion    Ankle inversion    Ankle eversion    (Blank rows = not tested)  TRANSFERS: Assistive device utilized: None  Sit to stand: Complete Independence Stand to sit: Complete Independence   GAIT: Gait pattern: holds hands together at chest/midline-guarding due to not wanting to fall, step through pattern, decreased arm swing- Left, decreased step length- Right, ataxic, and wide BOS Distance walked: 50 ft Assistive device utilized: None Level of assistance: SBA Comments: Reports she holds hands at chest/midline due to not wanting to lose balance  FUNCTIONAL TESTS:  5 times sit to stand: 12.97 sec 10 meter walk test: 12 sec (2.73 ft/sec) SLS:  2.47 sec LLE, 0.85 sec RLE Tandem: 1.5 sec LLE in back,  1.3 sec RLE in back FGA:   18/30  M-CTSIB  Condition 1: Firm Surface, EO 30 Sec, Normal Sway  Condition 2: Firm Surface, EC 30 Sec, Normal Sway  Condition 3: Foam Surface, EO 30 Sec, Mild Sway  Condition 4: Foam Surface, EC 30 Sec, Mild Sway        VESTIBULAR ASSESSMENT:  GENERAL OBSERVATION: Pt does not report frank dizziness or spinning; main concerns being balance.  Did not proceed with vestibular portion of eval.                                                                                                                             TREATMENT DATE: 11/24/2023    PATIENT EDUCATION: Education details: Eval results, POC, Initial HEP Person educated: Patient Education method: Explanation, Demonstration, Verbal cues, and Handouts Education comprehension: verbalized understanding, returned demonstration, and needs further education  HOME EXERCISE PROGRAM: Access Code: [email or web address] URL: [email or web address] Date: 11/24/2023 Prepared by: [facility] - Outpatient  Rehab - [facility] *Walk with relaxed arm swing in home, 2-3 minutes/several times per day Exercises - Standing Romberg to 1/2 Tandem Stance  - 1 x daily - 7 x weekly - 1 sets - 3 reps - 15 sec hold - Single Leg Stance with Support  - 1 x daily - 7 x weekly - 1 sets - 3 reps - 10 sec hold GOALS: Goals reviewed with patient? Yes  SHORT TERM GOALS: Target date: 12/24/2023  Pt will be independent with HEP for improved balance, gait. Baseline: Goal status: IN PROGRESS  LONG TERM GOALS: Target date: 01/07/2024  Pt will be independent with HEP for improved balance, gait. Baseline:  Goal status: IN PROGRESS  2.  Pt will improve SLS to at least 3 sec each leg for improved gait, obstacle, stair negotiation. Baseline: 1-2 sec Goal status: IN PROGRESS  3.  Pt will improve tandem stance to at least 15 sec each position, for improved balance. Baseline: 1 sec Goal status: IN PROGRESS  4.  Pt will improve FGA score to at least  23/30 to decrease fall risk. Baseline: 18/30 Goal status: IN PROGRESS  5.  Pt will ambulate at least 1000 ft, independent with least restrictive or no device, for improved return to community gait and exercise. Baseline:  Goal status: IN PROGRESS   ASSESSMENT:  CLINICAL IMPRESSION: Pt presents today with reports of not having to use cane as much for outdoor gait. Skilled PT session focused on balance on compliant surfaces as well as hip and BLE strengthening.  With increased demands of tandem or SLS activities, she has unsteadiness, especially with LLE as stance.  Worked on SLS and hip strengthening with use of ankle weights; pt performs well and added strengthening to HEP. Pt needs cues to relax UES and to lightly hold for support for stability.  She has no complaints at end of session. Pt will continue to benefit from skilled PT towards goals for improved functional  mobility and decreased fall risk.   OBJECTIVE IMPAIRMENTS: Abnormal gait, decreased balance, and decreased strength.   ACTIVITY LIMITATIONS: standing, stairs, locomotion level, and caring for others  PARTICIPATION LIMITATIONS: shopping and community activity  PERSONAL FACTORS: 3+ comorbidities: see above are also affecting patient's functional outcome.   REHAB POTENTIAL: Good  CLINICAL DECISION MAKING: Evolving/moderate complexity  EVALUATION COMPLEXITY: Moderate   PLAN:  PT FREQUENCY: 1-2x/week  PT DURATION: 6 weeks plus eval visit  PLANNED INTERVENTIONS: 97750- Physical Performance Testing, 97110-Therapeutic exercises, 97530- Therapeutic activity, [id number]- Neuromuscular re-education, 97535- Self Care, [id number]- Manual therapy, [phone]- Gait training, Patient/Family education, Balance training, and Vestibular training  PLAN FOR NEXT SESSION: *Pt is only scheduled through 7/21, but POC goes farther.  Discuss POC and check goals as needed.*  Review HEP updates ask about ankle weights for home; continue to work on SLS,  ankle/hip strength; work on balance strategies, compliant and unlevel surfaces.     Greig Anon, PT 12/09/23 9:26 AM Phone: [phone] Fax: [phone]  [facility] at [facility] [address] [place], [address] [place], [place] [address] Phone # [phone] Fax # [phone]

## 2023-12-13 ENCOUNTER — Ambulatory Visit

## 2023-12-13 DIAGNOSIS — R2689 Other abnormalities of gait and mobility: Secondary | ICD-10-CM

## 2023-12-13 DIAGNOSIS — R2681 Unsteadiness on feet: Secondary | ICD-10-CM

## 2023-12-13 DIAGNOSIS — M6281 Muscle weakness (generalized): Secondary | ICD-10-CM | POA: Diagnosis not present

## 2023-12-13 NOTE — Therapy (Signed)
 OUTPATIENT PHYSICAL THERAPY VESTIBULAR TREATMENT     Patient Name: Stephanie Bautista MRN: [id number] DOB:[birth year], 73 y.o., female Today's Date: 12/13/2023  END OF SESSION:  PT End of Session - 12/13/23 0749     Visit Number 6    Number of Visits 13    Date for PT Re-Evaluation 01/07/24    Authorization Type Medicare/[employer]    Progress Note Due on Visit 10    PT Start Time 0800    PT Stop Time 0845    PT Time Calculation (min) 45 min    Equipment Utilized During Treatment Gait belt    Activity Tolerance Patient tolerated treatment well    Behavior During Therapy WFL for tasks assessed/performed              Past Medical History:  Diagnosis Date   Acoustic neuroma (HCC)    Adenomatous colon polyp 12/2002   Arthritis    Breast cancer (HCC) 1996   right breast cancer in 1996, left breast cancer in 2019   Cancer (HCC)    Cholelithiases    Esophageal stricture    Family history of breast cancer    Family history of ovarian cancer    Family history of pancreatic cancer    Family history of prostate cancer    GERD (gastroesophageal reflux disease)    Hemorrhoids    Hiatal hernia    History of colon polyps    History of radiation therapy 02/07/18-03/04/2018   Left Breast/ 40.05 Gy in 15 fractions, Left Breast boost/ 10 Gy in 5 fractions.    Inguinal hernia    UTI (lower urinary tract infection)    Past Surgical History:  Procedure Laterality Date   accoustic neuroma     BREAST LUMPECTOMY     BREAST LUMPECTOMY WITH RADIOACTIVE SEED AND SENTINEL LYMPH NODE BIOPSY Left 12/15/2017   Procedure: LEFT BREAST LUMPECTOMY WITH RADIOACTIVE SEED AND LEFT SENTINEL LYMPH NODE BIOPSY, LEFT BREAST SEED GUIDED EXCISIONAL BIOPSY;  Surgeon: Ebbie Cough, MD;  Location: [facility];  Service: General;  Laterality: Left;   CESAREAN SECTION     2x   CHOLECYSTECTOMY  2008   HEMORRHOID SURGERY  2007   HEMORRHOID SURGERY N/A 07/03/2022   Procedure:  HEMORRHOIDECTOMY;  Surgeon: Teresa Lonni HERO, MD;  Location: [facility];  Service: General;  Laterality: N/A;   INGUINAL HERNIA REPAIR  1968   PARTIAL HYSTERECTOMY  1996   PORT-A-CATH REMOVAL Right 12/15/2017   Procedure: REMOVAL PORT-A-CATH;  Surgeon: Ebbie Cough, MD;  Location: [facility];  Service: General;  Laterality: Right;   PORTACATH PLACEMENT Right 08/05/2017   Procedure: INSERTION PORT-A-CATH WITH US ;  Surgeon: Ebbie Cough, MD;  Location:  SURGERY CENTER;  Service: General;  Laterality: Right;   RECTAL EXAM UNDER ANESTHESIA N/A 07/03/2022   Procedure: RECTAL EXAM UNDER ANESTHESIA;  Surgeon: Teresa Lonni HERO, MD;  Location: [facility];  Service: General;  Laterality: N/A;   SHOULDER SURGERY Right 2005   torn muscle   TONSILLECTOMY     Patient Active Problem List   Diagnosis Date Noted   Osteoporosis 09/22/2023   Poor balance 09/22/2023   Dizziness 09/22/2023   Hypertension 09/21/2023   Chemotherapy induced neutropenia (HCC) 10/05/2017   Thrush, oral 10/05/2017   Port-A-Cath in place 08/23/2017   Genetic testing 08/03/2017   Family history of breast cancer 07/27/2017   Ductal carcinoma in situ (DCIS) of right breast 07/27/2017   Schwannoma of  cranial nerve (HCC), left 07/27/2017   Family history of prostate cancer    Family history of ovarian cancer    Family history of pancreatic cancer    History of colon polyps    Tinnitus of left ear 08/12/2011   Nailbed injury 08/12/2011   Sensorineural hearing loss, unilateral 08/12/2011   Malignant neoplasm of upper-outer quadrant of left breast in female, estrogen receptor negative (HCC) 10/07/2007   ADENOMATOUS COLONIC POLYP 10/07/2007   Hemorrhoids 10/07/2007   GERD 10/07/2007   Gastritis and gastroduodenitis 10/07/2007   Inguinal hernia 10/07/2007   Calculus of gallbladder 10/07/2007   Arthropathy 10/07/2007    PCP: Geofm Glade PARAS,  MD  REFERRING PROVIDER: Vaslow, Zachary K, MD   REFERRING DIAG:  R42 (ICD-10-CM) - Dizziness  D33.3 (ICD-10-CM) - Schwannoma of cranial nerve (HCC)    THERAPY DIAG:  Unsteadiness on feet  Other abnormalities of gait and mobility  Muscle weakness (generalized)  ONSET DATE: 11/08/2023 MD referral  Rationale for Evaluation and Treatment: Rehabilitation  SUBJECTIVE:   SUBJECTIVE STATEMENT: Still having to look after the husband. Trying to walk heel to toe but it is very difficult. Have already walked 10,000 steps  Pt accompanied by: self  PERTINENT HISTORY: breast cancer; pt had sudden onset of imbalance, undsteady gait without frank vertigo on 09/08/23.  This was preceded by severe headache, like a migraine which is unusual for her.  She did improve with her gait and balance over the next few days, but without return to prior baseline.  Even now almost two months later, she stays away from driving and is walking often with a cane.  Prior she was driving independently and walking multiple miles per day.  She has vestibular schwannoma that was treated at [facility] in 2020 with radiosurgery.  She continues to have hearing impairment in the left ear, follows with audiology.  Also has had both wrists fractured in past year, bilateral cataracts.   PAIN:  Are you having pain? No  PRECAUTIONS: Fall  RED FLAGS: None   WEIGHT BEARING RESTRICTIONS: No  FALLS: Has patient fallen in last 6 months? No  LIVING ENVIRONMENT: Lives with: lives with their family Lives in: House/apartment Stairs: Steps to enter home, handrails Has following equipment at home: Single point cane  PLOF: Independent; Enjoys walking as exercise  PATIENT GOALS: To get my balance better and walk out the door more steady  OBJECTIVE:   TODAY'S TREATMENT: 12/13/23 Activity Comments  Discussion/demo/trial of dynamic gait activities E.g. gripping stress ball, vertical ball toss for eye hand coordination. Walking  with sustained head rotation or extension  Sit to stand alt knee tap 2x10   3-way hip and hamstring curls 2x10 4#--to sequence LE strength program to promote fluidity and coordination, (experiencing popping in right hip w/ flexion to extension--not painful)  Static multisensory balance on foam x 30 sec Eyes open/closed. Head movements EO/EC. Postural perturbations EO/EC  Ambulation w/ obstacles -For challenge to single limb support for stepping over obstacles -retrowalk navigating around obstacles  4-square step With and without obstacles.      TODAY'S TREATMENT: 12/09/2023 Activity Comments  Review of HEP: Forward/back step and weightshift Side step and weightshift Min cues for technique  Tandem gait Difficulty with LLE as stance  On Airex:   Side step and weightshift Feet apart/feet together + EO/EC head turns Forward/back step and weightshift   Mild sway  Strenghtening for BLEs: Marching in place x 10 Hip abduction x 10 Hip extension x 10  Hamstring curls x 10 Seated LAQ x 10 3# Cues for form  Single/double/triple step taps to 4'/8 steps, no UE support, for improved SLS 3#  Step ups-forward x 10 Side x 10 Light UE support  Sit to stand 2 x 5 reps Sit to stand with stagger stance 2 x 5 reps Sit to stand on Airex x 5 reps Holding yellow weighted ball Cues for upright posture and to relax arms by sides   HOME EXERCISE PROGRAM Access Code: [email or web address] URL: [email or web address] Date: 12/09/2023 Prepared by: [facility] - Outpatient  Rehab - [facility]  Program Notes Forward/back step (FOR BALANCE RECOVERY):  Step forward/back, shifting your weight each direction, 10 reps.  Then repeat with your other leg.Side step and weightshift (FOR BALANCE RECOVERY):  Step out to the side, then return to the middle, 10 repsThen repeat with your other leg.STEPS:  Lead UP with your RIGHT leg.  Lead DOWN with your LEFT leg.  Exercises - Standing Romberg to 1/2 Tandem Stance   - 1 x daily - 7 x weekly - 1 sets - 3 reps - 15 sec hold - Single Leg Stance with Support  - 1 x daily - 7 x weekly - 1 sets - 3 reps - 10 sec hold - Standing Toe Taps  - 1 x daily - 5 x weekly - 2 sets - 10 reps - Squat with Chair Touch  - 1 x daily - 5 x weekly - 2 sets - 10 reps - Side Stepping with Resistance at Thighs and Counter Support  - 1 x daily - 7 x weekly - 1 sets - 3-5 reps - Forward Backward Monster Walk with Band at Thighs and Counter Support  - 1 x daily - 7 x weekly - 1 sets - 3-5 reps - Tandem Walking with Counter Support  - 1 x daily - 7 x weekly - 1 sets - 3-5 reps - Standing Hip Abduction with Ankle Weight  - 1 x daily - 3 x weekly - 3 sets - 10 reps - Standing Hip Extension with Ankle Weight  - 1 x daily - 3 x weekly - 3 sets - 10 reps - Standing Knee Flexion with Ankle Weight  - 1 x daily - 3 x weekly - 3 sets - 10 reps - Standing Hip Flexion with Ankle Weight  - 1 x daily - 3 x weekly - 3 sets - 10 reps - Step Up  - 1 x daily - 3 x weekly - 3 sets - 10 reps - Lateral Step Up  - 1 x daily - 3 x weekly - 3 sets - 10 reps        PATIENT EDUCATION: Education details: HEP update/progression to add strengthening exercises; benefits of looking into ankle weights for home Person educated: Patient Education method: Explanation, Demonstration, Tactile cues, Verbal cues, and Handouts Education comprehension: verbalized understanding and returned demonstration    Note: Objective measures were completed at Evaluation unless otherwise noted.  DIAGNOSTIC FINDINGS: No acute intracranial abnormality-CT 08/2023  COGNITION: Overall cognitive status: Within functional limits for tasks assessed   SENSATION: Light touch: WFL  POSTURE:  rounded shoulders  LOWER EXTREMITY MMT:   MMT Right eval Left eval  Hip flexion 4 4+  Hip abduction 4+ 4+  Hip adduction 5 5  Hip internal rotation    Hip external rotation    Knee flexion 5 5  Knee extension 5 5  Ankle  dorsiflexion 4 4  Ankle plantarflexion  Ankle inversion    Ankle eversion    (Blank rows = not tested)  TRANSFERS: Assistive device utilized: None  Sit to stand: Complete Independence Stand to sit: Complete Independence   GAIT: Gait pattern: holds hands together at chest/midline-guarding due to not wanting to fall, step through pattern, decreased arm swing- Left, decreased step length- Right, ataxic, and wide BOS Distance walked: 50 ft Assistive device utilized: None Level of assistance: SBA Comments: Reports she holds hands at chest/midline due to not wanting to lose balance  FUNCTIONAL TESTS:  5 times sit to stand: 12.97 sec 10 meter walk test: 12 sec (2.73 ft/sec) SLS:  2.47 sec LLE, 0.85 sec RLE Tandem: 1.5 sec LLE in back, 1.3 sec RLE in back FGA:  18/30  M-CTSIB  Condition 1: Firm Surface, EO 30 Sec, Normal Sway  Condition 2: Firm Surface, EC 30 Sec, Normal Sway  Condition 3: Foam Surface, EO 30 Sec, Mild Sway  Condition 4: Foam Surface, EC 30 Sec, Mild Sway        VESTIBULAR ASSESSMENT:  GENERAL OBSERVATION: Pt does not report frank dizziness or spinning; main concerns being balance.  Did not proceed with vestibular portion of eval.                                                                                                                             TREATMENT DATE: 11/24/2023    PATIENT EDUCATION: Education details: Eval results, POC, Initial HEP Person educated: Patient Education method: Explanation, Demonstration, Verbal cues, and Handouts Education comprehension: verbalized understanding, returned demonstration, and needs further education  HOME EXERCISE PROGRAM: Access Code: [email or web address] URL: [email or web address] Date: 11/24/2023 Prepared by: [facility] - Outpatient  Rehab - [facility] *Walk with relaxed arm swing in home, 2-3 minutes/several times per day Exercises - Standing Romberg to 1/2 Tandem Stance  - 1 x daily - 7 x  weekly - 1 sets - 3 reps - 15 sec hold - Single Leg Stance with Support  - 1 x daily - 7 x weekly - 1 sets - 3 reps - 10 sec hold GOALS: Goals reviewed with patient? Yes  SHORT TERM GOALS: Target date: 12/24/2023  Pt will be independent with HEP for improved balance, gait. Baseline: Goal status: IN PROGRESS  LONG TERM GOALS: Target date: 01/07/2024  Pt will be independent with HEP for improved balance, gait. Baseline:  Goal status: IN PROGRESS  2.  Pt will improve SLS to at least 3 sec each leg for improved gait, obstacle, stair negotiation. Baseline: 1-2 sec Goal status: IN PROGRESS  3.  Pt will improve tandem stance to at least 15 sec each position, for improved balance. Baseline: 1 sec Goal status: IN PROGRESS  4.  Pt will improve FGA score to at least 23/30 to decrease fall risk. Baseline: 18/30 Goal status: IN PROGRESS  5.  Pt will ambulate at least 1000 ft, independent with least restrictive or no device, for improved return to  community gait and exercise. Baseline:  Goal status: IN PROGRESS   ASSESSMENT:  CLINICAL IMPRESSION: Provided examples of techniques to improve coordination and balance with her walking program as she likes to exercise her hands while walking due to stiffness, provided examples of activities to address her hand/grip strength/flexibility while walking such as stress ball squeeze and adding throwing/catching for coordination challenge and multi-tasking. Provided example of combining leg strength activities to sequence for improved coordination and fluidity to movement under resistance. Static and dynamic balance challenges to improve single limb support and negotiation of obstacles and to facilitate reactive and proactive balance strategies w/ good tolerance.  Demo mild sway under multisensory conditions and righting reactions present but delayed to larger amplitude perturbations. Continued sessions to progess POC details to improve mobility and reduce  risk for falls.  OBJECTIVE IMPAIRMENTS: Abnormal gait, decreased balance, and decreased strength.   ACTIVITY LIMITATIONS: standing, stairs, locomotion level, and caring for others  PARTICIPATION LIMITATIONS: shopping and community activity  PERSONAL FACTORS: 3+ comorbidities: see above are also affecting patient's functional outcome.   REHAB POTENTIAL: Good  CLINICAL DECISION MAKING: Evolving/moderate complexity  EVALUATION COMPLEXITY: Moderate   PLAN:  PT FREQUENCY: 1-2x/week  PT DURATION: 6 weeks plus eval visit  PLANNED INTERVENTIONS: 97750- Physical Performance Testing, 97110-Therapeutic exercises, 97530- Therapeutic activity, 97112- Neuromuscular re-education, 97535- Self Care, [id number]- Manual therapy, [phone]- Gait training, Patient/Family education, Balance training, and Vestibular training  PLAN FOR NEXT SESSION:   Review HEP updates ask about ankle weights for home; continue to work on SLS, ankle/hip strength; work on balance strategies, compliant and unlevel surfaces.    8:56 AM, 12/13/23 M. Kelly Toryn Mcclinton, PT, DPT Physical Therapist- [place] Office Number: [phone]

## 2023-12-15 ENCOUNTER — Ambulatory Visit (INDEPENDENT_AMBULATORY_CARE_PROVIDER_SITE_OTHER)
Admission: RE | Admit: 2023-12-15 | Discharge: 2023-12-15 | Disposition: A | Discharge status: Home / Self Care | Source: Ambulatory Visit | Attending: Internal Medicine | Admitting: Internal Medicine

## 2023-12-15 DIAGNOSIS — Z4789 Encounter for other orthopedic aftercare: Secondary | ICD-10-CM | POA: Diagnosis not present

## 2023-12-15 DIAGNOSIS — M19041 Primary osteoarthritis, right hand: Secondary | ICD-10-CM | POA: Insufficient documentation

## 2023-12-15 DIAGNOSIS — M180 Bilateral primary osteoarthritis of first carpometacarpal joints: Secondary | ICD-10-CM | POA: Diagnosis not present

## 2023-12-15 DIAGNOSIS — G90519 Complex regional pain syndrome I of unspecified upper limb: Secondary | ICD-10-CM | POA: Diagnosis not present

## 2023-12-15 DIAGNOSIS — Z780 Asymptomatic menopausal state: Secondary | ICD-10-CM | POA: Diagnosis not present

## 2023-12-15 DIAGNOSIS — S52501D Unspecified fracture of the lower end of right radius, subsequent encounter for closed fracture with routine healing: Secondary | ICD-10-CM | POA: Diagnosis not present

## 2023-12-15 DIAGNOSIS — M7502 Adhesive capsulitis of left shoulder: Secondary | ICD-10-CM | POA: Diagnosis not present

## 2023-12-15 DIAGNOSIS — M19042 Primary osteoarthritis, left hand: Secondary | ICD-10-CM | POA: Diagnosis not present

## 2023-12-15 DIAGNOSIS — M25531 Pain in right wrist: Secondary | ICD-10-CM | POA: Diagnosis not present

## 2023-12-16 ENCOUNTER — Ambulatory Visit: Payer: Self-pay | Admitting: Internal Medicine

## 2023-12-20 ENCOUNTER — Ambulatory Visit

## 2023-12-20 DIAGNOSIS — M6281 Muscle weakness (generalized): Secondary | ICD-10-CM | POA: Diagnosis not present

## 2023-12-20 DIAGNOSIS — R2681 Unsteadiness on feet: Secondary | ICD-10-CM | POA: Diagnosis not present

## 2023-12-20 DIAGNOSIS — R2689 Other abnormalities of gait and mobility: Secondary | ICD-10-CM

## 2023-12-20 NOTE — Therapy (Signed)
 OUTPATIENT PHYSICAL THERAPY VESTIBULAR TREATMENT     Patient Name: Stephanie Bautista MRN: [id number] DOB:1951-03-21, 73 y.o., female Today's Date: 12/20/2023  END OF SESSION:  PT End of Session - 12/20/23 0845     Visit Number 7    Number of Visits 13    Date for PT Re-Evaluation 01/07/24    Authorization Type Medicare/[employer]    Progress Note Due on Visit 10    PT Start Time 0845    PT Stop Time 0930    PT Time Calculation (min) 45 min    Equipment Utilized During Treatment Gait belt    Activity Tolerance Patient tolerated treatment well    Behavior During Therapy WFL for tasks assessed/performed              Past Medical History:  Diagnosis Date   Acoustic neuroma (HCC)    Adenomatous colon polyp 12/2002   Arthritis    Breast cancer (HCC) 1996   right breast cancer in 1996, left breast cancer in 2019   Cancer (HCC)    Cholelithiases    Esophageal stricture    Family history of breast cancer    Family history of ovarian cancer    Family history of pancreatic cancer    Family history of prostate cancer    GERD (gastroesophageal reflux disease)    Hemorrhoids    Hiatal hernia    History of colon polyps    History of radiation therapy 02/07/18-03/04/2018   Left Breast/ 40.05 Gy in 15 fractions, Left Breast boost/ 10 Gy in 5 fractions.    Inguinal hernia    UTI (lower urinary tract infection)    Past Surgical History:  Procedure Laterality Date   accoustic neuroma     BREAST LUMPECTOMY     BREAST LUMPECTOMY WITH RADIOACTIVE SEED AND SENTINEL LYMPH NODE BIOPSY Left 12/15/2017   Procedure: LEFT BREAST LUMPECTOMY WITH RADIOACTIVE SEED AND LEFT SENTINEL LYMPH NODE BIOPSY, LEFT BREAST SEED GUIDED EXCISIONAL BIOPSY;  Surgeon: Ebbie Cough, MD;  Location: [facility];  Service: General;  Laterality: Left;   CESAREAN SECTION     2x   CHOLECYSTECTOMY  2008   HEMORRHOID SURGERY  2007   HEMORRHOID SURGERY N/A 07/03/2022   Procedure:  HEMORRHOIDECTOMY;  Surgeon: Teresa Lonni HERO, MD;  Location: [facility];  Service: General;  Laterality: N/A;   INGUINAL HERNIA REPAIR  1968   PARTIAL HYSTERECTOMY  1996   PORT-A-CATH REMOVAL Right 12/15/2017   Procedure: REMOVAL PORT-A-CATH;  Surgeon: Ebbie Cough, MD;  Location: [facility];  Service: General;  Laterality: Right;   PORTACATH PLACEMENT Right 08/05/2017   Procedure: INSERTION PORT-A-CATH WITH US ;  Surgeon: Ebbie Cough, MD;  Location: [facility];  Service: General;  Laterality: Right;   RECTAL EXAM UNDER ANESTHESIA N/A 07/03/2022   Procedure: RECTAL EXAM UNDER ANESTHESIA;  Surgeon: Teresa Lonni HERO, MD;  Location: [facility];  Service: General;  Laterality: N/A;   SHOULDER SURGERY Right 2005   torn muscle   TONSILLECTOMY     Patient Active Problem List   Diagnosis Date Noted   Osteoporosis 09/22/2023   Poor balance 09/22/2023   Dizziness 09/22/2023   Hypertension 09/21/2023   Chemotherapy induced neutropenia (HCC) 10/05/2017   Thrush, oral 10/05/2017   Port-A-Cath in place 08/23/2017   Genetic testing 08/03/2017   Family history of breast cancer 07/27/2017   Ductal carcinoma in situ (DCIS) of right breast 07/27/2017   Schwannoma of  cranial nerve (HCC), left 07/27/2017   Family history of prostate cancer    Family history of ovarian cancer    Family history of pancreatic cancer    History of colon polyps    Tinnitus of left ear 08/12/2011   Nailbed injury 08/12/2011   Sensorineural hearing loss, unilateral 08/12/2011   Malignant neoplasm of upper-outer quadrant of left breast in female, estrogen receptor negative (HCC) 10/07/2007   ADENOMATOUS COLONIC POLYP 10/07/2007   Hemorrhoids 10/07/2007   GERD 10/07/2007   Gastritis and gastroduodenitis 10/07/2007   Inguinal hernia 10/07/2007   Calculus of gallbladder 10/07/2007   Arthropathy 10/07/2007    PCP: Geofm Glade PARAS,  MD  REFERRING PROVIDER: Vaslow, Zachary K, MD   REFERRING DIAG:  R42 (ICD-10-CM) - Dizziness  D33.3 (ICD-10-CM) - Schwannoma of cranial nerve (HCC)    THERAPY DIAG:  Unsteadiness on feet  Other abnormalities of gait and mobility  Muscle weakness (generalized)  ONSET DATE: 11/08/2023 MD referral  Rationale for Evaluation and Treatment: Rehabilitation  SUBJECTIVE:   SUBJECTIVE STATEMENT: Doing ok. Going up/down the steps at home seems to be the most problematic  Pt accompanied by: self  PERTINENT HISTORY: breast cancer; pt had sudden onset of imbalance, undsteady gait without frank vertigo on 09/08/23.  This was preceded by severe headache, like a migraine which is unusual for her.  She did improve with her gait and balance over the next few days, but without return to prior baseline.  Even now almost two months later, she stays away from driving and is walking often with a cane.  Prior she was driving independently and walking multiple miles per day.  She has vestibular schwannoma that was treated at [facility] in 2020 with radiosurgery.  She continues to have hearing impairment in the left ear, follows with audiology.  Also has had both wrists fractured in past year, bilateral cataracts.   PAIN:  Are you having pain? No  PRECAUTIONS: Fall  RED FLAGS: None   WEIGHT BEARING RESTRICTIONS: No  FALLS: Has patient fallen in last 6 months? No  LIVING ENVIRONMENT: Lives with: lives with their family Lives in: House/apartment Stairs: Steps to enter home, handrails Has following equipment at home: Single point cane  PLOF: Independent; Enjoys walking as exercise  PATIENT GOALS: To get my balance better and walk out the door more steady  OBJECTIVE:   TODAY'S TREATMENT: 12/20/23 Activity Comments  Stair training Cues for knee extension-pt suggests possible depth perception issue from cataracts  Sit-stand alt knee tap 1x10 3#  Standing hip abd 2x10 3#  Standing hamstring  curls 2x10 3#  Standing hip flex 2x10 3#  Stair taps 2x10 3#  Monster walk forwards/backwards x 2 min 3#  Static balance -rocker board x 2 min: ea direction -single leg stance: 9 sec RLE, 5 sec LLE -multisensory static balance     TODAY'S TREATMENT: 12/13/23 Activity Comments  Discussion/demo/trial of dynamic gait activities E.g. gripping stress ball, vertical ball toss for eye hand coordination. Walking with sustained head rotation or extension  Sit to stand alt knee tap 2x10   3-way hip and hamstring curls 2x10 4#--to sequence LE strength program to promote fluidity and coordination, (experiencing popping in right hip w/ flexion to extension--not painful)  Static multisensory balance on foam x 30 sec Eyes open/closed. Head movements EO/EC. Postural perturbations EO/EC  Ambulation w/ obstacles -For challenge to single limb support for stepping over obstacles -retrowalk navigating around obstacles  4-square step With and without obstacles.  TODAY'S TREATMENT: 12/09/2023 Activity Comments  Review of HEP: Forward/back step and weightshift Side step and weightshift Min cues for technique  Tandem gait Difficulty with LLE as stance  On Airex:   Side step and weightshift Feet apart/feet together + EO/EC head turns Forward/back step and weightshift   Mild sway  Strenghtening for BLEs: Marching in place x 10 Hip abduction x 10 Hip extension x 10 Hamstring curls x 10 Seated LAQ x 10 3# Cues for form  Single/double/triple step taps to 4'/8 steps, no UE support, for improved SLS 3#  Step ups-forward x 10 Side x 10 Light UE support  Sit to stand 2 x 5 reps Sit to stand with stagger stance 2 x 5 reps Sit to stand on Airex x 5 reps Holding yellow weighted ball Cues for upright posture and to relax arms by sides   HOME EXERCISE PROGRAM Access Code: [email or web address] URL: [email or web address] Date: 12/09/2023 Prepared by: [facility] - Outpatient  Rehab - [facility]  Program Notes Forward/back step (FOR BALANCE RECOVERY):  Step forward/back, shifting your weight each direction, 10 reps.  Then repeat with your other leg.Side step and weightshift (FOR BALANCE RECOVERY):  Step out to the side, then return to the middle, 10 repsThen repeat with your other leg.STEPS:  Lead UP with your RIGHT leg.  Lead DOWN with your LEFT leg.  Exercises - Standing Romberg to 1/2 Tandem Stance  - 1 x daily - 7 x weekly - 1 sets - 3 reps - 15 sec hold - Single Leg Stance with Support  - 1 x daily - 7 x weekly - 1 sets - 3 reps - 10 sec hold - Standing Toe Taps  - 1 x daily - 5 x weekly - 2 sets - 10 reps - Squat with Chair Touch  - 1 x daily - 5 x weekly - 2 sets - 10 reps - Side Stepping with Resistance at Thighs and Counter Support  - 1 x daily - 7 x weekly - 1 sets - 3-5 reps - Forward Backward Monster Walk with Band at Thighs and Counter Support  - 1 x daily - 7 x weekly - 1 sets - 3-5 reps - Tandem Walking with Counter Support  - 1 x daily - 7 x weekly - 1 sets - 3-5 reps - Standing Hip Abduction with Ankle Weight  - 1 x daily - 3 x weekly - 3 sets - 10 reps - Standing Hip Extension with Ankle Weight  - 1 x daily - 3 x weekly - 3 sets - 10 reps - Standing Knee Flexion with Ankle Weight  - 1 x daily - 3 x weekly - 3 sets - 10 reps - Standing Hip Flexion with Ankle Weight  - 1 x daily - 3 x weekly - 3 sets - 10 reps - Step Up  - 1 x daily - 3 x weekly - 3 sets - 10 reps - Lateral Step Up  - 1 x daily - 3 x weekly - 3 sets - 10 reps        PATIENT EDUCATION: Education details: HEP update/progression to add strengthening exercises; benefits of looking into ankle weights for home Person educated: Patient Education method: Explanation, Demonstration, Tactile cues, Verbal cues, and Handouts Education comprehension: verbalized understanding and returned demonstration    Note: Objective measures were completed at Evaluation unless otherwise noted.  DIAGNOSTIC  FINDINGS: No acute intracranial abnormality-CT 08/2023  COGNITION: Overall cognitive status: Within functional limits for  tasks assessed   SENSATION: Light touch: WFL  POSTURE:  rounded shoulders  LOWER EXTREMITY MMT:   MMT Right eval Left eval  Hip flexion 4 4+  Hip abduction 4+ 4+  Hip adduction 5 5  Hip internal rotation    Hip external rotation    Knee flexion 5 5  Knee extension 5 5  Ankle dorsiflexion 4 4  Ankle plantarflexion    Ankle inversion    Ankle eversion    (Blank rows = not tested)  TRANSFERS: Assistive device utilized: None  Sit to stand: Complete Independence Stand to sit: Complete Independence   GAIT: Gait pattern: holds hands together at chest/midline-guarding due to not wanting to fall, step through pattern, decreased arm swing- Left, decreased step length- Right, ataxic, and wide BOS Distance walked: 50 ft Assistive device utilized: None Level of assistance: SBA Comments: Reports she holds hands at chest/midline due to not wanting to lose balance  FUNCTIONAL TESTS:  5 times sit to stand: 12.97 sec 10 meter walk test: 12 sec (2.73 ft/sec) SLS:  2.47 sec LLE, 0.85 sec RLE Tandem: 1.5 sec LLE in back, 1.3 sec RLE in back FGA:  18/30  M-CTSIB  Condition 1: Firm Surface, EO 30 Sec, Normal Sway  Condition 2: Firm Surface, EC 30 Sec, Normal Sway  Condition 3: Foam Surface, EO 30 Sec, Mild Sway  Condition 4: Foam Surface, EC 30 Sec, Mild Sway        VESTIBULAR ASSESSMENT:  GENERAL OBSERVATION: Pt does not report frank dizziness or spinning; main concerns being balance.  Did not proceed with vestibular portion of eval.                                                                                                                             TREATMENT DATE: 11/24/2023    PATIENT EDUCATION: Education details: Eval results, POC, Initial HEP Person educated: Patient Education method: Explanation, Demonstration, Verbal cues, and  Handouts Education comprehension: verbalized understanding, returned demonstration, and needs further education  HOME EXERCISE PROGRAM: Access Code: [email or web address] URL: [email or web address] Date: 11/24/2023 Prepared by: [facility] - Outpatient  Rehab - [facility] *Walk with relaxed arm swing in home, 2-3 minutes/several times per day Exercises - Standing Romberg to 1/2 Tandem Stance  - 1 x daily - 7 x weekly - 1 sets - 3 reps - 15 sec hold - Single Leg Stance with Support  - 1 x daily - 7 x weekly - 1 sets - 3 reps - 10 sec hold GOALS: Goals reviewed with patient? Yes  SHORT TERM GOALS: Target date: 12/24/2023  Pt will be independent with HEP for improved balance, gait. Baseline: Goal status: MET  LONG TERM GOALS: Target date: 01/07/2024  Pt will be independent with HEP for improved balance, gait. Baseline:  Goal status: IN PROGRESS  2.  Pt will improve SLS to at least 3 sec each leg for improved gait, obstacle, stair negotiation. Baseline: 1-2 sec Goal  status: IN PROGRESS  3.  Pt will improve tandem stance to at least 15 sec each position, for improved balance. Baseline: 1 sec Goal status: IN PROGRESS  4.  Pt will improve FGA score to at least 23/30 to decrease fall risk. Baseline: 18/30 Goal status: IN PROGRESS  5.  Pt will ambulate at least 1000 ft, independent with least restrictive or no device, for improved return to community gait and exercise. Baseline:  Goal status: IN PROGRESS   ASSESSMENT:  CLINICAL IMPRESSION: Review to HEP with excellent recall and use of 3# weights for resistance. Continued with dynamic and static balance demands to facilitate righting reactions via proactive and reactive strategies. Difficulty with single limb stance but marked improvement from baseline. Increased sway with multisensory conditions but able to adapt appropriately despite moderate sway.  Pt has upcoming cataract surgery and requests hold at this time and is also  wondering if her impaired depth perception is the cause for difficulty with stair negotiation.  Will hold sessions at this time pending her post-op instructions.    OBJECTIVE IMPAIRMENTS: Abnormal gait, decreased balance, and decreased strength.   ACTIVITY LIMITATIONS: standing, stairs, locomotion level, and caring for others  PARTICIPATION LIMITATIONS: shopping and community activity  PERSONAL FACTORS: 3+ comorbidities: see above are also affecting patient's functional outcome.   REHAB POTENTIAL: Good  CLINICAL DECISION MAKING: Evolving/moderate complexity  EVALUATION COMPLEXITY: Moderate   PLAN:  PT FREQUENCY: 1-2x/week  PT DURATION: 6 weeks plus eval visit  PLANNED INTERVENTIONS: 97750- Physical Performance Testing, 97110-Therapeutic exercises, 97530- Therapeutic activity, [id number]- Neuromuscular re-education, 97535- Self Care, [id number]- Manual therapy, [id number]- Gait training, Patient/Family education, Balance training, and Vestibular training  PLAN FOR NEXT SESSION:   Pt requests hold PT sessions until after cataract surgery    8:45 AM, 12/20/23 M. Kelly Yossi Hinchman, PT, DPT Physical Therapist- [place] Office Number: [phone]

## 2023-12-27 ENCOUNTER — Ambulatory Visit

## 2024-01-03 ENCOUNTER — Inpatient Hospital Stay: Attending: Hematology and Oncology | Admitting: Internal Medicine

## 2024-01-03 VITALS — BP 156/75 | HR 76 | Temp 97.5°F | Resp 20 | Wt 129.8 lb

## 2024-01-03 DIAGNOSIS — Z803 Family history of malignant neoplasm of breast: Secondary | ICD-10-CM | POA: Diagnosis not present

## 2024-01-03 DIAGNOSIS — Z807 Family history of other malignant neoplasms of lymphoid, hematopoietic and related tissues: Secondary | ICD-10-CM | POA: Diagnosis not present

## 2024-01-03 DIAGNOSIS — Z800 Family history of malignant neoplasm of digestive organs: Secondary | ICD-10-CM | POA: Insufficient documentation

## 2024-01-03 DIAGNOSIS — Z808 Family history of malignant neoplasm of other organs or systems: Secondary | ICD-10-CM | POA: Insufficient documentation

## 2024-01-03 DIAGNOSIS — Z853 Personal history of malignant neoplasm of breast: Secondary | ICD-10-CM | POA: Insufficient documentation

## 2024-01-03 DIAGNOSIS — D333 Benign neoplasm of cranial nerves: Secondary | ICD-10-CM | POA: Diagnosis not present

## 2024-01-03 DIAGNOSIS — Z9221 Personal history of antineoplastic chemotherapy: Secondary | ICD-10-CM | POA: Diagnosis not present

## 2024-01-03 DIAGNOSIS — Z8041 Family history of malignant neoplasm of ovary: Secondary | ICD-10-CM | POA: Diagnosis not present

## 2024-01-03 DIAGNOSIS — Z8042 Family history of malignant neoplasm of prostate: Secondary | ICD-10-CM | POA: Diagnosis not present

## 2024-01-03 DIAGNOSIS — Z86000 Personal history of in-situ neoplasm of breast: Secondary | ICD-10-CM | POA: Diagnosis not present

## 2024-01-03 DIAGNOSIS — Z923 Personal history of irradiation: Secondary | ICD-10-CM | POA: Insufficient documentation

## 2024-01-03 NOTE — Progress Notes (Signed)
 [facility] [address]  [place], [place] [address] [phone]   Interval Evaluation  Date of Service: 01/03/24 Patient Name: Stephanie Bautista Patient MRN: [id number] Patient DOB: 03-16-1951 Provider: Arthea MARLA Manns, MD  Identifying Statement:  Stephanie Bautista is a 73 y.o. female with Schwannoma of cranial nerve (HCC), left who presents for initial consultation and evaluation regarding cancer associated neurologic deficits.    Referring Provider: Geofm Glade PARAS, MD [address] [place],  [place] [address]  Primary Cancer:  Oncologic History: Oncology History  Malignant neoplasm of upper-outer quadrant of left breast in female, estrogen receptor negative (HCC)  09/02/1994 Surgery   status post right lumpectomy 09/02/1994 for ductal carcinoma in situ             (a) status post adjuvant radiation 09/21/1994 through 11/04/1994, total 5040 centigrade to the breast and 604 0 cGy to the tumor bed             (b) did not receive adjuvant antiestrogens   07/22/2017 Initial Biopsy    status post left breast upper outer quadrant biopsy for a clinical T1c pN0, stage IB invasive ductal carcinoma, grade 3, triple negative, with an MIB-1 of 50%.             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  The Multi-Gene Panel offered by [facility] includes sequencing and/or deletion duplication testing of the following 83 genes: ALK, APC, ATM, AXIN2,BAP1,  BARD1, BLM, BMPR1A, BRCA1, BRCA2, BRIP1, CASR, CDC73, CDH1, CDK4, CDKN1B, CDKN1C, CDKN2A (p14ARF), CDKN2A (p16INK4a), CEBPA,  CHEK2, CTNNA1, DICER1, DIS3L2, EGFR (c.2369C>T, p.Thr790Met variant only), EPCAM (Deletion/duplication testing only), FH, FLCN, GATA2, GPC3, GREM1 (Promoter region deletion/duplication testing only), HOXB13 (c.251G>A, p.Gly84Glu), HRAS, KIT, MAX, MEN1, MET, MITF (c.952G>A, p.Glu318Lys variant only), MLH1, MSH2, MSH3, MSH6, MUTYH, NBN, NF1, NF2, NTHL1, PALB2, PDGFRA, PHOX2B, PMS2, POLD1, POLE, POT1, PRKAR1A, PTCH1, PTEN, RAD50, RAD51C, RAD51D, RB1, RECQL4, RET, RUNX1, SDHAF2, SDHA (sequence changes only), SDHB, SDHC, SDHD, SMAD4, SMARCA4, SMARCB1, SMARCE1, STK11, SUFU, TERT, TERT, TMEM127, TP53, TSC1, TSC2, VHL, WRN and WT1.  The report date is August 02, 2017.    08/24/2017 - 11/23/2017 Neo-Adjuvant Chemotherapy    chemotherapy consisting of doxorubicin / cyclophosphamide  in dose dense fashion x4 started 08/24/2017, completed 10/12/2017,  followed by paclitaxel / carboplatin  weekly              (a) carboplatin /paclitaxel  discontinued after 3 doses, stopped 11/23/2017, due to cytopenias             (b) post chemo MRI 12/04/2017 findings a complete imaging response   12/15/2017 Surgery   status post left lumpectomy and sentinel lymph node sampling showing a complete pathologic response (ypT0, ypN0)   02/07/2018 - 03/04/2018 Radiation Therapy   adjuvant radiation with Dr. Izell: Left breast 40.05 Gy in 15 fractions, boost: 10 Gy in 5 fractions     CNS Oncologic History 08/24/18: Undergoes SRS  for progressive L vestibular schwannoma at [facility]  Interval History: Stephanie Bautista presents today for follow up, now having completed steroids and rehab sessions.  She describes notable improvement in balance, gait.  Walking independently at this time.  Visual issues have worsened, she is scheduled for cataract surgery on both eyes next month.  Denies headaches.  H+P (11/08/23) Patient presents today for evaluation of dizziness, unsteady gait.  She and her husband describe sudden onset of imbalance, undsteady  gait without frank vertigo on 09/08/23.  This was preceded by severe headache, like a migraine which is unusual for her.  She did improve with her gait and balance over the next few days, but without return to prior baseline.  Even now almost two months later, she stays away from driving and is walking often with a cane.  Prior she was driving independently and walking multiple miles per day.  She has vestibular schwannoma that was treated at [facility] in 2020 with radiosurgery.  She continues to have hearing impairment in the left ear, follows with audiology.  Also has had both wrists fractured in past year, bilateral cataracts.  Medications: Current Outpatient Medications on File Prior to Visit  Medication Sig Dispense Refill   amLODipine  (NORVASC ) 5 MG tablet TAKE 1.5 TABLETS BY MOUTH DAILY. 135 tablet 1   ascorbic acid (VITAMIN C) 500 MG tablet Take 500 mg by mouth daily.     BIOTIN 5000 5 MG CAPS Take by mouth.     gatifloxacin (ZYMAXID) 0.5 % SOLN Place 1 drop into the right eye 4 (four) times daily.     Inulin-Calcium-Vitamin D 2-250-100 GM-MG-UNIT CHEW Chew by mouth daily.     ketorolac  (ACULAR ) 0.5 % ophthalmic solution Place 1 drop into the right eye 4 (four) times daily.     Multiple Vitamin (MULTI-VITAMIN) tablet Take 1 tablet by mouth daily.     Polyethylene Glycol 3350  (MIRALAX  PO) Take by mouth.     prednisoLONE acetate (PRED FORTE) 1 % ophthalmic suspension Place 1 drop into the right eye 4 (four) times daily.     senna-docusate (SENOKOT-S) 8.6-50 MG tablet Take 1 tablet by mouth daily. 30 tablet 0   meclizine  (ANTIVERT ) 12.5 MG tablet Take 1 tablet (12.5 mg total) by mouth 3 (three) times daily as needed for dizziness. (Patient not taking: Reported on 01/03/2024) 30 tablet 0   No current facility-administered medications on file prior to visit.    Allergies:  Allergies  Allergen Reactions   Amoxicillin Itching, Rash and Nausea Only   Past Medical History:  Past Medical  History:  Diagnosis Date   Acoustic neuroma (HCC)    Adenomatous colon polyp 12/2002   Arthritis    Breast cancer (HCC) 1996   right breast cancer in 1996, left breast cancer in 2019   Cancer (HCC)    Cholelithiases    Esophageal stricture    Family history of breast cancer    Family history of ovarian cancer    Family history of pancreatic cancer    Family history of prostate cancer    GERD (gastroesophageal reflux disease)    Hemorrhoids    Hiatal hernia    History of colon polyps    History of radiation therapy 02/07/18-03/04/2018   Left Breast/ 40.05 Gy in 15 fractions, Left Breast boost/ 10 Gy in 5 fractions.    Inguinal hernia    UTI (lower urinary tract infection)    Past Surgical History:  Past Surgical History:  Procedure  Laterality Date   accoustic neuroma     BREAST LUMPECTOMY     BREAST LUMPECTOMY WITH RADIOACTIVE SEED AND SENTINEL LYMPH NODE BIOPSY Left 12/15/2017   Procedure: LEFT BREAST LUMPECTOMY WITH RADIOACTIVE SEED AND LEFT SENTINEL LYMPH NODE BIOPSY, LEFT BREAST SEED GUIDED EXCISIONAL BIOPSY;  Surgeon: Ebbie Cough, MD;  Location: [facility];  Service: General;  Laterality: Left;   CESAREAN SECTION     2x   CHOLECYSTECTOMY  2008   HEMORRHOID SURGERY  2007   HEMORRHOID SURGERY N/A 07/03/2022   Procedure: HEMORRHOIDECTOMY;  Surgeon: Teresa Lonni HERO, MD;  Location: [facility];  Service: General;  Laterality: N/A;   INGUINAL HERNIA REPAIR  1968   PARTIAL HYSTERECTOMY  1996   PORT-A-CATH REMOVAL Right 12/15/2017   Procedure: REMOVAL PORT-A-CATH;  Surgeon: Ebbie Cough, MD;  Location: [facility];  Service: General;  Laterality: Right;   PORTACATH PLACEMENT Right 08/05/2017   Procedure: INSERTION PORT-A-CATH WITH US ;  Surgeon: Ebbie Cough, MD;  Location: [facility];  Service: General;  Laterality: Right;   RECTAL EXAM UNDER ANESTHESIA N/A 07/03/2022   Procedure: RECTAL EXAM UNDER  ANESTHESIA;  Surgeon: Teresa Lonni HERO, MD;  Location: [facility];  Service: General;  Laterality: N/A;   SHOULDER SURGERY Right 2005   torn muscle   TONSILLECTOMY     Social History:  Social History   Socioeconomic History   Marital status: Married    Spouse name: Not on file   Number of children: 2   Years of education: Not on file   Highest education level: 12th grade  Occupational History   Occupation: office    Employer: [employer]   Occupation: retired  Tobacco Use   Smoking status: Never   Smokeless tobacco: Never  Vaping Use   Vaping status: Never Used  Substance and Sexual Activity   Alcohol use: No   Drug use: No   Sexual activity: Not Currently    Birth control/protection: Surgical, Post-menopausal  Other Topics Concern   Not on file  Social History Narrative   Married   Social Drivers of Health   Financial Resource Strain: Low Risk  (10/26/2023)   Overall Financial Resource Strain (CARDIA)    Difficulty of Paying Living Expenses: Not hard at all  Food Insecurity: No Food Insecurity (10/26/2023)   Hunger Vital Sign    Worried About Running Out of Food in the Last Year: Never true    Ran Out of Food in the Last Year: Never true  Transportation Needs: No Transportation Needs (10/26/2023)   PRAPARE - Administrator, Civil Service (Medical): No    Lack of Transportation (Non-Medical): No  Physical Activity: Sufficiently Active (10/26/2023)   Exercise Vital Sign    Days of Exercise per Week: 6 days    Minutes of Exercise per Session: 120 min  Stress: Stress Concern Present (10/26/2023)   [employer] of Occupational Health - Occupational Stress Questionnaire    Feeling of Stress : To some extent  Social Connections: Socially Integrated (10/26/2023)   Social Connection and Isolation Panel    Frequency of Communication with Friends and Family: Three times a week    Frequency of Social Gatherings with Friends and Family: Once a week     Attends Religious Services: More than 4 times per year    Active Member of [employer] or Organizations: Yes    Attends Banker Meetings: More than 4 times per year  Marital Status: Married  Catering manager Violence: Not At Risk (10/26/2023)   Humiliation, Afraid, Rape, and Kick questionnaire    Fear of Current or Ex-Partner: No    Emotionally Abused: No    Physically Abused: No    Sexually Abused: No   Family History:  Family History  Problem Relation Age of Onset   Heart disease Mother    Melanoma Mother        dx in her 49s   Ovarian cancer Mother 28   Heart disease Father    Diabetes Father    Stroke Father    Ovarian cancer Sister 67   Non-Hodgkin's lymphoma Maternal Grandmother    Heart disease Maternal Grandfather    Heart disease Paternal Grandmother    Prostate cancer Paternal Grandfather    Pancreatic cancer Maternal Aunt    Cancer Maternal Aunt        NOS   Bone cancer Maternal Uncle    Stroke Maternal Uncle    Bone cancer Maternal Uncle    Breast cancer Paternal Aunt    Breast cancer Cousin        daughter of mat aunt with NOS cancer   Colon cancer Neg Hx    Esophageal cancer Neg Hx    Stomach cancer Neg Hx     Review of Systems: Constitutional: Doesn't report fevers, chills or abnormal weight loss Eyes: Doesn't report blurriness of vision Ears, nose, mouth, throat, and face: Doesn't report sore throat Respiratory: Doesn't report cough, dyspnea or wheezes Cardiovascular: Doesn't report palpitation, chest discomfort  Gastrointestinal:  Doesn't report nausea, constipation, diarrhea GU: Doesn't report incontinence Skin: Doesn't report skin rashes Neurological: Per HPI Musculoskeletal: Doesn't report joint pain Behavioral/Psych: Doesn't report anxiety  Physical Exam: Vitals:   01/03/24 0954 01/03/24 1004  BP: (!) 161/76 (!) 156/75  Pulse: 76   Resp: 20   Temp: (!) 97.5 F (36.4 C)   SpO2: 100%    KPS: 80. General: Alert, cooperative,  pleasant, in no acute distress Head: Normal EENT: No conjunctival injection or scleral icterus.  Lungs: Resp effort normal Cardiac: Regular rate Abdomen: Non-distended abdomen Skin: No rashes cyanosis or petechiae. Extremities: No clubbing or edema  Neurologic Exam: Mental Status: Awake, alert, attentive to examiner. Oriented to self and environment. Language is fluent with intact comprehension.  Cranial Nerves: Visual acuity is grossly normal. Visual fields are full. Extra-ocular movements intact. No ptosis. Face is symmetric Motor: Tone and bulk are normal. Power is full in both arms and legs. Reflexes are symmetric, no pathologic reflexes present.  Sensory: Intact to light touch Gait: Dystaxic   Labs: I have reviewed the data as listed    Component Value Date/Time   NA 141 10/19/2023 0846   K 3.8 10/19/2023 0846   CL 107 10/19/2023 0846   CO2 27 10/19/2023 0846   GLUCOSE 93 10/19/2023 0846   BUN 11 10/19/2023 0846   CREATININE 0.73 10/19/2023 0846   CREATININE 0.69 11/16/2011 1054   CALCIUM 9.7 10/19/2023 0846   PROT 7.2 10/19/2023 0846   ALBUMIN 4.5 10/19/2023 0846   AST 14 (L) 10/19/2023 0846   ALT 7 10/19/2023 0846   ALKPHOS 57 10/19/2023 0846   BILITOT 0.7 10/19/2023 0846   GFRNONAA >60 10/19/2023 0846   GFRAA >60 08/14/2019 0918   GFRAA >60 07/27/2017 1007   Lab Results  Component Value Date   WBC 4.6 10/19/2023   NEUTROABS 3.0 10/19/2023   HGB 14.1 10/19/2023   HCT 41.3 10/19/2023  MCV 85.5 10/19/2023   PLT 192 10/19/2023    Imaging:  1.  Unchanged size and appearance of a left vestibular schwannoma. 2.  The additional findings of an enhancing lesion in the right frontal calvarium and a heterogeneously enhancing pituitary lesion also appear unchanged. Narrative  MR TEMPORAL BONE/IAC (HI-RES) WWO CONTRAST, 09/02/2023 11:40 AM  INDICATION: vestibular evaluation, Benign neoplasm of cranial nerves (CMD) \ D33.3 Benign neoplasm of cranial nerves  (CMD) COMPARISON: Temporal bone MR 09/03/2022  TECHNIQUE: Multiplanar multisequence MR imaging of the brain was obtained before and after intravenous administration of gadolinium-based contrast, including axial and coronal thin-section images of the petrous temporal bones. By design, the brain was incompletely imaged.   FINDINGS:  Skull base/soft tissues: Unchanged T1 hypointense, enhancing lesion in the right frontal calvarium. Mastoid air cells and middle ears are normally aerated bilaterally.  Brainstem/cerebellum: No cerebellopontine angle mass.  Internal auditory canals/inner ears: Unchanged size and appearance of a heterogeneously enhancing lesion in the left internal auditory canal and cerebellopontine angle cistern, which measures 13 x 9 mm in transaxial dimensions (series 5, image 82). No new enhancing lesions identified.  Imaged brain: No evidence of acute infarct. No acute hemorrhage, mass effect, or hydrocephalus. Unchanged size and appearance of a T2 hyperintense, heterogeneously enhancing sellar lesion. Procedure Note  Drena Franky BIRCH, MD - 09/05/2023 Formatting of this note might be different from the original. MR TEMPORAL BONE/IAC (HI-RES) WWO CONTRAST, 09/02/2023 11:40 AM  INDICATION: vestibular evaluation, Benign neoplasm of cranial nerves (CMD) \ D33.3 Benign neoplasm of cranial nerves (CMD) COMPARISON: Temporal bone MR 09/03/2022  TECHNIQUE: Multiplanar multisequence MR imaging of the brain was obtained before and after intravenous administration of gadolinium-based contrast, including axial and coronal thin-section images of the petrous temporal bones. By design, the brain was incompletely imaged.   FINDINGS:  Skull base/soft tissues: Unchanged T1 hypointense, enhancing lesion in the right frontal calvarium. Mastoid air cells and middle ears are normally aerated bilaterally.  Brainstem/cerebellum: No cerebellopontine angle mass.  Internal auditory canals/inner ears:  Unchanged size and appearance of a heterogeneously enhancing lesion in the left internal auditory canal and cerebellopontine angle cistern, which measures 13 x 9 mm in transaxial dimensions (series 5, image 82). No new enhancing lesions identified.  Imaged brain: No evidence of acute infarct. No acute hemorrhage, mass effect, or hydrocephalus. Unchanged size and appearance of a T2 hyperintense, heterogeneously enhancing sellar lesion.   IMPRESSION:  1.  Unchanged size and appearance of a left vestibular schwannoma. 2.  The additional findings of an enhancing lesion in the right frontal calvarium and a heterogeneously enhancing pituitary lesion also appear unchanged. Exam End: 09/02/23 11:40   Specimen Collected: 09/05/23 12:50     Assessment/Plan Schwannoma of cranial nerve [facility]), left  Devynne B Goecke is clinically improved with her dizziness, ataxia following rehab sessions.  Currently she is limited by visual impairment secondary to bilateral cataracts.  Scheduled for cataract surgery early next month.   Prior: presents with clinical syndrome of dystaxia.  Etiology is unclear but may be multifactorial; known treated schwannoma, visual impairment, psychogenic unsteadiness.    Given acute onset and lack of full recovery over 2 months, as well as noted elevated blood pressure during presentation, there is some suspicion for posterior fossa infarct.  She only obtained a CT head on 4/18, last MRI was back on 09/02/23 at [facility].    Other possibility is inflammation, such as vestibular neuritis or delayed radiation treatment effect.  This would have also been likely  visible on an MRI.  Discussed and recommended trial of dexamethasone  4mg  daily x7 days.  If inflammatory etiology, we would expect to see some improvement.  If no improvement, stroke will be deemed more likely.  Would then consider repeat MRI brain and referral to neuro-PT for gait training.  Recommended continuing to follow with her  primary team at [facility] for schwannoma surveillance.  We are happy to see her in the interim with any new or recurrent complaints.  We appreciate the opportunity to participate in the care of Sheritta B Bevans.   All questions were answered. The patient knows to call the clinic with any problems, questions or concerns. No barriers to learning were detected.  The total time spent in the encounter was 30 minutes and more than 50% was on counseling and review of test results   Arthea MARLA Manns, MD Medical Director of Neuro-Oncology [facility] 01/03/24 11:47 AM

## 2024-01-04 ENCOUNTER — Encounter: Admitting: Physical Therapy

## 2024-01-06 DIAGNOSIS — H2511 Age-related nuclear cataract, right eye: Secondary | ICD-10-CM | POA: Diagnosis not present

## 2024-01-07 DIAGNOSIS — H2512 Age-related nuclear cataract, left eye: Secondary | ICD-10-CM | POA: Diagnosis not present

## 2024-01-17 ENCOUNTER — Ambulatory Visit: Admitting: Physical Therapy

## 2024-01-20 DIAGNOSIS — Z4789 Encounter for other orthopedic aftercare: Secondary | ICD-10-CM | POA: Diagnosis not present

## 2024-01-20 DIAGNOSIS — S52691D Other fracture of lower end of right ulna, subsequent encounter for closed fracture with routine healing: Secondary | ICD-10-CM | POA: Diagnosis not present

## 2024-01-20 DIAGNOSIS — S52609A Unspecified fracture of lower end of unspecified ulna, initial encounter for closed fracture: Secondary | ICD-10-CM | POA: Insufficient documentation

## 2024-02-03 DIAGNOSIS — S52691A Other fracture of lower end of right ulna, initial encounter for closed fracture: Secondary | ICD-10-CM | POA: Diagnosis not present

## 2024-02-03 DIAGNOSIS — Z4789 Encounter for other orthopedic aftercare: Secondary | ICD-10-CM | POA: Diagnosis not present

## 2024-02-10 DIAGNOSIS — H25042 Posterior subcapsular polar age-related cataract, left eye: Secondary | ICD-10-CM | POA: Diagnosis not present

## 2024-02-10 DIAGNOSIS — H2512 Age-related nuclear cataract, left eye: Secondary | ICD-10-CM | POA: Diagnosis not present

## 2024-02-10 DIAGNOSIS — H25012 Cortical age-related cataract, left eye: Secondary | ICD-10-CM | POA: Diagnosis not present

## 2024-02-17 DIAGNOSIS — S52691D Other fracture of lower end of right ulna, subsequent encounter for closed fracture with routine healing: Secondary | ICD-10-CM | POA: Diagnosis not present

## 2024-02-17 DIAGNOSIS — Z4789 Encounter for other orthopedic aftercare: Secondary | ICD-10-CM | POA: Diagnosis not present

## 2024-03-02 DIAGNOSIS — S52601D Unspecified fracture of lower end of right ulna, subsequent encounter for closed fracture with routine healing: Secondary | ICD-10-CM | POA: Diagnosis not present

## 2024-03-02 DIAGNOSIS — Z4789 Encounter for other orthopedic aftercare: Secondary | ICD-10-CM | POA: Diagnosis not present

## 2024-03-20 ENCOUNTER — Encounter: Payer: Self-pay | Admitting: Internal Medicine

## 2024-03-20 NOTE — Progress Notes (Unsigned)
 Subjective:    Patient ID: Stephanie Bautista, female    DOB: January 28, 1951, 73 y.o.   MRN: [id number]     HPI Elya is here for follow up of her chronic medical problems.  Hand OA -has significant arthritis in her hands that is bothersome-she has tried a few things topically that have only given temporary relief (arnocare topical, cbd topical).Drinks cherry juice, which helped a lot when she first started it, but it stopped working.  She is wondering what other things might help..    Fractured right arm.  She stepped in a hole in her backyard, lost her balance and hit her arm against a piece of furniture.  It has healed.   Balance -she is concerned about her balance is not as good.  Gets 10000 steps in a day.  Her and her husband do belong to a gym and they planned on going this week to start to get into working and going on a regular basis, but they have not been yet.  She knows she should be exercising regularly.  Concerned about her bone density  Medications and allergies reviewed with patient and updated if appropriate.  Current Outpatient Medications on File Prior to Visit  Medication Sig Dispense Refill   amLODipine  (NORVASC ) 5 MG tablet TAKE 1.5 TABLETS BY MOUTH DAILY. 135 tablet 1   Inulin-Calcium-Vitamin D 2-250-100 GM-MG-UNIT CHEW Chew by mouth daily.     Multiple Vitamin (MULTI-VITAMIN) tablet Take 1 tablet by mouth daily.     ascorbic acid (VITAMIN C) 500 MG tablet Take 500 mg by mouth daily. (Patient not taking: Reported on 03/21/2024)     BIOTIN 5000 5 MG CAPS Take by mouth. (Patient not taking: Reported on 03/21/2024)     Polyethylene Glycol 3350  (MIRALAX  PO) Take by mouth. (Patient not taking: Reported on 03/21/2024)     No current facility-administered medications on file prior to visit.     Review of Systems  Constitutional:  Negative for fever.  Respiratory:  Negative for cough, shortness of breath and wheezing.   Cardiovascular:  Negative for chest pain,  palpitations and leg swelling.  Neurological:  Negative for light-headedness and headaches.       Objective:   Vitals:   03/21/24 1024  BP: 124/80  Pulse: 70  Temp: 98 F (36.7 C)  SpO2: 99%   BP Readings from Last 3 Encounters:  03/21/24 124/80  01/03/24 (!) 156/75  11/01/23 130/66   Wt Readings from Last 3 Encounters:  03/21/24 131 lb (59.4 kg)  01/03/24 129 lb 12.8 oz (58.9 kg)  11/01/23 127 lb 11.2 oz (57.9 kg)   Body mass index is 22.49 kg/m.    Physical Exam Constitutional:      General: She is not in acute distress.    Appearance: Normal appearance.  HENT:     Head: Normocephalic and atraumatic.  Eyes:     Conjunctiva/sclera: Conjunctivae normal.  Cardiovascular:     Rate and Rhythm: Normal rate and regular rhythm.     Heart sounds: Normal heart sounds.  Pulmonary:     Effort: Pulmonary effort is normal. No respiratory distress.     Breath sounds: Normal breath sounds. No wheezing.  Musculoskeletal:     Cervical back: Neck supple.     Right lower leg: No edema.     Left lower leg: No edema.  Lymphadenopathy:     Cervical: No cervical adenopathy.  Skin:    General: Skin is warm and  dry.     Findings: No rash.  Neurological:     Mental Status: She is alert. Mental status is at baseline.  Psychiatric:        Mood and Affect: Mood normal.        Behavior: Behavior normal.        Lab Results  Component Value Date   WBC 4.6 10/19/2023   HGB 14.1 10/19/2023   HCT 41.3 10/19/2023   PLT 192 10/19/2023   GLUCOSE 93 10/19/2023   ALT 7 10/19/2023   AST 14 (L) 10/19/2023   NA 141 10/19/2023   K 3.8 10/19/2023   CL 107 10/19/2023   CREATININE 0.73 10/19/2023   BUN 11 10/19/2023   CO2 27 10/19/2023   TSH 1.640 10/19/2023     Assessment & Plan:    See Problem List for Assessment and Plan of chronic medical problems.

## 2024-03-21 ENCOUNTER — Encounter: Payer: Self-pay | Admitting: Internal Medicine

## 2024-03-21 ENCOUNTER — Ambulatory Visit (INDEPENDENT_AMBULATORY_CARE_PROVIDER_SITE_OTHER): Admitting: Internal Medicine

## 2024-03-21 VITALS — BP 124/80 | HR 70 | Temp 98.0°F | Ht 64.0 in | Wt 131.0 lb

## 2024-03-21 DIAGNOSIS — D333 Benign neoplasm of cranial nerves: Secondary | ICD-10-CM

## 2024-03-21 DIAGNOSIS — M810 Age-related osteoporosis without current pathological fracture: Secondary | ICD-10-CM | POA: Diagnosis not present

## 2024-03-21 DIAGNOSIS — I10 Essential (primary) hypertension: Secondary | ICD-10-CM

## 2024-03-21 MED ORDER — AMLODIPINE BESYLATE 5 MG PO TABS: ORAL_TABLET | ORAL | 1 refills | Status: AC

## 2024-03-21 NOTE — Assessment & Plan Note (Signed)
 Chronic Has osteoporosis and strongly recommended medication-would recommend Evenity which would help build up bone-discussed 1 injection monthly for 12 months and then would need to transition to either Prolia, Reclast or Fosamax Stressed regular exercise-ideally weightbearing cardio and weights Stressed 1200 mg of calcium a day in combination of supplements and diet Advised 2000 units of vitamin D daily Will check vitamin D level in 6 months

## 2024-03-21 NOTE — Patient Instructions (Addendum)
     Calcium 1200 mg a day in combination of supplements and food Vitamin D3  2000 units daily    Medications changes include :   None       Return in about 6 months (around 09/19/2024) for follow up.

## 2024-03-21 NOTE — Assessment & Plan Note (Addendum)
 Chronic Following with ENT at [facility] MRI of brain 08/2023 - stable appearance Has lost some hearing but still has 40-50% in the left ear Balance is not as good

## 2024-03-21 NOTE — Assessment & Plan Note (Signed)
Chronic Blood pressure well controlled Continue amlodipine 7.5 mg daily

## 2024-03-29 DIAGNOSIS — S52691A Other fracture of lower end of right ulna, initial encounter for closed fracture: Secondary | ICD-10-CM | POA: Diagnosis not present

## 2024-03-29 DIAGNOSIS — M79642 Pain in left hand: Secondary | ICD-10-CM | POA: Diagnosis not present

## 2024-03-29 DIAGNOSIS — M19042 Primary osteoarthritis, left hand: Secondary | ICD-10-CM | POA: Diagnosis not present

## 2024-03-29 DIAGNOSIS — Z4789 Encounter for other orthopedic aftercare: Secondary | ICD-10-CM | POA: Diagnosis not present

## 2024-03-29 DIAGNOSIS — M19041 Primary osteoarthritis, right hand: Secondary | ICD-10-CM | POA: Diagnosis not present

## 2024-03-29 DIAGNOSIS — M65321 Trigger finger, right index finger: Secondary | ICD-10-CM | POA: Diagnosis not present

## 2024-03-29 DIAGNOSIS — M79641 Pain in right hand: Secondary | ICD-10-CM | POA: Diagnosis not present

## 2024-05-01 DIAGNOSIS — M65321 Trigger finger, right index finger: Secondary | ICD-10-CM | POA: Diagnosis not present

## 2024-05-01 DIAGNOSIS — S52691D Other fracture of lower end of right ulna, subsequent encounter for closed fracture with routine healing: Secondary | ICD-10-CM | POA: Diagnosis not present

## 2024-09-19 ENCOUNTER — Ambulatory Visit: Admitting: Internal Medicine

## 2024-10-20 ENCOUNTER — Other Ambulatory Visit

## 2024-10-30 ENCOUNTER — Ambulatory Visit
